# Patient Record
Sex: Female | Born: 1960 | Race: White | Hispanic: No | Marital: Married | Smoking: Never smoker
Health system: Western US, Academic
[De-identification: ages and names within clinical notes are randomized; demographics above are authoritative.]

## PROBLEM LIST (undated history)

## (undated) DIAGNOSIS — D649 Anemia, unspecified: Secondary | ICD-10-CM

## (undated) DIAGNOSIS — M5137 Other intervertebral disc degeneration, lumbosacral region: Secondary | ICD-10-CM

## (undated) DIAGNOSIS — K219 Gastro-esophageal reflux disease without esophagitis: Secondary | ICD-10-CM

## (undated) DIAGNOSIS — M1990 Unspecified osteoarthritis, unspecified site: Secondary | ICD-10-CM

## (undated) DIAGNOSIS — J45909 Unspecified asthma, uncomplicated: Secondary | ICD-10-CM

## (undated) DIAGNOSIS — Z9889 Other specified postprocedural states: Secondary | ICD-10-CM

## (undated) DIAGNOSIS — E282 Polycystic ovarian syndrome: Secondary | ICD-10-CM

## (undated) DIAGNOSIS — M51379 Other intervertebral disc degeneration, lumbosacral region without mention of lumbar back pain or lower extremity pain: Secondary | ICD-10-CM

## (undated) DIAGNOSIS — T85192S Other mechanical complication of implanted electronic neurostimulator (electrode) of spinal cord, sequela: Secondary | ICD-10-CM

## (undated) DIAGNOSIS — J189 Pneumonia, unspecified organism: Secondary | ICD-10-CM

## (undated) DIAGNOSIS — N200 Calculus of kidney: Secondary | ICD-10-CM

## (undated) DIAGNOSIS — Z87442 Personal history of urinary calculi: Secondary | ICD-10-CM

## (undated) DIAGNOSIS — N289 Disorder of kidney and ureter, unspecified: Secondary | ICD-10-CM

## (undated) DIAGNOSIS — I10 Essential (primary) hypertension: Secondary | ICD-10-CM

## (undated) DIAGNOSIS — H35712 Central serous chorioretinopathy, left eye: Secondary | ICD-10-CM

## (undated) DIAGNOSIS — R011 Cardiac murmur, unspecified: Secondary | ICD-10-CM

## (undated) DIAGNOSIS — M1712 Unilateral primary osteoarthritis, left knee: Secondary | ICD-10-CM

## (undated) DIAGNOSIS — G500 Trigeminal neuralgia: Secondary | ICD-10-CM

## (undated) DIAGNOSIS — Z8489 Family history of other specified conditions: Secondary | ICD-10-CM

## (undated) DIAGNOSIS — D509 Iron deficiency anemia, unspecified: Secondary | ICD-10-CM

## (undated) DIAGNOSIS — R112 Nausea with vomiting, unspecified: Secondary | ICD-10-CM

## (undated) DIAGNOSIS — E119 Type 2 diabetes mellitus without complications: Secondary | ICD-10-CM

## (undated) DIAGNOSIS — R7303 Prediabetes: Secondary | ICD-10-CM

## (undated) DIAGNOSIS — M47816 Spondylosis without myelopathy or radiculopathy, lumbar region: Secondary | ICD-10-CM

## (undated) DIAGNOSIS — E079 Disorder of thyroid, unspecified: Secondary | ICD-10-CM

## (undated) DIAGNOSIS — Q615 Medullary cystic kidney: Secondary | ICD-10-CM

## (undated) DIAGNOSIS — E039 Hypothyroidism, unspecified: Secondary | ICD-10-CM

## (undated) DIAGNOSIS — Z961 Presence of intraocular lens: Secondary | ICD-10-CM

## (undated) DIAGNOSIS — M48061 Spinal stenosis, lumbar region without neurogenic claudication: Secondary | ICD-10-CM

## (undated) DIAGNOSIS — R0600 Dyspnea, unspecified: Secondary | ICD-10-CM

## (undated) DIAGNOSIS — J309 Allergic rhinitis, unspecified: Secondary | ICD-10-CM

## (undated) DIAGNOSIS — N390 Urinary tract infection, site not specified: Secondary | ICD-10-CM

## (undated) DIAGNOSIS — I82409 Acute embolism and thrombosis of unspecified deep veins of unspecified lower extremity: Secondary | ICD-10-CM

## (undated) HISTORY — DX: Acute embolism and thrombosis of unspecified deep veins of unspecified lower extremity (CMS-HCC): I82.409

## (undated) HISTORY — PX: OSTEOTOMY: SHX137

## (undated) HISTORY — DX: Urinary tract infection, site not specified: N39.0

## (undated) HISTORY — DX: Allergic rhinitis, unspecified: J30.9

## (undated) HISTORY — PX: ANTERIOR CRUCIATE LIGAMENT REPAIR: SHX115

## (undated) HISTORY — DX: Essential (primary) hypertension: I10

## (undated) HISTORY — PX: OTHER PROCEDURE: U1053

## (undated) HISTORY — PX: HYSTERECTOMY: SHX81

## (undated) HISTORY — DX: Calculus of kidney: N20.0

## (undated) HISTORY — DX: Medullary cystic kidney: Q61.5

## (undated) HISTORY — DX: Disorder of thyroid, unspecified: E07.9

## (undated) HISTORY — DX: Unspecified asthma, uncomplicated: J45.909

## (undated) HISTORY — DX: Pneumonia, unspecified organism: J18.9

## (undated) HISTORY — PX: KNEE SURGERY: SHX244

## (undated) HISTORY — DX: Trigeminal neuralgia: G50.0

## (undated) HISTORY — PX: SHOULDER SURGERY: SHX246

## (undated) HISTORY — PX: KNEE ARTHROSCOPY W/ ACL RECONSTRUCTION: SHX1858

## (undated) HISTORY — PX: FEMORAL OSTEOTOMY W/ APPLICATION EXTERNAL FIXATOR: SHX1587

## (undated) HISTORY — PX: APPENDECTOMY: SHX54

## (undated) HISTORY — PX: ABDOMINAL HYSTERECTOMY: SHX81

## (undated) HISTORY — PX: LITHOTRIPSY: SUR834

## (undated) HISTORY — PX: CATARACT EXTRACTION: SUR2

## (undated) HISTORY — PX: ESOPHAGOGASTRODUODENOSCOPY: SHX1529

## (undated) HISTORY — PX: ROTATOR CUFF REPAIR: SHX139

## (undated) HISTORY — PX: OTHER SURGICAL HISTORY: SHX169

## (undated) MED ORDER — ONDANSETRON HCL 4 MG/2ML IV SOLN
4.00 mg | Freq: Four times a day (QID) | INTRAMUSCULAR | Status: AC | PRN
Start: 2016-03-30 — End: ?

## (undated) MED ORDER — LACTATED RINGERS IV SOLN
INTRAVENOUS | Status: AC
Start: 2016-03-25 — End: ?

## (undated) MED ORDER — CARBAMAZEPINE 200 MG OR TABS
200.00 mg | ORAL_TABLET | Freq: Two times a day (BID) | ORAL | 0 refills | Status: AC
Start: 2016-11-23 — End: ?

## (undated) MED ORDER — LEVOTHYROXINE SODIUM 50 MCG OR TABS
ORAL_TABLET | ORAL | 0 refills | Status: AC
Start: 2018-02-01 — End: ?

## (undated) MED ORDER — LIDOCAINE HCL (PF) 1 % IJ SOLN
0.10 mL | INTRAMUSCULAR | Status: AC | PRN
Start: 2016-03-25 — End: ?

## (undated) MED ORDER — LEVOTHYROXINE SODIUM 50 MCG OR TABS
50.00 ug | ORAL_TABLET | Freq: Every day | ORAL | 0 refills | Status: AC
Start: 2016-11-23 — End: ?

## (undated) MED ORDER — CARBAMAZEPINE 200 MG OR TABS
ORAL_TABLET | ORAL | 1 refills | Status: AC
Start: 2019-11-16 — End: ?

## (undated) MED ORDER — CARBAMAZEPINE 200 MG OR TABS
ORAL_TABLET | ORAL | 0 refills | Status: AC
Start: 2018-03-28 — End: ?

## (undated) MED ORDER — LOSARTAN POTASSIUM-HCTZ 100-25 MG OR TABS
ORAL_TABLET | ORAL | 0 refills | Status: AC
Start: 2017-01-19 — End: ?

## (undated) MED ORDER — METFORMIN HCL 500 MG OR TB24
1000.00 mg | ORAL_TABLET | Freq: Every day | ORAL | 0 refills | Status: AC
Start: 2018-02-18 — End: ?

## (undated) MED ORDER — METFORMIN HCL 500 MG OR TB24
ORAL_TABLET | ORAL | Status: AC
Start: 2019-11-13 — End: ?

## (undated) MED ORDER — HYDROCHLOROTHIAZIDE 25 MG OR TABS
ORAL_TABLET | ORAL | 0 refills | Status: AC
Start: 2016-11-16 — End: ?

## (undated) MED ORDER — HYDROCHLOROTHIAZIDE 25 MG OR TABS
ORAL_TABLET | ORAL | 2 refills | Status: AC
Start: 2018-09-19 — End: ?

## (undated) MED ORDER — LEVOTHYROXINE SODIUM 50 MCG OR TABS
ORAL_TABLET | ORAL | 0 refills | Status: AC
Start: 2016-11-16 — End: ?

## (undated) MED ORDER — ESTRADIOL 0.05 MG/24HR TD PTTW
1.00 | MEDICATED_PATCH | TRANSDERMAL | 0 refills | Status: AC
Start: 2017-08-30 — End: ?

## (undated) MED ORDER — METFORMIN HCL 500 MG OR TB24
1000.00 mg | ORAL_TABLET | Freq: Every day | ORAL | 0 refills | Status: AC
Start: 2016-11-23 — End: ?

## (undated) MED ORDER — VIVELLE-DOT 0.05 MG/24HR TD PTTW
MEDICATED_PATCH | TRANSDERMAL | 0 refills | Status: AC
Start: 2018-06-14 — End: ?

## (undated) MED ORDER — AMLODIPINE 10 MG OR TABS
ORAL_TABLET | ORAL | 0 refills | Status: AC
Start: 2017-12-13 — End: ?

## (undated) MED ORDER — METFORMIN HCL 500 MG OR TB24
ORAL_TABLET | ORAL | 0 refills | Status: AC
Start: 2018-02-01 — End: ?

## (undated) MED ORDER — METFORMIN HCL 500 MG OR TB24
ORAL_TABLET | ORAL | 0 refills | Status: AC
Start: 2016-11-16 — End: ?

---
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
## 1999-10-21 ENCOUNTER — Ambulatory Visit: Payer: Self-pay | Admitting: Gastroenterology

## 2006-04-20 DIAGNOSIS — I82409 Acute embolism and thrombosis of unspecified deep veins of unspecified lower extremity: Secondary | ICD-10-CM

## 2006-04-20 HISTORY — DX: Acute embolism and thrombosis of unspecified deep veins of unspecified lower extremity: I82.409

## 2012-03-28 DIAGNOSIS — G500 Trigeminal neuralgia: Secondary | ICD-10-CM | POA: Insufficient documentation

## 2012-03-28 DIAGNOSIS — H5710 Ocular pain, unspecified eye: Secondary | ICD-10-CM | POA: Insufficient documentation

## 2012-03-28 DIAGNOSIS — H04123 Dry eye syndrome of bilateral lacrimal glands: Secondary | ICD-10-CM | POA: Insufficient documentation

## 2015-04-21 HISTORY — PX: OSTEOTOMY: SHX137

## 2015-11-04 DIAGNOSIS — M179 Osteoarthritis of knee, unspecified: Secondary | ICD-10-CM | POA: Insufficient documentation

## 2015-11-06 DIAGNOSIS — M1711 Unilateral primary osteoarthritis, right knee: Secondary | ICD-10-CM | POA: Insufficient documentation

## 2016-01-30 ENCOUNTER — Other Ambulatory Visit: Payer: Self-pay

## 2016-02-19 ENCOUNTER — Encounter (HOSPITAL_BASED_OUTPATIENT_CLINIC_OR_DEPARTMENT_OTHER): Payer: Self-pay | Admitting: Urology

## 2016-02-19 ENCOUNTER — Ambulatory Visit: Payer: BC Managed Care – PPO | Attending: Urology | Admitting: Urology

## 2016-02-19 DIAGNOSIS — N200 Calculus of kidney: Principal | ICD-10-CM | POA: Insufficient documentation

## 2016-02-19 LAB — UA, CHEM ONLY POCT
Bilirubin: NEGATIVE
Blood: NEGATIVE
Glucose: NEGATIVE
Ketones: NEGATIVE
Leuk Esterase: NEGATIVE
Nitrite: NEGATIVE
Protein: NEGATIVE
Specific Gravity: 1.02 (ref 1.002–1.030)
Urobilinogen: 0.2 (ref 0.2–1)
pH: 6 (ref 5.0–8.0)

## 2016-02-19 MED ORDER — LEVOTHYROXINE SODIUM PO
50.00 ug | Freq: Every day | ORAL | Status: DC
Start: ? — End: 2016-08-17

## 2016-02-19 MED ORDER — METFORMIN HCL PO
200.00 mg | Freq: Every day | ORAL | Status: DC
Start: ? — End: 2016-11-20

## 2016-02-19 MED ORDER — AMLODIPINE 5 MG OR TABS
5.00 mg | ORAL_TABLET | Freq: Every day | ORAL | Status: DC
Start: ? — End: 2017-11-15

## 2016-02-19 NOTE — Progress Notes (Signed)
UROLOGY NEW PATIENT STONE NOTE       Assessment/Plan:  Hx of medullary sponge kidney  Left flank pain    I have personally reviewed the images, radiology report, labs, and old records. I reviewed her outside CT but could not see all the images. Upload outside CT to [facility]. My cursory review does not suggest that PCNL is a good option due to tiny multiple stone burden. I will discuss w Dr. Shawn Stall [phone] and call pt back. Will also have her see my Dietician to review her diet and repeat Litholink (24 hour urine test). Fu w me 3 months    Plan for obtaining previous imaging, 24 hour urine collection and other.    Chief Complaint  - Nephrolithiasis     History of Present Illness  Melanie Walsh is a 55 year old female who is referred to me by Ellwood Dense here for evaluation and possible treatment of a kidney stone, which has been ongoing since 12/20/2015. She is currently symptomatic.    Pt has had long hx of stones and is being referred to me by Dr. Shawn Stall from [facility] for possible PCNL. Pt had multiple URS by him last Nov but stones recurred. He is concerned that ureters are becoming scarred.    She had Litholink (24 hour urine test) in past and was told that she has hyperoxaluria w dietary recs.  She admits to Vit D due to inability to tolerate dairy prodcuts (GI upset) but she can eat Yogurt.  Also takes [place] 1000mg  daily for health maintenance reasons.    Recently passed some stones from left side 2 weekends ago but cont to have pain. Denies F/C/N/V/Gross hematuria.    The stone was diagnosed upon work up for left flank pain. The patient has had CT abdomen/pelvis as part of her work up. not applicable - no medications were given was prescribed for medical expulsive therapy. The start date was NA.     Stone Passage/Treatment  - Outcome: reports that the stone passed.  - Days until outcome: N/A - not applicable.    - Treatment for this episode: No surgical intervention for this stone episode was  performed.    Stone History   - Prior Stone: stones since age 87-30  - Number of Stone Episodes: >6   - Laterality: experienced stones on both sides  - Prior Surgery: right ureteral stent placement, right ureteroscopy, right SWL, left ureteral stent placement, left ureteroscopy and left SWL.    Risk Factors  - Bariatric Surgery: no   - Urinary Reconstructive Surgery: no previous history of urinary reconstruction  - Anatomical Anomalies: no urinary tract anomaly.   - Family History: Yes     No flowsheet data found.    Allergies  Allergies   Allergen Reactions   . Latex Rash   . Morphine Shortness of Breath and Swelling   . Sulfa Drugs Rash     Medications    Current Outpatient Prescriptions:   .  amLODIPINE (NORVASC) 5 MG tablet, Take 5 mg by mouth daily., Disp: , Rfl:   .  LEVOTHYROXINE SODIUM PO, , Disp: , Rfl:   .  METFORMIN HCL PO, Take 200 mg by mouth daily., Disp: , Rfl:   Past Medical History  History reviewed. No pertinent past medical history.  Past Surgical History  No past surgical history on file.  Family History  Family History   Problem Relation Age of Onset   . Cancer Father    .  Heart Disease Father    . Hypertension Father    . Cancer Maternal Grandfather    . Cancer Paternal Grandmother    . Stroke Paternal Grandmother    . Cancer Paternal Grandfather    . Diabetes Paternal Grandfather      Social History  Social History     Social History   . Marital status: Single     Spouse name: N/A   . Number of children: N/A   . Years of education: N/A     Social History Main Topics   . Smoking status: Never Smoker   . Smokeless tobacco: None   . Alcohol use None   . Drug use: None   . Sexual activity: Not Asked     Social Activities of Daily Living Present   . None     Social History Narrative   . None       ROS: See pertinent positives and negatives in HPI. All other systems reviewed and found negative.    Physical Exam  Vitals:    02/19/16 0911   BP Patient Position: Sitting     GENERAL: The patient is an  alert, cooperative female in no acute distress.   HEENT: normalocephalic/atraumatic  NECK: midline trachea. No jugular venous distention  PULM: no evidence of labored breathing  GI: soft, NT, ND, no masses.  No hepatosplenomegaly  BACK:mild Left CVAT  EXTREMITIES: Joints are normal without redness or swelling.   SKIN: There is no edema or cyanosis.   NEURO: no motor or sensory deficits.  Alert and Oriented x 3      Sherrian Divers, MD

## 2016-02-21 ENCOUNTER — Telehealth (HOSPITAL_BASED_OUTPATIENT_CLINIC_OR_DEPARTMENT_OTHER): Payer: Self-pay | Admitting: Urology

## 2016-02-21 NOTE — Telephone Encounter (Signed)
I spoke w Dr. Shawn Stall yesterday that I would review images once they are uploaded to [facility].

## 2016-02-21 NOTE — Telephone Encounter (Signed)
I reviewed uploaded CT and she has 4 small right renal stones, 2 small left renal stones and a small Left UVJ stone.    Need to discuss w Dr Shawn Stall and then pt.

## 2016-02-21 NOTE — Telephone Encounter (Signed)
-----   Message from [facility] sent at 02/20/2016  2:25 PM PDT -----  Message left for Dr. Shawn Stall :)    Thanks,  Amado Nash   ----- Message -----     From: Sherrian Divers, MD     Sent: 02/19/2016  10:36 AM       To: Latricia Heft    Please see my note and give primary Urologist from [place] my cell.  I need to discuss case w him

## 2016-02-24 ENCOUNTER — Telehealth (HOSPITAL_BASED_OUTPATIENT_CLINIC_OR_DEPARTMENT_OTHER): Payer: Self-pay | Admitting: Urology

## 2016-02-24 NOTE — Telephone Encounter (Signed)
Routing to MD to advise.

## 2016-02-24 NOTE — Telephone Encounter (Signed)
Patient is calling stating that Dr. Reather Converse talked with her other doctor, Dr. Maureen Chatters, and they decided Dr. Reather Converse would be the one to preform the surgery on patient. Patient would like to remind Dr. Reather Converse that her goal is to do the surgery before the first of the year because she has already met her deductible for the year. Patient advised that Dr. Reather Converse has not placed the surgery order in her chart, but that once he did that, our surgery coordinator will be giving her a call to schedule. Please contact patient to discuss/update: [phone].

## 2016-02-25 ENCOUNTER — Telehealth (HOSPITAL_BASED_OUTPATIENT_CLINIC_OR_DEPARTMENT_OTHER): Payer: Self-pay | Admitting: Urology

## 2016-02-25 NOTE — Telephone Encounter (Signed)
Mailed out CT disc

## 2016-03-02 ENCOUNTER — Telehealth (HOSPITAL_BASED_OUTPATIENT_CLINIC_OR_DEPARTMENT_OTHER): Payer: Self-pay | Admitting: Urology

## 2016-03-02 NOTE — Telephone Encounter (Signed)
Patient calling regarding previous message of 02/24/16. Pt is requesting a return call to discuss surgery date. Please advise pt [phone]. Please contact pt after 2pm

## 2016-03-04 ENCOUNTER — Ambulatory Visit (HOSPITAL_BASED_OUTPATIENT_CLINIC_OR_DEPARTMENT_OTHER): Payer: BC Managed Care – PPO | Admitting: Registered"

## 2016-03-20 ENCOUNTER — Encounter (INDEPENDENT_AMBULATORY_CARE_PROVIDER_SITE_OTHER): Payer: Self-pay | Admitting: Urology

## 2016-03-20 ENCOUNTER — Other Ambulatory Visit: Payer: BC Managed Care – PPO | Attending: Urology | Admitting: Urology

## 2016-03-20 VITALS — BP 130/79 | HR 96 | Temp 99.0°F | Resp 16 | Ht 64.0 in | Wt 165.0 lb

## 2016-03-20 DIAGNOSIS — N200 Calculus of kidney: Principal | ICD-10-CM | POA: Insufficient documentation

## 2016-03-20 MED ORDER — VITAMIN D PO: ORAL | Status: AC

## 2016-03-20 MED ORDER — LEVALBUTEROL TARTRATE 45 MCG/ACT IN AERO
2.00 | INHALATION_SPRAY | Freq: Four times a day (QID) | RESPIRATORY_TRACT | Status: DC | PRN
Start: ? — End: 2016-10-26

## 2016-03-20 MED ORDER — HYDROCHLOROTHIAZIDE 50 MG OR TABS
50.00 mg | ORAL_TABLET | Freq: Every day | ORAL | Status: DC
Start: ? — End: 2016-08-17

## 2016-03-20 MED ORDER — ONE DAILY CALCIUM/IRON PO
ORAL | Status: DC
Start: ? — End: 2016-11-20

## 2016-03-20 NOTE — Progress Notes (Signed)
Urology Follow-up Stone Note    Assessment/Plan:  Hx of medullary sponge kidney  Left distal ureteral calculus, passed  Right flank pain    I have personally reviewed the images, radiology report, labs, and old records. Plan for Bilateral URS/litho.    Will use analog scope to potentially avoid stents.    Pt understands that she likely require perc nephrostomy tubes should she have post op renal colic (if no stent placed). She is fine w that.    Will need metabolic eval eventually.    Start Flomax 7days prior to surgery and send Ucx.  Explained the risks, benefits and alternatives (SWL/PNL). The risks include but are not limited to infection, urosepsis, bleeding, transfusion, ureteral perforation with need for nephrostomy tube, open conversion, need for repeat or ancillary procedure, ureteral stricture, cardiac events/MI, DVT/PE, CVA/stroke, pneumonia, and death.    surgery.     Chief Complaint  - Nephrolithiasis     HPI  Melanie Walsh is a 55 year old female with a history of urolithiasis who presents for routine follow-up. She passed her left stone in the ED but now has right flank pain.    Denies F/C/N/V/Gross hematuria    I spoke w Dr. Shawn Stall who is ok w my performing URS.      She reports one symptomatic stone event since her last visit. These were associated with right flank pain. Since her last visit, Jeneane underwent no interval procedures for stones. At their last visit, the plan was surgery.     [place] was started on no new medications at her  last visit. The patient reports n/a - no new medications were prescribed prescribed. Dietary changes recommended at the patient's last visit were No diet change. Dietary change adherence: She n/a - no dietary changes were recommended.    No flowsheet data found.     Allergies   Allergen Reactions   . Adhesive Tape Rash   . Latex Rash   . Morphine Shortness of Breath and Swelling   . Sulfa Drugs Rash     Current Outpatient Prescriptions   Medication Sig Dispense Refill   .  amLODIPINE (NORVASC) 5 MG tablet Take 5 mg by mouth daily.     . Cholecalciferol (VITAMIN D PO)      . hydrochlorothiazide (HYDRODIURIL) 50 MG tablet Take 50 mg by mouth daily.     Marland Kitchen levalbuterol (XOPENEX) 45 MCG/ACT inhaler Inhale 2 puffs by mouth every 6 hours as needed for Wheezing.     Marland Kitchen LEVOTHYROXINE SODIUM PO      . METFORMIN HCL PO Take 200 mg by mouth daily.     . Multiple Vitamins-Minerals (ONE DAILY CALCIUM/IRON PO)        No current facility-administered medications for this visit.      Past Medical History  History reviewed. No pertinent past medical history.  Past Surgical History  No past surgical history on file.  Family History  Family History   Problem Relation Age of Onset   . Cancer Father    . Heart Disease Father    . Hypertension Father    . Cancer Maternal Grandfather    . Cancer Paternal Grandmother    . Stroke Paternal Grandmother    . Cancer Paternal Grandfather    . Diabetes Paternal Grandfather      Social History  Social History     Social History   . Marital status: Single     Spouse name: N/A   .  Number of children: N/A   . Years of education: N/A     Social History Main Topics   . Smoking status: Never Smoker   . Smokeless tobacco: Never Used   . Alcohol use None   . Drug use: None   . Sexual activity: Not Asked     Social Activities of Daily Living Present   . None     Social History Narrative       There is no problem list on file for this patient.      ROS See pertinent positives and negatives in HPI. All other systems reviewed and found negative.     Objective  Physical Exam  Vitals:    03/20/16 1420   BP: 130/79   BP Location: Left arm   BP Patient Position: Sitting   BP cuff size: Regular   Pulse: 96   Resp: 16   Temp: 99 F (37.2 C)   TempSrc: Oral   Weight: 74.8 kg (165 lb)   Height: 5\' 4"  (1.626 m)     GENERAL: The patient is an alert, cooperative female in no acute distress.   HEENT: normalocephalic/atraumatic  NECK: midline trachea. No jugular venous distention  PULM: no  evidence of labored breathing  BACK: Right CVAT  EXTREMITIES: Joints are normal without redness or swelling.   SKIN: There is no edema or cyanosis.   NEURO: no motor or sensory deficits.  Alert and Oriented x 3    Sherrian Divers, MD

## 2016-03-20 NOTE — Telephone Encounter (Signed)
..    Surgery name: L URS    Patient has been contacted at Ph. #            . Spoke to patient and confirmed all surgery dates below. Surgical letter, bowel prep, and ASA forms have been emailed to ___ / mailed to address listed below per patients request.    Surgery Date:12/07   Check in time: 7:00am    [facility]: 12/06 phone     Is Medical Clearance needed?NO    Surgical Consent : has been signed and scanned into media    Will send message to MD and RN to review orders in EPIC to make sure appropriate orders are entered prior to pt's surgery. RN to also confirm with pt that they are aware of request.    Auth:  Will request as time gets closer per auth teams request.     I have informed the patient to please discontinue taking any aspirin/blood thinning medication and/or anti-inflammatory drugs at lease 7 days before their scheduled procedure.     Is the patient taking any aspirin/blood thinning medication that is prescribed under a physician's instructions? Yes/No       ASPIRIN AND IBUPROFEN CAUTION SHEET  FOR SURGERY PATIENTS    For 7 days prior to surgery, do not take any medication containing aspirin or ibuprofen.  Please refer to the list below for some of the medications that contain aspirin and/or ibuprofen.  If it is necessary for you to take any medication, please use Tylenol or acetaminophen substitutes.    Advil    Gingkoba  Alka-Seltzer   Liquiprin  Alleve    Measurin  Anacin     Midol  APC     Motrin  ASA compound    Norgesic  Ascriptin    Novahistine with APC  Aspergum    Nuprin  Bufferin    PAC  CAMA    Percodan  Capron capsules   Phenaphen  Contact    Phensol  Cope     Plavix  Coricidin   Robaxisal  Counterpain    Sal-Fayne  Daprisal    Stanback  Darvon compound   Super Anahist  Dolene compound   Synalogos  Dristan     Talwin compound  Ecotrin     Trigesic  Edrisal     Triphen  Equagesic    Trilisate  Excedrin    Triaminic  Femcaps    Vanquish  Fiorinal    Vitamin E (or any other multi-vitamin)  Gingkoba      Zactirin    This list does not include every medication that contains aspirin or ibuprofen.  Before taking any medication prior to or after surgery, please read the label carefully for the active ingredients aspirin, salicylates, and/or ibuprofen.  If the medication contains these ingredients do not use.  Please inform your physician of all medications you are taking, including non-prescription medications.    PRE-OPERATIVE NPO BOWEL PREPARATION    After 12:00 pm (noon) on the day before surgery, please drink only CLEAR LIQUIDS (NO dairy products) such as broth, plain jello, cranberry juice, apple juice, tea or water.    As soon as you wake up on the [place], please have a bowel movement if possible before you leave for the hospital.     Take your regular medications on the [place] (except Aspirin, Coumadin or Plavix).  If you regularly take any medication in the morning, especially insulin or other  oral medication for diabetes you must discuss this with your doctor.  Please make sure that your doctor approves all the medications that you are taking.    DO NOT eat or drink anything after midnight on the night before your surgery.    REMEMBER: You cannot take aspirin or other similar medications (Motrin, Ibuprofen, Naprosen) or blood thinners (coumadin, plavix) for 1 week prior to this procedure.      If you have any questions about this Bowel Preparation, please call  The   [facility] Nurse Case Manager at # [phone]      PRE-OPERATIVE INSTRUCTIONS     If there is any change in your surgery time, you will be contacted by the operating room scheduling team after 5:00 p.m. the day before your surgery   1. For 7 days prior to surgery, please do not take any medications that contain aspirin, ibuprofen, or any non-steroidal anti-inflammatory medications.  If you must take pain medication, please take Tylenol or an acetaminophen substitute.  2. If you take prescription medications, check  with the Anesthesiologist or the nurse in the Evaluation Center about whether you should take your medications on the day of surgery, especially blood pressure and heart medications.    3. If you smoke, do not smoke for at least 24 hours prior to surgery.  Please be aware that [facility] is a non-smoking facility.  4. Wear comfortable, loose clothing to the hospital.  5. Leave all valuables at home.  This includes jewelry, credit cards, money (except for copayment for discharge medications).  6. ALL jewelry must be removed, including rings, earrings, necklaces, navel rings, etc.  7. If you wear contact lenses or glasses, bring a case for them.  Contact lenses must be removed prior to surgery.  8. Bring your insurance carrier cards and picture I.D. with you.  If your insurance plan is accepted at our [place] and you would like to have your discharge prescriptions filled there, you will need to bring money for your co-payment.  9. Please arrange for transportation home after your surgery and hospital stay. A taxi or shuttle bus is not acceptable! You will need to be accompanied home by a responsible adult.  You may want to have a pillow and/or blanket in the car for the ride home.  10. If you will be staying in the hospital, bring a robe, slippers and any items for grooming that you may need during your stay.   11. Please call your doctor if you have any questions regarding your surgery or if you develop any signs or symptoms of illness (fever, runny nose, cough, sore throat).    NOTE:   Your surgery may have to be cancelled if you:    1) You do not follow the fasting/bowel prep guidelines    2) You arrive late for check in at the hospital  3) You have not arranged for a responsible adult to accompany you home

## 2016-03-22 LAB — URINE CULTURE: Urine Culture Result: NO GROWTH

## 2016-03-25 ENCOUNTER — Ambulatory Visit (HOSPITAL_BASED_OUTPATIENT_CLINIC_OR_DEPARTMENT_OTHER): Payer: BC Managed Care – PPO | Admitting: Family

## 2016-03-25 ENCOUNTER — Encounter (HOSPITAL_BASED_OUTPATIENT_CLINIC_OR_DEPARTMENT_OTHER): Payer: Self-pay | Admitting: Family

## 2016-03-25 ENCOUNTER — Ambulatory Visit (HOSPITAL_BASED_OUTPATIENT_CLINIC_OR_DEPARTMENT_OTHER): Payer: BC Managed Care – PPO | Admitting: Registered"

## 2016-03-25 DIAGNOSIS — Z01818 Encounter for other preprocedural examination: Principal | ICD-10-CM

## 2016-03-25 MED ORDER — FLUTICASONE-SALMETEROL 100-50 MCG/DOSE IN MISC
1.00 | INHALATION_SPRAY | Freq: Two times a day (BID) | RESPIRATORY_TRACT | Status: DC
Start: ? — End: 2017-04-21

## 2016-03-25 NOTE — Anesthesia Preprocedure Evaluation (Addendum)
ANESTHESIA PRE-OPERATIVE EVALUATION    Patient Information    Name: Melanie Walsh    MRN: [id number]    DOB: 05/08/1960    Age: 55 year old    Sex: female  Procedure(s):  LT URETEROSCOPY STONE EXTRACTION       There were no vitals taken for this visit.        Primary language spoken:  English    ROS/Medical History:    Patient summary reviewed    General Review & History of Anesthetic Complications:    - negative ROS    Pt has had GA before; no problems      TELEPHONE INTERVIEW-NEEDS PHYSICAL Cardiovascular:    Exercise tolerance: >4 METS (Treadmill 3 x wk 1 hr,     Denies CP SOB with physical exertion  )  (+)    hypertension           Pulmonary:     (+) asthma ( stable on inhalers, no admission or intubations), , , , ,    Hematology/Oncology:       ROS Comments  Hx of DVT 2007 -resolved     Neuro/Psych:        (+) neuromuscular disease (Tigeminal neuralgia,  left side no numbenss ),      Infectious Disease:   Negative ROS         Endo/Other:      (+) hypothyroidism ( replacement. last dose change 2010),     ROS Comments: Pre DM- on Metformin A1c ordered dos   GI/Hepatic:     (+) GERD ( PRN OTC) well controlled,    Renal:           ROS comments Kidney stone- procedure pending    Hx Medullary Kidney disease       Pregnancy History:             Pre Anesthesia Testing (PCC/CPC) notes/comments:    [facility] Test & records reviewed by [facility] provider    :                    Physical Exam    Airway:  Inter-inciser distance > 4 cm  Prognanth Able    Mallampati: II  Neck ROM: full  TM distance: 5-6 cm  Short thick neck: No        Cardiovascular:  - cardiovascular exam normal         Pulmonary:  - pulmonary exam normal           Neuro/Neck/Skeletal/Skin:  - [place] ANE PHYS EXAM NEGATIVE ROS SKIN SKELETAL NEURO NECK          Dental:      Abdominal:   - normal exam         Additional Clinical Notes:               Last  OSA (STOP BANG) Score:  No Data Recorded    Last OSA  (STOP) Score for   No Data Recorded                 No past medical  history on file.  No past surgical history on file.  Social History   Substance Use Topics   . Smoking status: Never Smoker   . Smokeless tobacco: Never Used   . Alcohol use Not on file       Current Outpatient Prescriptions   Medication Sig Dispense Refill   . amLODIPINE (NORVASC) 5 MG tablet Take  5 mg by mouth daily.     . Cholecalciferol (VITAMIN D PO)      . hydrochlorothiazide (HYDRODIURIL) 50 MG tablet Take 50 mg by mouth daily.     Marland Kitchen levalbuterol (XOPENEX) 45 MCG/ACT inhaler Inhale 2 puffs by mouth every 6 hours as needed for Wheezing.     Marland Kitchen LEVOTHYROXINE SODIUM PO      . METFORMIN HCL PO Take 200 mg by mouth daily.     . Multiple Vitamins-Minerals (ONE DAILY CALCIUM/IRON PO)        No current facility-administered medications for this visit.      Allergies   Allergen Reactions   . Adhesive Tape Rash   . Latex Rash   . Morphine Shortness of Breath and Swelling   . Sulfa Drugs Rash       Labs and Other Data  No results found for: NA, K, CL, BICARB, BUN, CREAT, GLU, [place]  No results found for: AST, ALT, GGT, LDH, ALK, TP, ALB, TBILI, DBILI  No results found for: WBC, RBC, HGB, HCT, MCV, MCHC, RDW, PLT, MPV, SEG, LYMPHS, MONOS, EOS, BASOS  No results found for: INR, PTT  No results found for: ARTPH, ARTPO2, ARTPCO2    Anesthesia Plan:  Risks and Benefits of Anesthesia  I personally examined the patient immediately prior to the anesthetic and reviewed the pertinent medical history, drug and allergy history, laboratory and imaging studies and consultations. I have determined that the patient has had adequate assessment and testing.    Anesthetic techniques, invasive monitors, anesthetic drugs for induction, maintenance and post-operative analgesia, risks and alternatives have been explained to the patient and/or patient's representatives.    I have prescribed the anesthetic plan:         Planned anesthesia method: General         ASA 2 (Mild systemic disease)     Potential anesthesia problems identified and risks  including but not limited to the following were discussed with patient and/or patient's representative: Adverse or allergic drug reaction, Dental injury or sore throat, Nerve injury and Recall    Planned monitoring method: Routine monitoring    Informed Consent:  Anesthetic plan and risks discussed with Patient.    Plan discussed with CRNA, Surgeon and Attending.

## 2016-03-25 NOTE — Patient Instructions (Addendum)
PREOPERATIVE SURGICAL INFORMATION TELEPHONE INTERVIEW    Your surgery is currently scheduled at [facility] hospital/facility/department on 03/26/16  With a planned report time of 10:50 AM      [facility], [place], [place], [place], [place] [address]   Please check in at [employer] [facility] [employer], [place], 1st floor.      QUESTIONS  If you have any questions between now and the day of surgery, please do not hesitate to call:     [facility] Preoperative [place]: [place] ARRIVAL TIME:  On the day of your Surgery/Procedure, please arrive at the time and location provided by your Surgeon/PreOp Team.  If you have any questions regarding your arrival time, please call:     Preoperative Surgical Admissions at [facility]: [phone]        MEDICATION INSTRUCTIONS:     MEDICATIONS TO STOP 7 DAYS BEFORE SURGERY/PROCEDURE:   PLEASE HOLD ASPIRIN AND ALL NSAIDS (non-steroidal anti-inflammatory drugs) or similar drugs SUCH AS advil, aleve, motrin, ibuprofen, relafen, lodine, feldene, Diclofenac, voltaren, indomethacin, naproxen, celebrex, Mobic.    Tylenol (Acetaminophen) products are safe if needed   PLEASE HOLD: vitamins, supplements, herbs & fish oil.      REGULAR PRESCRIPTION MEDICATIONS:   Regular prescription medications SHOULD be taken on the day of surgery with sips of water EXCEPT METFORMIN   AFTER YOUR VISIT WITH [place], IF YOU START TAKING A NEW MEDICATION BEFORE SURGERY, PLEASE CALL us TO MAKE SURE IT IS SAFE TO TAKE & WON'T EFFECT YOUR SURGERY.      TO DO LIST:     SLEEP APNEA: DO NOT use sedatives or drink alcohol while on pain medications & IF you use a CPAP machine bring your mask & Tubing with you the day of surgery.     EATING/DRINKING   PLEASE DO NOT EAT OR DRINK ANYTHING AFTER MIDNIGHT THE NIGHT BEFORE SURGERY.    Preparing for your Surgery:   Please wear clean loose-fitting clothes.   Bring a picture ID and your  insurance card, and be prepared to pay your deductible or co-insurance by cash, check, or credit card when you arrive.   If you are going home after surgery, please make sure to arrange for an ADULT to drive you home. If you do not do so, your surgery may be cancelled.    On The Day of Your Surgery:    Check in at the location mentioned above.   You will meet your Anesthesia and surgery teams before surgery   Once surgery is over, you will wake up in the recovery room, where you will be able to see your friends/family.   Once your time in the recovery room is complete, you will either go home or be admitted as planned.   If you go home, someone will need to stay with you for the first 24 hours after surgery.     Additional information about what to expect before & after surgery is available online at:  [email or web address]    Or by searching "You-tube" for [place] before surgery and [place] after surgery    You medical records are available to you at [email or web address]  Select create account.

## 2016-03-26 ENCOUNTER — Ambulatory Visit (HOSPITAL_COMMUNITY): Payer: BC Managed Care – PPO | Admitting: Family

## 2016-03-26 ENCOUNTER — Encounter (HOSPITAL_COMMUNITY): Admission: RE | Disposition: A | Discharge status: Home Health | Payer: Self-pay | Attending: Urology

## 2016-03-26 ENCOUNTER — Observation Stay
Admission: RE | Admit: 2016-03-26 | Discharge: 2016-03-27 | Disposition: A | Discharge status: Home / Self Care | Payer: BC Managed Care – PPO | Attending: Urology | Admitting: Urology

## 2016-03-26 ENCOUNTER — Ambulatory Visit (HOSPITAL_BASED_OUTPATIENT_CLINIC_OR_DEPARTMENT_OTHER): Payer: BC Managed Care – PPO | Admitting: Certified Registered"

## 2016-03-26 ENCOUNTER — Ambulatory Visit (HOSPITAL_BASED_OUTPATIENT_CLINIC_OR_DEPARTMENT_OTHER): Payer: BC Managed Care – PPO

## 2016-03-26 DIAGNOSIS — N200 Calculus of kidney: Secondary | ICD-10-CM | POA: Insufficient documentation

## 2016-03-26 DIAGNOSIS — J45909 Unspecified asthma, uncomplicated: Secondary | ICD-10-CM | POA: Insufficient documentation

## 2016-03-26 DIAGNOSIS — Z87442 Personal history of urinary calculi: Secondary | ICD-10-CM | POA: Insufficient documentation

## 2016-03-26 DIAGNOSIS — E039 Hypothyroidism, unspecified: Secondary | ICD-10-CM | POA: Insufficient documentation

## 2016-03-26 DIAGNOSIS — Q615 Medullary cystic kidney: Secondary | ICD-10-CM | POA: Insufficient documentation

## 2016-03-26 DIAGNOSIS — I10 Essential (primary) hypertension: Secondary | ICD-10-CM | POA: Insufficient documentation

## 2016-03-26 DIAGNOSIS — Z86718 Personal history of other venous thrombosis and embolism: Secondary | ICD-10-CM | POA: Insufficient documentation

## 2016-03-26 DIAGNOSIS — Z833 Family history of diabetes mellitus: Secondary | ICD-10-CM | POA: Insufficient documentation

## 2016-03-26 LAB — GLUCOSE (POCT): Glucose (POCT): 111 mg/dL — ABNORMAL HIGH (ref 70–99)

## 2016-03-26 SURGERY — REMOVAL, CALCULUS, URETER, URETEROSCOPIC
Site: Ureter | Laterality: Left | Wound class: Class II (Clean Contaminated)

## 2016-03-26 MED ORDER — IOHEXOL 240 MG/ML IJ SOLN
INTRAMUSCULAR | Status: DC | PRN
Start: 2016-03-26 — End: 2016-03-26
  Administered 2016-03-26: 16 mL via INTRAMUSCULAR

## 2016-03-26 MED ORDER — DIPHENHYDRAMINE HCL 50 MG/ML IJ SOLN
12.5000 mg | Freq: Once | INTRAMUSCULAR | Status: DC | PRN
Start: 2016-03-26 — End: 2016-03-26

## 2016-03-26 MED ORDER — SODIUM CHLORIDE 0.9 % IV SOLN
100.0000 mg | INTRAVENOUS | Status: DC | PRN
Start: 2016-03-26 — End: 2016-03-26
  Administered 2016-03-26: 250 mg via INTRAVENOUS

## 2016-03-26 MED ORDER — NALOXONE HCL 0.4 MG/ML IJ SOLN
0.1000 mg | INTRAMUSCULAR | Status: DC | PRN
Start: 2016-03-26 — End: 2016-03-27

## 2016-03-26 MED ORDER — HYDROCHLOROTHIAZIDE 25 MG OR TABS
50.0000 mg | ORAL_TABLET | Freq: Every day | ORAL | Status: DC
Start: 2016-03-27 — End: 2016-03-27

## 2016-03-26 MED ORDER — DIPHENHYDRAMINE HCL 50 MG/ML IJ SOLN
INTRAMUSCULAR | Status: DC | PRN
Start: 2016-03-26 — End: 2016-03-26
  Administered 2016-03-26: 25 mg via INTRAVENOUS

## 2016-03-26 MED ORDER — HYDROMORPHONE HCL 1 MG/ML IJ SOLN
0.5000 mg | INTRAMUSCULAR | Status: DC | PRN
Start: 2016-03-26 — End: 2016-03-26

## 2016-03-26 MED ORDER — FAMOTIDINE 20 MG/2ML IV SOLN
INTRAVENOUS | Status: DC | PRN
Start: 2016-03-26 — End: 2016-03-26
  Administered 2016-03-26: 20 mg via INTRAVENOUS

## 2016-03-26 MED ORDER — ACETAMINOPHEN 10 MG/ML IV SOLN
INTRAVENOUS | Status: DC | PRN
Start: 2016-03-26 — End: 2016-03-26
  Administered 2016-03-26: 1000 mg via INTRAVENOUS

## 2016-03-26 MED ORDER — FENTANYL CITRATE (PF) 100 MCG/2ML IJ SOLN
50.0000 ug | INTRAMUSCULAR | Status: DC | PRN
Start: 2016-03-26 — End: 2016-03-26
  Administered 2016-03-26 (×2): 50 ug via INTRAVENOUS

## 2016-03-26 MED ORDER — ONDANSETRON HCL 4 MG/2ML IV SOLN
4.0000 mg | Freq: Four times a day (QID) | INTRAMUSCULAR | Status: DC | PRN
Start: 2016-03-26 — End: 2016-03-27
  Administered 2016-03-27 (×2): 4 mg via INTRAVENOUS
  Filled 2016-03-26 (×2): qty 2

## 2016-03-26 MED ORDER — TRAMADOL HCL 50 MG OR TABS
50.0000 mg | ORAL_TABLET | Freq: Four times a day (QID) | ORAL | 0 refills | Status: DC | PRN
Start: 2016-03-26 — End: 2016-10-26

## 2016-03-26 MED ORDER — KETOROLAC TROMETHAMINE 15 MG/ML IJ SOLN
15.0000 mg | Freq: Once | INTRAMUSCULAR | Status: DC
Start: 2016-03-26 — End: 2016-03-26

## 2016-03-26 MED ORDER — ALBUTEROL SULFATE 108 (90 BASE) MCG/ACT IN AERS
2.0000 | INHALATION_SPRAY | Freq: Four times a day (QID) | RESPIRATORY_TRACT | Status: DC | PRN
Start: 2016-03-26 — End: 2016-03-27

## 2016-03-26 MED ORDER — OXYCODONE HCL 5 MG OR TABS
5.0000 mg | ORAL_TABLET | ORAL | Status: DC | PRN
Start: 2016-03-26 — End: 2016-03-27

## 2016-03-26 MED ORDER — PROPOFOL IV BOLUS 10 MG/ML
INTRAVENOUS | Status: DC | PRN
Start: 2016-03-26 — End: 2016-03-26
  Administered 2016-03-26: 130 mg via INTRAVENOUS

## 2016-03-26 MED ORDER — KETOROLAC TROMETHAMINE 15 MG/ML IJ SOLN
15.00 mg | Freq: Once | INTRAMUSCULAR | Status: AC
Start: 2016-03-26 — End: 2016-03-26
  Administered 2016-03-26: 15 mg via INTRAVENOUS
  Filled 2016-03-26: qty 1

## 2016-03-26 MED ORDER — AMLODIPINE 5 MG OR TABS
5.0000 mg | ORAL_TABLET | Freq: Every day | ORAL | Status: DC
Start: 2016-03-27 — End: 2016-03-27
  Filled 2016-03-26: qty 1

## 2016-03-26 MED ORDER — KETOROLAC TROMETHAMINE 30 MG/ML IJ SOLN
30.0000 mg | Freq: Four times a day (QID) | INTRAMUSCULAR | Status: DC
Start: 2016-03-27 — End: 2016-03-27
  Administered 2016-03-26 – 2016-03-27 (×2): 30 mg via INTRAVENOUS
  Filled 2016-03-26 (×2): qty 1

## 2016-03-26 MED ORDER — ONDANSETRON HCL 4 MG/2ML IV SOLN
INTRAMUSCULAR | Status: DC | PRN
Start: 2016-03-26 — End: 2016-03-26
  Administered 2016-03-26: 8 mg via INTRAVENOUS

## 2016-03-26 MED ORDER — FENTANYL CITRATE (PF) 100 MCG/2ML IJ SOLN
25.0000 ug | INTRAMUSCULAR | Status: DC | PRN
Start: 2016-03-26 — End: 2016-03-26

## 2016-03-26 MED ORDER — MOMETASONE FURO-FORMOTEROL FUM 100-5 MCG/ACT IN AERO
1.0000 | INHALATION_SPRAY | Freq: Two times a day (BID) | RESPIRATORY_TRACT | Status: DC
Start: 2016-03-26 — End: 2016-03-27
  Filled 2016-03-26: qty 8.8

## 2016-03-26 MED ORDER — SODIUM CHLORIDE 0.9 % IV SOLN
12.5000 mg | Freq: Once | INTRAVENOUS | Status: DC | PRN
Start: 2016-03-26 — End: 2016-03-26

## 2016-03-26 MED ORDER — FENTANYL CITRATE (PF) 100 MCG/2ML IJ SOLN
25.00 ug | INTRAMUSCULAR | Status: AC | PRN
Start: 2016-03-26 — End: 2016-03-26

## 2016-03-26 MED ORDER — DOCUSATE SODIUM 250 MG OR CAPS
250.0000 mg | ORAL_CAPSULE | Freq: Two times a day (BID) | ORAL | 0 refills | Status: DC | PRN
Start: 2016-03-26 — End: 2016-07-17

## 2016-03-26 MED ORDER — LACTATED RINGERS IV SOLN
INTRAVENOUS | Status: DC
Start: 2016-03-26 — End: 2016-03-26

## 2016-03-26 MED ORDER — OXYCODONE HCL 10 MG OR TABS
10.0000 mg | ORAL_TABLET | ORAL | Status: DC | PRN
Start: 2016-03-26 — End: 2016-03-27
  Administered 2016-03-26 – 2016-03-27 (×2): 10 mg via ORAL
  Filled 2016-03-26 (×2): qty 1

## 2016-03-26 MED ORDER — ONDANSETRON HCL 4 MG/2ML IV SOLN
4.0000 mg | Freq: Once | INTRAMUSCULAR | Status: DC | PRN
Start: 2016-03-26 — End: 2016-03-26

## 2016-03-26 MED ORDER — ACETAMINOPHEN 10 MG/ML IV SOLN
1000.0000 mg | Freq: Four times a day (QID) | INTRAVENOUS | Status: DC
Start: 2016-03-26 — End: 2016-03-27
  Administered 2016-03-26 – 2016-03-27 (×2): 1000 mg via INTRAVENOUS
  Filled 2016-03-26 (×4): qty 100

## 2016-03-26 MED ORDER — FENTANYL CITRATE (PF) 250 MCG/5ML IJ SOLN
INTRAMUSCULAR | Status: DC | PRN
Start: 2016-03-26 — End: 2016-03-26
  Administered 2016-03-26 (×3): 25 ug via INTRAVENOUS
  Administered 2016-03-26: 50 ug via INTRAVENOUS
  Administered 2016-03-26: 25 ug via INTRAVENOUS
  Administered 2016-03-26: 50 ug via INTRAVENOUS

## 2016-03-26 MED ORDER — LACTATED RINGERS IV SOLN
INTRAVENOUS | Status: DC | PRN
Start: 2016-03-26 — End: 2016-03-26
  Administered 2016-03-26: 12:00:00 via INTRAVENOUS

## 2016-03-26 MED ORDER — TAMSULOSIN HCL 0.4 MG PO CAPS
0.4000 mg | ORAL_CAPSULE | Freq: Every day | ORAL | Status: DC
Start: 2016-03-27 — End: 2016-03-27
  Administered 2016-03-27: 0.4 mg via ORAL
  Filled 2016-03-26: qty 1

## 2016-03-26 MED ORDER — NALOXONE HCL 0.4 MG/ML IJ SOLN
0.1000 mg | INTRAMUSCULAR | Status: DC | PRN
Start: 2016-03-26 — End: 2016-03-26

## 2016-03-26 MED ORDER — AMPICILLIN SODIUM 1 GM IV SOLR
INTRAVENOUS | Status: DC | PRN
Start: 2016-03-26 — End: 2016-03-26
  Administered 2016-03-26: 1000 mg via INTRAMUSCULAR

## 2016-03-26 MED ORDER — SODIUM CHLORIDE 0.9 % IV SOLN
INTRAVENOUS | Status: DC
Start: 2016-03-26 — End: 2016-03-27
  Administered 2016-03-26 – 2016-03-27 (×2): via INTRAVENOUS

## 2016-03-26 MED ORDER — HYDROMORPHONE HCL 1 MG/ML IJ SOLN
1.0000 mg | INTRAMUSCULAR | Status: DC | PRN
Start: 2016-03-26 — End: 2016-03-27
  Administered 2016-03-27 (×2): 1 mg via INTRAVENOUS
  Filled 2016-03-26 (×2): qty 1

## 2016-03-26 MED ORDER — DOCUSATE SODIUM 250 MG OR CAPS
250.0000 mg | ORAL_CAPSULE | Freq: Every day | ORAL | Status: DC
Start: 2016-03-27 — End: 2016-03-27
  Administered 2016-03-27: 250 mg via ORAL
  Filled 2016-03-26: qty 1

## 2016-03-26 MED ORDER — OXYCODONE-ACETAMINOPHEN 5-325 MG OR TABS
1.00 | ORAL_TABLET | Freq: Once | ORAL | Status: AC
Start: 2016-03-26 — End: 2016-03-26
  Administered 2016-03-26: 1 via ORAL
  Filled 2016-03-26: qty 1

## 2016-03-26 MED ORDER — FENTANYL CITRATE (PF) 100 MCG/2ML IJ SOLN
50.00 ug | INTRAMUSCULAR | Status: AC | PRN
Start: 2016-03-26 — End: 2016-03-26
  Administered 2016-03-26 (×9): 50 ug via INTRAVENOUS
  Filled 2016-03-26 (×8): qty 2

## 2016-03-26 MED ORDER — MIDAZOLAM HCL 2 MG/2ML IJ SOLN
INTRAMUSCULAR | Status: DC | PRN
Start: 2016-03-26 — End: 2016-03-26
  Administered 2016-03-26: 2 mg via INTRAVENOUS

## 2016-03-26 MED ORDER — DEXAMETHASONE SODIUM PHOSPHATE 4 MG/ML IJ SOLN (CUSTOM)
INTRAMUSCULAR | Status: DC | PRN
Start: 2016-03-26 — End: 2016-03-26
  Administered 2016-03-26: 6 mg via INTRAVENOUS

## 2016-03-26 MED ORDER — MEPERIDINE HCL 25 MG/ML IJ SOLN
12.5000 mg | INTRAMUSCULAR | Status: DC | PRN
Start: 2016-03-26 — End: 2016-03-26

## 2016-03-26 MED ORDER — PHENAZOPYRIDINE HCL 100 MG OR TABS
200.0000 mg | ORAL_TABLET | Freq: Three times a day (TID) | ORAL | Status: DC | PRN
Start: 2016-03-26 — End: 2016-03-27
  Administered 2016-03-26 – 2016-03-27 (×2): 200 mg via ORAL
  Filled 2016-03-26 (×2): qty 2

## 2016-03-26 MED ORDER — LIDOCAINE HCL (CARDIAC) 20 MG/ML IV SOLN
INTRAVENOUS | Status: DC | PRN
Start: 2016-03-26 — End: 2016-03-26
  Administered 2016-03-26: 80 mg via INTRAVENOUS

## 2016-03-26 MED FILL — TRAMADOL HCL TAB 50 MG: MG | 1 days supply | Qty: 15 | Fill #0

## 2016-03-26 SURGICAL SUPPLY — 27 items
ADAPTOR UROLOCK (Misc Medical Supply) IMPLANT
BASKET ZERO TIP NITINOL 1.9FR Ø12MM X 120CM (Procedural wires/sheaths/catheters/balloons/dilators) IMPLANT
CATHETER RETRIEVAL DAKOTA 1.9FR X 8 X 120CM (Procedural wires/sheaths/catheters/balloons/dilators) ×2
CATHETER URETERAL OPEN END 5FR X 70CM (Drains/Catheter/Tubes/Reservoir) ×2
CONNECTOR AMSURE 5-IN-1 (Misc Medical Supply) ×2
CONTAINER PRECISION SPECIMEN, 4OZ- STERILE (Misc Medical Supply) IMPLANT
DILATOR URETERAL POLYETHYLENE 12.0F 60CM (Procedural wires/sheaths/catheters/balloons/dilators) ×2 IMPLANT
DRAPE URO CATCHER BAG-STERILE (Drapes/towels) ×2 IMPLANT
EXTRACTOR STONE NCOMPASS 1.7 FR 115CM 4-WIRE BASKET (Procedural wires/sheaths/catheters/balloons/dilators)
FIBER LASER 200 MICRON (Kits/Sets/Trays) IMPLANT
GLOVE BIOGEL PI ULTRATOUCH SIZE 6.5 (Gloves/gowns) ×2 IMPLANT
GLOVE BIOGEL PI ULTRATOUCH SIZE 7.5 (Gloves/gowns) ×10 IMPLANT
GOWN SURGICAL ULTRA LG BLUE, AAMI LVL 3 (Gloves/gowns) ×2
GUIDEWIRE SENSOR DUAL FLEX STRAIGHT TIP .035 X 150CM NITINOL (Procedural wires/sheaths/catheters/balloons/dilators) ×4 IMPLANT
HOLDER FOLEY CATH SECUREMENT DEVICE STATLOCK (Misc Medical Supply)
HOMIUM LASER HIGH WATTAGE (Services) ×2
PROTECTOR ULNAR NERVE PAD, YELLOW (Misc Medical Supply) ×4
SHEATH URETERAL NAVAGATOR HD 12/14 FR X 36CM (Procedural wires/sheaths/catheters/balloons/dilators)
SHEATH URETERAL NAVIGATOR HD 12/14FR X 46CM (Procedural wires/sheaths/catheters/balloons/dilators)
SLEEVE SCD KNEE MEDIUM (Misc Medical Supply) ×2
SOLUTION IRR BAG .9% N/S 3000ML (Non-Pharmacy Meds/Solutions) ×4
SOLUTION IRR POUR BTL 0.9% NS 1000ML (Non-Pharmacy Meds/Solutions)
SOLUTION IRR POUR BTL H20 1000ML (Non-Pharmacy Meds/Solutions)
SURGICAL PACK CYSTO - SAME DAY (Procedure Packs/kits) ×2
SYRINGE CATH TIP 60ML (Misc Surgical Supply) ×2 IMPLANT
TOWELS OR BLUE 4-PACK STERILE, DISPOSABLE (Drapes/towels) ×2
VALVE ENDOSCOPIC ADJUSTABLE UROSEAL (Misc Surgical Supply) ×2

## 2016-03-26 NOTE — Plan of Care (Signed)
Problem: Impaired gas exchange related to anesthetic/sedative agents, ineffective breathing pattern, secretions, surgical procedure, medications, or preoperative condition  Goal: Patient will exhibit effective ventilatory effort, be able to cough and deep breathe, maintain patent airway, and maintain SPO2 reading 92% or greater  Outcome: Progressing toward goal, anticipate improvement over: next 12-24 hours  Will titrate patient off oxygen as tolerated prior to discharge.       Problem: Alteration in comfort related to pain  Goal: Patient will verbalize or exhibit feelings of comfort and/or decrease in pain, vital signs will remain within parameters ordered by physician/ provider  Outcome: Progressing toward goal, anticipate improvement over: next 12-24 hours  Will administer pain medications as needed per MD order for patient comfort goal and minimal pain.

## 2016-03-26 NOTE — Interdisciplinary (Signed)
03/26/16 1752   Vital Signs   Observations paged MD Jacqlyn Larsen again about pain control issue     Paged MD with the following: [name], Melanie Walsh still in intense pain and received ketorolac dose, still no improvement.

## 2016-03-26 NOTE — H&P (Signed)
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  She has been intolerant of stents in the past and wishes to forgo stent placement at conclusion of today's procedure with understanding that she could potentially require PCN placement post-op.       ROS:  Per HPI    Past Medical and Surgical History:  Past Medical History:   Diagnosis Date   . Asthma    . HTN (hypertension)    . Kidney disease, medullary sponge    . Trigeminal neuralgia      Past Surgical History:   Procedure Laterality Date   . ANTERIOR CRUCIATE LIGAMENT REPAIR     . appendix     . HYSTERECTOMY     . OSTEOTOMY     . SHOULDER SURGERY         Allergies:  Allergies   Allergen Reactions   . Adhesive Tape Rash   . Hydrocodone-Acetaminophen Rash     Vomiting, nightmares   . Latex Rash   . Morphine Shortness of Breath and Swelling   . Nickel Rash     Swelling and rash   . Sulfa Drugs Rash   . Erythromycin Nausea Only       Medications:  No current facility-administered medications on file prior to encounter.      Current Outpatient Prescriptions on File Prior to Encounter   Medication Sig Dispense Refill   . amLODIPINE (NORVASC) 5 MG tablet Take 5 mg by mouth daily.     Marland Kitchen LEVOTHYROXINE SODIUM PO      . METFORMIN HCL PO Take 200 mg by mouth daily.         Social History:  Social History     Social History   . Marital status: Single     Spouse name: N/A   . Number of children: N/A   . Years of education: N/A     Social History Main Topics   . Smoking status: Never Smoker   . Smokeless tobacco: Never Used   . Alcohol use Yes      Comment: rare   . Drug use: No      Comment: denies   . Sexual activity: Not on file     Social Activities of Daily Living Present   . Not on file     Social History Narrative       Family History:  Family History   Problem Relation Age of Onset   . Cancer Father    . Heart  Disease Father    . Hypertension Father    . Cancer Maternal Grandfather    . Cancer Paternal Grandmother    . Stroke Paternal Grandmother    . Cancer Paternal Grandfather    . Diabetes Paternal Grandfather        ----------------------------------------------------------------------------------------------    Physical Exam:  BP  Min: 152/94  Max: 152/94  Temp  Min: 98.6 F (37 C)  Max: 98.6 F (37 C)  Pulse  Min: 84  Max: 84  Resp  Min: 20  Max: 20  SpO2  Min: 98 %  Max: 98 %  Height  Min: 5\' 4"  (162.6 cm)  Max: 5\' 4"  (162.6 cm)  Weight  Min: 74.8 kg (165 lb)  Max: 74.8 kg (165 lb)  GENERAL: Pleasant, cooperative and in no acute distress.   NEURO: Alert and Oriented x 3  HEENT: normalcephalic/atraumatic  NECK: midline trachea  PULM: non-labored breathing  GI: soft, NT, ND  BACK: No CVAT  SKIN: no obvious rashes or lesions  EXT: warm and well perfused.   PSYCH: mood appropriate         Labs:   No results found for: WBC, RBC, HGB, HCT, MCV, MCHC, RDW, PLT, MPV    No results found for: NA, K, CL, BICARB, BUN, CREAT, GLU, [place]    Lab Results   Component Value Date    COLORUA Yellow 02/19/2016    APPEARUA Clear 02/19/2016    GLUCOSEUA Negative 02/19/2016    BILIUA Negative 02/19/2016    KETONEUA Negative 02/19/2016    SGUA 1.020 02/19/2016    BLOODUA Negative 02/19/2016    PHUA 6.0 02/19/2016    PROTEINUA Negative 02/19/2016    UROBILUA 0.2 02/19/2016    NITRITEUA Negative 02/19/2016    LEUKESTUA Negative 02/19/2016       No results found for: INR, PTT       Microbiology:  UCx  - neg    Imaging:  OSH CT - bilateral renal stones    ______________________________________________________________________  Assessment and Plan:  Melanie Walsh is a 55 year old female with hx of medullary sponge kidney and recurrent nephrolithiasis who presents for bilateral URS and HLL today.     - to OR for URS and HLL, no stents    Staffed w/ attending, Dr. Reather Converse  --Margaretha Sheffield, MD  Urology PGY-4  [phone]

## 2016-03-26 NOTE — Brief Op Note (Signed)
ReSKU Brief Operative Note      Surgeon(s) and Role:     * Sur, Layne Benton, MD - Primary     * Jone Panebianco, Jessie Foot, MD - Resident - Assisting    Date of Operation: 2016/04/18    * No Diagnosis Codes entered *     * No Diagnosis Codes entered *    Procedure(s) and Anesthesia Type:     * BILATERAL URETEROSCOPY WITH HOLMIUM LASER LITHOTRIPSY    Implants: * No implants in log *    Specimens:  * No specimens in log *      ReSKU Brief Op Note:   Wound Class: clean/contaminated (GI, biliary, resp, GU tracts entered without spillage)  Closure Type: no incision was made  Surgery performed on a transplant kidney: No   Note Type:  URS    PreOp Dx:  Right kidney stone and left kidney stone    PostOp Dx:  Right kidney stone and left kidney stone    Procedure(s):  Right retrograde pyelogram with fluoroscopic interpretation, left retrograde pyelogram with fluoroscopic interpretation, right ureteroscopy with lithotripsy and left ureteroscopy with lithotripsy    2nd Stage/Look Surgery:  No    Anesthesia Type:  General    PreOp Urine Culture:  Negative    PreOp Antibiotic Treatment:  N/a - no preop antibiotic course taken     Ampicillin and gentamicin     IV Fluids:  500 ml    Estimated Blood Loss:  <73mL ml    Drainage Tube(s):  No existing ureteral stent or nephrostomy tube    Stone Burden:  <1cm    Surgical Position:  Dorsolithotomy    Retrogram Pyelogram performed?  Yes    Externalized Ureteral Stent:  No    Type of ureteroscope used:  Flexible& semi-rigid    Safety wire used:  Yes    Ureteral orifice dilation?  Yes    Access sheath used?:  No    Type of lithotripsy performed:  Laser lithotripsy    Laser size:  200 micron ball-tip    Laser Lithotripsy technique:  Fragmenting    Stone occlusion device used?  No    Stone basket used?Yes    Stone relocation performed? No    Stone Clearance:  Visual clearance of all stone    Stone Concordance:  Concordance    Disposition and Plan:  D/c home without ureteral stents (per patient preference)

## 2016-03-26 NOTE — Interdisciplinary (Signed)
03/26/16 1651   Vital Signs   Observations paged dr Jacqlyn Larsen about possible need to admit patient for pain control     Paged MD the following: Melanie Walsh- Dr Valentino Nose said to page urology to have patient possibly admitted for further pain control and possible nephrostomy tube placement by IR. Pt given fentanyl, toradol, Percocet and still has persistent left back and lower abdominal pain. Please advise. Thanks!

## 2016-03-26 NOTE — Interdisciplinary (Signed)
03/26/16 1600   Vital Signs   Observations Dr Valentino Nose in hallway talked to patient about pain and potentinal to stay overnight, pt up to restroom     Patient up and to restroom, Dr Valentino Nose walking along side discussing with patient her intensity of pain. Dr Valentino Nose said she would call Dr. Reather Converse and let him know. Patient in persistent pain in left back and lower abdomen. Patient states she would be OK being admitted overnight to help with pain. She tells RN how the pain persists despite pain medications. Patient give dose of Percocet to help with long term pain but still requires fentanyl. Dr. Valentino Nose said she would brief the team on the patient's situation and prepare them for her possible admission to the hospital. Will reassess after pain medication set in, but will call MD consult pager if patient still persistently in pain.

## 2016-03-26 NOTE — Op Note (Signed)
PREOPERATIVE DIAGNOSIS: Bilateral renal calculi.    POSTOPERATIVE DIAGNOSIS: Bilateral renal calculi.    PROCEDURE PERFORMED  1. Bilateral ureteroscopy with laser lithotripsy  2. Cystoscopy with Bilateral retrograde pyelogram.  3. Supervision and interpretation of retrograde pyelogram images.    SURGEON/STAFF: Nell Range, M.D.    ASSISTANT: Margaretha Sheffield, MD    ANESTHESIA: General.    INDICATIONS: Patient is a 55 year old female with several bilateral  renal calculi on cross-sectional imaging. After various treatment  options were reviewed, she consented for the above procedures. She  understood the risks, benefits and alternatives.    FINDINGS: Multiple Right renal calculi were removed with a basket  At request of patient no indwelling stents were left in place but the procedure was relatively atraumatic.    PROCEDURE: After informed consent was obtained, the patient was  taken to operating room and general anesthetic was  induced. Patient was then placed in a dorsal lithotomy position,  prepped and draped in a sterile fashion. A 21-French rigid  cystoscope was utilized to place 2 safety wires into the Right  collecting system. Over the safety wire, a 7.4Fr Elba Barman Flex x2 analog scope was then passed into the ureter. Flexible ureteroscope was passed into renal pelvis to identify multiple  stones which were all basketed with the [place] style basket. One stone had to be fragmented when it reached distal ureter. Semirigid scope w 240micron Ho:YAG laser used. Stones removed.    Reinspection of the collecting system showed that all stones had  been removed. A gentle retrograde pyelogram was performed by  injecting contrast through the scope to opacify the system which  showed normal anatomy and all the calyces had been inspected. The  scope was then passed in an antegrade fashion down the ureter  with the access sheath to ensure that no other stones were seen  in the ureter. The ureter appeared pristine after the  procedure.    Similar technique was used for the left side. However, a 12Fr tapered dilator was used for left distal ureter due to tightness. One larger stone in UP was fragmented and removed. A smaller LP stone was removed w basket. The bladder was drained w rigid scope at end.    ESTIMATED BLOOD LOSS: 1cc    TOTAL FLUIDS GIVEN: A liter.    SPECIMENS: Renal calculi.    DISPOSITION: Extubated and transferred to recovery.

## 2016-03-27 DIAGNOSIS — N200 Calculus of kidney: Principal | ICD-10-CM

## 2016-03-27 LAB — BASIC METABOLIC PANEL, BLOOD
Anion Gap: 11 mmol/L (ref 7–15)
BUN: 23 mg/dL — ABNORMAL HIGH (ref 6–20)
Bicarbonate: 26 mmol/L (ref 22–29)
Calcium: 9 mg/dL (ref 8.5–10.6)
Chloride: 104 mmol/L (ref 98–107)
Creatinine: 0.9 mg/dL (ref 0.51–0.95)
GFR: >60 mL/min
Glucose: 102 mg/dL — ABNORMAL HIGH (ref 70–99)
Potassium: 4.5 mmol/L (ref 3.5–5.1)
Sodium: 141 mmol/L (ref 136–145)

## 2016-03-27 LAB — CBC WITH DIFF, BLOOD
ANC-Automated: 5 10*3/uL (ref 1.6–7.0)
Abs Eosinophils: 0.1 10*3/uL (ref 0.1–0.5)
Abs Lymphs: 2.1 10*3/uL (ref 0.8–3.1)
Abs Monos: 0.9 10*3/uL — ABNORMAL HIGH (ref 0.2–0.8)
Basophils: 1 %
Eosinophils: 1 %
Hct: 40.8 % (ref 34.0–45.0)
Hgb: 13 gm/dL (ref 11.2–15.7)
Lymphocytes: 26 %
MCH: 32.1 pg — ABNORMAL HIGH (ref 26.0–32.0)
MCHC: 31.9 g/dL — ABNORMAL LOW (ref 32.0–36.0)
MCV: 100.7 um3 — ABNORMAL HIGH (ref 79.0–95.0)
MPV: 10.5 fL (ref 9.4–12.4)
Monocytes: 11 %
Plt Count: 219 10*3/uL (ref 140–370)
RBC: 4.05 10*6/uL (ref 3.90–5.20)
RDW: 13.7 % (ref 12.0–14.0)
Segs: 61 %
WBC: 8.1 10*3/uL (ref 4.0–10.0)

## 2016-03-27 MED ORDER — ALBUTEROL SULFATE (5 MG/ML) 0.5% IN NEBU
2.5000 mg | INHALATION_SOLUTION | Freq: Four times a day (QID) | RESPIRATORY_TRACT | Status: DC | PRN
Start: 2016-03-26 — End: 2016-03-27

## 2016-03-27 MED ORDER — OXYCODONE HCL 5 MG OR TABS
5.0000 mg | ORAL_TABLET | ORAL | 0 refills | Status: DC | PRN
Start: 2016-03-27 — End: 2016-07-17

## 2016-03-27 MED ORDER — PHENAZOPYRIDINE HCL 100 MG OR TABS
200.0000 mg | ORAL_TABLET | Freq: Three times a day (TID) | ORAL | 0 refills | Status: DC | PRN
Start: 2016-03-27 — End: 2016-07-19

## 2016-03-27 MED ORDER — INFLUENZA VAC SPLIT QUAD 0.5 ML IM SUSY
.50 mL | PREFILLED_SYRINGE | INTRAMUSCULAR | Status: AC
Start: 2016-03-27 — End: 2016-03-27
  Administered 2016-03-27: 0.5 mL via INTRAMUSCULAR

## 2016-03-27 MED ORDER — DOCUSATE SODIUM 250 MG OR CAPS
250.0000 mg | ORAL_CAPSULE | Freq: Two times a day (BID) | ORAL | 0 refills | Status: DC | PRN
Start: 2016-03-27 — End: 2016-07-17

## 2016-03-27 MED ORDER — TAMSULOSIN HCL 0.4 MG PO CAPS
0.4000 mg | ORAL_CAPSULE | Freq: Every day | ORAL | 0 refills | Status: DC
Start: 2016-03-27 — End: 2016-10-26

## 2016-03-27 MED FILL — OXYCODONE HCL TAB 5 MG: MG | 2 days supply | Qty: 15 | Fill #0

## 2016-03-27 MED FILL — TAMSULOSIN HCL CAP 0.4 MG: MG | 30 days supply | Qty: 30 | Fill #0

## 2016-03-27 MED FILL — DOCUSATE SODIUM CAP 250 MG (SOFTGEL): MG | 15 days supply | Qty: 30 | Fill #0

## 2016-03-27 MED FILL — PHENAZOPYRIDINE HCL TAB 100 MG: MG | 4 days supply | Qty: 21 | Fill #0

## 2016-03-27 NOTE — Anesthesia Postprocedure Evaluation (Signed)
Anesthesia Transfer of Care Note    Patient: Melanie Walsh    Procedures performed: Procedure(s):  BILATERAL URETEROSCOPY WITH HOLMIUM LASER LITHOTRIPSY    Vital signs: stable           Anesthesia Post Note    Patient: Melanie Walsh    Procedure(s) Performed: Procedure(s):  BILATERAL URETEROSCOPY WITH HOLMIUM LASER LITHOTRIPSY      Final anesthesia type: General    Patient location: PACU    Post anesthesia pain: adequate analgesia    Mental status: awake, alert  and oriented    Airway Patent: Yes    Last Vitals:   Vitals:    03/27/16 0825   BP: 122/63   Pulse:    Resp:    Temp:    SpO2:        Post vital signs: stable    Hydration: adequate    N/V:no    Anesthetic complications: no    Were two or more anti-emetics used?: No, did not administer two or more anti-emetics    Disposal of controlled substances: All controlled substances during the case accounted for and disposed of per hospital policy    Plan of care per primary team.

## 2016-03-27 NOTE — Interdisciplinary (Signed)
Text paged [phone]: " Pt is c/o pain unrelieved with PO oxycodone. She is concerned that she will not have adequate pain control upon discharge. Pt is asking to receive a telephone call on her cell phone from Dr Reather Converse himself, she is requesting not to speak with a resident."    Awaiting return call.

## 2016-03-27 NOTE — Plan of Care (Signed)
Problem: Pain - Acute  Goal: Control of acute pain  Outcome: Unable to meet goal at this time.  Patient is s/p bilateral ureteroscopy with homium laser lithotripsy.  Patient uses numeric pain scale to verbally report pain, see flow sheet for assessment.  Patient reports that pain is not controlled following procedure with current pain regimen, see eMAR for administration of pain meds.  Paged MD to speak with patient regarding pain control.  Goal ongoing.     Problem: Discharge Planning  Goal: Participation in care planning  Outcome: Progressing toward goal, anticipate improvement over: next 12-24 hours  Patient with discharge orders, patient is hesitant to discharge due to pain post procedure.  Patient requested to speak with the Attending Dr. Reather Converse regarding current pain and discharge readiness.  Resource RN, Water quality scientist,  spoke with clinic and Dr. Reather Converse will call patient this afternoon while inpatient.

## 2016-03-27 NOTE — Interdisciplinary (Signed)
03/27/16 0917   Assessment   Assessment Type Initial   Attended Daily Huddle? Yes   Patient Information   Where was the patient admitted from? From home.    Why is Patient in the Hospital? 55 yo F,  with hx of medullary sponge kidney and recurrent nephrolithiasis , S/P BILATERAL URETEROSCOPY WITH HOLMIUM LASER LITHOTRIPSY 12/07.   Prior to Level of Function Ambulatory/Independent with ADL's   Assistive Device Not applicable   Primary Caretaker(s) Spouse;Family   Primary Contact Name Spouse Linna Hoff  [phone]   Primary Contact Relationship Spouse   Primary Contact Number Pt's cellphone [phone]   Permission to Contact Yes   Status/Conservatorship Voluntary   Income Information   Income Source Self-employed  ([employer] )   Referral To   Financial Resources Other (Comment)  ([employer] / [place] PPO)   Community Resources Other (Comment)  (PCP - DR. PAMELA W. SIMMONS, MD.[facility] in [place]. Uses [facility] in [place], [place]. )   Discharge Planning   Living Arrangements Spouse / significant other;Family members  (Lives in a multilevel home @ [phone] [place], [place] [address], with spouse and 90yo daughter.)   Support Systems Spouse / significant other;Children   Type of Residence Private residence   [place] No   Has discharge transport been arranged? ([place] at bedside, will provide transportation to home. )   Designer, industrial/product Engaged in Discharge Planning Yes   Patient Has Decision Making Capacity Yes   Patient Has Been Given Options And Choice In The Selection of [place] Providers No  (No discharge needs. )   Patient/Family Are In Agreement With Discharge Plan Discharged to home wiht family. Patient alert, ox4. No discharge needs from CM standpoint.    Public Health Clearance Needed No

## 2016-03-27 NOTE — Plan of Care (Signed)
Provided AVS and discharge instructions to patient, patient verbalized understanding of follow up appointments.  Patient spoke with Dr. Reather Converse over the phone who explained that pain was to be expected considering the extent of surgery and to report to the hospital if pain does not subside in 48 hours.  Patient in NAD with husband at bedside, discharge pharmacy just delivered meds to bed.  Will arrange wheel chair transport for patient.

## 2016-03-27 NOTE — Discharge Summary (Signed)
[facility]   Discharge Summary      Date Today:  03/27/16     Patient Name:  Melanie Walsh  MRN:    [id number]  PCP:   Marian Sorrow.  Attending:   Sur  Service:   Urology    Principal Diagnosis (required):  [facility] Problem List (required):  Active Hospital Problems    Diagnosis   . Nephrolithiasis [N20.0]      Resolved Hospital Problems    Diagnosis   No resolved problems to display.     Past Medical History:   Diagnosis Date   . Asthma    . HTN (hypertension)    . Kidney disease, medullary sponge    . Trigeminal neuralgia      Past Surgical History:   Procedure Laterality Date   . ANTERIOR CRUCIATE LIGAMENT REPAIR     . appendix     . HYSTERECTOMY     . OSTEOTOMY     . SHOULDER SURGERY         Additional Hospital Diagnoses ("rule out" or "suspected" diagnoses, etc.):  None    Principal Procedure During This Hospitalization (required):  Procedure(s):  BILATERAL URETEROSCOPY WITH HOLMIUM LASER LITHOTRIPSY - Wound Class: Class II (Clean Contaminated) - Incision Closure: N/A on 03/26/2016    Other Procedures Performed During This Hospitalization (required):  X-ray Fluoro >1 Hr Md Time Asst Surgeon    Result Date: 03/26/2016  Narrative: This exam was to provide Fluoroscopic Guidance.  No Radiologist interpretation will be given.     Consultations Obtained During This Hospitalization:  None    Key consultant recommendations:  n/a    Reason for Admission to the Hospital / History of Present Illness:  Melanie Walsh is a 55 year old female who has a past medical history of Asthma; HTN (hypertension); Kidney disease, medullary sponge; and Trigeminal neuralgia. She was admitted with Nephrolithiasis [N20.0]. She underwent BILATERAL URETEROSCOPY WITH HOLMIUM LASER LITHOTRIPSY for Kidney Stone by Dr. Reather Converse, Layne Benton, MD.     Post-operatively, patient admitted for pain control.    Hospital Course by Problem (required):  Melanie Walsh underwent BILATERAL URETEROSCOPY WITH HOLMIUM LASER LITHOTRIPSY for Kidney  Stone by Dr. Reather Converse, Layne Benton, MD. The patient tolerated the procedure well. Post-operatively, patient was admitted to the hospital for pain control. The patient had an uneventful post-operative course.    She was started on IV apap, and IV toradol, as well as sliding scale oxycodone, which significantly improved her pain.    On day of discharge, the patient's pain was well controlled on oral pain medications, voided without assistance, ambulated without difficulty, and tolerated a Diet Regular diet. She was discharged on 1 Day Post-Op to home. Post discharge plan includes follow up with Dr. Reather Converse. Pt expressed understanding of follow up plans.    Tests Outstanding at Discharge Requiring Follow Up:  n/a    Discharge Condition (required):  Stable.  Key Physical Exam Findings at Discharge:  Physical examination is significant for:   .  General: alert and oriented x3, no apparent distress  Respiratory: nonlabored respirations  Cardiovascular: regular rate and rhythm, well-perfused  Abdomen: soft, nontender, nondistended  Genitourinary: no CVA tenderness    Discharge Diet:    Diet Regular    Discharge Medications:     What To Do With Your Medications      START taking these medications       Add'l Info    * docusate  sodium 250 MG capsule   Commonly known as:  COLACE   Take 1 capsule (250 mg) by mouth 2 times daily as needed for Constipation.    Quantity:  40 capsule   Refills:  0       * docusate sodium 250 MG capsule   Commonly known as:  COLACE   Take 1 capsule (250 mg) by mouth 2 times daily as needed for Constipation.    Quantity:  30 capsule   Refills:  0       oxyCODONE 5 MG immediate release tablet   Commonly known as:  ROXICODONE   Take 1 tablet (5 mg) by mouth every 4 hours as needed for Moderate Pain (Pain Score 4-6).    Quantity:  15 tablet   Refills:  0       phenazopyridine 100 MG tablet   Commonly known as:  PYRIDIUM   Take 2 tablets (200 mg) by mouth 3 times daily as needed (Bladder pain or dysuria).     Quantity:  21 tablet   Refills:  0       tamsulosin 0.4 MG capsule   Commonly known as:  FLOMAX   Take 1 capsule (0.4 mg) by mouth daily (with food).    Quantity:  30 capsule   Refills:  0       traMADol 50 MG tablet   Commonly known as:  ULTRAM   Take 1 tablet (50 mg) by mouth every 6 hours as needed for Moderate Pain (Pain Score 4-6).    Quantity:  15 tablet   Refills:  0       * Notice:  This list has 2 medication(s) that are the same as other medications prescribed for you. Read the directions carefully, and ask your doctor or other care provider to review them with you.      CONTINUE taking these medications       Add'l Info    amLODIPINE 5 MG tablet   Commonly known as:  NORVASC   Take 5 mg by mouth daily.    Refills:  0       fluticasone-salmeterol 100-50 MCG/DOSE inhaler   Commonly known as:  ADVAIR   Inhale 1 puff by mouth every 12 hours.    Refills:  0       hydrochlorothiazide 50 MG tablet   Commonly known as:  HYDRODIURIL   Take 50 mg by mouth daily.    Refills:  0       levalbuterol 45 MCG/ACT inhaler   Commonly known as:  XOPENEX   Inhale 2 puffs by mouth every 6 hours as needed for Wheezing.    Refills:  0       LEVOTHYROXINE SODIUM PO    Refills:  0       METFORMIN HCL PO   Take 200 mg by mouth daily.    Refills:  0       ONE DAILY CALCIUM/IRON PO    Refills:  0       VITAMIN D PO    Refills:  0            Where to Get Your Medications      These medications were sent to [facility]  [address], [place] [address]    Hours:  Mon-Fri 8:30am-7:00pm, Sat-Sun 9am-5:00pm Phone:  [phone]    . docusate sodium 250 MG capsule   . phenazopyridine 100  MG tablet   . tamsulosin 0.4 MG capsule         These medications were sent to [facility], [place] - [address] AT [address]  [place], [place] [address]     Phone:  [phone]    . docusate sodium 250 MG capsule           Please check with staff for printed prescription or if prescription was faxed to your pharmacy.     Bring a paper prescription for each of these medications    . oxyCODONE 5 MG immediate release tablet   . traMADol 50 MG tablet             Allergies:  Allergies   Allergen Reactions   . Adhesive Tape Rash   . Hydrocodone-Acetaminophen Rash     Vomiting, nightmares   . Latex Rash   . Morphine Shortness of Breath and Swelling   . Nickel Rash     Swelling and rash   . Sulfa Drugs Rash     Related topical skin cream   . Erythromycin Nausea Only       Discharge Disposition:  Home.    Discharge Code Status:   Code Status      Full Code - Call Code  This code status is not changed from the time of admission.    Follow Up Appointments:  Scheduled appointments:  No future appointments.    For appointments requested for after discharge that have not yet been scheduled, refer to the Post Discharge Referrals section of the After Visit Summary.

## 2016-03-27 NOTE — Discharge Instructions (Signed)
UROLOGY DISCHARGE INSTRUCTIONS    Diagnosis and Reason for Admission    You were admitted to the hospital for the following reason(s):  Bilateral kidney stones    What Happened During [facility] Stay    The main tests and treatments done for you during this hospitalization were:    Bilateral ureteroscopy with removal of bilateral kidney stones  __________________________________________________________________      1. Follow-up:  You will follow-up with Dr. Reather Converse in several weeks. Someone from our clinic will call you to schedule this.   4 weeks after surgery please complete a 24 hour urine test (our clinic will explain how to do this)  6 weeks after surgery please have CT scan performed. You may now contact the [facility] line at [phone] to schedule your study.        2. Diet:   Fluids are encouraged. Progress to your normal diet as tolerated.     3. Activity:  You can resume your normal activity. Walking is encouraged!    4. Bathing:  You may shower, soak and swim.     5. Medications:  You have been prescribed Tramadol for pain. This medication can cause constipation so take stool softener (Colace) as needed.     Call your doctor or come to the Emergency Department at [facility] if you have:   Severe chills or fever   Excessive blood in the urine with clots or inability to urinate   Uncontrollable pain, nausea, or vomiting   Fainting, trouble breathing, or persistent dizzines    For any concerns, call the [facility] Page Operator at [phone] and ask for the Doctor On-Call for Urology      MODERATE SEDATION [place] have been treated with a sedative, pain medication or anesthetic to provide comfort during a procedure. You must have a responsible adult drive you home. Even though most of the effects will be gone by the time you leave for home, some effects may linger for up to 24 hours.     These include:     1. Sleepiness    2. Confused thinking   3. Dizziness, loss of coordination   4. Nausea   5. Difficulty walking    It is very important that you be watched closely for the next eight hours. In addition, the following activities should be limited or supervised for at least the next 24 hours.     1. Do not drive a car or operate machinery   2. Be very careful going up and down stairs   3. Avoid making important decisions; do not sign any legal documents   4. Do not perform skills requiring coordination    If you are very sleepy, your caregiver should check to be sure that your breathing is normal and that they can awaken you easily. It is best to sleep off the sedation before drinking or eating. When fully alert, you may sip water or other clear fluids. If this goes well, proceed to light, easily swallowed foods such as bananas, applesauce or jello. If vomiting occurs, wait 1-2 hours before trying to drink again.    The following are signs that should be reported to the doctor right away, if you cannot reach your doctor, call the [facility] at [phone] ask to speak to anesthesia on call or  go to the nearest emergency room:     1. Vomiting persisting more than  2 hours   2. Trouble breathing, shortness of breath or chest pain call 911.

## 2016-03-28 ENCOUNTER — Emergency Department (HOSPITAL_BASED_OUTPATIENT_CLINIC_OR_DEPARTMENT_OTHER): Payer: BC Managed Care – PPO

## 2016-03-28 ENCOUNTER — Observation Stay
Admission: EM | Admit: 2016-03-28 | Discharge: 2016-03-30 | Disposition: A | Discharge status: Home / Self Care | Payer: BC Managed Care – PPO | Attending: Urology | Admitting: Urology

## 2016-03-28 ENCOUNTER — Telehealth (HOSPITAL_BASED_OUTPATIENT_CLINIC_OR_DEPARTMENT_OTHER): Payer: Self-pay | Admitting: Urology

## 2016-03-28 DIAGNOSIS — Q615 Medullary cystic kidney: Secondary | ICD-10-CM | POA: Insufficient documentation

## 2016-03-28 DIAGNOSIS — N200 Calculus of kidney: Secondary | ICD-10-CM

## 2016-03-28 DIAGNOSIS — J45909 Unspecified asthma, uncomplicated: Secondary | ICD-10-CM | POA: Insufficient documentation

## 2016-03-28 DIAGNOSIS — E039 Hypothyroidism, unspecified: Secondary | ICD-10-CM | POA: Insufficient documentation

## 2016-03-28 DIAGNOSIS — I10 Essential (primary) hypertension: Secondary | ICD-10-CM | POA: Insufficient documentation

## 2016-03-28 DIAGNOSIS — K219 Gastro-esophageal reflux disease without esophagitis: Secondary | ICD-10-CM | POA: Insufficient documentation

## 2016-03-28 DIAGNOSIS — N2889 Other specified disorders of kidney and ureter: Principal | ICD-10-CM | POA: Insufficient documentation

## 2016-03-28 DIAGNOSIS — Z87442 Personal history of urinary calculi: Secondary | ICD-10-CM | POA: Insufficient documentation

## 2016-03-28 DIAGNOSIS — N132 Hydronephrosis with renal and ureteral calculous obstruction: Secondary | ICD-10-CM

## 2016-03-28 DIAGNOSIS — Z86718 Personal history of other venous thrombosis and embolism: Secondary | ICD-10-CM | POA: Insufficient documentation

## 2016-03-28 DIAGNOSIS — Z833 Family history of diabetes mellitus: Secondary | ICD-10-CM | POA: Insufficient documentation

## 2016-03-28 LAB — URINALYSIS WITH CULTURE REFLEX, WHEN INDICATED
Bilirubin: NEGATIVE
Glucose: NEGATIVE
Ketones: NEGATIVE
Leuk Esterase: NEGATIVE
Nitrite: POSITIVE — AB
RBC: >50 — AB (ref 0–?)
Specific Gravity: 1.01 (ref 1.002–1.030)
pH: 7 (ref 5.0–8.0)

## 2016-03-28 LAB — COMPREHENSIVE METABOLIC PANEL, BLOOD
ALT (SGPT): 14 U/L (ref 0–33)
AST (SGOT): 15 U/L (ref 0–32)
Albumin: 4.2 g/dL (ref 3.5–5.2)
Alkaline Phos: 71 U/L (ref 35–140)
Anion Gap: 11 mmol/L (ref 7–15)
BUN: 19 mg/dL (ref 6–20)
Bicarbonate: 26 mmol/L (ref 22–29)
Bilirubin, Tot: 0.25 mg/dL (ref <1.2)
Calcium: 9.6 mg/dL (ref 8.5–10.6)
Chloride: 106 mmol/L (ref 98–107)
Creatinine: 0.91 mg/dL (ref 0.51–0.95)
GFR: >60 mL/min
Glucose: 117 mg/dL — ABNORMAL HIGH (ref 70–99)
Potassium: 4.8 mmol/L (ref 3.5–5.1)
Sodium: 143 mmol/L (ref 136–145)
Total Protein: 6.9 g/dL (ref 6.0–8.0)

## 2016-03-28 LAB — CBC WITH DIFF, BLOOD
ANC-Automated: 5.7 10*3/uL (ref 1.6–7.0)
Abs Eosinophils: 0.1 10*3/uL (ref 0.1–0.5)
Abs Lymphs: 1.3 10*3/uL (ref 0.8–3.1)
Abs Monos: 0.8 10*3/uL (ref 0.2–0.8)
Basophils: 1 %
Eosinophils: 2 %
Hct: 41.3 % (ref 34.0–45.0)
Hgb: 13.5 gm/dL (ref 11.2–15.7)
Lymphocytes: 16 %
MCH: 33.1 pg — ABNORMAL HIGH (ref 26.0–32.0)
MCHC: 32.7 g/dL (ref 32.0–36.0)
MCV: 101.2 um3 — ABNORMAL HIGH (ref 79.0–95.0)
MPV: 10.8 fL (ref 9.4–12.4)
Monocytes: 10 %
Plt Count: 223 10*3/uL (ref 140–370)
RBC: 4.08 10*6/uL (ref 3.90–5.20)
RDW: 13.9 % (ref 12.0–14.0)
Segs: 72 %
WBC: 7.9 10*3/uL (ref 4.0–10.0)

## 2016-03-28 LAB — GLUCOSE (POCT): Glucose (POCT): 101 mg/dL — ABNORMAL HIGH (ref 70–99)

## 2016-03-28 MED ORDER — NALOXONE HCL 0.4 MG/ML IJ SOLN
0.1000 mg | INTRAMUSCULAR | Status: DC | PRN
Start: 2016-03-28 — End: 2016-03-30

## 2016-03-28 MED ORDER — SODIUM CHLORIDE 0.9 % IV SOLN
INTRAVENOUS | Status: DC
Start: 2016-03-28 — End: 2016-03-29
  Administered 2016-03-28 – 2016-03-29 (×2): via INTRAVENOUS

## 2016-03-28 MED ORDER — GLUCOSE 4 GM PO CHEW (CUSTOM)
4.0000 | CHEWABLE_TABLET | ORAL | Status: DC | PRN
Start: 2016-03-28 — End: 2016-03-29

## 2016-03-28 MED ORDER — STERILE WATER FOR INJECTION IJ SOLN
1000.0000 mg | INTRAMUSCULAR | Status: DC
Start: 2016-03-28 — End: 2016-03-30
  Administered 2016-03-28 – 2016-03-29 (×2): 1000 mg via INTRAVENOUS
  Administered 2016-03-30: 1 g via INTRAVENOUS
  Filled 2016-03-28 (×2): qty 1000

## 2016-03-28 MED ORDER — DEXTROSE (DIABETIC USE) 40 % OR GEL
1.0000 | ORAL | Status: DC | PRN
Start: 2016-03-28 — End: 2016-03-29

## 2016-03-28 MED ORDER — HYDROMORPHONE HCL 1 MG/ML IJ SOLN
1.0000 mg | INTRAMUSCULAR | Status: DC | PRN
Start: 2016-03-28 — End: 2016-03-28

## 2016-03-28 MED ORDER — HYDROMORPHONE HCL 1 MG/ML IJ SOLN
.50 mg | Freq: Once | INTRAMUSCULAR | Status: AC
Start: 2016-03-28 — End: 2016-03-28
  Administered 2016-03-28: 0.5 mg via INTRAVENOUS
  Filled 2016-03-28: qty 0.5

## 2016-03-28 MED ORDER — TAMSULOSIN HCL 0.4 MG PO CAPS
0.4000 mg | ORAL_CAPSULE | Freq: Every day | ORAL | Status: DC
Start: 2016-03-28 — End: 2016-03-30
  Administered 2016-03-28 – 2016-03-30 (×3): 0.4 mg via ORAL
  Filled 2016-03-28 (×3): qty 1

## 2016-03-28 MED ORDER — LEVOTHYROXINE SODIUM 50 MCG OR TABS
100.0000 ug | ORAL_TABLET | Freq: Every day | ORAL | Status: DC
Start: 2016-03-29 — End: 2016-03-30
  Administered 2016-03-29 – 2016-03-30 (×2): 100 ug via ORAL
  Filled 2016-03-28 (×2): qty 2

## 2016-03-28 MED ORDER — OXYCODONE HCL 10 MG OR TABS
10.0000 mg | ORAL_TABLET | ORAL | Status: DC | PRN
Start: 2016-03-28 — End: 2016-03-30
  Administered 2016-03-28 – 2016-03-30 (×8): 10 mg via ORAL
  Filled 2016-03-28 (×8): qty 1

## 2016-03-28 MED ORDER — ONDANSETRON HCL 4 MG/2ML IV SOLN
4.0000 mg | Freq: Four times a day (QID) | INTRAMUSCULAR | Status: DC | PRN
Start: 2016-03-28 — End: 2016-03-28

## 2016-03-28 MED ORDER — HYDROMORPHONE HCL 1 MG/ML IJ SOLN
0.5000 mg | INTRAMUSCULAR | Status: DC | PRN
Start: 2016-03-28 — End: 2016-03-30
  Administered 2016-03-29: 0.5 mg via INTRAVENOUS
  Filled 2016-03-28: qty 1

## 2016-03-28 MED ORDER — ACETAMINOPHEN 325 MG PO TABS
650.0000 mg | ORAL_TABLET | Freq: Four times a day (QID) | ORAL | Status: DC
Start: 2016-03-28 — End: 2016-03-30
  Administered 2016-03-28 – 2016-03-30 (×8): 650 mg via ORAL
  Filled 2016-03-28 (×8): qty 2

## 2016-03-28 MED ORDER — ONDANSETRON HCL 4 MG/2ML IV SOLN
INTRAMUSCULAR | Status: DC
Start: 2016-03-28 — End: 2016-03-28
  Filled 2016-03-28: qty 2

## 2016-03-28 MED ORDER — SODIUM CHLORIDE 0.9 % IJ SOLN (CUSTOM)
3.0000 mL | INTRAMUSCULAR | Status: DC | PRN
Start: 2016-03-28 — End: 2016-03-30

## 2016-03-28 MED ORDER — AMLODIPINE 5 MG OR TABS
5.0000 mg | ORAL_TABLET | Freq: Every day | ORAL | Status: DC
Start: 2016-03-28 — End: 2016-03-30
  Administered 2016-03-28 – 2016-03-30 (×3): 5 mg via ORAL
  Filled 2016-03-28 (×3): qty 1

## 2016-03-28 MED ORDER — GLUCAGON HCL (RDNA) 1 MG IJ SOLR
1.0000 mg | Freq: Once | INTRAMUSCULAR | Status: DC | PRN
Start: 2016-03-28 — End: 2016-03-29

## 2016-03-28 MED ORDER — SODIUM CHLORIDE 0.9 % IV SOLN
INTRAVENOUS | Status: DC
Start: 2016-03-28 — End: 2016-03-28
  Administered 2016-03-28: 15:00:00 via INTRAVENOUS

## 2016-03-28 MED ORDER — ONDANSETRON HCL 4 MG/2ML IV SOLN
4.0000 mg | Freq: Four times a day (QID) | INTRAMUSCULAR | Status: DC | PRN
Start: 2016-03-28 — End: 2016-03-30
  Administered 2016-03-29: 4 mg via INTRAVENOUS
  Filled 2016-03-28: qty 2

## 2016-03-28 MED ORDER — ALBUTEROL SULFATE 108 (90 BASE) MCG/ACT IN AERS
2.0000 | INHALATION_SPRAY | RESPIRATORY_TRACT | Status: DC | PRN
Start: 2016-03-28 — End: 2016-03-30

## 2016-03-28 MED ORDER — INSULIN LISPRO (HUMAN) 100 UNIT/ML SC SOLN (CUSTOM)
1.0000 [IU] | Freq: Four times a day (QID) | INTRAMUSCULAR | Status: DC
Start: 2016-03-28 — End: 2016-03-29

## 2016-03-28 MED ORDER — KETOROLAC TROMETHAMINE 30 MG/ML IJ SOLN
30.0000 mg | Freq: Four times a day (QID) | INTRAMUSCULAR | Status: DC
Start: 2016-03-28 — End: 2016-03-28

## 2016-03-28 MED ORDER — KETOROLAC TROMETHAMINE 30 MG/ML IJ SOLN
30.00 mg | Freq: Once | INTRAMUSCULAR | Status: AC
Start: 2016-03-28 — End: 2016-03-28
  Administered 2016-03-28: 30 mg via INTRAVENOUS
  Filled 2016-03-28: qty 1

## 2016-03-28 MED ORDER — DEXTROSE 50 % IV SOLN
12.5000 g | INTRAVENOUS | Status: DC | PRN
Start: 2016-03-28 — End: 2016-03-29

## 2016-03-28 MED ORDER — ONDANSETRON HCL 4 MG/2ML IV SOLN
4.00 mg | Freq: Once | INTRAMUSCULAR | Status: AC
Start: 2016-03-28 — End: 2016-03-28
  Administered 2016-03-28: 4 mg via INTRAVENOUS
  Filled 2016-03-28: qty 2

## 2016-03-28 MED ORDER — HYDROCHLOROTHIAZIDE 25 MG OR TABS
50.0000 mg | ORAL_TABLET | Freq: Every day | ORAL | Status: DC
Start: 2016-03-28 — End: 2016-03-30
  Administered 2016-03-28 – 2016-03-30 (×2): 50 mg via ORAL
  Filled 2016-03-28 (×4): qty 2

## 2016-03-28 MED ORDER — SODIUM CHLORIDE 0.9 % IJ SOLN (CUSTOM)
3.0000 mL | Freq: Three times a day (TID) | INTRAMUSCULAR | Status: DC
Start: 2016-03-28 — End: 2016-03-30
  Administered 2016-03-29 – 2016-03-30 (×4): 3 mL via INTRAVENOUS

## 2016-03-28 MED ORDER — MORPHINE SULFATE 2 MG/ML IJ SOLN
2.0000 mg | INTRAMUSCULAR | Status: DC | PRN
Start: 2016-03-28 — End: 2016-03-28

## 2016-03-28 MED ORDER — SODIUM CHLORIDE 0.9% TKO INFUSION
INTRAVENOUS | Status: DC | PRN
Start: 2016-03-28 — End: 2016-03-30

## 2016-03-28 MED ORDER — MOMETASONE FURO-FORMOTEROL FUM 100-5 MCG/ACT IN AERO
1.0000 | INHALATION_SPRAY | Freq: Two times a day (BID) | RESPIRATORY_TRACT | Status: DC
Start: 2016-03-28 — End: 2016-03-29
  Filled 2016-03-28: qty 8.8

## 2016-03-28 MED ORDER — KETOROLAC TROMETHAMINE 15 MG/ML IJ SOLN
15.0000 mg | Freq: Four times a day (QID) | INTRAMUSCULAR | Status: DC
Start: 2016-03-28 — End: 2016-03-29
  Administered 2016-03-28 – 2016-03-29 (×2): 15 mg via INTRAVENOUS
  Filled 2016-03-28 (×2): qty 1

## 2016-03-28 MED ORDER — HYDROMORPHONE HCL 1 MG/ML IJ SOLN
0.50 mg | Freq: Once | INTRAMUSCULAR | Status: AC
Start: 2016-03-28 — End: 2016-03-28
  Administered 2016-03-28: 0.5 mg via INTRAVENOUS
  Filled 2016-03-28: qty 0.5

## 2016-03-28 MED ORDER — DOCUSATE SODIUM 250 MG OR CAPS
250.0000 mg | ORAL_CAPSULE | Freq: Two times a day (BID) | ORAL | Status: DC | PRN
Start: 2016-03-28 — End: 2016-03-30

## 2016-03-28 MED ORDER — OXYCODONE HCL 5 MG OR TABS
5.0000 mg | ORAL_TABLET | ORAL | Status: DC | PRN
Start: 2016-03-28 — End: 2016-03-30

## 2016-03-28 MED ORDER — TRAMADOL HCL 50 MG OR TABS
50.0000 mg | ORAL_TABLET | Freq: Four times a day (QID) | ORAL | Status: DC | PRN
Start: 2016-03-28 — End: 2016-03-28

## 2016-03-28 NOTE — ED Notes (Addendum)
Pt to ed for c/o bilateral lithotripsy to remove kidney stones. Had ongoing left flank pain. Then dc`d home with oxycodone and tramadol which was effective until yesterday. sts increasing pain, n/v now. Md at bedside. Hypertensive and hematuria. No bm in in 3 days. Passing flatus

## 2016-03-28 NOTE — ED Provider Notes (Signed)
Emergency Department Note    CC :   Chief Complaint   Patient presents with   . Flank Pain     Pt presnets to ED today per family, states just had lithotripsy, bilat uretheroscopy on Thursday, awoke this am with worsening left sided flank pain, pt called GU and told to come to ED, has had hematuria since, + hx of stones, denies f/c, pt alert, ox3   . Post-op Problem       HPI :   Melanie Walsh is a 55 year old  Female PMhx medullary sponge kidney disease, HTN, s/p lithotripsy 2 days ago, d/c'd from hospital yesterday, now presenting with worsening L flank pain since last night. Pain not controlled with oral pain meds, tramadol and oxycodone. Also reports nausea and vomiting and headache since last night. No difficulty urinating, although endorses decreasing pain with urination that started after surgery. Unsure if she has had hematuria, as she is taking pyridium. No BMs since surgery but has passed flatus. Denies fevers, +chills. Patient paged urology on call this morning and was told to go to the ER for evaluation.     Past Medical History :   Past Medical History:   Diagnosis Date   . Asthma    . HTN (hypertension)    . Kidney disease, medullary sponge    . Trigeminal neuralgia      Reviewed    Past Surgical History :   Past Surgical History:   Procedure Laterality Date   . ANTERIOR CRUCIATE LIGAMENT REPAIR     . appendix     . HYSTERECTOMY     . OSTEOTOMY     . SHOULDER SURGERY       Reviewed       What To Do With Your Medications      CONTINUE taking these medications       Add'l Info    amLODIPINE 5 MG tablet   Commonly known as:  NORVASC   Take 5 mg by mouth daily.    Refills:  0       * docusate sodium 250 MG capsule   Commonly known as:  COLACE   Take 1 capsule (250 mg) by mouth 2 times daily as needed for Constipation.    Quantity:  40 capsule   Refills:  0       * docusate sodium 250 MG capsule   Commonly known as:  COLACE   Take 1 capsule (250 mg) by mouth 2 times daily as needed for Constipation.    Quantity:  30 capsule   Refills:  0       fluticasone-salmeterol 100-50 MCG/DOSE inhaler   Commonly known as:  ADVAIR   Inhale 1 puff by mouth every 12 hours.    Refills:  0       hydrochlorothiazide 50 MG tablet   Commonly known as:  HYDRODIURIL   Take 50 mg by mouth daily.    Refills:  0       levalbuterol 45 MCG/ACT inhaler   Commonly known as:  XOPENEX   Inhale 2 puffs by mouth every 6 hours as needed for Wheezing.    Refills:  0       LEVOTHYROXINE SODIUM PO    Refills:  0       METFORMIN HCL PO   Take 200 mg by mouth daily.    Refills:  0       ONE DAILY CALCIUM/IRON PO    Refills:  0       oxyCODONE 5 MG immediate release tablet   Commonly known as:  ROXICODONE   Take 1 tablet (5 mg) by mouth every 4 hours as needed for Moderate Pain (Pain Score 4-6).    Quantity:  15 tablet   Refills:  0       phenazopyridine 100 MG tablet   Commonly known as:  PYRIDIUM   Take 2 tablets (200 mg) by mouth 3 times daily as needed (Bladder pain or dysuria).    Quantity:  21 tablet   Refills:  0       tamsulosin 0.4 MG capsule   Commonly known as:  FLOMAX   Take 1 capsule (0.4 mg) by mouth daily (with food).    Quantity:  30 capsule   Refills:  0       traMADol 50 MG tablet   Commonly known as:  ULTRAM   Take 1 tablet (50 mg) by mouth every 6 hours as needed for Moderate Pain (Pain Score 4-6).    Quantity:  15 tablet   Refills:  0       VITAMIN D PO    Refills:  0       * Notice:  This list has 2 medication(s) that are the same as other medications prescribed for you. Read the directions carefully, and ask your doctor or other care provider to review them with you.          Allergies :  Adhesive tape; Hydrocodone-acetaminophen; Latex; Morphine; Nickel; Sulfa drugs; and Erythromycin    Social History :   Social History   Substance Use Topics   . Smoking status: Never Smoker   . Smokeless tobacco: Never Used   . Alcohol use Yes      Comment: rare        Family History :  family history includes Cancer in her father, maternal  grandfather, paternal grandfather, and paternal grandmother; Diabetes in her paternal grandfather; Heart Disease in her father; Hypertension in her father; Stroke in her paternal grandmother.      Review of Systems : +flank pain, +N/V, +weakness, +headache, +chills, +constipation, +dysuria, ?hematuria   ROS:  Fever (-), Vision changes (-), Sore throat (-), Chest pain (-), Cough (-), SOB (-), DOE (-), Diarrhea (-), Dark/Bloody stool (-), LE edema (-), Rash (-)     Physical Exam :    03/28/16  1124 03/28/16  1451 03/28/16  1522   BP: (!) 211/108 139/79 144/79   Pulse: 88 85 85   Resp: 16 16 16    Temp: 97.5 F (36.4 C)  98.2 F (36.8 C)   SpO2: 97% 98% 98%     Vitals reviewed; extremely hypertensive on arrival, likely 2/2 pain.     Gen: Patient is tearful, in pain, with emesis basin before her. Alert and responding appropriately, behaving appropriately, non-toxic appearing.  HEENT: PERRL, EOMI, no scleral injection. Moist mucous membranes.   Neck: Supple, no LAD  Lungs: Equal breath sounds bilaterally. No wheeze/stridor/rales/rhonchi. No retractions or increased work of breathing. Decreased effort 2/2 pain  CV: Regular rate. Normal S1 and S2. No murmurs, rubs or gallops appreciated.   Abdomen: +left flank and LLQ TTP. Normal bowel sounds. Soft abdomen. No palpable masses. No guarding or rebound.  Back: + L-sided CVA tenderness.   Extremities: Normal ROM. Cap refill < 2 sec. No LE edema.   Neurologic: Patient mentating appropriately, speech is clear. Cranial nerves 2-12 grossly  intact. No visual  field deficits or diplopia. No facial asymmetry. Sensation is described normal over all extremities.   Skin: No rashes, erythema, lesions.          Labs :  Results for orders placed or performed during the hospital encounter of 03/28/16   Comprehensive Metabolic Panel - See Instructions   Result Value Ref Range    Glucose 117 (H) 70 - 99 mg/dL    BUN 19 6 - 20 mg/dL    Creatinine 0.91 0.51 - 0.95 mg/dL    GFR >60 mL/min     Sodium 143 136 - 145 mmol/L    Potassium 4.8 3.5 - 5.1 mmol/L    Chloride 106 98 - 107 mmol/L    Bicarbonate 26 22 - 29 mmol/L    Anion Gap 11 7 - 15 mmol/L    Calcium 9.6 8.5 - 10.6 mg/dL    Total Protein 6.9 6.0 - 8.0 g/dL    Albumin 4.2 3.5 - 5.2 g/dL    Bilirubin, Tot 0.25 <1.2 mg/dL    AST (SGOT) 15 0 - 32 U/L    ALT (SGPT) 14 0 - 33 U/L    Alkaline Phos 71 35 - 140 U/L   CBC w/Auto Diff Lavender   Result Value Ref Range    WBC 7.9 4.0 - 10.0 1000/mm3    RBC 4.08 3.90 - 5.20 mill/mm3    Hgb 13.5 11.2 - 15.7 gm/dL    Hct 41.3 34.0 - 45.0 %    MCV 101.2 (H) 79.0 - 95.0 um3    MCH 33.1 (H) 26.0 - 32.0 pgm    MCHC 32.7 32.0 - 36.0 g/dL    RDW 13.9 12.0 - 14.0 %    MPV 10.8 9.4 - 12.4 fL    Plt Count 223 140 - 370 1000/mm3    Segs 72 %    Lymphocytes 16 %    Monocytes 10 %    Eosinophils 2 %    Basophils 1 %    ANC-Automated 5.7 1.6 - 7.0 1000/mm3    Abs Lymphs 1.3 0.8 - 3.1 1000/mm3    Abs Monos 0.8 0.2 - 0.8 1000/mm3    Abs Eosinophils 0.1 <0.1 - 0.5 1000/mm3    Diff Type Automated    Urinalysis with Culture Reflex, when indicated   Result Value Ref Range    Type Not Specified     Color Amber Yellow    Appearance Cloudy Clear    Specific Gravity 1.010 1.002 - 1.030    pH 7.0 5.0 - 8.0    Protein 2+ (A) Negative    Glucose Negative Negative    Ketones Negative Negative    Bilirubin Negative Negative    Blood 3+ (A) Negative    Urobilinogen 2+ (A) Negative    Nitrite Positive (A) Negative    Leuk Esterase Negative Negative    WBC 3-5 (A) 0-2/HPF    RBC >50 (A) 0-2/HPF    Bacteria Moderate (A) None-Rare/HPF    Squam. Epithelial Cell 0-5(RARE) <6-10(FEW)    Mucus Rare None-Rare/HPF    Amorphous Crystals Rare None/HPF       Diagnostic Studies :      US Kidney Complete    Result Date: 03/28/2016  Narrative: EXAM DESCRIPTION: US KIDNEY COMPLETE CLINICAL HISTORY: Left flank pain, status post lithotripsy on 12/07. Evaluate for hydronephrosis. TECHNIQUE: Per Protocol. COMPARISON: CT abdomen and pelvis from 01/30/2016  CONCURRENT SUPERVISION: I have reviewed the images and agree with the resident's interpretation.  Impression: FINDINGS: The right kidney measures 10.5 cm and the left kidney measures 10.2 cm in long axis. Both are within normal limits. Mild bilateral hydronephrosis. Echogenic calculus at the left ureterovesical junction with twinkle artifact and posterior acoustic shadowing measuring 0.3 cm. Bilateral ureteral jets are visualized. The bladder is unremarkable. Flow is present in the main renal artery and vein bilaterally. IMPRESSION: Partially obstructing calculus in the left ureterovesicular junction measuring 0.3 cm. Preserved left ureteral jet visualized in the bladder. Mild bilateral hydronephrosis.         Assessment/Plan :  Melanie Walsh is a 55 year old female with history of medullary sponge kidney disease who presented 2 days s/p lithotripsy with worsening L flank/ LLQ pain since last night, not controlled on oral pain medications. Patient also presenting with nausea, vomiting, and possible hematuria. BP elevated upon arrival, consistent with severe pain.     Renal U/S suggestive of UVJ calculus. Case discussed with urology, who will admit patient for observation and further pain management. Accepting attending, Dr. Jenene Slicker.     The following medications were administered during patient's ED stay:  Medications   HYDROmorphone (DILAUDID) injection 1 mg (not administered)   ondansetron (ZOFRAN) injection 4 mg (not administered)   sodium chloride 0.9% infusion ( IntraVENOUS New Bag 03/28/16 1445)   ondansetron (ZOFRAN) injection 4 mg (4 mg IntraVENOUS Given 03/28/16 1157)   HYDROmorphone (DILAUDID) injection 0.5 mg (0.5 mg IntraVENOUS Given 03/28/16 1157)   HYDROmorphone (DILAUDID) injection 0.5 mg (0.5 mg IntraVENOUS Given 03/28/16 1313)   ketorolac (TORADOL) injection 30 mg (30 mg IntraVENOUS Given 03/28/16 1412)           Case discussed with attending physician, Sigmund Hazel, Rene Paci,  MD  PGY-1       Dorothea Glassman, MD  Resident  03/28/16 [place], Clint Lipps, MD  03/28/16 1900

## 2016-03-28 NOTE — Interdisciplinary (Signed)
[id number] is sending a message to: [place] / [phone] [place],K--pt has refused HS bld sugar check. She is also requesting if we could discontinue the BS checks as she said she is not diabetic. Thank you

## 2016-03-28 NOTE — ED Floor Report (Signed)
ED to IP Handoff    Report created by Sheliah Hatch, RN at 2:16 PM 03/28/2016.     HANDOFF REPORT UPDATE/CHANGES (changes in patient status/care/events prior to transfer)  By who:  Time:   Additional information:                                                                                                                                                     Melanie Walsh is a 55 year old female.    Brief Summary of ED Visit (to include focused assessment and neuro status):  Pt to ed for c/o left flank, lower back and llq pain. sts she bilateral kidney stones and lithotripsy to break up stones 4 days ago. Sent home on pain management but sts pain increased yesterday. +hematuria. Pt sent to [place] while in ed. Found to have another kidney stone in left kidney and some bilateral hydronephrosis, worse on the left. Pt is ambulatory. Pain managed with small doses of dilaudid. zofran given for nausea. toradol 30mg  also given. Aox4. Speech clear. Pt sts she has medullary kidney disease and that`s why she continues to get kidney stones    RN shift assessment exceptions to WDL: hydronephrosis. pain    Any significant events and interventions with responses:  Pain management    Radiologic studies not completed: US done  (None unless otherwise noted)    Chief Complaint   Patient presents with   . Flank Pain     Pt presnets to ED today per family, states just had lithotripsy, bilat uretheroscopy on Thursday, awoke this am with worsening left sided flank pain, pt called GU and told to come to ED, has had hematuria since, + hx of stones, denies f/c, pt alert, ox3   . Post-op Problem       Admitted for: nephrolithiasis    Code Status:  Please refer to In-pt admitting doctors orders     Level of Care: ms/tele     Is patient septic? no If yes, complete below:    BC x 2 drawn? no  If No explain:  na    Repeat lactate needed? no  If Yes, when is it due?  na    All initial antibiotics given?  no  If No, explain:  na    Amount of IV fluids  received 0 ml    Is patient on Heparin? no If yes, complete below:     Time Heparin bolus was given: na    Additional drips patient is on: na    Cardiac rhythm: nsr    Oxygen Delivery: None    Past Medical History:   Diagnosis Date   . Asthma    . HTN (hypertension)    . Kidney disease, medullary sponge    . Trigeminal neuralgia  Past Surgical History:   Procedure Laterality Date   . ANTERIOR CRUCIATE LIGAMENT REPAIR     . appendix     . HYSTERECTOMY     . OSTEOTOMY     . SHOULDER SURGERY         Allergies: Adhesive tape; Hydrocodone-acetaminophen; Latex; Morphine; Nickel; Sulfa drugs; and Erythromycin    ED Fall Risk: No    Skin issues:  no    >> If yes, note areas of skin breakdown. See appropriate photos.      Ambulatory:  yes    Sitter needed: no    Suicide Risk:  no    Isolation Required: no     >> If yes , what type of isolation: na    Is patient in custody?  no    Is patient in restraints? no    Vitals:    03/28/16 1124   BP: (!) 211/108   Pulse: 88   Resp: 16   Temp: 97.5 F (36.4 C)   SpO2: 97%   Weight: 80.9 kg (178 lb 5.6 oz)   Height: 5\' 4"  (1.626 m)       Tubes Collected: Blue, Yellow SST, Green PST, Lavender (03/28/16 1209 : Sheliah Hatch, RN)    Lab Results   Component Value Date    WBC 7.9 03/28/2016    RBC 4.08 03/28/2016    HGB 13.5 03/28/2016    HCT 41.3 03/28/2016    MCV 101.2 (H) 03/28/2016    MCHC 32.7 03/28/2016    RDW 13.9 03/28/2016    PLT 223 03/28/2016    MPV 10.8 03/28/2016       Lab Results   Component Value Date    NA 143 03/28/2016    K 4.8 03/28/2016    CL 106 03/28/2016    BICARB 26 03/28/2016    BUN 19 03/28/2016    CREAT 0.91 03/28/2016    GLU 117 (H) 03/28/2016    [place] 9.6 03/28/2016       No results found for: BNP, PHOS, MG, LACTATE, AMMONIA, IONCA, ARTIONCA    No results found for: CPK, CKMBH, TROPONIN    No results found for: PH, PCO2, O2CONTENT, IVHC3, IVBE, O2SAT, UNPH, UNPCO2, ARTPH, ARTPCO2, ARTO2CNT, IAHC3, IABE, ARTO2SAT, UNAPH, UNAPCO2    No results found for this  visit on 03/28/16.      Patient Lines/Drains/Airways Status    Active PICC Line / CVC Line / PIV Line / Drain / Airway / Intraosseous Line / Epidural Line / ART Line / Line Type / Wound     Name: Placement date: Placement time: Site: Days:    Peripheral IV - 20 G Right Antecubital 03/28/16   1139   Antecubital   less than 1                    ED Handoff Report is ready for review.  Admitting RN may reach Emergency Department RN, Sheliah Hatch, RN, at [phone] with any questions.

## 2016-03-28 NOTE — Interdisciplinary (Signed)
Patient requesting to advance diet. Patient would like to eat dinner. 1st call MD paged. Awaiting response.

## 2016-03-28 NOTE — ED Notes (Signed)
Pt. To US via gurney

## 2016-03-28 NOTE — Telephone Encounter (Signed)
Pt called, having left sided flank pain with nausea and unable to control with PO pain meds    Denies f/c/    Had URS and asked Dr. Reather Converse not to place stent    Dicussed she may have hydro or poor pain control    Instructed patient to come to the ED

## 2016-03-28 NOTE — Interdisciplinary (Signed)
03/28/16 1449   Assessment   Assessment Type Initial   Patient Information   Where was the patient admitted from? Home   Why is Patient in the Hospital? Pt s/p lithotripsy 12/7. In ED with left flank, lower back and llq pain. Pt found to have bilateral kidney stones with hydronephrosis.      Saw pt at bedside. No recent changes from previous CM assessment. Pt seen by Inpt CM yesterday. (see CM notes 03/27/16). No CM needs identified at the moment. Inpt CM to follow for safe DCP. Marland KitchenHorald Pollen, RN

## 2016-03-28 NOTE — Consults (Signed)
[facility]  Urology  ER Consult Note    Patient Name:  Melanie Walsh MRN:    [id number] Room#:   18/18 Service:   Emergency Medicine    Requesting physician: Dr. Sigmund Hazel, Clint Lipps, *    Reason for Consult: left flank pain      History of Present Illness: Melanie Walsh is a 55 year old female with hx of nephrolithiasis, who underwent a b/l URS 03/26/16, stent-less, who presented to the ED with left flank pain, nausea and emesis    Pain at home was too much and uncontrolled with PO medications. In addition, she was unable to keep anything down.    She denies f/c/cp/sob    In terms of management while in the ED, the  patient has received the following tests/procedures/treatments while in the ED:  Renal US  Orders Placed This Encounter   Procedures   . US Kidney Complete   . Comprehensive Metabolic Panel - See Instructions   . CBC w/Auto Diff Lavender   . Urinalysis with Culture Reflex, when indicated       Past Medical History:   Diagnosis Date   . Asthma    . HTN (hypertension)    . Kidney disease, medullary sponge    . Trigeminal neuralgia        Past Surgical History:   Procedure Laterality Date   . ANTERIOR CRUCIATE LIGAMENT REPAIR     . appendix     . HYSTERECTOMY     . OSTEOTOMY     . SHOULDER SURGERY         No current facility-administered medications on file prior to encounter.      Current Outpatient Prescriptions on File Prior to Encounter   Medication Sig Dispense Refill   . amLODIPINE (NORVASC) 5 MG tablet Take 5 mg by mouth daily.     . Cholecalciferol (VITAMIN D PO)      . docusate sodium (COLACE) 250 MG capsule Take 1 capsule (250 mg) by mouth 2 times daily as needed for Constipation. 30 capsule 0   . docusate sodium (COLACE) 250 MG capsule Take 1 capsule (250 mg) by mouth 2 times daily as needed for Constipation. 40 capsule 0   . fluticasone-salmeterol (ADVAIR) 100-50 MCG/DOSE inhaler Inhale 1 puff by mouth every 12 hours.     . hydrochlorothiazide (HYDRODIURIL) 50 MG tablet Take 50 mg by  mouth daily.     Marland Kitchen levalbuterol (XOPENEX) 45 MCG/ACT inhaler Inhale 2 puffs by mouth every 6 hours as needed for Wheezing.     Marland Kitchen LEVOTHYROXINE SODIUM PO      . METFORMIN HCL PO Take 200 mg by mouth daily.     . Multiple Vitamins-Minerals (ONE DAILY CALCIUM/IRON PO)      . oxyCODONE (ROXICODONE) 5 MG immediate release tablet Take 1 tablet (5 mg) by mouth every 4 hours as needed for Moderate Pain (Pain Score 4-6). 15 tablet 0   . phenazopyridine (PYRIDIUM) 100 MG tablet Take 2 tablets (200 mg) by mouth 3 times daily as needed (Bladder pain or dysuria). 21 tablet 0   . tamsulosin (FLOMAX) 0.4 MG capsule Take 1 capsule (0.4 mg) by mouth daily (with food). 30 capsule 0   . traMADol (ULTRAM) 50 MG tablet Take 1 tablet (50 mg) by mouth every 6 hours as needed for Moderate Pain (Pain Score 4-6). 15 tablet 0       Allergies   Allergen Reactions   . Adhesive Tape Rash   .  Hydrocodone-Acetaminophen Rash     Vomiting, nightmares   . Latex Rash   . Morphine Shortness of Breath and Swelling   . Nickel Rash     Swelling and rash   . Sulfa Drugs Rash     Related topical skin cream   . Erythromycin Nausea Only        Family History   Problem Relation Age of Onset   . Cancer Father    . Heart Disease Father    . Hypertension Father    . Cancer Maternal Grandfather    . Cancer Paternal Grandmother    . Stroke Paternal Grandmother    . Cancer Paternal Grandfather    . Diabetes Paternal Grandfather         Social History:  Tobacco: The patient reports that she has never smoked. She has never used smokeless tobacco.  Alcohol: The patient reports that she drinks alcohol.  Drugs: The patient reports that she does not use illicit drugs.    Review of Systems:   as per HPI.    Physical Exam:  BP (!) 211/108  Pulse 88  Temp 97.5 F (36.4 C)  Resp 16  Ht _0  (1.626 m)  Wt 80.9 kg (178 lb 5.6 oz)  SpO2 97%  BMI 30.61 kg/m2   GENERAL: Pleasant, cooperative and in no mild distress.   NEURO: Alert and Oriented x 3  HEENT:  normalcephalic/atraumatic  NECK: midline trachea  PULM: non-labored breathing  GI: soft, NT, ND  BACK: Left  CVAT  GU: deferred  EXT: warm and well perfused.     Laboratory :  CBC  Recent Labs      03/27/16   0823  03/28/16   1151   WBC  8.1  7.9   HGB  13.0  13.5   HCT  40.8  41.3   PLT  219  223   SEG  61  72   LYMPHS  26  16   MONOS  11  10      Chemistry  Recent Labs      03/27/16   0822  03/28/16   1151   NA  141  143   K  4.5  4.8   CL  104  106   BICARB  26  26   BUN  23*  19   CREAT  0.90  0.91   GLU  102*  117*   [place]  9.0  9.6     Recent Labs      03/28/16   1151   ALK  71   AST  15   ALT  14   TBILI  0.25   ALB  4.2        Coags  No results for input(s): PT, PTT, INR in the last 72 hours.  UA  Lab Results   Component Value Date    COLORUA Amber 03/28/2016    APPEARUA Cloudy 03/28/2016    GLUCOSEUA Negative 03/28/2016    BILIUA Negative 03/28/2016    KETONEUA Negative 03/28/2016    SGUA 1.010 03/28/2016    BLOODUA 3+ (A) 03/28/2016    PHUA 7.0 03/28/2016    PROTEINUA 2+ (A) 03/28/2016    UROBILUA 2+ (A) 03/28/2016    NITRITEUA Positive (A) 03/28/2016    LEUKESTUA Negative 03/28/2016    WBCUA 3-5 (A) 03/28/2016    RBCUA >50 (A) 03/28/2016       Microbiology:     Radiology:  [place]  Fluoro >1 Hr Md Time Asst Surgeon    Result Date: 03/26/2016  Narrative: This exam was to provide Fluoroscopic Guidance.  No Radiologist interpretation will be given.       RENAL US: Mild to moderate left hydro, 36m left UVJ stone    Assessment:  This is a [age]year old female with hx of nephrolithiasis who presents for ED for left flank pain. Urology consulted for left UVJ stone.    Patient has mild pain and moderate hydro. She is afebrile and is otherwise non toxic. At this time will give trial of passage. If she fails will need URS or cysto stent    Recommendations:  -  Admit to urology  -toradol  -flomax  -IV CTX  -AM labs  -NPO at midnight      Thank you for involving [place] the care of this patient. We will be available if you have  further questions or concerns.    Discussed with Urology Attending on call Dr. DJenene Slickerand SReather Converse

## 2016-03-28 NOTE — H&P (Signed)
[facility]  Urology  H&P      History of Present Illness: Melanie Walsh is a 55 year old female with hx of nephrolithiasis, who underwent a b/l URS 03/26/16, stent-less, who presented to the ED with left flank pain, nausea and emesis    Pain at home was too much and uncontrolled with PO medications. In addition, she was unable to keep anything down.    She denies f/c/cp/sob    In terms of management while in the ED, the  patient has received the following tests/procedures/treatments while in the ED:  Renal US  Orders Placed This Encounter   Procedures   . US Kidney Complete   . Comprehensive Metabolic Panel - See Instructions   . CBC w/Auto Diff Lavender   . Urinalysis with Culture Reflex, when indicated       Past Medical History:   Diagnosis Date   . Asthma    . HTN (hypertension)    . Kidney disease, medullary sponge    . Trigeminal neuralgia        Past Surgical History:   Procedure Laterality Date   . ANTERIOR CRUCIATE LIGAMENT REPAIR     . appendix     . HYSTERECTOMY     . OSTEOTOMY     . SHOULDER SURGERY         No current facility-administered medications on file prior to encounter.      Current Outpatient Prescriptions on File Prior to Encounter   Medication Sig Dispense Refill   . amLODIPINE (NORVASC) 5 MG tablet Take 5 mg by mouth daily.     . Cholecalciferol (VITAMIN D PO)      . docusate sodium (COLACE) 250 MG capsule Take 1 capsule (250 mg) by mouth 2 times daily as needed for Constipation. 30 capsule 0   . docusate sodium (COLACE) 250 MG capsule Take 1 capsule (250 mg) by mouth 2 times daily as needed for Constipation. 40 capsule 0   . fluticasone-salmeterol (ADVAIR) 100-50 MCG/DOSE inhaler Inhale 1 puff by mouth every 12 hours.     . hydrochlorothiazide (HYDRODIURIL) 50 MG tablet Take 50 mg by mouth daily.     Marland Kitchen levalbuterol (XOPENEX) 45 MCG/ACT inhaler Inhale 2 puffs by mouth every 6 hours as needed for Wheezing.     Marland Kitchen LEVOTHYROXINE SODIUM PO      . METFORMIN HCL PO Take 200 mg by mouth  daily.     . Multiple Vitamins-Minerals (ONE DAILY CALCIUM/IRON PO)      . oxyCODONE (ROXICODONE) 5 MG immediate release tablet Take 1 tablet (5 mg) by mouth every 4 hours as needed for Moderate Pain (Pain Score 4-6). 15 tablet 0   . phenazopyridine (PYRIDIUM) 100 MG tablet Take 2 tablets (200 mg) by mouth 3 times daily as needed (Bladder pain or dysuria). 21 tablet 0   . tamsulosin (FLOMAX) 0.4 MG capsule Take 1 capsule (0.4 mg) by mouth daily (with food). 30 capsule 0   . traMADol (ULTRAM) 50 MG tablet Take 1 tablet (50 mg) by mouth every 6 hours as needed for Moderate Pain (Pain Score 4-6). 15 tablet 0       Allergies   Allergen Reactions   . Adhesive Tape Rash   . Hydrocodone-Acetaminophen Rash     Vomiting, nightmares   . Latex Rash   . Morphine Shortness of Breath and Swelling   . Nickel Rash     Swelling and rash   . Sulfa Drugs Rash  Related topical skin cream   . Erythromycin Nausea Only        Family History   Problem Relation Age of Onset   . Cancer Father    . Heart Disease Father    . Hypertension Father    . Cancer Maternal Grandfather    . Cancer Paternal Grandmother    . Stroke Paternal Grandmother    . Cancer Paternal Grandfather    . Diabetes Paternal Grandfather         Social History:  Tobacco: The patient reports that she has never smoked. She has never used smokeless tobacco.  Alcohol: The patient reports that she drinks alcohol.  Drugs: The patient reports that she does not use illicit drugs.    Review of Systems:   as per HPI.    Physical Exam:  BP (!) 211/108  Pulse 88  Temp 97.5 F (36.4 C)  Resp 16  Ht '5\' 4"'  (1.626 m)  Wt 80.9 kg (178 lb 5.6 oz)  SpO2 97%  BMI 30.61 kg/m2   GENERAL: Pleasant, cooperative and in no mild distress.   NEURO: Alert and Oriented x 3  HEENT: normalcephalic/atraumatic  NECK: midline trachea  PULM: non-labored breathing  GI: soft, NT, ND  BACK: Left  CVAT  GU: deferred  EXT: warm and well perfused.     Laboratory :  CBC  Recent Labs      03/27/16   0823   03/28/16   1151   WBC  8.1  7.9   HGB  13.0  13.5   HCT  40.8  41.3   PLT  219  223   SEG  61  72   LYMPHS  26  16   MONOS  11  10      Chemistry  Recent Labs      03/27/16   0822  03/28/16   1151   NA  141  143   K  4.5  4.8   CL  104  106   BICARB  26  26   BUN  23*  19   CREAT  0.90  0.91   GLU  102*  117*     9.0  9.6     Recent Labs      03/28/16   1151   ALK  71   AST  15   ALT  14   TBILI  0.25   ALB  4.2        Coags  No results for input(s): PT, PTT, INR in the last 72 hours.  UA  Lab Results   Component Value Date    COLORUA Amber 03/28/2016    APPEARUA Cloudy 03/28/2016    GLUCOSEUA Negative 03/28/2016    BILIUA Negative 03/28/2016    KETONEUA Negative 03/28/2016    SGUA 1.010 03/28/2016    BLOODUA 3+ (A) 03/28/2016    PHUA 7.0 03/28/2016    PROTEINUA 2+ (A) 03/28/2016    UROBILUA 2+ (A) 03/28/2016    NITRITEUA Positive (A) 03/28/2016    LEUKESTUA Negative 03/28/2016    WBCUA 3-5 (A) 03/28/2016    RBCUA >50 (A) 03/28/2016       Microbiology:     Radiology:  [place] >1 Hr Md Time Asst Surgeon    Result Date: 03/26/2016  Narrative: This exam was to provide Fluoroscopic Guidance.  No Radiologist interpretation will be given.       RENAL US: Mild to moderate left hydro, 40m left  UVJ stone    Assessment:  This is a 55 year old female with hx of nephrolithiasis who presents for ED for left flank pain. Urology consulted for left UVJ stone.    Patient has mild pain and moderate hydro. She is afebrile and is otherwise non toxic. At this time will give trial of passage. If she fails will need URS or cysto stent    Recommendations:  -  Admit to urology  -toradol  -flomax  -IV CTX  -AM labs  -NPO at midnight      Thank you for involving [place] in the care of this patient. We will be available if you have further questions or concerns.    Discussed with Urology Attending on call Dr. Jenene Slicker and Reather Converse

## 2016-03-29 LAB — BASIC METABOLIC PANEL, BLOOD
Anion Gap: 10 mmol/L (ref 7–15)
BUN: 16 mg/dL (ref 6–20)
Bicarbonate: 27 mmol/L (ref 22–29)
Calcium: 8.8 mg/dL (ref 8.5–10.6)
Chloride: 105 mmol/L (ref 98–107)
Creatinine: 0.61 mg/dL (ref 0.51–0.95)
GFR: >60 mL/min
Glucose: 106 mg/dL — ABNORMAL HIGH (ref 70–99)
Potassium: 4.1 mmol/L (ref 3.5–5.1)
Sodium: 142 mmol/L (ref 136–145)

## 2016-03-29 MED ORDER — KETOROLAC TROMETHAMINE 15 MG/ML IJ SOLN
15.0000 mg | Freq: Four times a day (QID) | INTRAMUSCULAR | Status: DC | PRN
Start: 2016-03-29 — End: 2016-03-29

## 2016-03-29 MED ORDER — KETOROLAC TROMETHAMINE 15 MG/ML IJ SOLN
15.0000 mg | Freq: Four times a day (QID) | INTRAMUSCULAR | Status: DC
Start: 2016-03-30 — End: 2016-03-30
  Administered 2016-03-29 – 2016-03-30 (×3): 15 mg via INTRAVENOUS
  Filled 2016-03-29 (×3): qty 1

## 2016-03-29 MED ORDER — DEXTROSE-NACL 5-0.45 % IV SOLN (CUSTOM)
INTRAVENOUS | Status: DC
Start: 2016-03-30 — End: 2016-03-30
  Administered 2016-03-29 – 2016-03-30 (×2): via INTRAVENOUS

## 2016-03-29 MED ORDER — IBUPROFEN 600 MG OR TABS
600.00 mg | ORAL_TABLET | Freq: Four times a day (QID) | ORAL | Status: AC
Start: 2016-03-29 — End: 2016-03-29
  Administered 2016-03-29: 600 mg via ORAL
  Filled 2016-03-29: qty 1

## 2016-03-29 MED ORDER — IBUPROFEN 600 MG OR TABS
600.0000 mg | ORAL_TABLET | Freq: Four times a day (QID) | ORAL | Status: DC
Start: 2016-03-29 — End: 2016-03-29
  Administered 2016-03-29 (×2): 600 mg via ORAL
  Filled 2016-03-29 (×2): qty 1

## 2016-03-29 NOTE — Plan of Care (Signed)
Handoff report given to Vilma RN.

## 2016-03-29 NOTE — Progress Notes (Signed)
Will add on pt for left URS HLL stone extraction given concerns of failure to pass    The risks, benefits and alternatives of the planned procedure have been discussed with the patient and/or her legal representative, all questions have been answered and they agree to proceed. The risks discussed include, but are not limited to the following:   -- The risks of infection requiring antibiotics, a prolonged hospital course, and possible reoperation have been reviewed.   -- Risks of wound complications, prolonged hospital stay  -- The risk of damage to adjacent organs (vagina, bowel, bladder, ureters, nerves, and vessels) requiring further surgical intervention has been reviewed.   -- The risk of pain requiring IV analgesics has been reviewed.   -- The risk of medical complications including, but not limited to DVT, MI, CVA, and death has been reviewed.   -- The risk of failed procedure or need for repeat procedure or stent reviewed    Consent signed    Sunil H. [name] Pronto, M.D.  [facility]  PGY3  [phone]

## 2016-03-29 NOTE — Plan of Care (Signed)
Problem: Pain - Acute  Goal: Communication of presence of pain  Outcome: Goal Met  Pt able to communicate for presence of pain. Last time she had PRN Oxycodone was at 1646. Pt on Toradol 15 mg IVP q6H

## 2016-03-29 NOTE — Progress Notes (Signed)
Urology Progress Note    Patient Name: Melanie Walsh: [id number] Room#: [facility] Day:   0 days - Admitted on: 03/28/2016    Admitted: 03/28/2016 11:26 AM    Identification: Melanie Walsh is a 55 year old female s/p b/l URS with left UVJ stone    Subjective/Events:   NAEO  Pain well controlled  Has been out of bed  Tolerating current diet  is passing flatus  Denies f/c/n/v/cp/sob        Scheduled Meds  . acetaminophen  650 mg Q6H   . amLODIPINE  5 mg Daily   . cefTRIAXone (ROCEPHIN) IV  1,000 mg Q24H NR   . hydrochlorothiazide  50 mg Daily   . insulin lispro  1-10 Units 4x Daily WC   . ketorolac  15 mg Q6H   . levothyroxine  100 mcg QAM AC   . mometasone-formoterol  1 puff Q12H   . sodium chloride (PF)  3 mL Q8H   . tamsulosin  0.4 mg Daily with food     PRN Meds  . albuterol  2 puff Q4H PRN   . dextrose  12.5 g PRN   . docusate sodium  250 mg BID PRN   . glucagon  1 mg Once PRN   . glucose  4 tablet PRN   . glucose  1 Tube PRN   . HYDROmorphone  0.5 mg Q4H PRN   . nalOXone  0.1 mg Q2 Min PRN   . ondansetron  4 mg Q6H PRN   . oxyCODONE  10 mg Q4H PRN   . oxyCODONE  5 mg Q4H PRN   . sodium chloride (PF)  3 mL PRN   . sodium chloride   Continuous PRN     IV Meds  . sodium chloride     . sodium chloride 75 mL/hr at 03/29/16 0209         Objective:  Vital Signs:   Latest Entry  Range (last 24 hours)    Temperature: 97.7 F (36.5 C)  Temp  Avg: 97.9 F (36.6 C)  Min: 97.5 F (36.4 C)  Max: 98.2 F (36.8 C)    Blood pressure (BP): 129/78  BP  Min: 110/63  Max: 211/108    Heart Rate: 85  Pulse  Avg: 86  Min: 78  Max: 92    Respirations: 18  Resp  Avg: 17.5  Min: 16  Max: 20    SpO2: 97 %  SpO2  Avg: 97 %  Min: 96 %  Max: 98 %       No Data Recorded       Intake/Output (Last day):  12/09 0600 - 12/10 0559  In: 210 [P.O.:200; I.V.:10]  Out: 700 [Urine:700]      Physical Exam:  General: WDWN. NAD. A&0x4  Pulm:  Non-labored on room air  Abdomen: Soft. NTND. No rebound, guarding.   Mild CVAT  Extremities: No  c/c/e. SCDs bilaterally.    Laboratory data:   Recent Labs      03/27/16   0823  03/28/16   1151   WBC  8.1  7.9   RBC  4.05  4.08   HGB  13.0  13.5   HCT  40.8  41.3   MCV  100.7*  101.2*   MCHC  31.9*  32.7   RDW  13.7  13.9   PLT  219  223   MPV  10.5  10.8   SEG  61  72   LYMPHS  26  16   MONOS  11  10   EOS  1  2   BASOS  1  1     Recent Labs      03/27/16   0822  03/28/16   1151   NA  141  143   K  4.5  4.8   CL  104  106   BICARB  26  26   BUN  23*  19   CREAT  0.90  0.91   GLU  102*  117*   [place]  9.0  9.6   ALB   --   4.2     Recent Labs      03/28/16   1151   TBILI  0.25   TP  6.9   AST  15   ALK  71   ALT  14     No results for input(s): PT, PTT, INR in the last 72 hours.    Lab Results   Component Value Date    COLORUA Amber 03/28/2016    APPEARUA Cloudy 03/28/2016    GLUCOSEUA Negative 03/28/2016    BILIUA Negative 03/28/2016    KETONEUA Negative 03/28/2016    SGUA 1.010 03/28/2016    BLOODUA 3+ (A) 03/28/2016    PHUA 7.0 03/28/2016    PROTEINUA 2+ (A) 03/28/2016    UROBILUA 2+ (A) 03/28/2016    NITRITEUA Positive (A) 03/28/2016    LEUKESTUA Negative 03/28/2016    WBCUA 3-5 (A) 03/28/2016    RBCUA >50 (A) 03/28/2016       Microbiology:  UCx pending    Imaging:  66m left UVJ stone    Assessment and Plan:  KMerced Broughamis a 55year old female s/p b/l URS with left UVJ stone    Patient has improved pain, but still has pain with urination in the flank and in pelvis. Still no evidence of passing the stone    Will trial PO pain meds and continue ME therapy  If unable to be tolerated may need stent        Will discuss with Dr. SDennard SchaumannH. PPosey Pronto M.D.  [facility]  PGY3  [phone]

## 2016-03-29 NOTE — Interdisciplinary (Signed)
Pt is refusing to take [facility], saying that she doesn't need or want it and that she only takes her maintenance inhaler, Advair, at home when the weather is really bad. I explained how Advair is a maintenance medication and it should be taken daily. She pleasantly told me that she simply doesn't want to take [facility] during her hospital stay. The first call was paged to please D/C order, so we do not continue to bug pt Q12 about Dulera.

## 2016-03-29 NOTE — Plan of Care (Incomplete)
Problem: Pain - Acute  Goal: Communication of presence of pain  Outcome: Progressing toward goal, anticipate improvement over: >48 hours  Medicated pt for pain prn. Pt verbalized pain level went down to 3/10 an hour after med was given. Pt is currently resting in bed at this time watching tv.

## 2016-03-29 NOTE — Plan of Care (Signed)
Problem: Pain - Acute  Goal: Communication of presence of pain  Outcome: Goal Met  Pt has been c/o L flank pain today.  Medicated pt w/ scheduled Tyleonl, Ibuprofen and PRN Oxycodone. Re-assessed pt an hour after med was given. Pt verbalized pain level went down to 3/10 w/ her current meds. Pt aware to call when  pain med is needed. Goal ongoing.   Goal: Control of acute pain  Outcome: Goal Met

## 2016-03-30 ENCOUNTER — Observation Stay (HOSPITAL_COMMUNITY): Payer: BC Managed Care – PPO | Admitting: Certified Registered Nurse Anesthetist

## 2016-03-30 ENCOUNTER — Encounter (HOSPITAL_COMMUNITY): Admission: EM | Disposition: A | Discharge status: Home Health | Payer: Self-pay | Attending: Emergency Medicine

## 2016-03-30 ENCOUNTER — Observation Stay (HOSPITAL_BASED_OUTPATIENT_CLINIC_OR_DEPARTMENT_OTHER): Payer: BC Managed Care – PPO

## 2016-03-30 DIAGNOSIS — Z9889 Other specified postprocedural states: Secondary | ICD-10-CM

## 2016-03-30 DIAGNOSIS — N1330 Unspecified hydronephrosis: Secondary | ICD-10-CM

## 2016-03-30 DIAGNOSIS — Z87442 Personal history of urinary calculi: Secondary | ICD-10-CM

## 2016-03-30 DIAGNOSIS — Z3202 Encounter for pregnancy test, result negative: Secondary | ICD-10-CM

## 2016-03-30 DIAGNOSIS — N2889 Other specified disorders of kidney and ureter: Secondary | ICD-10-CM

## 2016-03-30 LAB — GLYCOSYLATED HGB(A1C), BLOOD: Glyco Hgb (A1C): 5.3 % (ref 4.8–5.8)

## 2016-03-30 LAB — URINE HCG: HCG, Urine: NEGATIVE

## 2016-03-30 LAB — URINE CULTURE: Urine Culture Result: NO GROWTH

## 2016-03-30 SURGERY — URETEROSCOPY, NON-STONE
Anesthesia: General | Site: Ureter | Laterality: Left | Wound class: Class II (Clean Contaminated)

## 2016-03-30 MED ORDER — LACTATED RINGERS IV SOLN
INTRAVENOUS | Status: DC
Start: 2016-03-30 — End: 2016-03-30

## 2016-03-30 MED ORDER — FENTANYL CITRATE (PF) 100 MCG/2ML IJ SOLN
50.0000 ug | INTRAMUSCULAR | Status: DC | PRN
Start: 2016-03-30 — End: 2016-03-30
  Administered 2016-03-30: 50 ug via INTRAVENOUS
  Filled 2016-03-30: qty 2

## 2016-03-30 MED ORDER — DEXAMETHASONE SODIUM PHOSPHATE 4 MG/ML IJ SOLN (CUSTOM)
INTRAMUSCULAR | Status: DC | PRN
Start: 2016-03-30 — End: 2016-03-30
  Administered 2016-03-30: 6 mg via INTRAVENOUS

## 2016-03-30 MED ORDER — LIDOCAINE HCL (PF) 1 % IJ SOLN
0.1000 mL | Freq: Once | INTRAMUSCULAR | Status: DC | PRN
Start: 2016-03-30 — End: 2016-03-30

## 2016-03-30 MED ORDER — PROPOFOL 200 MG/20ML IV EMUL
INTRAVENOUS | Status: DC | PRN
Start: 2016-03-30 — End: 2016-03-30
  Administered 2016-03-30: 200 mg via INTRAVENOUS

## 2016-03-30 MED ORDER — FENTANYL CITRATE (PF) 100 MCG/2ML IJ SOLN
25.0000 ug | INTRAMUSCULAR | Status: DC | PRN
Start: 2016-03-30 — End: 2016-03-30

## 2016-03-30 MED ORDER — SUCCINYLCHOLINE CHLORIDE 20 MG/ML IJ SOLN
INTRAMUSCULAR | Status: DC | PRN
Start: 2016-03-30 — End: 2016-03-30
  Administered 2016-03-30: 80 mg via INTRAVENOUS

## 2016-03-30 MED ORDER — ROCURONIUM BROMIDE 100 MG/10ML IV SOLN
INTRAVENOUS | Status: DC | PRN
Start: 2016-03-30 — End: 2016-03-30
  Administered 2016-03-30: 10 mg via INTRAVENOUS

## 2016-03-30 MED ORDER — LIDOCAINE HCL (CARDIAC) 20 MG/ML IV SOLN
INTRAVENOUS | Status: DC | PRN
Start: 2016-03-30 — End: 2016-03-30
  Administered 2016-03-30: 100 mg via INTRAVENOUS

## 2016-03-30 MED ORDER — MEPERIDINE HCL 25 MG/ML IJ SOLN
12.5000 mg | INTRAMUSCULAR | Status: DC | PRN
Start: 2016-03-30 — End: 2016-03-30

## 2016-03-30 MED ORDER — KETOROLAC TROMETHAMINE 30 MG/ML IJ SOLN
INTRAMUSCULAR | Status: DC | PRN
Start: 2016-03-30 — End: 2016-03-30
  Administered 2016-03-30: 30 mg via INTRAVENOUS

## 2016-03-30 MED ORDER — LACTATED RINGERS IV SOLN
INTRAVENOUS | Status: DC | PRN
Start: 2016-03-30 — End: 2016-03-30
  Administered 2016-03-30: 17:00:00 via INTRAVENOUS

## 2016-03-30 MED ORDER — DIPHENHYDRAMINE HCL 50 MG/ML IJ SOLN
12.5000 mg | Freq: Once | INTRAMUSCULAR | Status: DC | PRN
Start: 2016-03-30 — End: 2016-03-30

## 2016-03-30 MED ORDER — NALOXONE HCL 0.4 MG/ML IJ SOLN
0.1000 mg | INTRAMUSCULAR | Status: DC | PRN
Start: 2016-03-30 — End: 2016-03-30

## 2016-03-30 MED ORDER — KETOROLAC TROMETHAMINE 15 MG/ML IJ SOLN
15.0000 mg | Freq: Four times a day (QID) | INTRAMUSCULAR | Status: DC
Start: 2016-03-31 — End: 2016-03-30

## 2016-03-30 MED ORDER — FENTANYL CITRATE (PF) 250 MCG/5ML IJ SOLN
INTRAMUSCULAR | Status: DC | PRN
Start: 2016-03-30 — End: 2016-03-30
  Administered 2016-03-30: 50 ug via INTRAVENOUS
  Administered 2016-03-30: 150 ug via INTRAVENOUS
  Administered 2016-03-30: 50 ug via INTRAVENOUS

## 2016-03-30 MED ORDER — IOHEXOL 240 MG/ML IJ SOLN
INTRAMUSCULAR | Status: DC | PRN
Start: 2016-03-30 — End: 2016-03-30
  Administered 2016-03-30: 5 mL via INTRAVENOUS

## 2016-03-30 MED ORDER — SODIUM CHLORIDE 0.9 % IV SOLN
12.5000 mg | Freq: Once | INTRAVENOUS | Status: DC | PRN
Start: 2016-03-30 — End: 2016-03-30

## 2016-03-30 MED ORDER — LABETALOL HCL 5 MG/ML IV SOLN
5.0000 mg | INTRAVENOUS | Status: DC | PRN
Start: 2016-03-30 — End: 2016-03-30
  Administered 2016-03-30: 5 mg via INTRAVENOUS
  Filled 2016-03-30: qty 4

## 2016-03-30 MED ORDER — LIDOCAINE HCL (PF) 1 % IJ SOLN
0.1000 mL | INTRAMUSCULAR | Status: DC | PRN
Start: 2016-03-30 — End: 2016-03-30

## 2016-03-30 MED ORDER — MIDAZOLAM HCL 2 MG/2ML IJ SOLN
INTRAMUSCULAR | Status: DC | PRN
Start: 2016-03-30 — End: 2016-03-30
  Administered 2016-03-30: 2 mg via INTRAVENOUS

## 2016-03-30 MED ORDER — ONDANSETRON HCL 4 MG/2ML IV SOLN
4.0000 mg | Freq: Once | INTRAMUSCULAR | Status: DC | PRN
Start: 2016-03-30 — End: 2016-03-30

## 2016-03-30 MED ORDER — HYDROCORTISONE 2.5 % EX CREA
TOPICAL_CREAM | Freq: Two times a day (BID) | CUTANEOUS | Status: DC | PRN
Start: 2016-03-30 — End: 2016-03-30
  Administered 2016-03-30: 12:00:00 1 via TOPICAL
  Filled 2016-03-30: qty 30

## 2016-03-30 MED ORDER — HYDROMORPHONE HCL 1 MG/ML IJ SOLN
0.5000 mg | INTRAMUSCULAR | Status: DC | PRN
Start: 2016-03-30 — End: 2016-03-30

## 2016-03-30 SURGICAL SUPPLY — 31 items
ADAPTOR UROLOCK (Misc Medical Supply) IMPLANT
BASKET STONE CONE RTRVL 7MM (Misc Medical Supply)
BASKET ZERO TIP NITINOL 1.9FR Ø12MM X 120CM (Procedural wires/sheaths/catheters/balloons/dilators) ×2
CATHETER URETERAL OPEN END 5FR X 70CM (Drains/Catheter/Tubes/Reservoir)
CATHETER URETHRAL DUAL LUMEN 10 FR X 54 CM (Drains/Catheter/Tubes/Reservoir) IMPLANT
CONTAINER PRECISION SPECIMEN, 4OZ- STERILE (Misc Medical Supply) IMPLANT
DRAPE X-RAY C ARM 41" X 74" (Drapes/towels) IMPLANT
EXTRACTOR STONE NCOMPASS 1.7 FR 115CM 4-WIRE BASKET (Procedural wires/sheaths/catheters/balloons/dilators) IMPLANT
EXTRACTOR STONE NITINOL NGAGE 1.7FR Ø11MM X 115CM (Procedural wires/sheaths/catheters/balloons/dilators) IMPLANT
GLOVE BIOGEL INDICATOR UNDERGLOVE SIZE 7.5 (Gloves/gowns) IMPLANT
GLOVE BIOGEL PI ULTRATOUCH SIZE 7.5 (Gloves/gowns) ×6 IMPLANT
GLOVE BIOGEL PI ULTRATOUCH SIZE 8 (Gloves/gowns) ×4
GLOVE SURGEON BIOGEL SIZE 7.5 (Gloves/gowns) IMPLANT
GOWN SIRUS XLG BLUE, AAMI LVL 4 (Gloves/gowns) IMPLANT
GOWN SURGICAL XXLG BLUE (Gloves/gowns) ×2
GUIDEWIRE SENSOR DUAL FLEX STRAIGHT TIP .035 X 150CM NITINOL (Procedural wires/sheaths/catheters/balloons/dilators) ×2 IMPLANT
HOLDER FOLEY CATH SECUREMENT DEVICE STATLOCK (Misc Medical Supply) IMPLANT
PROTECTOR ULNAR NERVE PAD, YELLOW (Misc Medical Supply) ×2
SHEATH URETERAL NAVAGATOR HD 12/14 FR X 36CM (Procedural wires/sheaths/catheters/balloons/dilators) IMPLANT
SHEATH URETERAL NAVAGATOR HD 13/15 FR X 36CM (Procedural wires/sheaths/catheters/balloons/dilators) IMPLANT
SHEATH URETERAL NAVAGATOR HD 13/15 FR X 46CM (Procedural wires/sheaths/catheters/balloons/dilators)
SHEATH URETERAL NAVIGATOR HD 12/14FR X 46CM (Procedural wires/sheaths/catheters/balloons/dilators)
SLEEVE SCD KNEE MEDIUM (Misc Medical Supply) ×2 IMPLANT
SOLUTION IRR BAG .9% N/S 3000ML (Non-Pharmacy Meds/Solutions) ×4
SOLUTION IRR POUR BTL 0.9% NS 1000ML (Non-Pharmacy Meds/Solutions) IMPLANT
SOLUTION IRR POUR BTL H20 1000ML (Non-Pharmacy Meds/Solutions) ×2
SURGICAL PACK CYSTO - SAME DAY (Procedure Packs/kits) ×6
SYRINGE CATH TIP 60ML (Misc Surgical Supply) IMPLANT
TOWELS OR BLUE 4-PACK STERILE, DISPOSABLE (Drapes/towels) IMPLANT
TRAY FOLEY SURESTEP LUBRI-SIL I.C.16FR URIMETER, LF (Lines/Drains) IMPLANT
TUBING SUCTION MEDI-VAC 9/32" X 20' (Tubing/suction) ×2 IMPLANT

## 2016-03-30 NOTE — Plan of Care (Signed)
Problem: Pain - Acute  Goal: Communication of presence of pain  Outcome: Goal Met  Patient continues to experience pain so I advised her to notify me when she needed pain medication and she verbalized/demonstrated understanding. Also educated pt regarding type of pain medication and possible side effects. Will continue to assess/reassess pt's level of pain.

## 2016-03-30 NOTE — Anesthesia Preprocedure Evaluation (Addendum)
ANESTHESIA PRE-OPERATIVE EVALUATION    Patient Information    Name: Melanie Walsh    MRN: [id number]    DOB: 1960/09/02    Age: 55 year old    Sex: female  Procedure(s):  LT URETEROSCOPY STONE EXTRACTION       BP 147/87 (BP Location: Left arm, BP Patient Position: Semi-Fowlers)  Pulse 70  Temp 36.6 C  Resp 20  Ht _0  (1.626 m)  Wt 80.9 kg (178 lb 5.6 oz)  LMP 03/31/2007 (Approximate)  SpO2 96%  BMI 30.61 kg/m2   BMI kg/m2: 30.61 kg/m2    Primary language spoken:  English    ROS/Medical History:    Patient summary reviewed    General Review & History of Anesthetic Complications:    - negative ROS    Pt has had GA before; no problems      TELEPHONE INTERVIEW-NEEDS PHYSICAL Cardiovascular:    Exercise tolerance: >4 METS (Treadmill 3 x wk 1 hr,     Denies CP SOB with physical exertion  )  (+)    hypertension           Pulmonary:     (+) asthma ( stable on inhalers, no admission or intubations), , , , ,    Hematology/Oncology:       ROS Comments  Hx of DVT 2007 -resolved     Neuro/Psych:        (+) neuromuscular disease (Tigeminal neuralgia,  left side no numbenss ),      Infectious Disease:   Negative ROS         Endo/Other:      (+) hypothyroidism ( replacement. last dose change 2010),     ROS Comments: Pre DM- on Metformin A1c ordered dos   GI/Hepatic:     (+) GERD ( PRN OTC) well controlled,    Renal:           ROS comments Kidney stone- procedure pending    Hx Medullary Kidney disease       Pregnancy History:             Pre Anesthesia Testing (PCC/CPC) notes/comments:    [facility] Test & records reviewed by [facility] provider    :                    Physical Exam    Airway:  Inter-inciser distance > 4 cm  Prognanth Able    Mallampati: II  Neck ROM: full  TM distance: 5-6 cm  Short thick neck: No        Cardiovascular:  - cardiovascular exam normal         Pulmonary:  - pulmonary exam normal           Neuro/Neck/Skeletal/Skin:  - [facility] ANE PHYS EXAM NEGATIVE ROS SKIN SKELETAL NEURO NECK          Dental:      Abdominal:   -  normal exam         Additional Clinical Notes:               Last  OSA (STOP BANG) Score:  No Data Recorded    Last OSA  (STOP) Score for   No Data Recorded      Has a physician diagnosed you with sleep apnea?: No  Do you use a CPAP at home?: No  OSA total score (A score of 2 or more is high risk. Offer patient sleep study.): 1  Past Medical History:   Diagnosis Date   . Asthma    . HTN (hypertension)    . Kidney disease, medullary sponge    . Trigeminal neuralgia      Past Surgical History:   Procedure Laterality Date   . ANTERIOR CRUCIATE LIGAMENT REPAIR     . appendix     . HYSTERECTOMY     . OSTEOTOMY     . SHOULDER SURGERY       Social History   Substance Use Topics   . Smoking status: Never Smoker   . Smokeless tobacco: Never Used   . Alcohol use Yes      Comment: rare       Current Facility-Administered Medications   Medication Dose Route Frequency Provider Last Rate Last Dose   . [MAR Hold] acetaminophen (TYLENOL) tablet 650 mg  650 mg Oral Q6H Vilinda Blanks, MD   650 mg at 03/30/16 1146   . [MAR Hold] albuterol 108 (90 BASE) MCG/ACT inhaler 2 puff  2 puff Inhalation Q4H PRN Vilinda Blanks, MD       . Doug Sou Hold] amLODIPINE (NORVASC) tablet 5 mg  5 mg Oral Daily Vilinda Blanks, MD   5 mg at 03/30/16 0810   . [MAR Hold] cefTRIAXone (ROCEPHIN) 1,000 mg in sterile water (PF) 10 mL IV  1,000 mg IntraVENOUS Q24H NR Vilinda Blanks, MD   1,000 mg at 03/29/16 1731   . dextrose-sodium chloride 5%-0.45% infusion   IntraVENOUS Continuous Vilinda Blanks, MD   Stopped at 03/30/16 1610   . [MAR Hold] docusate sodium (COLACE) capsule 250 mg  250 mg Oral BID PRN Vilinda Blanks, MD       . Doug Sou Hold] hydrochlorothiazide (HYDRODIURIL) tablet 50 mg  50 mg Oral Daily Vilinda Blanks, MD   50 mg at 03/30/16 1221   . [MAR Hold] hydrocortisone 2.5 % cream   Topical BID PRN Janace Hoard, MD   1 Application at 37/62/83 1146   . [MAR Hold] HYDROmorphone (DILAUDID) injection 0.5 mg  0.5 mg  IntraVENOUS Q4H PRN Vilinda Blanks, MD   0.5 mg at 03/29/16 2110   . [MAR Hold] ketorolac (TORADOL) injection 15 mg  15 mg IntraVENOUS Q6H Vilinda Blanks, MD   15 mg at 03/30/16 1146   . lactated ringers infusion   IntraVENOUS Continuous Eugenia Pancoast, MD       . lactated ringers infusion   IntraVENOUS Continuous Vivien Rota, NP       . Doug Sou Hold] levothyroxine (SYNTHROID) tablet 100 mcg  100 mcg Oral QAM AC Vilinda Blanks, MD   100 mcg at 03/30/16 0501   . lidocaine, PF 1% injection 0.1 mL  0.1 mL IntraDERMAL Once PRN Eugenia Pancoast, MD       . lidocaine, PF 1% injection 0.1 mL  0.1 mL IntraDERMAL Q5 Min PRN Locke, Delila Pereyra, NP       . Doug Sou Hold] nalOXone [facility]) injection 0.1 mg  0.1 mg IntraVENOUS Q2 Min PRN Vilinda Blanks, MD       . Doug Sou Hold] ondansetron [facility]) injection 4 mg  4 mg IntraVENOUS Q6H PRN Vilinda Blanks, MD   4 mg at 03/29/16 2048   . [MAR Hold] oxyCODONE (ROXICODONE) tablet 10 mg  10 mg Oral Q4H PRN Vilinda Blanks, MD   10 mg at 03/30/16 1549   . [MAR Hold] oxyCODONE (ROXICODONE) tablet 5 mg  5  mg Oral Q4H PRN Vilinda Blanks, MD       . Doug Sou Hold] sodium chloride (PF) 0.9 % flush 3 mL  3 mL IntraVENOUS Q8H Vilinda Blanks, MD   3 mL at 03/30/16 1319   . [MAR Hold] sodium chloride (PF) 0.9 % flush 3 mL  3 mL IntraVENOUS PRN Vilinda Blanks, MD       . Doug Sou Hold] sodium chloride 0.9 % TKO infusion   IntraVENOUS Continuous PRN Vilinda Blanks, MD       . Doug Sou Hold] tamsulosin [facility]) capsule 0.4 mg  0.4 mg Oral Daily with food Vilinda Blanks, MD   0.4 mg at 03/30/16 0810     Allergies   Allergen Reactions   . Adhesive Tape Rash   . Hydrocodone-Acetaminophen Rash     Vomiting, nightmares   . Latex Rash   . Morphine Shortness of Breath and Swelling   . Nickel Rash     Swelling and rash   . Sulfa Drugs Rash     Related topical skin cream   . Erythromycin Nausea Only       Labs and Other Data  Lab Results   Component Value Date       NA 142 03/29/2016    K 4.1 03/29/2016    CL 105 03/29/2016    BICARB 27 03/29/2016    BUN 16 03/29/2016    CREAT 0.61 03/29/2016    GLU 106 03/29/2016    [place] 8.8 03/29/2016     Lab Results   Component Value Date    AST 15 03/28/2016    ALT 14 03/28/2016    ALK 71 03/28/2016    TP 6.9 03/28/2016    ALB 4.2 03/28/2016    TBILI 0.25 03/28/2016     Lab Results   Component Value Date    WBC 7.9 03/28/2016    RBC 4.08 03/28/2016    HGB 13.5 03/28/2016    HCT 41.3 03/28/2016    MCV 101.2 03/28/2016    MCHC 32.7 03/28/2016    RDW 13.9 03/28/2016    PLT 223 03/28/2016    MPV 10.8 03/28/2016    SEG 72 03/28/2016    LYMPHS 16 03/28/2016    MONOS 10 03/28/2016    EOS 2 03/28/2016    BASOS 1 03/28/2016     No results found for: INR, PTT  No results found for: ARTPH, ARTPO2, ARTPCO2    Anesthesia Plan:  Risks and Benefits of Anesthesia  I personally examined the patient immediately prior to the anesthetic and reviewed the pertinent medical history, drug and allergy history, laboratory and imaging studies and consultations. I have determined that the patient has had adequate assessment and testing.    Anesthetic techniques, invasive monitors, anesthetic drugs for induction, maintenance and post-operative analgesia, risks and alternatives have been explained to the patient and/or patient's representatives.    I have prescribed the anesthetic plan:         Planned anesthesia method: General         ASA 2 (Mild systemic disease)     Potential anesthesia problems identified and risks including but not limited to the following were discussed with patient and/or patient's representative: Adverse or allergic drug reaction, Dental injury or sore throat, Nerve injury and Recall    Planned monitoring method: Routine monitoring  Comments: (H&P reviewed.  Plan GA.)    Informed Consent:  Anesthetic plan and risks discussed with Patient.  Plan discussed with CRNA, Surgeon and Attending.

## 2016-03-30 NOTE — Interdisciplinary (Signed)
O[place]e pt has a raised, reddened area on the inner part of her upper left arm.  She stated that she noticed it when she was discharged after surgery on Thursday and it only started getting worse and itching yesterday.  She's not sure if maybe something with latex was used on her arm in surgery because it's similar to her usual allergic reaction.

## 2016-03-30 NOTE — Discharge Instructions (Signed)
Discharge instructions  You had a procedure performed on your left ureter. This may cause an increase in the number of times you urinate, blood in urine, and burning on urination. Take pain medication or Tylenol for your discomfort.     You are leaving despite the plan for observation after your surgery - you may experience pain, fevers, chills, nausea, vomiting and should return to the hospital if you experience any of these symptoms.     Medications   OK to restart medications as indicated on your discharge list. You have previously been provided narcotics pain medication for pain. If tylenol is enough, take that. Pain medication can cause constipation. Take stool softener (Docusate) while taking pain medication. Do not operate heavy machinery or drive while taking pain medication.    Please seek immediate medical attention or come to the emergency department if you experience fevers >101.4, pain not controlled by pain meds and intractable nausea and vomiting, chest pain or shortness of breath, inability to urinate.    Expect to see blood tinged urine after this procedure. As long as you are able to urinate, this should resolve on its own.    You will follow up with [place] in clinic. Someone will call you to schedule this appointment.     [facility]   If you have any questions about your urology care, medications, or if you have new or concerning symptoms soon after going home and you need to contact your hospital physician, You can do so in the following manner:      Business Hours (Monday-Friday; 08:00AM - 4:30PM; Non-Urgent):  [facility]  Phone: [phone]  Fax: [phone]    Clinic scheduler  Veda Canning  Tel: [phone]    If you need to be scheduled for clinic, call the number above to schedule your follow-up appointment if this has not been scheduled 2 business days after you leave the hospital    After-Hours, Weekends/Holidays, and Urgent:  FOR URGENT ISSUES ONLY, call the [facility]  operator at [phone] and have them page the "On-Call Urologist"

## 2016-03-30 NOTE — Addendum Note (Signed)
Addendum  created 03/30/16 1806 by Jeremy Johann, CRNA    Anesthesia Intra Meds edited

## 2016-03-30 NOTE — Progress Notes (Signed)
UROLOGY PROGRESS NOTE    Patient Name: Melanie Walsh MRN: [id number]    Hospital Day:   0 days - Admitted on: 03/28/2016    ID:  51 F s/p B URS/HLL 03/26/16 p/w pain related to 3 mm L UVJ stone on RUS.    Events/Subjective:  NAEON. Still c/o left flank pain requiring narcotic. Does not believe stone has passed. Tolerating diet. Denies f/c/dysuria.    Scheduled Meds  . acetaminophen  650 mg Q6H   . amLODIPINE  5 mg Daily   . cefTRIAXone (ROCEPHIN) IV  1,000 mg Q24H NR   . hydrochlorothiazide  50 mg Daily   . ketorolac  15 mg Q6H   . levothyroxine  100 mcg QAM AC   . sodium chloride (PF)  3 mL Q8H   . tamsulosin  0.4 mg Daily with food     PRN Meds  . albuterol  2 puff Q4H PRN   . docusate sodium  250 mg BID PRN   . hydrocortisone   BID PRN   . HYDROmorphone  0.5 mg Q4H PRN   . nalOXone  0.1 mg Q2 Min PRN   . ondansetron  4 mg Q6H PRN   . oxyCODONE  10 mg Q4H PRN   . oxyCODONE  5 mg Q4H PRN   . sodium chloride (PF)  3 mL PRN   . sodium chloride   Continuous PRN     IV Meds  . dextrose-sodium chloride 5%-0.45% 75 mL/hr at 03/29/16 2332   . sodium chloride         Vitals:   Latest Entry  Range (last 24 hours)    Temperature: 98 F (36.7 C)  Temp  Avg: 98.2 F (36.8 C)  Min: 97.8 F (36.6 C)  Max: 98.5 F (36.9 C)    Blood pressure (BP): 158/86  BP  Min: 129/70  Max: 158/86    Heart Rate: 72  Pulse  Avg: 77.9  Min: 72  Max: 86    Respirations: 18  Resp  Avg: 18  Min: 16  Max: 20    SpO2: 98 %  SpO2  Avg: 97.3 %  Min: 97 %  Max: 98 %       No Data Recorded     Intake/Output:  Intake/Output       03/29/16 0600 - 03/30/16 0559 03/30/16 0600 - 03/31/16 0559      9811-9147 1800-0559 Total 0600-1759 8295-6213 Total       Intake    P.O.  800  -- 800  --  -- --    I.V.  _0 --  -- --    Total Intake 813 6 819 -- -- --       Output    Urine  1050  400 1450  --  -- --    Stool  1  -- 1  --  -- --    Total Output 1051 400 1451 -- -- --       Net I/O     -238 -394 -632 -- -- --          Physical Exam:  GEN: NAD  NEURO:  AOx3  HEENT: normocephalic, atraumatic  PULM: normal effort of breathing.   CV: Warm well perfused.  ABD: soft, NT/ND  GU: +L CVAT    Labs:  Recent Labs      03/27/16   0823  03/28/16   1151   WBC  8.1  7.9   RBC  4.05  4.08   HGB  13.0  13.5   HCT  40.8  41.3   MCV  100.7*  101.2*   MCHC  31.9*  32.7   RDW  13.7  13.9   PLT  219  223   MPV  10.5  10.8   SEG  61  72   LYMPHS  26  16   MONOS  11  10   EOS  1  2   BASOS  1  1     Recent Labs      03/28/16   1151  03/29/16   0516   NA  143  142   K  4.8  4.1   CL  106  105   BICARB  26  27   BUN  19  16   CREAT  0.91  0.61   GLU  117*  106*   [place]  9.6  8.8   ALB  4.2   --      Recent Labs      03/28/16   1151   TBILI  0.25   TP  6.9   AST  15   ALK  71   ALT  14     No results for input(s): PT, PTT, INR in the last 72 hours.    Lab Results   Component Value Date    COLORUA Amber 03/28/2016    APPEARUA Cloudy 03/28/2016    GLUCOSEUA Negative 03/28/2016    BILIUA Negative 03/28/2016    KETONEUA Negative 03/28/2016    SGUA 1.010 03/28/2016    BLOODUA 3+ (A) 03/28/2016    PHUA 7.0 03/28/2016    PROTEINUA 2+ (A) 03/28/2016    UROBILUA 2+ (A) 03/28/2016    NITRITEUA Positive (A) 03/28/2016    LEUKESTUA Negative 03/28/2016    WBCUA 3-5 (A) 03/28/2016    RBCUA >50 (A) 03/28/2016       Radiology:   RUS reviewed.    Microbiology:  03/28/16 UCx NGTD    ASSESSMENT / PLAN:  68 F s/p B URS/HLL returns with symptomatic 3 mm L UVJ stone. Added on for URS/HLL today. Consent in chart.     - pain control  - maintain NPO for procedure  - mIVF 75 cc/h  - no abx for UCx NGTD  - hydrocortisone for arm rash    The patient was examined and discussed with Dr. Barnet Pall, Chief Resident. Attending of record is Dr. Mickel Fuchs.    ---  Abelina Bachelor. Jacqlyn Larsen, [facility], PGY2  Pager [phone]

## 2016-03-30 NOTE — Op Note (Signed)
PREOPERATIVE DIAGNOSIS: Possible left distal ureteral calculus    POSTOPERATIVE DIAGNOSIS: Distal left ureteral edema with no evidence of stones    PROCEDURE PERFORMED  1. Left ureteroscopy  2. Cystoscopy with left retrograde pyelogram.  3. Supervision and interpretation of retrograde pyelogram images.    SURGEON/STAFF: Cornell Bourbon MD    ASSISTANTMaryelizabeth Kaufmann MD    ANESTHESIA: General.    INDICATIONS: Patient is a 55 year old female with a past medical history of bilateral nephrolithiasis. She underwent bilateral ureteroscopy with holmium laser lithotripsy one week ago. Per patient wishes, she did not want stents placed after the procedure as she suffered from severe intolerance previously. She understood there was risk of ureteral obstruction either from edema or obstructing calculi and she accepted this risk. On POD #2, she returned to the ER with severe left flank pain. An [place] in the ER confirmed left hydronephrosis with absent ureteral jet and a possible distal left ureteral calculus measuring 3 mm. She was observed on IV pain medications and medical expulsive therapy. Her pain persisted for several days and patient did no feel that she passed a stone. As such, we opted to proceed with left ureteroscopy to confirm patency and absence of stones. She understood the risks of the procedure and again did not consent for a left ureteral stent. She realized that this may mean she would have persistent left-sided pain while edema resolves and may require a secondary procedure, namely left PCN placement. She agreed to this.    FINDINGS: No evidence of a distal ureteral calculus. Significant edema and mucosal irritation of distal left ureter. No evidence of stones in the kidney.    PROCEDURE: After informed consent was obtained, the patient was  taken to operating room and general anesthetic was  induced. They was then placed in a dorsal lithotomy position,  prepped and draped in a sterile fashion. A  21-French rigid  cystoscope was utilized to place a safety wire into the left  collecting system. Along-side the safety wire, a semi-rigid ureteroscope was introduced and the ureteral orifice was cannulated. It did appear irritated but patent. The distal left ureter was narrowed by edema. There was a small amount of mucosal slough but no stones were identified.After the first 3-4 cm of distal ureter were bypassed, the remainder of the ureter was normal in caliber and appearance. We passed our semi-rigid scope to the level of the UPJ without issue then called for a flexible digital ureteroscope. This was introduced over our safety wire. A retrograde pyelogram was performed through our scope. There was minimal calyceal blunting and no overt hydronephrosis. This was used for a road-map and we proceeded to inspect each calyx. No stones were identified. The scope was removed under direct visualization, the bladder was emptied, and the patient was awakened having tolerated the procedure well.     ESTIMATED BLOOD LOSS: None.    TOTAL FLUIDS GIVEN: 1L    SPECIMENS: Renal calculi.    DISPOSITION: Extubated and transferred to recovery. The patient will be monitored overnight with IV toradol and flomax. If she has persistent pain related to ureteral edema she will likely need a PCN tube placed. She is aware of this possibility

## 2016-03-30 NOTE — Discharge Summary (Signed)
Patient Name:  Melanie Walsh    Principal Diagnosis (required):  Nephrolithiasis      Hospital Problem List (required):  Active Hospital Problems    Diagnosis   . *Nephrolithiasis [N20.0]      Resolved Hospital Problems    Diagnosis   No resolved problems to display.       Additional Hospital Diagnoses ("rule out" or "suspected" diagnoses, etc.):     none    Principal Procedure During This Hospitalization (required):  Endotracheal intubation  Left negative diagnostic ureteroscopy    Other Procedures Performed During This Hospitalization (required):  None    Procedure results are available in Chart Review in Epic.  For those providers external to [facility], the key procedure results are listed below:  BRIEF OPERATIVE NOTE    DATE: 03/30/2016  TIME: 6:01 PM    PREOPERATIVE DIAGNOSIS: Possible left ureteral stone    POSTOPERATIVE DIAGNOSIS: left ureteral edema, no ureteral calculi    PROCEDURE: Left retrograde pyelogram, left semi-rigid ureteroscopy, left flexible ureterorenoscopy    ATTENDING SURGEON: Papagiannopoulos  ASSISTANTS(s): Ballon-Landa    ANESTHESIA: General    FINDINGS: Distal left ureteral edema with some mucosal slough. Mid and proximal ureter appeared normal. Left flexible renoscopy revealed no evidence of stones. Per patient wishes, no stent placed    WOUND CLASSIFICATION:  Class II (clean contaminated)    WOUND CLOSURE STATUS:  Procedure(s):  LEFT URETEROSCOPY  - Wound Class: Class II (Clean Contaminated) - Incision Closure: No Layers    SPECIMENS: None    Fluids/Blood Products:      IV Fluids: 500    Blood Products: None    EBL: 0 ml    Urine Output: NR    COMPLICATIONS: None    DISPOSITION: Floor for observation. Per patient wishes, no stent placed. If she has persistent left flank pain related to mucosal edema and ureteral spasm, she may require a percutaneous nephrostomy tube. She and her husband are in agreement with the plan      Consultations Obtained During This  Hospitalization:  None    Key consultant recommendations:  n/a    Reason for Admission to the Hospital / History of Present Illness:  From admission H&P:  "History of Present Illness: Melanie Walsh is a 55 year old female with hx of nephrolithiasis, who underwent a b/l URS 03/26/16, stent-less, who presented to the ED with left flank pain, nausea and emesis    Pain at home was too much and uncontrolled with PO medications. In addition, she was unable to keep anything down.    She denies f/c/cp/sob    In terms of management while in the ED, the  patient has received the following tests/procedures/treatments while in the ED:  Renal US"    Hospital Course by Problem (required):  #nephrolithiasis  Pt was admitted with hydro s/p b/l URS and refractory flank pain with renal ultrasound demonstrating concern for L hydro and 54mm stone at UVJ thought to be a residual fragment. She was admitted for pain control and due to concerns for infection (+nitrite on UA) she underwent treatment with diagnostic ureteroscopy after abx treatment with CTX. She did have significant edema and debris in the distal ureter on ureteroscopy however no stone was visible or extracted.     She was planned to be kept for observation overnight given her prior stone episodes and refractory pain however she felt clinically improved and decided that she would recover at home. She was aware of the risks and the  reasons for observation and was clearly instructed regarding the reasons for return to the ED and the risks that she might do so. She accepted these risks and preferred to be at home.     Tests Outstanding at Discharge Requiring Follow Up:  None    Discharge Condition (required):  Improved.    Key Physical Exam Findings at Discharge:  No significant physical examination findings at the time of discharge.    Discharge Diet:  Regular.    Discharge Medications:     What To Do With Your Medications      CONTINUE taking these medications       Add'l  Info    amLODIPINE 5 MG tablet   Commonly known as:  NORVASC   Take 5 mg by mouth daily.    Refills:  0       * docusate sodium 250 MG capsule   Commonly known as:  COLACE   Take 1 capsule (250 mg) by mouth 2 times daily as needed for Constipation.    Quantity:  40 capsule   Refills:  0       * docusate sodium 250 MG capsule   Commonly known as:  COLACE   Take 1 capsule (250 mg) by mouth 2 times daily as needed for Constipation.    Quantity:  30 capsule   Refills:  0       fluticasone-salmeterol 100-50 MCG/DOSE inhaler   Commonly known as:  ADVAIR   Inhale 1 puff by mouth every 12 hours.    Refills:  0       hydrochlorothiazide 50 MG tablet   Commonly known as:  HYDRODIURIL   Take 50 mg by mouth daily.    Refills:  0       levalbuterol 45 MCG/ACT inhaler   Commonly known as:  XOPENEX   Inhale 2 puffs by mouth every 6 hours as needed for Wheezing.    Refills:  0       LEVOTHYROXINE SODIUM PO    Refills:  0       METFORMIN HCL PO   Take 200 mg by mouth daily.    Refills:  0       ONE DAILY CALCIUM/IRON PO    Refills:  0       oxyCODONE 5 MG immediate release tablet   Commonly known as:  ROXICODONE   Take 1 tablet (5 mg) by mouth every 4 hours as needed for Moderate Pain (Pain Score 4-6).    Quantity:  15 tablet   Refills:  0       phenazopyridine 100 MG tablet   Commonly known as:  PYRIDIUM   Take 2 tablets (200 mg) by mouth 3 times daily as needed (Bladder pain or dysuria).    Quantity:  21 tablet   Refills:  0       tamsulosin 0.4 MG capsule   Commonly known as:  FLOMAX   Take 1 capsule (0.4 mg) by mouth daily (with food).    Quantity:  30 capsule   Refills:  0       traMADol 50 MG tablet   Commonly known as:  ULTRAM   Take 1 tablet (50 mg) by mouth every 6 hours as needed for Moderate Pain (Pain Score 4-6).    Quantity:  15 tablet   Refills:  0       VITAMIN D PO    Refills:  0       *  Notice:  This list has 2 medication(s) that are the same as other medications prescribed for you. Read the directions carefully,  and ask your doctor or other care provider to review them with you.          Allergies:  Allergies   Allergen Reactions   . Adhesive Tape Rash   . Hydrocodone-Acetaminophen Rash     Vomiting, nightmares   . Latex Rash   . Morphine Shortness of Breath and Swelling   . Nickel Rash     Swelling and rash   . Sulfa Drugs Rash     Related topical skin cream   . Erythromycin Nausea Only       Discharge Disposition:  Home.    Discharge Code Status:  Full code / full care  This code status is not changed from the time of admission.    Follow Up Appointments:    Scheduled appointments:  No future appointments.    For appointments requested for after discharge that have not yet been scheduled, refer to the Post Discharge Referrals section of the After Visit Summary.    Discharging 45 Contact Information:  [facility] operator at [phone].    Darcey Nora, MD, MPH  [facility], PGY-3  Pager: [phone]

## 2016-03-30 NOTE — Brief Op Note (Signed)
BRIEF OPERATIVE NOTE    DATE: 03/30/2016  TIME: 6:01 PM    PREOPERATIVE DIAGNOSIS: Possible left ureteral stone    POSTOPERATIVE DIAGNOSIS: left ureteral edema, no ureteral calculi    PROCEDURE: Left retrograde pyelogram, left semi-rigid ureteroscopy, left flexible ureterorenoscopy    ATTENDING SURGEON: Santa Abdelrahman  ASSISTANTS(s): Ballon-Landa    ANESTHESIA: General    FINDINGS: Distal left ureteral edema with some mucosal slough. Mid and proximal ureter appeared normal. Left flexible renoscopy revealed no evidence of stones. Per patient wishes, no stent placed    WOUND CLASSIFICATION:  Class II (clean contaminated)    WOUND CLOSURE STATUS:  Procedure(s):  LEFT URETEROSCOPY  - Wound Class: Class II (Clean Contaminated) - Incision Closure: No Layers    SPECIMENS: None    Fluids/Blood Products:      IV Fluids: 500    Blood Products: None    EBL: 0 ml    Urine Output: NR    COMPLICATIONS: None    DISPOSITION: Floor for observation. Per patient wishes, no stent placed. If she has persistent left flank pain related to mucosal edema and ureteral spasm, she may require a percutaneous nephrostomy tube. She and her husband are in agreement with the plan

## 2016-03-30 NOTE — Interdisciplinary (Signed)
Discussed with pt that the doctor does not recommend for her to go home and would prefer to keep her overnight for observation.  She is aware of this and that they would like to monitor her for infection.  She is aware of what to watch for and states that she will come back to the ED should she develop fever, chills, pain not relieved with pain meds, intractable N/V, CP, SOB, etc.  She has follow up appointments scheduled with urology already and is aware that the clinic may call her for additional follow-up, per the DC instructions.  DC instructions reviewed with patient.  Pt has all belongings.  IV removed.  All questions addressed.  Pt taken downstairs via wheelchair to private vehicle driven by husband.  DC home with husband.  No prescriptions given.  Pt has all previous meds and also has remaining pain medications from her procedure last week.  Will take PRN per instructions.

## 2016-03-30 NOTE — Plan of Care (Signed)
Problem: Pain - Acute  Goal: Communication of presence of pain  Outcome: Goal Met  Pt able to effectively communicate pain to primary RN with appropriate use of call light.  Continue to assess for pain with each vital sign/PRN and administer medications for pain relief as indicated.  Reassess for response to pain medications and document.  Pt on Scheduled tylenol/ketorolac and PRN oxy with rescue IV dilaudid.

## 2016-03-30 NOTE — Anesthesia Postprocedure Evaluation (Signed)
Anesthesia Transfer of Care Note    Patient: Malisia Cowley    Procedures performed: Procedure(s):  LEFT URETEROSCOPY     Vital signs: stable           Anesthesia Post Note    Patient: Delmer Islam    Procedure(s) Performed: Procedure(s):  LEFT URETEROSCOPY       Final anesthesia type: General    Patient location: PACU    Post anesthesia pain: adequate analgesia    Mental status: awake, alert  and oriented    Airway Patent: Yes    Last Vitals:   Vitals:    03/30/16 1542   BP: 147/87   Pulse: 70   Resp: 20   Temp: 36.6 C   SpO2: 96%       Post vital signs: stable    Hydration: adequate    N/V:no    Anesthetic complications: no    Were two or more anti-emetics used?: No, did not administer two or more anti-emetics    Disposal of controlled substances: All controlled substances during the case accounted for and disposed of per hospital policy    Plan of care per primary team.

## 2016-03-31 ENCOUNTER — Telehealth (HOSPITAL_BASED_OUTPATIENT_CLINIC_OR_DEPARTMENT_OTHER): Payer: Self-pay

## 2016-03-31 LAB — MRSA SURVEILLANCE CULTURE

## 2016-03-31 NOTE — Telephone Encounter (Signed)
Transitional Telephonic Nurse  (TTN) Post-Discharge Follow-Up Phone Call   Problems Identified:  Discuss with nurse.    Patient is s/p left ureteroscopy secondary to nephrolithiasis.   Spoke with patient who reports that she was pain free when she left the hospital but now has flank pain again. Rates pain as 4-5/10 on the numeric pain scale. Stated that she just took Oxycodone. Patient has some billing questions. She presented to the ED on 12/7 and discharged 12/8 and status was Observation. She came back 03/28/16 and discharged 03/30/16 and her understanding is that it was an outpatient procedure. Anyway, concerned about billing because of high out-of-pocket share of cost. TTN instructed patient to call Urology clinic to clarify, contact number provider [phone]. Reviewed After Visit Summary with patient including medications, diet, activity, when to call MD/clinic, and when to go to the ED. Instructed to call TTN if needing further assistance.    Rodena Medin Latonya Knight, RN, MSN, CNS, CPAN  Transitional Telephonic Nurse Consultant  [phone]  [email or web address]

## 2016-05-22 ENCOUNTER — Encounter (INDEPENDENT_AMBULATORY_CARE_PROVIDER_SITE_OTHER): Payer: BC Managed Care – PPO | Admitting: Urology

## 2016-05-22 ENCOUNTER — Encounter (INDEPENDENT_AMBULATORY_CARE_PROVIDER_SITE_OTHER): Payer: Self-pay

## 2016-06-11 ENCOUNTER — Telehealth (HOSPITAL_BASED_OUTPATIENT_CLINIC_OR_DEPARTMENT_OTHER): Payer: Self-pay | Admitting: Urology

## 2016-06-11 NOTE — Telephone Encounter (Signed)
Called pt at [phone] to advise of need to reschedule 3/9 appt with Dr. Reather Converse. Offered pt [facility] clinic on 3/14 but pt cannot rescheduled to 3/23 @ 445p and given new clinic location.     No further action required. Closing encounter.

## 2016-06-26 ENCOUNTER — Encounter (HOSPITAL_BASED_OUTPATIENT_CLINIC_OR_DEPARTMENT_OTHER): Payer: BC Managed Care – PPO | Admitting: Urology

## 2016-07-08 ENCOUNTER — Other Ambulatory Visit: Payer: BC Managed Care – PPO | Attending: Surgical

## 2016-07-08 ENCOUNTER — Encounter (HOSPITAL_BASED_OUTPATIENT_CLINIC_OR_DEPARTMENT_OTHER): Payer: Self-pay | Admitting: Surgical

## 2016-07-08 ENCOUNTER — Ambulatory Visit
Admit: 2016-07-08 | Discharge: 2016-07-08 | Disposition: A | Discharge status: Home / Self Care | Payer: BC Managed Care – PPO | Attending: Surgical | Admitting: Surgical

## 2016-07-08 ENCOUNTER — Telehealth (HOSPITAL_BASED_OUTPATIENT_CLINIC_OR_DEPARTMENT_OTHER): Payer: Self-pay | Admitting: Urology

## 2016-07-08 DIAGNOSIS — R109 Unspecified abdominal pain: Principal | ICD-10-CM | POA: Insufficient documentation

## 2016-07-08 DIAGNOSIS — R300 Dysuria: Secondary | ICD-10-CM

## 2016-07-08 DIAGNOSIS — N200 Calculus of kidney: Secondary | ICD-10-CM

## 2016-07-08 DIAGNOSIS — N10 Acute tubulo-interstitial nephritis: Principal | ICD-10-CM

## 2016-07-08 LAB — URINALYSIS
Bilirubin: NEGATIVE
Glucose: NEGATIVE
Ketones: NEGATIVE
Nitrite: POSITIVE — AB
RBC: >50 — AB (ref 0–?)
Specific Gravity: 1.026 (ref 1.002–1.030)
Urobilinogen: NEGATIVE
WBC: >50 — AB (ref 0–?)
pH: 6 (ref 5.0–8.0)

## 2016-07-08 MED ORDER — PHENAZOPYRIDINE HCL 200 MG OR TABS
200.0000 mg | ORAL_TABLET | Freq: Three times a day (TID) | ORAL | 1 refills | Status: DC | PRN
Start: 2016-07-08 — End: 2016-07-19

## 2016-07-08 NOTE — Telephone Encounter (Signed)
Pt is calling to inform provider they're experiencing symptoms    Last Office Visit: 03/30/16    Next Office Visit: 3/23    What are the symptoms: back pain x 1 week, bright red blood with cloths started 3/20, abdominal cramping    Pain level 0-10: about a 7     When did the symptoms start: last week    Where is the pain located:  Abdominal area    Is there any bleeding: Yes    Best Call Back #  [phone]    Best Call back time:  any    Is it OK to leave a voicemail: ok    Was pt informed of turnaround time: yes

## 2016-07-08 NOTE — Telephone Encounter (Signed)
Pt calling to inform the tests that were ordered today were completed.     Please review results in epic and confirm with pt for any follow up.     Pt may be reached at [phone], thank you.

## 2016-07-08 NOTE — Telephone Encounter (Signed)
Called pt. 2 id verified.  Pt stated that 2 weeks ago she started getting back pain as if she was getting a kidney stone. The pain started off in her back and then went around to her abdomen. Pt stated that on 07/07/16 she started peeing bright red blood and clots. Pt has a low grade fever.     Spoke to [place] PA about pt symptoms. She ordered an [place] and UA with culture. Informed pt of the orders and asked if she didn't mind coming down to [facility] to do her imaging and labs so that we could give her ABX sooner if needed. Pt stated she would come down here even though she wasn't feeling well. Gave pt radiology number so she could get seen and get a time frame. Pt to call back if she has any issues scheduling and will follow up Friday with Dr. Reather Converse

## 2016-07-09 LAB — 24 HOUR URINE LITHOLINK TEST
CA 24/KG, 24 Hour Urine - External: 2.3 mg/kg/day (ref 0–4.00)
CA 24/KG, 24 Hour Urine - External: 3.4 mg/kg/day (ref 0–4.00)
CR 24/kg, 24 Hour Urine - External: 14.2 mg/kg/day — AB (ref 15–20)
CR 24/kg, 24 Hour Urine - External: 18.3 mg/kg/day (ref 15–20)
Ca 24/Cr 24, 24 Hour Urine - External: 160 mg/g — AB (ref 0–140)
Ca 24/Cr 24, 24 Hour Urine - External: 183 mg/g — AB (ref 0–140)
Calcium, 24 Hour Urine - External: 175 mg/d (ref 0–200)
Calcium, 24 Hour Urine - External: 236 mg/d — AB (ref 0–200)
Chloride, 24 Hour Urine - External: 126 mmol/day (ref 70–250)
Chloride, 24 Hour Urine - External: 205 mmol/day (ref 70–250)
Citrate, 24 Hour Urine - External: 456 mg/d — AB (ref 550–?)
Citrate, 24 Hour Urine - External: 753 mg/d (ref 550–?)
Creatinine, 24 Hour Urine - External: 1093 mg/d
Creatinine, 24 Hour Urine - External: 1289 mg/d
Magnesium, 24 Hour Urine - External: 110 mg/d (ref 30–120)
Magnesium, 24 Hour Urine - External: 85 mg/d (ref 30–120)
NH4, 24 Hour Urine - External: 29 mmol/day (ref 15–60)
NH4, 24 Hour Urine - External: 30 mmol/day (ref 15–60)
Oxalate, 24 Hour Urine - External: 32 mg/d (ref 20–40)
Oxalate, 24 Hour Urine - External: 61 mg/d — AB (ref 20–40)
PCR, 24 Hour Urine- External: 0.7 g/kg/day — AB (ref 0.8–1.4)
PCR, 24 Hour Urine- External: 1.1 g/kg/day (ref 0.8–1.4)
Phosphorus, 24 Hour Urine - External: 0.7 g/day (ref 0.6–1.2)
Phosphorus, 24 Hour Urine - External: 0.8 g/day (ref 0.6–1.2)
Potassium, 24 Hur Urine - External: 46 mmol/day (ref 20–100)
Potassium, 24 Hur Urine - External: 74 mmol/day (ref 20–100)
SS Ca Ox, 24 Hour Urine - External: 12.5 — AB (ref 6–10)
SS Ca Ox, 24 Hour Urine - External: 14.7 — AB (ref 6–10)
SS CaP, 24 Hour Urine - External: 0.6 (ref 0.5–2)
SS CaP, 24 Hour Urine - External: 1.2 (ref 0.5–2)
SS UA, 24 Hour Urine - External: 1 (ref 0–1)
SS UA, 24 Hour Urine - External: 2 — AB (ref 0–1)
Sodium, 24 Hour Urine - External: 126 mmol/day (ref 50–150)
Sodium, 24 Hour Urine - External: 168 mmol/day — AB (ref 50–150)
Sulfate, 24 Hour Urine - External: 23 mEq/day (ref 20–80)
Sulfate, 24 Hour Urine - External: 38 mEq/day (ref 20–80)
Urea Nitrogen, 24 Hour Urine - External: 10.3 g/day (ref 6–14)
Urea Nitrogen, 24 Hour Urine - External: 6.7 g/day (ref 6–14)
Uric Acid, 24 Hour Urine - External: 0.473 g/day (ref 0–0.750)
Uric Acid, 24 Hour Urine - External: 0.551 g/day (ref 0–0.750)
Volume, 24 Hour Urine - External: 0.9 Liters/Day (ref 0.5–5)
Volume, 24 Hour Urine - External: 1.5 Liters/Day (ref 0.5–5)
pH, 24 Hour Urine - External: 5.6 — AB (ref 5.8–6.2)
pH, 24 Hour Urine - External: 5.7 — AB (ref 5.8–6.2)

## 2016-07-09 MED ORDER — CIPROFLOXACIN HCL 500 MG OR TABS
500.0000 mg | ORAL_TABLET | Freq: Two times a day (BID) | ORAL | 0 refills | Status: DC
Start: 2016-07-09 — End: 2016-07-10

## 2016-07-09 NOTE — Telephone Encounter (Signed)
Routing to Organ to review UA and kidney US

## 2016-07-09 NOTE — Telephone Encounter (Signed)
Called pt. 2 id verified.  Pt stated she still isn't feeling that great. Informed pt that her UA came back positive for nitrated and pyuria and an ABX was sent into her pharmacy. Pt is aware she is to start taking it today and if she is not feeling better by 24 hours of taking the ABX and still having a fever to go to the ER. Pt stated understanding and has an appointment with Dr. Reather Converse tomorrow as well.  Pt will call back if any other issues

## 2016-07-09 NOTE — Telephone Encounter (Signed)
Patient with UA positive for nitrites and pyuria, U/S negative for hydronephrosis.  Started patient on cipro for presumed pyelo, will tailor culture results.  Routed to nursing staff to call patient.  If fevers/chills do not abate within 24 hours of antibiotics or if she is feeling worsening pain or symptoms, advised patient to present to ED.

## 2016-07-10 ENCOUNTER — Encounter (HOSPITAL_BASED_OUTPATIENT_CLINIC_OR_DEPARTMENT_OTHER): Payer: Self-pay | Admitting: Urology

## 2016-07-10 ENCOUNTER — Ambulatory Visit: Payer: BC Managed Care – PPO | Attending: Urology | Admitting: Urology

## 2016-07-10 VITALS — BP 168/96 | HR 83 | Temp 97.8°F | Resp 18 | Ht 64.0 in | Wt 175.4 lb

## 2016-07-10 DIAGNOSIS — N10 Acute tubulo-interstitial nephritis: Principal | ICD-10-CM | POA: Insufficient documentation

## 2016-07-10 LAB — URINE CULTURE

## 2016-07-10 MED ORDER — CIPROFLOXACIN HCL 500 MG OR TABS
500.0000 mg | ORAL_TABLET | Freq: Two times a day (BID) | ORAL | 0 refills | Status: DC
Start: 2016-07-10 — End: 2016-07-19

## 2016-07-10 MED ORDER — OXYBUTYNIN CHLORIDE 5 MG OR TABS
5.0000 mg | ORAL_TABLET | Freq: Three times a day (TID) | ORAL | 1 refills | Status: DC
Start: 2016-07-10 — End: 2016-08-14

## 2016-07-10 MED ORDER — HYDROCODONE-ACETAMINOPHEN 5-325 MG OR TABS
1.0000 | ORAL_TABLET | Freq: Four times a day (QID) | ORAL | 0 refills | Status: DC | PRN
Start: 2016-07-10 — End: 2016-11-20

## 2016-07-10 MED ORDER — PHENAZOPYRIDINE HCL 200 MG OR TABS
200.0000 mg | ORAL_TABLET | Freq: Three times a day (TID) | ORAL | 0 refills | Status: DC
Start: 2016-07-10 — End: 2016-07-19

## 2016-07-10 NOTE — Progress Notes (Signed)
UROLOGY POST OP STONE NOTE     Assessment/Plan:   Right pyelonephritis  Nephrolithiasis due to Low Volume urine,  Hyperoxaluria    I addressed urolithiasis with the patient today.  I have personally reviewed the images, radiology report, labs, and old records. I do not see any hydronephrosis but do see very small bilateral stones that are likely submucosal stones since I cleared her out during last URS in December.      Add probiotic for recalcitrant Hyperoxaluria. Fu w dietician for hyperoxaluria and also fu w me next week for her ESBL e coli acute pyelonephritis.  Extend her cipro 14 days and add ditropan and pyridium. Plan for 24 hour urine collection, dietary changes and therapeutic antibiotics.    HPI  Melanie Walsh is a 56 year old female who underwent cystourethroscopy with right ureteral stent placement, right ureteroscopy with laser lithotripsy, cystourethroscopy with left ureteral stent placement and left ureteroscopy with laser lithotripsy on 03/30/2016.     Over this past week she has been having severe right flank pain, gross hematuria, dysuria, fevers T=101, nausea.    S/p Bilateral URS/litho 03/2016    During the immediate post-op period (within 3 days after surgery), the patient had renal US performed. Residual stone burden was up to 98mm of residual stone.  A second stage left ureteroscopy was performed performed. Postoperatively, the patient had had no drainage tubes.    Melanie Walsh experienced grade 3B (requiring intervention under GA).  Her postoperative recovery included readmission for repeat URS to r/o left ureteral fragment.  She was discharged on same day as surgery    Since the surgery, Melanie Walsh has had pyelonephritis and has undergone no procedures for stones.    Allergies  Allergies   Allergen Reactions    Adhesive Tape Rash    Hydrocodone-Acetaminophen Rash     Vomiting, nightmares    Latex Rash    Morphine Shortness of Breath and Swelling    Nickel Rash     Swelling and rash    Sulfa Drugs Rash      Related topical skin cream    Erythromycin Nausea Only       Medications    Current Outpatient Prescriptions:     amLODIPINE (NORVASC) 5 MG tablet, Take 5 mg by mouth daily., Disp: , Rfl:     Cholecalciferol (VITAMIN D PO), , Disp: , Rfl:     ciprofloxacin (CIPRO) 500 MG tablet, Take 1 tablet (500 mg) by mouth 2 times daily for 7 days., Disp: 14 tablet, Rfl: 0    docusate sodium (COLACE) 250 MG capsule, Take 1 capsule (250 mg) by mouth 2 times daily as needed for Constipation., Disp: 30 capsule, Rfl: 0    docusate sodium (COLACE) 250 MG capsule, Take 1 capsule (250 mg) by mouth 2 times daily as needed for Constipation., Disp: 40 capsule, Rfl: 0    fluticasone-salmeterol (ADVAIR) 100-50 MCG/DOSE inhaler, Inhale 1 puff by mouth every 12 hours., Disp: , Rfl:     hydrochlorothiazide (HYDRODIURIL) 50 MG tablet, Take 50 mg by mouth daily., Disp: , Rfl:     levalbuterol (XOPENEX) 45 MCG/ACT inhaler, Inhale 2 puffs by mouth every 6 hours as needed for Wheezing., Disp: , Rfl:     LEVOTHYROXINE SODIUM PO, , Disp: , Rfl:     METFORMIN HCL PO, Take 200 mg by mouth daily., Disp: , Rfl:     Multiple Vitamins-Minerals (ONE DAILY CALCIUM/IRON PO), , Disp: , Rfl:     oxyCODONE (ROXICODONE) 5  MG immediate release tablet, Take 1 tablet (5 mg) by mouth every 4 hours as needed for Moderate Pain (Pain Score 4-6)., Disp: 15 tablet, Rfl: 0    phenazopyridine (PYRIDIUM) 100 MG tablet, Take 2 tablets (200 mg) by mouth 3 times daily as needed (Bladder pain or dysuria)., Disp: 21 tablet, Rfl: 0    phenazopyridine (PYRIDIUM) 200 MG tablet, Take 1 tablet (200 mg) by mouth 3 times daily as needed for Mild Pain (Pain Score 1-3) (dysuria)., Disp: 21 tablet, Rfl: 1    tamsulosin (FLOMAX) 0.4 MG capsule, Take 1 capsule (0.4 mg) by mouth daily (with food)., Disp: 30 capsule, Rfl: 0    traMADol (ULTRAM) 50 MG tablet, Take 1 tablet (50 mg) by mouth every 6 hours as needed for Moderate Pain (Pain Score 4-6)., Disp: 15 tablet, Rfl:  0    Past Medical History  Past Medical History:   Diagnosis Date    Asthma     HTN (hypertension)     Kidney disease, medullary sponge     Trigeminal neuralgia      Patient Active Problem List   Diagnosis    Nephrolithiasis     Past Surgical History  Past Surgical History:   Procedure Laterality Date    ANTERIOR CRUCIATE LIGAMENT REPAIR      appendix      HYSTERECTOMY      OSTEOTOMY      SHOULDER SURGERY       Family History  Family History   Problem Relation Age of Onset    Cancer Father     Heart Disease Father     Hypertension Father     Cancer Maternal Grandfather     Cancer Paternal Grandmother     Stroke Paternal Grandmother     Cancer Paternal Grandfather     Diabetes Paternal Grandfather      Social History  Social History     Social History    Marital status: Married     Spouse name: N/A    Number of children: N/A    Years of education: N/A     Social History Main Topics    Smoking status: Never Smoker    Smokeless tobacco: Never Used    Alcohol use Yes      Comment: rare    Drug use: No      Comment: denies    Sexual activity: Not Asked     Social Activities of Daily Living Present    None     Social History Narrative       ROS See pertinent positives and negatives in HPI. All other systems reviewed and found negative.     Objective  Physical Exam  Vitals:    07/10/16 1601   BP: (!) 168/96   BP Location: Left arm   BP Patient Position: Sitting   BP cuff size: Large   Pulse: 83   Resp: 18   Temp: 97.8 F (36.6 C)   TempSrc: Oral   SpO2: 100%   Weight: 79.6 kg (175 lb 6.4 oz)   Height: 5\' 4"  (1.626 m)     GENERAL: The patient is an alert, cooperative female in moderate discomfort  HEENT: normalocephalic/atraumatic  NECK: midline trachea. No jugular venous distention  PULM: no evidence of labored breathing  GI: soft, TTP R>L  BACK: Right CVAT  EXTREMITIES: Joints are normal without redness or swelling.   SKIN: There is no edema or cyanosis.   NEURO: no motor or sensory  deficits.   Alert and Oriented x 3    Lab Review: The following lab results were personally reviewed by me:  BMP Results Latest Ref Rng & Units 03/29/2016 03/28/2016 03/27/2016   GLU 70 - 99 mg/dL 106(H) 117(H) 102(H)   BUN 6 - 20 mg/dL 16 19 23(H)   CREAT 0.51 - 0.95 mg/dL 0.61 0.91 0.90   GFRNON mL/min >60 >60 >60   NA 136 - 145 mmol/L 142 143 141   K 3.5 - 5.1 mmol/L 4.1 4.8 4.5   CL 98 - 107 mmol/L 105 106 104   BICARB 22 - 29 mmol/L 27 26 26    ANION 7 - 15 mmol/L 10 11 11    [place] 8.5 - 10.6 mg/dL 8.8 9.6 9.0     CBC Results Latest Ref Rng & Units 03/28/2016 03/27/2016   WBC 4.0 - 10.0 1000/mm3 7.9 8.1   RBC 3.90 - 5.20 mill/mm3 4.08 4.05   HGB 11.2 - 15.7 gm/dL 13.5 13.0   HCT 34.0 - 45.0 % 41.3 40.8   PLT 140 - 370 1000/mm3 223 219   No flowsheet data found.    24-hour urine collection - this was personally reviewed and results were discussed with the patient. It demonstrates the following:    [facility] Results Latest Ref Rng & Units 05/12/2016 11/20/2014   VOL 0.5 - 5 Liters/Day 1.5 0.9   SS CAOX 6 - 10 12.5(A) 14.7(A)   [place] 0 - 200 mg/day 175 236(A)   OXALATE 20 - 40 mg/day 61.0(A) 32.0   CITRATE 550 mg/day 753 456(A)   SS CaP 0.5 - 2 0.6 1.2   pH 5.8 - 6.2 5.7(A) 5.6(A)   SS URIC 0 - 1 1 2(A)   URIC ACID CONC 0 - 0.750 g/day 0.473 0.551   NA 50 - 150 mmol/day 168(A) 126   K 20 - 100 mmol/day 74 46   MG 30 - 120 mg/day 110 85   PHOS 0.6 - 1.2 g/day 0.7 0.8   NH4 15 - 60 mmol/day 29 30   CHOL 70 - 250 mmol/day 205 126   SULF 20 - 80 mEq/day 23 38   UREA 6 - 14 g/day 6.7 10.3   PCR 0.8 - 1.4 g/kg/day 0.7(A) 1.1   CREAT mg/day 1093.0 1289.0   CR/KG 15 - 20 mg/kg/day 14.2(A) 18.3   [place]/KG 0 - 4.00 mg/kg/day 2.30 3.40   /CR 0 - 140 mg/g 160(A) 183(A)       Imaging  The following imaging was personally reviewed and the findings were discussed with the patient.  No flowsheet data found.    Sherrian Divers, MD

## 2016-07-13 ENCOUNTER — Telehealth (HOSPITAL_BASED_OUTPATIENT_CLINIC_OR_DEPARTMENT_OTHER): Payer: Self-pay | Admitting: Urology

## 2016-07-13 NOTE — Telephone Encounter (Signed)
LVM with new appt date and time. Please confirm with pt. Closing encounter.

## 2016-07-13 NOTE — Telephone Encounter (Signed)
Patient seen Dr. Reather Converse 07/13/16 and was advised to schedule a 1 week follow up.  Pt has appt 07/17/16 @ 4:00PM.  Pt lives in [place] and is asking if she can be squeezed in from 11-1, in order for her to avoid hours of traffic.  If so, patient can be reached at [phone].

## 2016-07-15 LAB — 24 HOUR URINE LITHOLINK TEST
CA 24/KG, 24 Hour Urine - External: 2.3 mg/kg/day (ref 0–4.00)
CA 24/KG, 24 Hour Urine - External: 3.4 mg/kg/day (ref 0–4.00)
CR 24/kg, 24 Hour Urine - External: 14.2 mg/kg/day — AB (ref 15–20)
CR 24/kg, 24 Hour Urine - External: 18.3 mg/kg/day (ref 15–20)
Ca 24/Cr 24, 24 Hour Urine - External: 160 mg/g — AB (ref 0–140)
Ca 24/Cr 24, 24 Hour Urine - External: 183 mg/g — AB (ref 0–140)
Calcium, 24 Hour Urine - External: 175 mg/d (ref 0–200)
Calcium, 24 Hour Urine - External: 236 mg/d — AB (ref 0–200)
Chloride, 24 Hour Urine - External: 126 mmol/day (ref 70–250)
Chloride, 24 Hour Urine - External: 205 mmol/day (ref 70–250)
Citrate, 24 Hour Urine - External: 456 mg/d — AB (ref 550–?)
Citrate, 24 Hour Urine - External: 753 mg/d (ref 550–?)
Creatinine, 24 Hour Urine - External: 1093 mg/d
Creatinine, 24 Hour Urine - External: 1289 mg/d
Magnesium, 24 Hour Urine - External: 110 mg/d (ref 30–120)
Magnesium, 24 Hour Urine - External: 85 mg/d (ref 30–120)
NH4, 24 Hour Urine - External: 29 mmol/day (ref 15–60)
NH4, 24 Hour Urine - External: 30 mmol/day (ref 15–60)
Oxalate, 24 Hour Urine - External: 32 mg/d (ref 20–40)
Oxalate, 24 Hour Urine - External: 61 mg/d — AB (ref 20–40)
PCR, 24 Hour Urine- External: 0.7 g/kg/day — AB (ref 0.8–1.4)
PCR, 24 Hour Urine- External: 1.1 g/kg/day (ref 0.8–1.4)
Phosphorus, 24 Hour Urine - External: 0.7 g/day (ref 0.6–1.2)
Phosphorus, 24 Hour Urine - External: 0.8 g/day (ref 0.6–1.2)
Potassium, 24 Hur Urine - External: 46 mmol/day (ref 20–100)
Potassium, 24 Hur Urine - External: 74 mmol/day (ref 20–100)
SS Ca Ox, 24 Hour Urine - External: 12.5 — AB (ref 6–10)
SS Ca Ox, 24 Hour Urine - External: 14.7 — AB (ref 6–10)
SS CaP, 24 Hour Urine - External: 0.6 (ref 0.5–2)
SS CaP, 24 Hour Urine - External: 1.2 (ref 0.5–2)
SS UA, 24 Hour Urine - External: 1 (ref 0–1)
SS UA, 24 Hour Urine - External: 2 — AB (ref 0–1)
Sodium, 24 Hour Urine - External: 126 mmol/day (ref 50–150)
Sodium, 24 Hour Urine - External: 168 mmol/day — AB (ref 50–150)
Sulfate, 24 Hour Urine - External: 23 mEq/day (ref 20–80)
Sulfate, 24 Hour Urine - External: 38 mEq/day (ref 20–80)
Urea Nitrogen, 24 Hour Urine - External: 10.3 g/day (ref 6–14)
Urea Nitrogen, 24 Hour Urine - External: 6.7 g/day (ref 6–14)
Uric Acid, 24 Hour Urine - External: 0.473 g/day (ref 0–0.750)
Uric Acid, 24 Hour Urine - External: 0.551 g/day (ref 0–0.750)
Volume, 24 Hour Urine - External: 0.9 Liters/Day (ref 0.5–5)
Volume, 24 Hour Urine - External: 1.5 Liters/Day (ref 0.5–5)
pH, 24 Hour Urine - External: 5.6 — AB (ref 5.8–6.2)
pH, 24 Hour Urine - External: 5.7 — AB (ref 5.8–6.2)

## 2016-07-17 ENCOUNTER — Ambulatory Visit: Payer: BC Managed Care – PPO | Attending: Urology | Admitting: Urology

## 2016-07-17 ENCOUNTER — Encounter (HOSPITAL_BASED_OUTPATIENT_CLINIC_OR_DEPARTMENT_OTHER): Payer: Self-pay | Admitting: Physician Assistant

## 2016-07-17 ENCOUNTER — Emergency Department (HOSPITAL_BASED_OUTPATIENT_CLINIC_OR_DEPARTMENT_OTHER): Payer: BC Managed Care – PPO

## 2016-07-17 ENCOUNTER — Encounter (HOSPITAL_BASED_OUTPATIENT_CLINIC_OR_DEPARTMENT_OTHER): Payer: Self-pay | Admitting: Urology

## 2016-07-17 ENCOUNTER — Inpatient Hospital Stay
Admission: EM | Admit: 2016-07-17 | Discharge: 2016-07-19 | DRG: 690 | Disposition: A | Discharge status: Home / Self Care | Payer: BC Managed Care – PPO | Attending: Hospitalist | Admitting: Hospitalist

## 2016-07-17 ENCOUNTER — Ambulatory Visit (HOSPITAL_BASED_OUTPATIENT_CLINIC_OR_DEPARTMENT_OTHER): Payer: BC Managed Care – PPO | Admitting: Urology

## 2016-07-17 VITALS — BP 185/95 | HR 92

## 2016-07-17 DIAGNOSIS — E039 Hypothyroidism, unspecified: Secondary | ICD-10-CM | POA: Diagnosis present

## 2016-07-17 DIAGNOSIS — Z86718 Personal history of other venous thrombosis and embolism: Secondary | ICD-10-CM

## 2016-07-17 DIAGNOSIS — Z881 Allergy status to other antibiotic agents status: Secondary | ICD-10-CM

## 2016-07-17 DIAGNOSIS — Z87442 Personal history of urinary calculi: Secondary | ICD-10-CM

## 2016-07-17 DIAGNOSIS — G4700 Insomnia, unspecified: Secondary | ICD-10-CM | POA: Diagnosis present

## 2016-07-17 DIAGNOSIS — Z809 Family history of malignant neoplasm, unspecified: Secondary | ICD-10-CM

## 2016-07-17 DIAGNOSIS — B9629 Other Escherichia coli [E. coli] as the cause of diseases classified elsewhere: Secondary | ICD-10-CM

## 2016-07-17 DIAGNOSIS — N200 Calculus of kidney: Secondary | ICD-10-CM

## 2016-07-17 DIAGNOSIS — Z90710 Acquired absence of both cervix and uterus: Secondary | ICD-10-CM

## 2016-07-17 DIAGNOSIS — J45909 Unspecified asthma, uncomplicated: Secondary | ICD-10-CM | POA: Diagnosis present

## 2016-07-17 DIAGNOSIS — Z91040 Latex allergy status: Secondary | ICD-10-CM

## 2016-07-17 DIAGNOSIS — Z7984 Long term (current) use of oral hypoglycemic drugs: Secondary | ICD-10-CM

## 2016-07-17 DIAGNOSIS — Z882 Allergy status to sulfonamides status: Secondary | ICD-10-CM

## 2016-07-17 DIAGNOSIS — Z8249 Family history of ischemic heart disease and other diseases of the circulatory system: Secondary | ICD-10-CM

## 2016-07-17 DIAGNOSIS — R197 Diarrhea, unspecified: Secondary | ICD-10-CM | POA: Diagnosis present

## 2016-07-17 DIAGNOSIS — I10 Essential (primary) hypertension: Secondary | ICD-10-CM | POA: Diagnosis present

## 2016-07-17 DIAGNOSIS — N210 Calculus in bladder: Secondary | ICD-10-CM | POA: Diagnosis present

## 2016-07-17 DIAGNOSIS — G500 Trigeminal neuralgia: Secondary | ICD-10-CM | POA: Diagnosis present

## 2016-07-17 DIAGNOSIS — Z833 Family history of diabetes mellitus: Secondary | ICD-10-CM

## 2016-07-17 DIAGNOSIS — Z91048 Other nonmedicinal substance allergy status: Secondary | ICD-10-CM

## 2016-07-17 DIAGNOSIS — A498 Other bacterial infections of unspecified site: Secondary | ICD-10-CM

## 2016-07-17 DIAGNOSIS — Z1612 Extended spectrum beta lactamase (ESBL) resistance: Secondary | ICD-10-CM | POA: Diagnosis present

## 2016-07-17 DIAGNOSIS — N202 Calculus of kidney with calculus of ureter: Secondary | ICD-10-CM

## 2016-07-17 DIAGNOSIS — Z885 Allergy status to narcotic agent status: Secondary | ICD-10-CM

## 2016-07-17 DIAGNOSIS — A499 Bacterial infection, unspecified: Secondary | ICD-10-CM

## 2016-07-17 DIAGNOSIS — Z823 Family history of stroke: Secondary | ICD-10-CM

## 2016-07-17 DIAGNOSIS — Z79899 Other long term (current) drug therapy: Secondary | ICD-10-CM

## 2016-07-17 DIAGNOSIS — Q615 Medullary cystic kidney: Secondary | ICD-10-CM

## 2016-07-17 DIAGNOSIS — N10 Acute tubulo-interstitial nephritis: Principal | ICD-10-CM | POA: Insufficient documentation

## 2016-07-17 DIAGNOSIS — R109 Unspecified abdominal pain: Secondary | ICD-10-CM

## 2016-07-17 DIAGNOSIS — N390 Urinary tract infection, site not specified: Secondary | ICD-10-CM

## 2016-07-17 DIAGNOSIS — N12 Tubulo-interstitial nephritis, not specified as acute or chronic: Principal | ICD-10-CM | POA: Diagnosis present

## 2016-07-17 DIAGNOSIS — B9620 Unspecified Escherichia coli [E. coli] as the cause of diseases classified elsewhere: Secondary | ICD-10-CM | POA: Diagnosis present

## 2016-07-17 LAB — CBC WITH DIFF, BLOOD
ANC-Automated: 3.5 10*3/uL (ref 1.6–7.0)
Abs Basophils: 0.1 10*3/uL (ref <0.1)
Abs Eosinophils: 0.2 10*3/uL (ref 0.1–0.5)
Abs Lymphs: 2.1 10*3/uL (ref 0.8–3.1)
Abs Monos: 0.7 10*3/uL (ref 0.2–0.8)
Basophils: 1 %
Eosinophils: 3 %
Hct: 38.7 % (ref 34.0–45.0)
Hgb: 13.1 gm/dL (ref 11.2–15.7)
Lymphocytes: 32 %
MCH: 33.2 pg — ABNORMAL HIGH (ref 26.0–32.0)
MCHC: 33.9 g/dL (ref 32.0–36.0)
MCV: 98 um3 — ABNORMAL HIGH (ref 79.0–95.0)
MPV: 10.3 fL (ref 9.4–12.4)
Monocytes: 10 %
Plt Count: 237 10*3/uL (ref 140–370)
RBC: 3.95 10*6/uL (ref 3.90–5.20)
RDW: 12.7 % (ref 12.0–14.0)
Segs: 54 %
WBC: 6.5 10*3/uL (ref 4.0–10.0)

## 2016-07-17 LAB — COMPREHENSIVE METABOLIC PANEL, BLOOD
ALT (SGPT): 14 U/L (ref 0–33)
AST (SGOT): 17 U/L (ref 0–32)
Albumin: 4.2 g/dL (ref 3.5–5.2)
Alkaline Phos: 62 U/L (ref 35–140)
Anion Gap: 15 mmol/L (ref 7–15)
BUN: 19 mg/dL (ref 6–20)
Bicarbonate: 25 mmol/L (ref 22–29)
Bilirubin, Tot: 0.27 mg/dL (ref <1.2)
Calcium: 9.1 mg/dL (ref 8.5–10.6)
Chloride: 104 mmol/L (ref 98–107)
Creatinine: 0.69 mg/dL (ref 0.51–0.95)
GFR: >60 mL/min
Glucose: 100 mg/dL — ABNORMAL HIGH (ref 70–99)
Potassium: 4.1 mmol/L (ref 3.5–5.1)
Sodium: 144 mmol/L (ref 136–145)
Total Protein: 6.6 g/dL (ref 6.0–8.0)

## 2016-07-17 LAB — URINALYSIS WITH CULTURE REFLEX, WHEN INDICATED
Bilirubin: NEGATIVE
Blood: NEGATIVE
Glucose: NEGATIVE
Ketones: NEGATIVE
Leuk Esterase: NEGATIVE
Nitrite: POSITIVE — AB
Protein: NEGATIVE
Specific Gravity: 1.023 (ref 1.002–1.030)
pH: 6 (ref 5.0–8.0)

## 2016-07-17 LAB — LACTATE, BLOOD: Lactate: 1.3 mmol/L (ref 0.5–2.0)

## 2016-07-17 LAB — TSH, BLOOD: TSH: 1.28 u[IU]/mL (ref 0.27–4.20)

## 2016-07-17 MED ORDER — IOHEXOL 350 MG/ML IV SOLN
100.0000 mL | Freq: Once | INTRAVENOUS | Status: AC
Start: 2016-07-17 — End: 2016-07-17
  Administered 2016-07-17: 100 mL via INTRAVENOUS
  Filled 2016-07-17: qty 100

## 2016-07-17 MED ORDER — HYDROCODONE-ACETAMINOPHEN 5-325 MG OR TABS
1.0000 | ORAL_TABLET | Freq: Four times a day (QID) | ORAL | Status: DC | PRN
Start: 2016-07-17 — End: 2016-07-17
  Administered 2016-07-17: 1 via ORAL
  Filled 2016-07-17: qty 1

## 2016-07-17 MED ORDER — SODIUM CHLORIDE 0.9 % IJ SOLN (CUSTOM)
3.0000 mL | Freq: Three times a day (TID) | INTRAMUSCULAR | Status: DC
Start: 2016-07-17 — End: 2016-07-19
  Administered 2016-07-17 – 2016-07-19 (×3): 3 mL via INTRAVENOUS

## 2016-07-17 MED ORDER — ALUM & MAG HYDROXIDE-SIMETH 200-200-20 MG/5ML OR SUSP
30.0000 mL | Freq: Four times a day (QID) | ORAL | Status: DC | PRN
Start: 2016-07-17 — End: 2016-07-19

## 2016-07-17 MED ORDER — OXYCODONE HCL 5 MG OR TABS
5.0000 mg | ORAL_TABLET | ORAL | Status: DC | PRN
Start: 2016-07-17 — End: 2016-07-19
  Filled 2016-07-17: qty 1

## 2016-07-17 MED ORDER — SODIUM CHLORIDE 0.9% TKO INFUSION
INTRAVENOUS | Status: DC | PRN
Start: 2016-07-17 — End: 2016-07-19

## 2016-07-17 MED ORDER — ONDANSETRON HCL 4 MG/2ML IV SOLN
4.0000 mg | Freq: Four times a day (QID) | INTRAMUSCULAR | Status: DC | PRN
Start: 2016-07-17 — End: 2016-07-19
  Administered 2016-07-19: 4 mg via INTRAVENOUS
  Filled 2016-07-17: qty 2

## 2016-07-17 MED ORDER — SODIUM CHLORIDE 0.9 % IV SOLN
1000.0000 mg | Freq: Once | INTRAVENOUS | Status: AC
Start: 2016-07-17 — End: 2016-07-17
  Administered 2016-07-17: 1000 mg via INTRAVENOUS
  Filled 2016-07-17: qty 1000

## 2016-07-17 MED ORDER — ACETAMINOPHEN 160 MG/5ML OR SOLN
650.0000 mg | ORAL | Status: DC | PRN
Start: 2016-07-17 — End: 2016-07-19

## 2016-07-17 MED ORDER — CARBAMAZEPINE 200 MG OR TABS
100.00 mg | ORAL_TABLET | Freq: Three times a day (TID) | ORAL | Status: DC
Start: ? — End: 2016-07-19

## 2016-07-17 MED ORDER — ALBUTEROL SULFATE 108 (90 BASE) MCG/ACT IN AERS
2.0000 | INHALATION_SPRAY | Freq: Four times a day (QID) | RESPIRATORY_TRACT | Status: DC | PRN
Start: 2016-07-17 — End: 2016-07-19

## 2016-07-17 MED ORDER — ONDANSETRON HCL 4 MG/2ML IV SOLN
4.0000 mg | Freq: Four times a day (QID) | INTRAMUSCULAR | Status: DC | PRN
Start: 2016-07-17 — End: 2016-07-17

## 2016-07-17 MED ORDER — OXYCODONE-ACETAMINOPHEN 5-325 MG OR TABS
1.0000 | ORAL_TABLET | Freq: Once | ORAL | Status: AC
Start: 2016-07-17 — End: 2016-07-17
  Administered 2016-07-17: 1 via ORAL
  Filled 2016-07-17: qty 1

## 2016-07-17 MED ORDER — SENNA 8.6 MG OR TABS
2.0000 | ORAL_TABLET | Freq: Every morning | ORAL | Status: DC
Start: 2016-07-18 — End: 2016-07-19
  Administered 2016-07-19: 17.2 mg via ORAL
  Filled 2016-07-17 (×2): qty 2

## 2016-07-17 MED ORDER — SODIUM CHLORIDE 0.9 % IV SOLN
Freq: Once | INTRAVENOUS | Status: AC
Start: 2016-07-17 — End: 2016-07-17
  Administered 2016-07-17: 13:00:00 via INTRAVENOUS

## 2016-07-17 MED ORDER — SODIUM CHLORIDE 0.9 % IV SOLN
1000.0000 mg | INTRAVENOUS | Status: DC
Start: 2016-07-18 — End: 2016-07-19
  Administered 2016-07-18 – 2016-07-19 (×2): 1000 mg via INTRAVENOUS
  Filled 2016-07-17 (×2): qty 1000

## 2016-07-17 MED ORDER — NALOXONE HCL 0.4 MG/ML IJ SOLN
0.1000 mg | INTRAMUSCULAR | Status: DC | PRN
Start: 2016-07-17 — End: 2016-07-19

## 2016-07-17 MED ORDER — AMLODIPINE 5 MG OR TABS
5.0000 mg | ORAL_TABLET | Freq: Every day | ORAL | Status: DC
Start: 2016-07-18 — End: 2016-07-18
  Filled 2016-07-17: qty 1

## 2016-07-17 MED ORDER — CARBAMAZEPINE 200 MG OR TABS
100.0000 mg | ORAL_TABLET | Freq: Three times a day (TID) | ORAL | Status: DC
Start: 2016-07-17 — End: 2016-07-18
  Administered 2016-07-17: 100 mg via ORAL
  Filled 2016-07-17 (×2): qty 1

## 2016-07-17 MED ORDER — HYDROCHLOROTHIAZIDE 25 MG OR TABS
50.0000 mg | ORAL_TABLET | Freq: Every day | ORAL | Status: DC
Start: 2016-07-18 — End: 2016-07-19
  Administered 2016-07-18 – 2016-07-19 (×2): 50 mg via ORAL
  Filled 2016-07-17 (×2): qty 2

## 2016-07-17 MED ORDER — ACETAMINOPHEN 325 MG PO TABS
650.0000 mg | ORAL_TABLET | ORAL | Status: DC | PRN
Start: 2016-07-17 — End: 2016-07-19
  Filled 2016-07-17: qty 2

## 2016-07-17 MED ORDER — ACETAMINOPHEN 325 MG PO TABS
650.0000 mg | ORAL_TABLET | ORAL | Status: DC | PRN
Start: 2016-07-17 — End: 2016-07-19
  Administered 2016-07-19: 650 mg via ORAL

## 2016-07-17 MED ORDER — DOCUSATE SODIUM 250 MG OR CAPS
250.0000 mg | ORAL_CAPSULE | Freq: Every evening | ORAL | Status: DC
Start: 2016-07-17 — End: 2016-07-19
  Administered 2016-07-18: 250 mg via ORAL
  Filled 2016-07-17 (×2): qty 1

## 2016-07-17 MED ORDER — ACETAMINOPHEN 650 MG RE SUPP
650.0000 mg | RECTAL | Status: DC | PRN
Start: 2016-07-17 — End: 2016-07-19

## 2016-07-17 MED ORDER — ZOLPIDEM TARTRATE 5 MG OR TABS
5.0000 mg | ORAL_TABLET | Freq: Every evening | ORAL | Status: DC | PRN
Start: 2016-07-17 — End: 2016-07-19
  Administered 2016-07-17: 5 mg via ORAL
  Filled 2016-07-17 (×2): qty 1

## 2016-07-17 MED ORDER — SODIUM CHLORIDE 0.9 % IJ SOLN (CUSTOM)
3.0000 mL | INTRAMUSCULAR | Status: DC | PRN
Start: 2016-07-17 — End: 2016-07-19

## 2016-07-17 MED ORDER — ONDANSETRON HCL 4 MG/2ML IV SOLN
4.0000 mg | Freq: Once | INTRAMUSCULAR | Status: AC
Start: 2016-07-17 — End: 2016-07-17
  Administered 2016-07-17: 4 mg via INTRAVENOUS
  Filled 2016-07-17: qty 2

## 2016-07-17 MED ORDER — OXYCODONE HCL 10 MG OR TABS
10.0000 mg | ORAL_TABLET | ORAL | Status: DC | PRN
Start: 2016-07-17 — End: 2016-07-19
  Administered 2016-07-17: 10 mg via ORAL
  Filled 2016-07-17 (×2): qty 1

## 2016-07-17 MED ORDER — LEVOTHYROXINE SODIUM 50 MCG OR TABS
50.0000 ug | ORAL_TABLET | Freq: Every day | ORAL | Status: DC
Start: 2016-07-18 — End: 2016-07-19
  Administered 2016-07-18 – 2016-07-19 (×2): 50 ug via ORAL
  Filled 2016-07-17 (×2): qty 1

## 2016-07-17 MED ORDER — TRAMADOL HCL 50 MG OR TABS
50.0000 mg | ORAL_TABLET | Freq: Four times a day (QID) | ORAL | Status: DC | PRN
Start: 2016-07-17 — End: 2016-07-17

## 2016-07-17 MED ORDER — OXYCODONE HCL 5 MG OR TABS
5.0000 mg | ORAL_TABLET | ORAL | Status: DC | PRN
Start: 2016-07-17 — End: 2016-07-19
  Administered 2016-07-18 – 2016-07-19 (×3): 5 mg via ORAL
  Filled 2016-07-17 (×3): qty 1

## 2016-07-17 NOTE — ED Notes (Addendum)
20g IV started in R AC, 1st attempt and without any difficulty. Blood and blood culture#1 obtained from site, labeled, and sent to lab. IV saline locked after flushed with NS 10ml.

## 2016-07-17 NOTE — H&P (Signed)
Hospital Medicine Admission    Attending MD:   Frederich Chick, *    Chief Complaint:  Dysuria, bilateral flank pain, and fever    History of Present Illness:     Melanie Walsh is a 56 year old female with a history of medullary sponge kidney disease (dx 1996),  nephrolothiasis (s/p right and left cystourethroscopy and laser lithotripsy on 03/30/2016), hypothyroidism, HTN, and distant hx of DVT (11 years ago) who was referred to ED by urologist after clinic visit for dysuria, flank pain, and  fevers. Pt had a positive urine culture from 3/21 with ESBL Ecoli, started on Cipro 10 days ago, but continues to have burning with urination with r/l sided flank back pain. Pt states that flank pain has subsided since being admitted into the ED, now endorses RUQ and bladder pain.  Pt states that she is not diabetic, only takes metformin to lose weight.  Pt denies chills, night sweats, lightheadedness, dizziness, sore throat, cough, chest pain, heart palpitations, SOB, difficulty breathing, weakness, and fatigue.     ED Course:   Vitals: Afebrile, VSS  Labs: lactate wnl = 1.3, WBC wnl = 6.5, UA with Pos nitrite, 3-5 WBC, rare bacteria  Meds: Zofran 15mm Ertapenem 1,000 mg, Percocet, NS 1L bolus    Past Medical History:  Past Medical History:   Diagnosis Date    Asthma     HTN (hypertension)     Kidney disease, medullary sponge     Trigeminal neuralgia        Past Surgical History:  Past Surgical History:   Procedure Laterality Date    ANTERIOR CRUCIATE LIGAMENT REPAIR      appendix      HYSTERECTOMY      OSTEOTOMY      SHOULDER SURGERY         Allergies:  Allergies   Allergen Reactions    Adhesive Tape Rash    Hydrocodone-Acetaminophen Rash     Vomiting, nightmares    Latex Rash    Morphine Shortness of Breath and Swelling    Nickel Rash     Swelling and rash    Sulfa Drugs Rash     Related topical skin cream    Erythromycin Nausea Only       Medications:  No current facility-administered medications on  file prior to encounter.      Current Outpatient Prescriptions on File Prior to Encounter   Medication Sig    amLODIPINE (NORVASC) 5 MG tablet Take 5 mg by mouth daily.    Cholecalciferol (VITAMIN D PO)     ciprofloxacin (CIPRO) 500 MG tablet Take 1 tablet (500 mg) by mouth 2 times daily for 14 days.    [DISCONTINUED] docusate sodium (COLACE) 250 MG capsule Take 1 capsule (250 mg) by mouth 2 times daily as needed for Constipation.    [DISCONTINUED] docusate sodium (COLACE) 250 MG capsule Take 1 capsule (250 mg) by mouth 2 times daily as needed for Constipation.    fluticasone-salmeterol (ADVAIR) 100-50 MCG/DOSE inhaler Inhale 1 puff by mouth every 12 hours.    hydrochlorothiazide (HYDRODIURIL) 50 MG tablet Take 50 mg by mouth daily.    HYDROcodone-acetaminophen (NORCO) 5-325 MG tablet Take 1 tablet by mouth every 6 hours as needed for Moderate Pain (Pain Score 4-6).    levalbuterol (XOPENEX) 45 MCG/ACT inhaler Inhale 2 puffs by mouth every 6 hours as needed for Wheezing.    LEVOTHYROXINE SODIUM PO     METFORMIN HCL PO Take 200  mg by mouth daily.    Multiple Vitamins-Minerals (ONE DAILY CALCIUM/IRON PO)     oxybutynin (DITROPAN) 5 MG tablet Take 1 tablet (5 mg) by mouth 3 times daily. Start taking after surgery for urinary symptoms    [DISCONTINUED] oxyCODONE (ROXICODONE) 5 MG immediate release tablet Take 1 tablet (5 mg) by mouth every 4 hours as needed for Moderate Pain (Pain Score 4-6).    phenazopyridine (PYRIDIUM) 100 MG tablet Take 2 tablets (200 mg) by mouth 3 times daily as needed (Bladder pain or dysuria).    phenazopyridine (PYRIDIUM) 200 MG tablet Take 1 tablet (200 mg) by mouth 3 times daily for 7 days. Start taking after surgery as needed for bladder/urinary pain    phenazopyridine (PYRIDIUM) 200 MG tablet Take 1 tablet (200 mg) by mouth 3 times daily as needed for Mild Pain (Pain Score 1-3) (dysuria).    tamsulosin (FLOMAX) 0.4 MG capsule Take 1 capsule (0.4 mg) by mouth daily (with  food).    traMADol (ULTRAM) 50 MG tablet Take 1 tablet (50 mg) by mouth every 6 hours as needed for Moderate Pain (Pain Score 4-6).         Social History:  Social History     Social History    Marital status: Married     Spouse name: N/A    Number of children: N/A    Years of education: N/A     Social History Main Topics    Smoking status: Never Smoker    Smokeless tobacco: Never Used    Alcohol use Yes      Comment: rare    Drug use: No      Comment: denies    Sexual activity: Not on file     Social Activities of Daily Living Present    Not on file     Social History Narrative       Family History:  Family History   Problem Relation Age of Onset    Cancer Father     Heart Disease Father     Hypertension Father     Cancer Maternal Grandfather     Cancer Paternal Grandmother     Stroke Paternal Grandmother     Cancer Paternal Grandfather     Diabetes Paternal Grandfather      Review of Systems   Constitutional: Positive for fever. Negative for chills and fatigue.   HENT: Negative for sore throat and trouble swallowing.    Respiratory: Negative for cough and wheezing.    Cardiovascular: Negative for chest pain and palpitations.   Gastrointestinal: Positive for abdominal pain and diarrhea. Negative for vomiting.        RUQ abdominal pain   Genitourinary: Positive for dysuria, flank pain and hematuria.   Musculoskeletal: Negative for arthralgias and back pain.   Skin: Negative for rash and wound.   Neurological: Negative for dizziness, weakness and light-headedness.   Psychiatric/Behavioral: Negative for agitation and confusion.     Physical Exam:  BP 158/81 (BP Location: Left arm, BP Patient Position: Sitting)   Pulse 76   Temp 98.6 F (37 C)   Resp 18   Ht '5\' 4"'  (1.626 m)   Wt 81.3 kg (179 lb 3.2 oz)   LMP 03/31/2007 (Approximate)   SpO2 98%   BMI 30.76 kg/m2     Physical Exam   Constitutional: She is oriented to person, place, and time. She appears well-developed and well-nourished. No distress.      HENT:   Head: Normocephalic  and atraumatic.   Eyes: EOM are normal. Pupils are equal, round, and reactive to light.   Neck: Normal range of motion. Neck supple.   Cardiovascular: Normal rate, regular rhythm and normal heart sounds.    Pulmonary/Chest: Effort normal and breath sounds normal.   Abdominal: Soft. Bowel sounds are normal. There is tenderness. There is CVA tenderness.   Mild TTP on RUQ   Right sided CVA tenderness   Neurological: She is alert and oriented to person, place, and time.   Skin: Skin is warm and dry.   Psychiatric: She has a normal mood and affect. Her behavior is normal.       Labs and Other Data:  Lab Results   Component Value Date    K 4.1 07/17/2016    CL 104 07/17/2016    BICARB 25 07/17/2016    BUN 19 07/17/2016    CREAT 0.69 07/17/2016    GLU 100 (H) 07/17/2016    [place] 9.1 07/17/2016     Lab Results   Component Value Date    WBC 6.5 07/17/2016    HGB 13.1 07/17/2016    HCT 38.7 07/17/2016    PLT 237 07/17/2016     Lab Results   Component Value Date    WBC 6.5 07/17/2016    HGB 13.1 07/17/2016    HCT 38.7 07/17/2016    PLT 237 07/17/2016    SEG 54 07/17/2016    LYMPHS 32 07/17/2016    MONOS 10 07/17/2016    EOS 3 07/17/2016     Lab Results   Component Value Date    AST 17 07/17/2016    ALT 14 07/17/2016    ALK 62 07/17/2016    TBILI 0.27 07/17/2016    TP 6.6 07/17/2016    ALB 4.2 07/17/2016     No results found for: INR, PTT  No results found for: ARTPH, ARTPO2, ARTPCO2  No results found for: PH, PO2, PCO2  Lab Results   Component Value Date    TSH 1.28 07/17/2016     No results found for: CPK, CKMBH, TROPONIN  No results found for: CPK, CKMBH, TROPONIN  Lab Results   Component Value Date    PHUA 6.0 07/17/2016    SGUA 1.023 07/17/2016    GLUCOSEUA Negative 07/17/2016    KETONEUA Negative 07/17/2016    BLOODUA Negative 07/17/2016    PROTEINUA Negative 07/17/2016    LEUKESTUA Negative 07/17/2016    NITRITEUA Positive (A) 07/17/2016    WBCUA 3-5 (A) 07/17/2016    RBCUA 0-2 07/17/2016        Micro:   3/30 Blood culture (x2) in progress  3/30 Urine culture    3/21 Urine Culture: ESBL ECOLI  ESBL Escherichia coli         Not Specified      Amikacin <=8 mcg/mL Susceptible 1      Ampicillin >16 mcg/mL Resistant 2      Ampicillin/Sulbactam >16/8 mcg/mL Resistant 2      Cefazolin >16 mcg/mL Resistant 3      Cefepime >16 mcg/mL Resistant 3      Cefoxitin <=4 mcg/mL Resistant 3      Ceftazidime >16 mcg/mL Resistant 3      Ceftriaxone >32 mcg/mL Resistant 3      Ciprofloxacin <=0.5 mcg/mL Susceptible 3      Ertapenem <=0.25 mcg/mL Susceptible 3      Gentamicin <=2 mcg/mL Susceptible 1      Meropenem <=0.5 mcg/mL Susceptible  3      Nitrofurantoin <=16 mcg/mL Susceptible 4      Piperacillin/Tazobactam 4/4 mcg/mL Resistant 2      Tobramycin <=2 mcg/mL Susceptible 1      Trimethoprim/Sulfamethoxazole >2/38 mcg/mL Resistant 2          Imaging:   Ct Urography  Result Date: 07/17/2016  IMPRESSION: CT Urogram Overall, the calculi burden has decreased when compared to the CT on 01/30/2016, with only punctate calculi seen in the right kidney. No hydronephrosis. A punctate 3 mm calculus is seen adjacent to the left ureterovesical junction, which on delayed views is felt to be in the bladder. Symmetric nephrograms with no perinephric stranding or renal abscess.     US Kidney Complete  Result Date: 07/08/2016  IMPRESSION: Multiple tiny nonobstructive stones measuring up to 5 mm within the both kidneys. No hydronephrosis bilaterally. Bilateral ureteral jets noted.       Assessment and Care Plan:  Melanie Walsh is a 56 year old female with a history of medullary sponge kidney disease (dx 1996),  nephrolithiasis (s/p right and left cystourethroscopy and laser lithotripsy on 03/30/2016), hypothyroidism, HTN, and distant hx of DVT (11 years ago) who was referred to ED by urologist after clinic visit for dysuria, flank pain, and  fevers.     # UTI c/f pyelonephritis with right sided CVA tenderness. UA= pos  nitrite, 3-5 WBC, and rare bacteria. Pt has been on Cipro for 10 days as an outpatient and was started on Ertapenem in ED. Previous culture on 3/21 grew ESBL Ecoli. Pt is afebrile, WBC wnl.  - continue antibiotics: ertapenem ( 3/30 - )  - f/u urine culture    # Nephrolithiasis with US kidney showing tiny nonobstructive stones in both kidneys with no hydronephrosis and  CT showing decreased calculi burden compared to CT on 01/30/2016.  Pt being followed by Dr. Reather Converse, Francee Piccolo (urologist).   - notify Dr. Reather Converse of admission  - f/u with urology consult    # Diarrhea. Pt states she's been having watery diarrhea multiple times a day.  - f/u with Cdiff tox    # Medullary Sponge kidney disease, no acute symptoms    # Trigeminal neuralgia. No acute symptoms  - continue carbamazepine, 100 mg, TID PRN    # HTN  - continue amlodipine 28m  - continue hydrochlorothiazide 564m   # Hypothyroidism  - continue levothyroxine 50 mcg    #  Seasonal Asthma. Pt using levalbuterol daily.  - continue levalbuterol PRN for wheezing    # Disposition  - Med/Surg    # FEN  - regular diet    # PPX  - pt is ambulatory    # Code  - full code    EnLarey DaysPA    07/17/16, 4:51 PM      .

## 2016-07-17 NOTE — ED Floor Report (Addendum)
ED to IP Handoff    Report created by Deatra Canter, RN at 2:30 PM 07/17/2016.     HANDOFF REPORT UPDATE/CHANGES (changes in patient status/care/events prior to transfer)  By who:  Time:   Additional information:                                                                                                                                                     Melanie Walsh is a 56 year old female.    Brief Summary of ED Visit (to include focused assessment and neuro status):  56 year old female with sent in by her urologist for ESBL UTI not doing well on Cipro.  She reports persistent nausea and also low-grade fevers.  She felt that she was fevers rather than actually took her temperature.  She has been on Cipro for 10 days but continues to have burning with urination as well as right flank and back pain. She has a history of medullary sponge kidney with multiple kidney stones.  The pain has gotten worse recently however.  She does not have any vomiting but she has had some nausea.      RN shift assessment exceptions to WDL: A&OX3, ambulatory    Any significant events and interventions with responses:  none    Radiologic studies not completed: none  (None unless otherwise noted)    Chief Complaint   Patient presents with    Dysuria     sent here by urologist, burning w urination, right flank/back pain, been on cipro x 10 days. hx medullary sponge kidney disease, mult kidney stones. + low grade fevers, headaches       Admitted for: UTI    Code Status:  Please refer to In-pt admitting doctors orders     Level of Care: Med Surg     Is patient septic? no If yes, complete below:    BC x 2 drawn? yes  If No explain:  na    Repeat lactate needed? yes  If Yes, when is it due?  na    All initial antibiotics given?  yes  If No, explain:  na    Amount of IV fluids received 1050 ml    Is patient on Heparin? no If yes, complete below:     Time Heparin bolus was given: 0    Additional drips patient is on: none    Cardiac  rhythm: na    Oxygen Delivery: None    Past Medical History:   Diagnosis Date    Asthma     HTN (hypertension)     Kidney disease, medullary sponge     Trigeminal neuralgia        Past Surgical History:   Procedure Laterality Date    ANTERIOR CRUCIATE LIGAMENT REPAIR      appendix  HYSTERECTOMY      OSTEOTOMY      SHOULDER SURGERY         Allergies: Adhesive tape; Hydrocodone-acetaminophen; Latex; Morphine; Nickel; Sulfa drugs; and Erythromycin    ED Fall Risk: No    Skin issues:  no    >> If yes, note areas of skin breakdown. See appropriate photos.      Ambulatory:  yes    Sitter needed: no    Suicide Risk:  no    Isolation Required: yes     >> If yes , what type of isolation: ESBL    Is patient in custody?  no    Is patient in restraints? no    Vitals:    07/17/16 1206 07/17/16 1351 07/17/16 1556 07/17/16 1722   BP:  158/94 158/81 158/99   BP Location:   Left arm Left arm   BP Patient Position:   Sitting Sitting   Pulse:  73 76 78   Resp:  16 18 18    Temp:  98.6 F (37 C)  98.4 F (36.9 C)   SpO2: 100% 100% 98% 99%   Weight:       Height:           Blood Cx Set #: 2 (07/17/16 1259 : Deatra Canter, RN)    Lab Results   Component Value Date    WBC 6.5 07/17/2016    RBC 3.95 07/17/2016    HGB 13.1 07/17/2016    HCT 38.7 07/17/2016    MCV 98.0 (H) 07/17/2016    MCHC 33.9 07/17/2016    RDW 12.7 07/17/2016    PLT 237 07/17/2016    MPV 10.3 07/17/2016       Lab Results   Component Value Date    NA 144 07/17/2016    K 4.1 07/17/2016    CL 104 07/17/2016    BICARB 25 07/17/2016    BUN 19 07/17/2016    CREAT 0.69 07/17/2016    GLU 100 (H) 07/17/2016    [place] 9.1 07/17/2016       Lab Results   Component Value Date    LACT[place] 1.3 07/17/2016       No results found for: CPK, CKMBH, TROPONIN    No results found for: PH, PCO2, O2CONTENT, IVHC3, IVBE, O2SAT, UNPH, UNPCO2, ARTPH, ARTPCO2, ARTO2CNT, IAHC3, IABE, ARTO2SAT, UNAPH, UNAPCO2    No results found for this visit on 07/17/16.      Patient Lines/Drains/Airways  Status    Active PICC Line / CVC Line / PIV Line / Drain / Airway / Intraosseous Line / Epidural Line / ART Line / Line Type / Wound     Name: Placement date: Placement time: Site: Days:    Peripheral IV - 20 G Right Antecubital 07/17/16   1216   Antecubital   less than 1                    ED Handoff Report is ready for review.  Admitting RN may reach Emergency Department RN, Deatra Canter, RN, at [phone] with any questions.

## 2016-07-17 NOTE — ED Notes (Signed)
23 g butterfly needle inserted into R hand, 1st attempt and without any difficulty. Blood culture#2 obtained from site, labeled, and sent to lab. Needle withdrawn. Gauze and direct pressure applied to site achieving hemostasis.

## 2016-07-17 NOTE — ED Provider Notes (Signed)
Melanie Walsh  09/01/1960  [id number]  [facility]    Chief Complaint   Patient presents with    Dysuria     sent here by urologist, burning w urination, right flank/back pain, been on cipro x 10 days. hx medullary sponge kidney disease, mult kidney stones. + low grade fevers, headaches        HPI:  56 year old female with sent in by her urologist for ESBL UTI not doing well on Cipro.  She reports persistent nausea and also low-grade fevers.  She felt that she was fevers rather than actually took her temperature.  She has been on Cipro for 10 days but continues to have burning with urination as well as right flank and back pain.  She has a history of medullary sponge kidney with multiple kidney stones.  The pain has gotten worse recently however.  She does not have any vomiting but she has had some nausea.     Past Medical History:   Diagnosis Date    Asthma     HTN (hypertension)     Kidney disease, medullary sponge     Trigeminal neuralgia      Past Surgical History:   Procedure Laterality Date    ANTERIOR CRUCIATE LIGAMENT REPAIR      appendix      HYSTERECTOMY      OSTEOTOMY      SHOULDER SURGERY       Social History   Substance Use Topics    Smoking status: Never Smoker    Smokeless tobacco: Never Used    Alcohol use Yes      Comment: rare     Family History   Problem Relation Age of Onset    Cancer Father     Heart Disease Father     Hypertension Father     Cancer Maternal Grandfather     Cancer Paternal Grandmother     Stroke Paternal Grandmother     Cancer Paternal Grandfather     Diabetes Paternal Grandfather           What To Do With Your Medications      CONTINUE taking these medications       Add'l Info    amLODIPINE 5 MG tablet   Commonly known as:  NORVASC   Take 5 mg by mouth daily.    Refills:  0       ciprofloxacin 500 MG tablet   Commonly known as:  CIPRO   Take 1 tablet (500 mg) by mouth 2 times daily for 14 days.    Quantity:  28 tablet   Refills:  0       fluticasone-salmeterol 100-50 MCG/DOSE inhaler   Commonly known as:  ADVAIR   Inhale 1 puff by mouth every 12 hours.    Refills:  0       hydrochlorothiazide 50 MG tablet   Commonly known as:  HYDRODIURIL   Take 50 mg by mouth daily.    Refills:  0       HYDROcodone-acetaminophen 5-325 MG tablet   Commonly known as:  NORCO   Take 1 tablet by mouth every 6 hours as needed for Moderate Pain (Pain Score 4-6).    Quantity:  40 tablet   Refills:  0       levalbuterol 45 MCG/ACT inhaler   Commonly known as:  XOPENEX   Inhale 2 puffs by mouth every 6 hours as needed for Wheezing.    Refills:  0       LEVOTHYROXINE SODIUM PO    Refills:  0       METFORMIN HCL PO   Take 200 mg by mouth daily.    Refills:  0       ONE DAILY CALCIUM/IRON PO    Refills:  0       oxybutynin 5 MG tablet   Commonly known as:  DITROPAN   Take 1 tablet (5 mg) by mouth 3 times daily. Start taking after surgery for urinary symptoms    Quantity:  21 tablet   Refills:  1       * phenazopyridine 100 MG tablet   Commonly known as:  PYRIDIUM   Take 2 tablets (200 mg) by mouth 3 times daily as needed (Bladder pain or dysuria).    Quantity:  21 tablet   Refills:  0       * phenazopyridine 200 MG tablet   Commonly known as:  PYRIDIUM   Take 1 tablet (200 mg) by mouth 3 times daily as needed for Mild Pain (Pain Score 1-3) (dysuria).    Quantity:  21 tablet   Refills:  1       * phenazopyridine 200 MG tablet   Commonly known as:  PYRIDIUM   Take 1 tablet (200 mg) by mouth 3 times daily for 7 days. Start taking after surgery as needed for bladder/urinary pain    Quantity:  21 tablet   Refills:  0       tamsulosin 0.4 MG capsule   Commonly known as:  FLOMAX   Take 1 capsule (0.4 mg) by mouth daily (with food).    Quantity:  30 capsule   Refills:  0       traMADol 50 MG tablet   Commonly known as:  ULTRAM   Take 1 tablet (50 mg) by mouth every 6 hours as needed for Moderate Pain (Pain Score 4-6).    Quantity:  15 tablet   Refills:  0       VITAMIN D PO    Refills:   0       * Notice:  This list has 3 medication(s) that are the same as other medications prescribed for you. Read the directions carefully, and ask your doctor or other care provider to review them with you.        Allergies   Allergen Reactions    Adhesive Tape Rash    Hydrocodone-Acetaminophen Rash     Vomiting, nightmares    Latex Rash    Morphine Shortness of Breath and Swelling    Nickel Rash     Swelling and rash    Sulfa Drugs Rash     Related topical skin cream    Erythromycin Nausea Only       ROS:    Review of symptoms unless otherwise noted in HPI:  CONST: no fever or chills  HEENT: no sore throat, URI sx  COR: no cp or palpitations  PULM: no sob or cough  GI: no n/v/d  GU: hpi  HEME:   MSK: no joint or bone pain  NEUR: no weakness or tingling  PSYCH: no acute issues     PE:  WNWD female NAD, comfortable-appearing  BP (!) 185/111   Pulse 81   Temp 98.6 F (37 C)   Resp 16   Ht 5\' 4"  (1.626 m)   Wt 81.3 kg (179 lb 3.2 oz)   LMP 03/31/2007 (Approximate)  SpO2 100%   BMI 30.76 kg/m2  HEENT: ncat, perrla, eomi, sclera anicteric,  moist mucus membranes  NECK: supple, no lymphadenopathy or JVD  COR: rrr, normal S1/S2,no rubs/murmurs/gallops  CHEST: clear breath sounds bilaterally, no wheezes, rales or rhonchi  ABDOMEN: soft, nondistended, nontender, mild suprapubic tenderness to palpation  BACK: no MLT or  R CVAT  EXT: no edema, calves SNT  SKIN: warm, dry, no rashes  NEURO: alert, ox3, cn II-XII intact, motor 5/5 all extremities, sensation intact and symmetric.      MDM, ASSESSMENT AND PLAN:   56 year old female with medullary sponge kidney and frequent kidney stones presenting with persistent suprapubic pain burning with urination and right CVA tenderness despite 10 days of Cipro and ESBL E coli on most recent urine culture.  Will admit patient for ESBL and start antibiotics in ED, specifically ertapenem.  Patient aware plan.  Will also obtain CT to evaluate stone burden.    Orders Placed This  Encounter   Procedures    CT Urography    Urinalysis with Culture Reflex, when indicated    Comprehensive Metabolic Panel - See Instructions    CBC w/ Diff Lavender    TSH, Blood - See Instructions    Urinalysis    Lactate, Blood - See Instructions       DIAGNOSTIC STUDIES  O2 sat interpretation: normal on RA  Rhythm interpretation:  Labs:  Results for orders placed or performed during the hospital encounter of 07/17/16   Urinalysis with Culture Reflex, when indicated   Result Value Ref Range    Type Not Specified     Color Amber Yellow    Appearance Clear Clear    Specific Gravity 1.023 1.002 - 1.030    pH 6.0 5.0 - 8.0    Protein Negative Negative    Glucose Negative Negative    Ketones Negative Negative    Bilirubin Negative Negative    Blood Negative Negative    Urobilinogen 2+ (A) Negative    Nitrite Positive (A) Negative    Leuk Esterase Negative Negative    WBC 3-5 (A) 0-2/HPF    RBC 0-2 0-2/HPF    Bacteria Rare None-Rare/HPF    Squam. Epithelial Cell 0-5(RARE) <6-10(FEW)    Mucus Rare None-Rare/HPF   Comprehensive Metabolic Panel - See Instructions   Result Value Ref Range    Glucose 100 (H) 70 - 99 mg/dL    BUN 19 6 - 20 mg/dL    Creatinine 0.69 0.51 - 0.95 mg/dL    GFR >60 mL/min    Sodium 144 136 - 145 mmol/L    Potassium 4.1 3.5 - 5.1 mmol/L    Chloride 104 98 - 107 mmol/L    Bicarbonate 25 22 - 29 mmol/L    Anion Gap 15 7 - 15 mmol/L    Calcium 9.1 8.5 - 10.6 mg/dL    Total Protein 6.6 6.0 - 8.0 g/dL    Albumin 4.2 3.5 - 5.2 g/dL    Bilirubin, Tot 0.27 <1.2 mg/dL    AST (SGOT) 17 0 - 32 U/L    ALT (SGPT) 14 0 - 33 U/L    Alkaline Phos 62 35 - 140 U/L   CBC w/ Diff Lavender   Result Value Ref Range    WBC 6.5 4.0 - 10.0 1000/mm3    RBC 3.95 3.90 - 5.20 mill/mm3    Hgb 13.1 11.2 - 15.7 gm/dL    Hct 38.7 34.0 - 45.0 %    MCV  98.0 (H) 79.0 - 95.0 um3    MCH 33.2 (H) 26.0 - 32.0 pgm    MCHC 33.9 32.0 - 36.0 g/dL    RDW 12.7 12.0 - 14.0 %    MPV 10.3 9.4 - 12.4 fL    Plt Count 237 140 - 370 1000/mm3     Segs 54 %    Lymphocytes 32 %    Monocytes 10 %    Eosinophils 3 %    Basophils 1 %    ANC-Automated 3.5 1.6 - 7.0 1000/mm3    Abs Lymphs 2.1 0.8 - 3.1 1000/mm3    Abs Monos 0.7 0.2 - 0.8 1000/mm3    Abs Eosinophils 0.2 <0.1 - 0.5 1000/mm3    Abs Basophils 0.1 <0.1 1000/mm3    Diff Type Automated    TSH, Blood - See Instructions   Result Value Ref Range    TSH 1.28 0.27 - 4.20 uIU/mL   Lactate, Blood - See Instructions   Result Value Ref Range    Lactate 1.3 0.5 - 2.0 mmol/L   Blood Culture Routine Blood Culture Set   Result Value Ref Range    Blood Culture Result Culture in progress.    Blood Culture Routine Blood Culture Set   Result Value Ref Range    Blood Culture Result Culture in progress.      Labs overall unremarkable.    X-rays:  Ct Urography    Result Date: 07/17/2016  Narrative: EXAM DESCRIPTION: CT UROGRAPHY CLINICAL HISTORY: Stones, pyelo. As per discussion with the clinician the patient reports a history of medullary sponge kidney. TECHNIQUE: COVERAGE: Abdomen and Pelvis IV CONTRAST: 100 ml Omnipaque 350 PHASES ACQUIRED: Noncontrast, nephrographic, delayed ORAL CONTRAST GIVEN: No ADVERSE EVENTS: None RECONSTRUCTIONS: Axial 2.44mm and sagittal/coronal 8mm  Up-to-date CT equipment and radiation dose reduction techniques were employed. CTDIvol: 10.8-14.95 mGy. DLP: 6269.48 mGy-Cm. COMPARISON: CT abdomen/pelvis 01/30/2016 [facility] FINDINGS: LUNG BASES: Unremarkable LIVER/BILIARY:A low-density lesion is present in segment 6 of the liver, which becomes Iso attenuating to the liver on the delayed phase. In conjunction with the prior imaging, this is consistent with a hemangioma. PANCREAS: Unremarkable SPLEEN:Unremarkable. ADRENAL GLANDS: Unremarkable KIDNEYS: A few punctate calculi are present within the right kidney. The number and size of calculi in both kidneys have decreased since the prior CT on 01/30/2016. A few low-density lesions are seen in both kidneys, not well seen on the noncontrast  study, the largest is in the inferior pole of the left kidney measuring 1.2 cm, these are too small to characterize and likely benign cysts. Otherwise, symmetric nephrograms with no evidence of a renal abscess. There is no hydronephrosis or perinephric stranding. The proximal and mid 2/3 of the right ureter are not opacified. The distal 1/3 of the right ureter is opacified extending into the ureterovesical junction. The entire left ureter is opacified. There are no filling defects or soft tissue masses in the renal collecting systems or ureters. STOMACH/DUODENUM: Unremarkable VASCULATURE: Unremarkable LYMPHATIC: Unremarkable SMALL & LARGE BOWEL: Unremarkable BLADDER/PELVIC ORGANS: There is a punctate 3 mm focus along the most posterior aspect of the bladder adjacent to the ureterovesical junction on the left side. On the delayed images, this is not well seen suggesting that it is in the bladder and has moved with the prone views. BONES/SOFT TISSUES: Calcified injection granuloma in the posterior left soft tissues OTHER: None CONCURRENT SUPERVISION: I have reviewed the images and agree with the resident interpretation. DOSE STATEMENT: [place]  CT scanners employ modern techniques for CT dose reduction, including protocol review, automatic exposure control, and iterative reconstruction techniques. These features assure that radiation dose levels in CT are optimized and are consistent with state-of-the-art, low dose CT practice.   Impression: IMPRESSION: CT Urogram Overall, the calculi burden has decreased when compared to the CT on 01/30/2016, with only punctate calculi seen in the right kidney. No hydronephrosis. A punctate 3 mm calculus is seen adjacent to the left ureterovesical junction, which on delayed views is felt to be in the bladder. Symmetric nephrograms with no perinephric stranding or renal abscess.       ED COURSE:  Per above.     07/17/16  1351 07/17/16  1556 07/17/16  1722  07/17/16  1804   BP: 158/94 158/81 158/99 145/84   Pulse: 73 76 78 77   Resp: 16 18 18 18    Temp: 98.6 F (37 C)  98.4 F (36.9 C) 97.9 F (36.6 C)   SpO2: 100% 98% 99% 99%         IMPRESSION:  ESBL UTI.    DISPOSITION:   Admit to Medicine, med surge level of care.     Rema Fendt, MD  07/17/16 [phone]

## 2016-07-17 NOTE — ED Notes (Signed)
Urine obtained from pt in triage, labeled, and sent to lab.

## 2016-07-17 NOTE — ED Notes (Signed)
Pt in stable condition. VSS. Resp even and unlabored. Pt c/o r flank, 4/10. No other changes noted.

## 2016-07-17 NOTE — ED Notes (Signed)
Alyssa Rn advised floor report in Epic, who will assume care of pt on 3 E.

## 2016-07-17 NOTE — ED Notes (Signed)
Pt in stable condition. VSS. Resp even and unlabored.Pt c/o r flank pain, 4-5/10. No other changes noted.

## 2016-07-17 NOTE — ED Notes (Signed)
Admitting md at bedside evaluating pt.

## 2016-07-17 NOTE — ED Notes (Signed)
Pt in stable condition. VSS. Resp even and unlabored. No other changes noted. Pt transferred to [facility] via gurney. Pt admitted to [facility].

## 2016-07-17 NOTE — Progress Notes (Signed)
Urology Follow-up Stone Note    Assessment/Plan:  Right pyelonephritis, refractory ESBL  Nephrolithiasis due to Low Volume urine,  Hyperoxaluria    I addressed urolithiasis with the patient today. I have personally reviewed the images, radiology report, labs, and old records. Plan for ED eval for possible inpt admission for Pyelonephritis IV tx.  Need to rule out C difficile in light of her new onset diarrhea with antibiotics as well.  I will recommend that the internal medicine service service evaluate her for possible admission reviewing her sensitivity profile either a troponin for meropenem might be could parental antibiotic choices.     Chief Complaint  - Nephrolithiasis     HPI  Melanie Walsh is a 56 year old female with a history of urolithiasis who presents for routine follow-up.  She continues to feel very fatigued, spiking fevers, with severe right flank and abdominal pain and now has diarrhea since starting the new ciprofloxacin antibiotic.    She reports no symptomatic stone events since her last visit. These were associated with right flank pain. Since her last visit, Melanie Walsh underwent no interval procedures for stones. At their last visit, the plan was therapeutic antibiotics.     Melanie Walsh was started on other at her  last visit. The patient reports n/a - no new medications were prescribed prescribed. Dietary changes recommended at the patient's last visit were Increasing fluid intake and Reducing oxalates in their diet. Dietary change adherence: She has been adhering to recommendations.     Allergies   Allergen Reactions    Adhesive Tape Rash    Hydrocodone-Acetaminophen Rash     Vomiting, nightmares    Latex Rash    Morphine Shortness of Breath and Swelling    Nickel Rash     Swelling and rash    Sulfa Drugs Rash     Related topical skin cream    Erythromycin Nausea Only     Current Outpatient Prescriptions   Medication Sig Dispense Refill    amLODIPINE (NORVASC) 5 MG tablet Take 5 mg by mouth daily.       Cholecalciferol (VITAMIN D PO)       ciprofloxacin (CIPRO) 500 MG tablet Take 1 tablet (500 mg) by mouth 2 times daily for 14 days. 28 tablet 0    fluticasone-salmeterol (ADVAIR) 100-50 MCG/DOSE inhaler Inhale 1 puff by mouth every 12 hours.      hydrochlorothiazide (HYDRODIURIL) 50 MG tablet Take 50 mg by mouth daily.      HYDROcodone-acetaminophen (NORCO) 5-325 MG tablet Take 1 tablet by mouth every 6 hours as needed for Moderate Pain (Pain Score 4-6). 40 tablet 0    levalbuterol (XOPENEX) 45 MCG/ACT inhaler Inhale 2 puffs by mouth every 6 hours as needed for Wheezing.      LEVOTHYROXINE SODIUM PO       METFORMIN HCL PO Take 200 mg by mouth daily.      Multiple Vitamins-Minerals (ONE DAILY CALCIUM/IRON PO)       oxybutynin (DITROPAN) 5 MG tablet Take 1 tablet (5 mg) by mouth 3 times daily. Start taking after surgery for urinary symptoms 21 tablet 1    phenazopyridine (PYRIDIUM) 100 MG tablet Take 2 tablets (200 mg) by mouth 3 times daily as needed (Bladder pain or dysuria). 21 tablet 0    phenazopyridine (PYRIDIUM) 200 MG tablet Take 1 tablet (200 mg) by mouth 3 times daily for 7 days. Start taking after surgery as needed for bladder/urinary pain 21 tablet 0  phenazopyridine (PYRIDIUM) 200 MG tablet Take 1 tablet (200 mg) by mouth 3 times daily as needed for Mild Pain (Pain Score 1-3) (dysuria). 21 tablet 1    tamsulosin (FLOMAX) 0.4 MG capsule Take 1 capsule (0.4 mg) by mouth daily (with food). 30 capsule 0    traMADol (ULTRAM) 50 MG tablet Take 1 tablet (50 mg) by mouth every 6 hours as needed for Moderate Pain (Pain Score 4-6). 15 tablet 0     No current facility-administered medications for this visit.      Past Medical History  Past Medical History:   Diagnosis Date    Asthma     HTN (hypertension)     Kidney disease, medullary sponge     Trigeminal neuralgia      Patient Active Problem List   Diagnosis    Nephrolithiasis       Past Surgical History  Past Surgical History:   Procedure  Laterality Date    ANTERIOR CRUCIATE LIGAMENT REPAIR      appendix      HYSTERECTOMY      OSTEOTOMY      SHOULDER SURGERY       Family History  Family History   Problem Relation Age of Onset    Cancer Father     Heart Disease Father     Hypertension Father     Cancer Maternal Grandfather     Cancer Paternal Grandmother     Stroke Paternal Grandmother     Cancer Paternal Grandfather     Diabetes Paternal Grandfather      Social History  Social History     Social History    Marital status: Married     Spouse name: N/A    Number of children: N/A    Years of education: N/A     Social History Main Topics    Smoking status: Never Smoker    Smokeless tobacco: Never Used    Alcohol use Yes      Comment: rare    Drug use: No      Comment: denies    Sexual activity: Not Asked     Social Activities of Daily Living Present    None     Social History Narrative       ROS See pertinent positives and negatives in HPI. All other systems reviewed and found negative.     Objective  Physical Exam  Vitals:    07/17/16 0958   BP: (!) 185/95   BP Location: Right arm   BP Patient Position: Sitting   BP cuff size: Regular   Pulse: 92     GENERAL: The patient is a teary eyed fatigued appearging but alert, cooperative female  HEENT: normalocephalic/atraumatic  NECK: midline trachea. No jugular venous distention  BACK: severe Right  CVAT  EXTREMITIES: Joints are normal without redness or swelling.   SKIN: There is no edema or cyanosis.   NEURO: no motor or sensory deficits.  Alert and Oriented x 3    Lab Review: The following lab results were personally reviewed by me:  BMP Results Latest Ref Rng & Units 03/29/2016 03/28/2016 03/27/2016   GLU 70 - 99 mg/dL 106(H) 117(H) 102(H)   BUN 6 - 20 mg/dL 16 19 23(H)   CREAT 0.51 - 0.95 mg/dL 0.61 0.91 0.90   GFRNON mL/min >60 >60 >60   NA 136 - 145 mmol/L 142 143 141   K 3.5 - 5.1 mmol/L 4.1 4.8 4.5   CL 98 -  107 mmol/L 105 106 104   BICARB 22 - 29 mmol/L 27 26 26    ANION 7 - 15  mmol/L 10 11 11    [place] 8.5 - 10.6 mg/dL 8.8 9.6 9.0     CBC Results Latest Ref Rng & Units 03/28/2016 03/27/2016   WBC 4.0 - 10.0 1000/mm3 7.9 8.1   RBC 3.90 - 5.20 mill/mm3 4.08 4.05   HGB 11.2 - 15.7 gm/dL 13.5 13.0   HCT 34.0 - 45.0 % 41.3 40.8   PLT 140 - 370 1000/mm3 223 219   No flowsheet data found.    24-hour urine collection - this was personally reviewed and results were discussed with the patient. It demonstrates the following:    [facility] Results Latest Ref Rng & Units 05/12/2016 05/12/2016 11/20/2014   VOL 0.5 - 5 Liters/Day 1.5 1.5 0.9   SS CAOX 6 - 10 12.5(A) 12.5(A) 14.7(A)   [place] 0 - 200 mg/day 175 175 236(A)   OXALATE 20 - 40 mg/day 61.0(A) 61.0(A) 32.0   CITRATE 550 mg/day 753 753 456(A)   SS CaP 0.5 - 2 0.6 0.6 1.2   pH 5.8 - 6.2 5.7(A) 5.7(A) 5.6(A)   SS URIC 0 - 1 1 1  2(A)   URIC ACID CONC 0 - 0.750 g/day 0.473 0.473 0.551   NA 50 - 150 mmol/day 168(A) 168(A) 126   K 20 - 100 mmol/day 74 74 46   MG 30 - 120 mg/day 110 110 85   PHOS 0.6 - 1.2 g/day 0.7 0.7 0.8   NH4 15 - 60 mmol/day 29 29 30    CHOL 70 - 250 mmol/day 205 205 126   SULF 20 - 80 mEq/day 23 23 38   UREA 6 - 14 g/day 6.7 6.7 10.3   PCR 0.8 - 1.4 g/kg/day 0.7(A) 0.7(A) 1.1   CREAT mg/day 1093.0 1093.0 1289.0   CR/KG 15 - 20 mg/kg/day 14.2(A) 14.2(A) 18.3   [place]/KG 0 - 4.00 mg/kg/day 2.30 2.30 3.40   [place]/CR 0 - 140 mg/g 160(A) 160(A) 183(A)       Imaging  The following imaging was personally reviewed and the findings were discussed with the patient.    Stone and Imaging 07/10/2016   [facility]   Imaging Date 05/13/2016   Image Type CT abdomen/pelvis   Was there an increase in stone burden compared to previous imaging? No   Collecting System Right   Number of stones on imaging 3   Size of stone burden on imaging (mm) 6   Stone location on imaging; choose all that apply mid kidney   Severity of hydronephrosis no hydronephrosis   Choose the most appropriate description Multiple stones with simple anatomy   Size of stone burden on imaging  (mm) 2   Number of stones on imaging 1   Stone location on imaging; choose all that apply mid kidney   Severity of hydronephrosis no hydronephrosis   Choose the most appropriate description A solitary stone in the mid/lower pole with simple anatomy       Sherrian Divers, MD

## 2016-07-18 LAB — CBC WITH DIFF, BLOOD
ANC-Automated: 3.4 10*3/uL (ref 1.6–7.0)
Abs Basophils: 0.1 10*3/uL (ref <0.1)
Abs Eosinophils: 0.2 10*3/uL (ref 0.1–0.5)
Abs Lymphs: 1.9 10*3/uL (ref 0.8–3.1)
Abs Monos: 0.7 10*3/uL (ref 0.2–0.8)
Basophils: 1 %
Eosinophils: 3 %
Hct: 37 % (ref 34.0–45.0)
Hgb: 12.4 gm/dL (ref 11.2–15.7)
Lymphocytes: 30 %
MCH: 33.2 pg — ABNORMAL HIGH (ref 26.0–32.0)
MCHC: 33.5 g/dL (ref 32.0–36.0)
MCV: 99.2 um3 — ABNORMAL HIGH (ref 79.0–95.0)
MPV: 10.3 fL (ref 9.4–12.4)
Monocytes: 11 %
Plt Count: 216 10*3/uL (ref 140–370)
RBC: 3.73 10*6/uL — ABNORMAL LOW (ref 3.90–5.20)
RDW: 12.8 % (ref 12.0–14.0)
Segs: 55 %
WBC: 6.2 10*3/uL (ref 4.0–10.0)

## 2016-07-18 LAB — BASIC METABOLIC PANEL, BLOOD
Anion Gap: 10 mmol/L (ref 7–15)
BUN: 16 mg/dL (ref 6–20)
Bicarbonate: 27 mmol/L (ref 22–29)
Calcium: 8.5 mg/dL (ref 8.5–10.6)
Chloride: 106 mmol/L (ref 98–107)
Creatinine: 0.81 mg/dL (ref 0.51–0.95)
GFR: >60 mL/min
Glucose: 100 mg/dL — ABNORMAL HIGH (ref 70–99)
Potassium: 4.1 mmol/L (ref 3.5–5.1)
Sodium: 143 mmol/L (ref 136–145)

## 2016-07-18 LAB — MRSA SURVEILLANCE CULTURE

## 2016-07-18 MED ORDER — OXYBUTYNIN CHLORIDE 10 MG OR TB24
10.0000 mg | ORAL_TABLET | Freq: Every day | ORAL | Status: DC
Start: 2016-07-19 — End: 2016-07-19
  Administered 2016-07-19: 10 mg via ORAL
  Filled 2016-07-18: qty 1

## 2016-07-18 MED ORDER — PHENAZOPYRIDINE HCL 100 MG OR TABS
200.0000 mg | ORAL_TABLET | Freq: Three times a day (TID) | ORAL | Status: DC
Start: 2016-07-18 — End: 2016-07-19
  Administered 2016-07-18 – 2016-07-19 (×3): 200 mg via ORAL
  Filled 2016-07-18 (×3): qty 2

## 2016-07-18 MED ORDER — CARBAMAZEPINE 200 MG OR TABS
200.0000 mg | ORAL_TABLET | Freq: Every evening | ORAL | Status: DC
Start: 2016-07-18 — End: 2016-07-19
  Administered 2016-07-18: 200 mg via ORAL
  Filled 2016-07-18: qty 1

## 2016-07-18 MED ORDER — SACCHAROMYCES BOULARDII 250 MG OR CAPS
250.0000 mg | ORAL_CAPSULE | Freq: Two times a day (BID) | ORAL | Status: DC
Start: 2016-07-19 — End: 2016-07-19
  Administered 2016-07-19: 250 mg via ORAL
  Filled 2016-07-18: qty 1

## 2016-07-18 NOTE — Interdisciplinary (Signed)
07/18/16 1542   Patient Information   Where was the patient admitted from? home   Why is Patient in the Hospital? ESBL urine   Prior to Level of Function Ambulatory/Independent with ADL's   Primary Caretaker(s) Spouse   Permission to [place] Affiliation No   Discharge Planning   Living Arrangements Spouse / significant other   Type of Residence Private residence   Has discharge transport been arranged? Yes   Equities trader in Discharge Planning Yes   Patient Has Decision Making Capacity Yes   Patient/Family/Legal/Surrogate Decision Maker Has Been Given Options And Choice In The Selection of Post-Acute Care Providers Yes   Public Health Clearance Needed Not Applicable   Readmission Risk Assessment   Readmission Within 30 Days of Discharge No   Admission Was Unplanned   Recent Hospitalizations (Within Last 6 Months) No   High Risk For Readmission No   Initial Assessment Completed   CM Initial Assessment Completed   If Medicare or Medicare Managed Care: Important Message from Medicare Given   If Medicare or Medicare Managed Care: Important Message from [facility] Given Not Applicable     Pt is alert and oriented x4.  She lives with her spouse.  Pt is a retired Marine scientist.  No DME in the home.  Cultures are pending.  Pt may be a home I.v. Unsure at this time.    CM to follow up.  Cultures pending, but sometimes ESBL will be a home I.v.  Pt doesn't have a preference on company.

## 2016-07-18 NOTE — Interdisciplinary (Signed)
Dr. Ardelia Mems in the unit aware pt takes amlodipine as rescue dose if hydrochlorothiazide doesn't lower her BP. Per MD will d/c it and reorder when pt needs it and will modify Capamazepine to 200mg  at night

## 2016-07-18 NOTE — Progress Notes (Signed)
Admitted on:07/17/2016                                                                                  [facility] Summary   Melanie Walsh is a 56 year old female with nephrolithiasis s/p cystourethroscopy and lithotripsy 03/2016 who was sent to the ED from Urology clinic for further work-up of dysuria, flank pain, fevers.       24 Hour Course/Overnight Events   Afebrile    CTAP negative for any large stone; also negative for perinephric fat stranding   Subjective/Review of Systems   Reports R flank pain, bladder pain    Denies nausea, abdominal pain   Physical Exam   BP  Min: 144/79  Max: 158/99  Temp  Min: 97.7 F (36.5 C)  Max: 98.4 F (36.9 C)  Pulse  Min: 76  Max: 81  Resp  Min: 18  Max: 20  SpO2  Min: 96 %  Max: 99 %    BP (!) 151/96 (BP Location: Right arm, BP Patient Position: Sitting)   Pulse 81   Temp 97.7 F (36.5 C)   Resp 18   Ht 5' 4" (1.626 m)   Wt 81.3 kg (179 lb 3.2 oz)   LMP 03/31/2007 (Approximate)   SpO2 98%   BMI 30.76 kg/m2    03/30 0600 - 03/31 0559  In: 2992 [P.O.:240; I.V.:1050]  Out: 200 [Urine:200]  No results for input(s): GLUCPOCT in the last 72 hours.    Gen: awake, lying in bed in NAD  HEENT: MMM  CV: RRR, no M/R/G  Pulm: CTAB, no W/R/R  GI: obese, soft, NT, ND, NABS  GU: +R CVA tenderness, +suprapubic tenderness  Extr: No edema, normal muscle bulk  Skin: Warm, Dry, No Rashes  Neuro: A&Ox3    Data     Na 143 (03/31) CL 106 (03/31) BUN 16 (03/31) GLU   100* (03/31)   K 4.1 (03/31) CO2 27 (03/31) Cr 0.81 (03/31)      WBC 6.2 (03/31) HGB 12.4 (03/31) PLT 216 (03/31)    HCT 37.0 (03/31)      PT   PTT     INR        TP 6.6 (03/30) ALT 14 (03/30) TBILI 0.27 (03/30) ALK PHOS  62 (03/30)   ALB 4.2 (03/30) AST 17 (03/30) DBILI        Lab Results   Component Value Date    COLORUA Amber 07/17/2016    APPEARUA Clear 07/17/2016    GLUCOSEUA Negative 07/17/2016    BILIUA Negative 07/17/2016    KETONEUA Negative 07/17/2016    SGUA 1.023 07/17/2016    BLOODUA Negative 07/17/2016    PHUA 6.0  07/17/2016    PROTEINUA Negative 07/17/2016    UROBILUA 2+ (A) 07/17/2016    NITRITEUA Positive (A) 07/17/2016    LEUKESTUA Negative 07/17/2016    WBCUA 3-5 (A) 07/17/2016    RBCUA 0-2 07/17/2016       03/30 UCx - NGTD  03/30 BCx - NGTD  Inpatient Medications   Scheduled Medications   [START ON 07/19/2016] carBAMazepine  200 mg HS    docusate sodium  250 mg  HS    ertapenem (INVANZ) IV  1,000 mg Q24H NR    hydrochlorothiazide  50 mg Daily    levothyroxine  50 mcg Daily    senna  2 tablet QAM    sodium chloride (PF)  3 mL Q8H      Continuous Medications   sodium chloride        PRN Medications   acetaminophen  650 mg Q4H PRN    Or    acetaminophen  650 mg Q4H PRN    Or    acetaminophen  650 mg Q4H PRN    acetaminophen  650 mg Q4H PRN    albuterol  2 puff Q6H PRN    aluminum-magnesium-simethicone  30 mL Q6H PRN    nalOXone  0.1 mg Q2 Min PRN    ondansetron  4 mg Q6H PRN    oxyCODONE  10 mg Q4H PRN    oxyCODONE  5 mg Q4H PRN    oxyCODONE  5 mg Q4H PRN    sodium chloride (PF)  3 mL PRN    sodium chloride   Continuous PRN    zolpidem  5 mg Nightly PRN             Assessment & Plan     #R Flank Pain/Bladder Pain/ESBL UTI: Began having R flank pain, Started Ciprofloxacin on 07/09/15.  Still has dysuria and flank pain. No WBC, fever, perinephric stranding on CT.   --continue Ertapenem empirically  --f/u urine cultures  --pyridium (as per Dr. Nuala Alpha prior note)  --oxybutynin (as per Dr. Nuala Alpha prior note)    #Hx of Nephrolithiasis: underwent cystourethroscopy with right ureteral stent placement, right ureteroscopy with laser lithotripsy, cystourethroscopy with left ureteral stent placement and left ureteroscopy with laser lithotripsy on 03/30/2016 with Dr. Reather Converse. CT Urogram: "calculi burden has decreased when compared to the CT on 01/30/2016, with only punctate calculi seen in the right kidney. No hydronephrosis.  A punctate 3 mm calculus is seen adjacent to the left ureterovesical junction, which on delayed  views is felt to be in the bladder."  --per Urology, Dr. Reather Converse aware of patient's admission, no intervention planned  --given patient has stents and now an ESBL UTI, risk of stent colonization and recurrent infection; will discuss stent removal with urology    #Diarrhea:  --send C. Diff    #Pain:  --Oxycodone PRN       Hospital Problem List   Active Hospital Problems    Diagnosis POA    ESBL (extended spectrum beta-lactamase) producing bacteria infection [A49.9, Z16.12] Yes    UTI (urinary tract infection) [N39.0] Yes      Resolved Hospital Problems    Diagnosis POA   No resolved problems to display.        Inpatient Checklist   Discharge planning: clinical improvement and consultant input  Foley: No foley catheter  VTE prophylaxis: ambulating  Diet: Diet Regular  IV fluids: No IV fluids  Access: PIVs  Code status: Full Code              Precious Gilding, MD  Division of [facility] Medicine  Pager [phone]

## 2016-07-18 NOTE — Plan of Care (Signed)
Problem: Pain - Acute  Goal: Communication of presence of pain  Outcome: Progressing toward goal, anticipate improvement over: next 12-24 hours  Patient communicates pain effectively on 10 point scale. Patient v/u and agreeable to current pain management plan. Non-pharma interventions encouraged. Patient appears to find relief with oxycodone as ordered. Will continue to monitor.   Goal: Control of acute pain  Outcome: Progressing toward goal, anticipate improvement over: next 12-24 hours

## 2016-07-18 NOTE — Plan of Care (Signed)
Problem: Pain - Acute  Goal: Communication of presence of pain  Outcome: Goal Met    Goal: Control of acute pain  Outcome: Goal Met  Pain under controlled on current pain regimen

## 2016-07-19 LAB — COMPREHENSIVE METABOLIC PANEL, BLOOD
ALT (SGPT): 13 U/L (ref 0–33)
AST (SGOT): 13 U/L (ref 0–32)
Albumin: 3.7 g/dL (ref 3.5–5.2)
Alkaline Phos: 55 U/L (ref 35–140)
Anion Gap: 12 mmol/L (ref 7–15)
BUN: 21 mg/dL — ABNORMAL HIGH (ref 6–20)
Bicarbonate: 29 mmol/L (ref 22–29)
Bilirubin, Tot: 0.22 mg/dL (ref <1.2)
Calcium: 9 mg/dL (ref 8.5–10.6)
Chloride: 102 mmol/L (ref 98–107)
Creatinine: 0.85 mg/dL (ref 0.51–0.95)
GFR: >60 mL/min
Glucose: 103 mg/dL — ABNORMAL HIGH (ref 70–99)
Potassium: 3.7 mmol/L (ref 3.5–5.1)
Sodium: 143 mmol/L (ref 136–145)
Total Protein: 6.1 g/dL (ref 6.0–8.0)

## 2016-07-19 LAB — CBC WITH DIFF, BLOOD
ANC-Automated: 3 10*3/uL (ref 1.6–7.0)
Abs Basophils: 0.1 10*3/uL (ref <0.1)
Abs Eosinophils: 0.2 10*3/uL (ref 0.1–0.5)
Abs Lymphs: 1.8 10*3/uL (ref 0.8–3.1)
Abs Monos: 0.6 10*3/uL (ref 0.2–0.8)
Basophils: 1 %
Eosinophils: 4 %
Hct: 40.1 % (ref 34.0–45.0)
Hgb: 13.6 gm/dL (ref 11.2–15.7)
Lymphocytes: 32 %
MCH: 33.2 pg — ABNORMAL HIGH (ref 26.0–32.0)
MCHC: 33.9 g/dL (ref 32.0–36.0)
MCV: 97.8 um3 — ABNORMAL HIGH (ref 79.0–95.0)
MPV: 10.5 fL (ref 9.4–12.4)
Monocytes: 11 %
Plt Count: 224 10*3/uL (ref 140–370)
RBC: 4.1 10*6/uL (ref 3.90–5.20)
RDW: 12.6 % (ref 12.0–14.0)
Segs: 52 %
WBC: 5.8 10*3/uL (ref 4.0–10.0)

## 2016-07-19 LAB — MAGNESIUM, BLOOD: Magnesium: 2 mg/dL (ref 1.6–2.6)

## 2016-07-19 LAB — URINE CULTURE: Urine Culture Result: NO GROWTH

## 2016-07-19 LAB — PHOSPHORUS, BLOOD: Phosphorous: 3.7 mg/dL (ref 2.7–4.5)

## 2016-07-19 MED ORDER — PHENAZOPYRIDINE HCL 100 MG OR TABS
200.0000 mg | ORAL_TABLET | Freq: Three times a day (TID) | ORAL | 0 refills | Status: AC | PRN
Start: 2016-07-19 — End: 2016-08-02

## 2016-07-19 MED ORDER — CARBAMAZEPINE 200 MG OR TABS
200.0000 mg | ORAL_TABLET | Freq: Every evening | ORAL | 0 refills | Status: DC
Start: 2016-07-19 — End: 2016-09-07

## 2016-07-19 NOTE — Interdisciplinary (Signed)
Patient discharged to home, discharge instructions regarding medication, follow up appointment, diet, activity, notifying Dr. Gearldine Bienenstock.Verbal understanding to education provided. IV d/c. Family ambulated off unit with her husband.

## 2016-07-19 NOTE — Discharge Instructions (Signed)
Diagnosis and Reason for Admission    You were admitted to the hospital for the following reason(s):  Flank pain, Bladder pain    Your full diagnosis list is located on this After Visit Summary in the Hospital Problems section.    What [facility] Stay    The main tests and treatments done for you during this hospitalization were:      CT scan of the abdomen and pelvis - we did not find any evidence of an obstructing stone or inflammation around the kidney    You were placed on IV antibiotics initially, but your urine culture did not grow any new or resistant bacteria, therefore IV antibiotics were discontinued.  You have completed ~10 days of Ciprofloxacin for your UTI so you may stop this medication.      Pyridium was prescribed.    The following items are important for after you are discharged from the hospital:     Follow-up with Dr. Reather Converse in Urology clinic to discuss further medical therapies to prevent stone formation.    Instructions for After Discharge    Your diet at home should be a low-salt diet.    Sufficient fluid intake distributed throughout the day to produce at least 2 liters of urine per day. This will increase the urine flow rate and lower the urine solute concentration, both of which may protect against stone formation.    ?Avoiding excessive animal protein in the diet. Although it has not been proven that a low-protein diet will reduce the incidence of stone formation  ?Limiting dietary sodium to 100 meq/day. A low-sodium diet can enhance proximal sodium and calcium reabsorption, leading to a reduction in calcium excretion.  ?Increasing dietary potassium intake, as this is associated with substantially reduced risk in men and older women.  ?Limiting dietary sucrose and fructose.  ?Limiting intake of high oxalate foods and supplemental vitamin C in patients with calcium oxalate stones.     Your activity level at home should be:  regular activity.    Specific activity restrictions:     None    Wound or tube care instructions:  None    Your medication list is located on this After Visit Summary in the Current Discharge Medication List section.  Your nurse will review this information with you before you leave the hospital.    It is very important for you to keep a current medication list with you in order to assist your doctors with your medical care.  Bring this After Visit Summary with you to your follow up appointments.         Reasons to Contact a Doctor Urgently    Call 911 or return to the hospital immediately if you experience fever    Your hospital physician at the time of hospital discharge is Precious Gilding.    Your physicians strive to explain things in a way you can understand.  If you have any questions or concerns about your hospital care or your medications, your hospital physician can be contacted in the following manner:  Pager number [phone] for Dr. Precious Gilding ([place]).    New problems and symptoms unrelated to your hospitalization are best addressed by your primary outpatient physician (PCP), as are requests for medication refills or appointment referrals after hospital discharge.  However, if new issues arise before you can see or contact your PCP, your hospital physician may be able to assist with these issues during this time.  What Needs to Happen Next After Discharge -- Appointments and Follow Up    Any appointments already scheduled at [facility] clinics will be listed in the Future Appointments section at the top of this After Visit Summary.      Sometimes tests performed in the hospital do not yet have results by the time a patient goes home.  The following key tests will need to be followed up at your next appointment: None    Medical Home Information    Your primary care provider or clinic currently on file at [facility] is: [facility]    Handouts Given to You (if applicable)

## 2016-07-19 NOTE — Plan of Care (Signed)
Problem: Pain - Acute  Goal: Communication of presence of pain  Outcome: Progressing toward goal, anticipate improvement over: next 12-24 hours  Patient able to communicate pain using pain scale. PRN oxy given x1 with effectiveness.

## 2016-07-20 ENCOUNTER — Telehealth (HOSPITAL_BASED_OUTPATIENT_CLINIC_OR_DEPARTMENT_OTHER): Payer: Self-pay

## 2016-07-20 ENCOUNTER — Telehealth (HOSPITAL_BASED_OUTPATIENT_CLINIC_OR_DEPARTMENT_OTHER): Payer: Self-pay | Admitting: Urology

## 2016-07-20 NOTE — Telephone Encounter (Signed)
Pt requesting to be seen by Dr. Reather Converse on 4/13 or sooner as she was told to follow up on 4/6 pt was made aware Dr Reather Converse is not in the office and pt requested to speak with clinic manager. I am unable to scheduled appt until 4/20 and pt did not want to schedule appt. Pt was offered to follow up with different MD as Dr Reather Converse is [place] and also did not want to. Please assist by calling pt at  [phone] pt requesting call from office manager.

## 2016-07-20 NOTE — Telephone Encounter (Signed)
Returned pt's phone call.    Assisted with scheduling appt with Dr Reather Converse 4/13.  Pt elected 2pm at [facility].    Pt aware MD is not in the office this week and we are unable to schedule appt this week.  I asked pt if she would need to speak to nurse, but she declined.

## 2016-07-20 NOTE — Telephone Encounter (Signed)
Transitional Telephonic Nurse (TTN ) Post-Discharge Follow-Up Phone Call   Triggered Alert:  Follow-up.     Patient hospitalized for complaints of flank pain and fever. Spoke with patient who reports that she continues to have flank pain. Discusssed pain management with patient and to continue taking Norco and pyridium as prescribed. Rated current pain as 3/10 on the numeric pain scale. Reports that she will call the clinic of Dr. Reather Converse today to schedule follow-up appointment. No issue with transportation. Reviewed After Visit Summary with patient including medications, dieet (low-salt, avoid animal products, increase fluid intake)), activity, and when to seek medical care. Instructed to call clinic with questions/issues or to call TTN if needing assistance.    Rodena Medin Temesha Queener, RN, MSN, CNS, CPAN  Transitional Telephonic Nurse Consultant  [phone]  [email or web address]

## 2016-07-21 NOTE — Discharge Summary (Signed)
Date of Admission:  07/17/2016  Date of Discharge:  07/19/2016    Patient Name:  Melanie Walsh    Principal Diagnosis (required):  Flank Pain, Suprapubic pain    Hospital Problem List (required):  Active Hospital Problems    Diagnosis    ESBL (extended spectrum beta-lactamase) producing bacteria infection [A49.9, Z16.12]    UTI (urinary tract infection) [N39.0]      Resolved Hospital Problems    Diagnosis   No resolved problems to display.       Additional Hospital Diagnoses ("rule out" or "suspected" diagnoses, etc.):  Rule out obstructing nephrolithiasis  Rule out Pyelonephritis    Principal Procedure During This Hospitalization (required):  CT imaging of Abdomen/Pelvis    Other Procedures Performed During This Hospitalization (required):  None    Procedure results are available in Chart Review in Epic.  For those providers external to [facility], the key procedure results are listed below:  Overall, the calculi burden has decreased when compared to the CT on 01/30/2016, with only punctate calculi seen in the right kidney. No hydronephrosis.    A punctate 3 mm calculus is seen adjacent to the left ureterovesical junction, which on delayed views is felt to be in the bladder.    Symmetric nephrograms with no perinephric stranding or renal abscess      Consultations Obtained During This Hospitalization:  None    Key consultant recommendations:    CT scan discussed with Urology - no stone that is large enough for intervention    Reason for Admission to the Hospital / History of Present Illness:  56yo F with h/o nephrolithiasis s/p cystourethroscopy and lithotripsy 03/2016 sent from urology clinic with c/f pyelonephritis. Patient is s/p course of cipro for esbl e coli UTI diagnosed 3/21, reports subjective fevers, nausea, persistent pain despite this. Pain localized to R flank and to left side of bladder. States pain feels like when she has had stones in the past. Does not have dysuria per se but rather the pain comes after  urination and feels c/w stones. Patient also notes watery diarrhea multiple times/day (2-3 BMs after each meal), recently started on probiotic but has not noted improvement.    Hospital Course by Problem (required):  R Flank Pain/Bladder Pain/ESBL UTI: Began having R flank pain, Started Ciprofloxacin on 07/09/15.  Still has dysuria and flank pain. No WBC, fever, perinephric stranding on CT. Patient started on Ertapenem initially out of concern for organism resistant to Ciprofloxacin.  UCx showed no growth at 48 hours. BCx remained negative.  Patient given pyridium, oxybutynin, with improvement in symptoms.  No significant nephrolithiasis was seen on CT Urogram (discussed with Urology) that would require intervention. Dr. Reather Converse notified of admission and patient to schedule f/u in clinic with Dr. Reather Converse.       #Hx of Nephrolithiasis: underwent cystourethroscopy with right ureteral stent placement, right ureteroscopy with laser lithotripsy, cystourethroscopy with left ureteral stent placement and left ureteroscopy with laser lithotripsyon 03/30/2016 with Dr. Reather Converse. CT Urogram: "calculi burden has decreased when compared to the CT on 01/30/2016, with only punctate calculi seen in the right kidney. No hydronephrosis. CT urogram this admission showed a punctate 3 mm calculus is seen adjacent to the left ureterovesical junction, which on delayed views is felt to be in the bladder." Per Urology, Dr. Reather Converse aware of patient's admission, no intervention planned. Of note, patient reports that no stents were placed during procedure 03/2016.      Tests Outstanding at Discharge Requiring Follow Up:  None    Discharge Condition (required):  Improved.    Key Physical Exam Findings at Discharge:  No significant physical examination findings at the time of discharge.    Discharge Diet:  Low sodium, Low oxalate .    Discharge Medications:     What To Do With Your Medications      CHANGE how you take these medications       Add'l Info     carBAMazepine 200 MG tablet   Commonly known as:  TEGRETOL   Take 1 tablet (200 mg) by mouth at bedtime.    Quantity:  30 tablet   Refills:  0   What changed:    - how much to take  - when to take this       phenazopyridine 100 MG tablet   Commonly known as:  PYRIDIUM   Take 2 tablets (200 mg) by mouth 3 times daily as needed for Mild Pain (Pain Score 1-3) or Moderate Pain (Pain Score 4-6) (Bladder pain or dysuria) for up to 14 days.    Quantity:  42 tablet   Refills:  0   What changed:    - reasons to take this  - Another medication with the same name was removed. Continue taking this medication, and follow the directions you see here.         CONTINUE taking these medications       Add'l Info    amLODIPINE 5 MG tablet   Commonly known as:  NORVASC   Take 5 mg by mouth daily.    Refills:  0       fluticasone-salmeterol 100-50 MCG/DOSE inhaler   Commonly known as:  ADVAIR   Inhale 1 puff by mouth every 12 hours.    Refills:  0       hydrochlorothiazide 50 MG tablet   Commonly known as:  HYDRODIURIL   Take 50 mg by mouth daily.    Refills:  0       HYDROcodone-acetaminophen 5-325 MG tablet   Commonly known as:  NORCO   Take 1 tablet by mouth every 6 hours as needed for Moderate Pain (Pain Score 4-6).    Quantity:  40 tablet   Refills:  0       levalbuterol 45 MCG/ACT inhaler   Commonly known as:  XOPENEX   Inhale 2 puffs by mouth every 6 hours as needed for Wheezing.    Refills:  0       LEVOTHYROXINE SODIUM PO   Take 50 mcg by mouth daily.    Refills:  0       METFORMIN HCL PO   Take 200 mg by mouth daily.    Refills:  0       ONE DAILY CALCIUM/IRON PO    Refills:  0       oxybutynin 5 MG tablet   Commonly known as:  DITROPAN   Take 1 tablet (5 mg) by mouth 3 times daily. Start taking after surgery for urinary symptoms    Quantity:  21 tablet   Refills:  1       tamsulosin 0.4 MG capsule   Commonly known as:  FLOMAX   Take 1 capsule (0.4 mg) by mouth daily (with food).    Quantity:  30 capsule   Refills:  0        traMADol 50 MG tablet   Commonly known as:  ULTRAM   Take 1 tablet (50 mg) by  mouth every 6 hours as needed for Moderate Pain (Pain Score 4-6).    Quantity:  15 tablet   Refills:  0       VITAMIN D PO    Refills:  0         STOP taking these medications          ciprofloxacin 500 MG tablet   Commonly known as:  CIPRO            Where to Get Your Medications      These medications were sent to [facility], [place] - [address] AT [address]  [place] [address] [place] [address]     Phone:  [phone]     phenazopyridine 100 MG tablet         Information about where to get these medications is not yet available     ! Ask your nurse or doctor about these medications     carBAMazepine 200 MG tablet             Allergies:  Allergies   Allergen Reactions    Adhesive Tape Rash    Latex Rash    Morphine Shortness of Breath and Swelling    Nickel Rash     Swelling and rash    Petroleum Jelly [Petrolatum] Rash    Sulfa Drugs Rash     Related topical skin cream    Erythromycin Nausea Only       Discharge Disposition:  Home.    Discharge Code Status:  Full code / full care  This code status is not changed from the time of admission.    Follow Up Appointments:    Scheduled appointments:  Future Appointments  Date Time Provider [place]   08/07/2016 2:00 PM Sur, Layne Benton, MD [facility]       For appointments requested for after discharge that have not yet been scheduled, refer to the [facility] Discharge Referrals section of the After Visit Summary.    Discharging 83 Contact Information:  [facility] Medicine phone triage at [phone].

## 2016-07-22 LAB — BLOOD CULTURE
Blood Culture Result: NO GROWTH
Blood Culture Result: NO GROWTH

## 2016-07-31 ENCOUNTER — Ambulatory Visit: Payer: BC Managed Care – PPO | Attending: Urology | Admitting: Urology

## 2016-07-31 VITALS — BP 149/87 | HR 89 | Temp 97.7°F | Resp 17 | Ht 64.0 in | Wt 179.0 lb

## 2016-07-31 DIAGNOSIS — Z0000 Encounter for general adult medical examination without abnormal findings: Secondary | ICD-10-CM

## 2016-07-31 DIAGNOSIS — R109 Unspecified abdominal pain: Principal | ICD-10-CM | POA: Insufficient documentation

## 2016-07-31 DIAGNOSIS — N201 Calculus of ureter: Secondary | ICD-10-CM | POA: Insufficient documentation

## 2016-07-31 LAB — URINALYSIS
Bilirubin: NEGATIVE
Blood: NEGATIVE
Glucose: NEGATIVE
Leuk Esterase: NEGATIVE
Nitrite: NEGATIVE
Specific Gravity: 1.033 — ABNORMAL HIGH (ref 1.002–1.030)
Urobilinogen: NEGATIVE
pH: 5 (ref 5.0–8.0)

## 2016-07-31 MED ORDER — FLUCONAZOLE 150 MG OR TABS
150.00 mg | ORAL_TABLET | Freq: Every day | ORAL | 0 refills | Status: AC
Start: 2016-07-31 — End: 2016-08-07

## 2016-07-31 NOTE — Progress Notes (Signed)
Urology Follow-up Stone Note    Assessment/Plan:  R/o Left UVJ stone  Right pyelonephritis, refractory ESBL  Nephrolithiasis due to Low Volume urine, Hyperoxaluria    I addressed urolithiasis with the patient today. I have personally reviewed the images, radiology report, labs, and old records.  Return to clinic after urinalysis, renal bladder ultrasound and transvaginal ultrasound rule out persistent left UVJ stone.  I believe her irritative voiding symptoms are most likely consistent with UVJ stone.  There is a chance it represents residual UTI but she did receive recent hospitalization with IV meropenem which could have treated that.  Patient also needs to follow up with the our dietitian to evaluate the hyperoxaluria which the patient states she has been strictly reducing her diet.  Plan for imaging.     Chief Complaint  - Nephrolithiasis     HPI  Melanie Walsh is a 56 year old female with a history of urolithiasis who presents for routine follow-up.  Patient was recently admitted for ESBL with IV meropenem and now feels markedly better.  She had a CT done during hospitalization which showed a left UVJ stone that may represent a bladder stone.          She reports one symptomatic stone event since her last visit. These were associated with pyelonephritis. Since her last visit, Melanie Walsh underwent no interval procedures for stones. At their last visit, the plan was therapeutic antibiotics.     Melanie Walsh was started on no new medications at her  last visit. The patient reports n/a - no new medications were prescribed prescribed. Dietary changes recommended at the patient's last visit were Increasing fluid intake and Reducing oxalates in their diet. Dietary change adherence: She has been adhering to recommendations.     Allergies   Allergen Reactions    Adhesive Tape Rash    Latex Rash    Morphine Shortness of Breath and Swelling    Nickel Rash     Swelling and rash    Petroleum Jelly [Petrolatum] Rash    Sulfa Drugs  Rash     Related topical skin cream    Erythromycin Nausea Only     Current Outpatient Prescriptions   Medication Sig Dispense Refill    amLODIPINE (NORVASC) 5 MG tablet Take 5 mg by mouth daily.      carBAMazepine (TEGRETOL) 200 MG tablet Take 1 tablet (200 mg) by mouth at bedtime. 30 tablet 0    Cholecalciferol (VITAMIN D PO)       fluCONazole (DIFLUCAN) 150 MG tablet Take 1 tablet (150 mg) by mouth daily for 7 days. 7 tablet 0    fluticasone-salmeterol (ADVAIR) 100-50 MCG/DOSE inhaler Inhale 1 puff by mouth every 12 hours.      hydrochlorothiazide (HYDRODIURIL) 50 MG tablet Take 50 mg by mouth daily.      HYDROcodone-acetaminophen (NORCO) 5-325 MG tablet Take 1 tablet by mouth every 6 hours as needed for Moderate Pain (Pain Score 4-6). 40 tablet 0    levalbuterol (XOPENEX) 45 MCG/ACT inhaler Inhale 2 puffs by mouth every 6 hours as needed for Wheezing.      LEVOTHYROXINE SODIUM PO Take 50 mcg by mouth daily.        METFORMIN HCL PO Take 200 mg by mouth daily.      Multiple Vitamins-Minerals (ONE DAILY CALCIUM/IRON PO)       oxybutynin (DITROPAN) 5 MG tablet Take 1 tablet (5 mg) by mouth 3 times daily. Start taking after surgery for urinary symptoms 21  tablet 1    phenazopyridine (PYRIDIUM) 100 MG tablet Take 2 tablets (200 mg) by mouth 3 times daily as needed for Mild Pain (Pain Score 1-3) or Moderate Pain (Pain Score 4-6) (Bladder pain or dysuria) for up to 14 days. 42 tablet 0    tamsulosin (FLOMAX) 0.4 MG capsule Take 1 capsule (0.4 mg) by mouth daily (with food). 30 capsule 0    traMADol (ULTRAM) 50 MG tablet Take 1 tablet (50 mg) by mouth every 6 hours as needed for Moderate Pain (Pain Score 4-6). 15 tablet 0     No current facility-administered medications for this visit.      Past Medical History  Past Medical History:   Diagnosis Date    Asthma     HTN (hypertension)     Kidney disease, medullary sponge     Trigeminal neuralgia      Patient Active Problem List   Diagnosis     Nephrolithiasis    ESBL (extended spectrum beta-lactamase) producing bacteria infection    UTI (urinary tract infection)       Past Surgical History  Past Surgical History:   Procedure Laterality Date    ANTERIOR CRUCIATE LIGAMENT REPAIR      appendix      HYSTERECTOMY      OSTEOTOMY      SHOULDER SURGERY       Family History  Family History   Problem Relation Age of Onset    Cancer Father     Heart Disease Father     Hypertension Father     Cancer Maternal Grandfather     Cancer Paternal Grandmother     Stroke Paternal Grandmother     Cancer Paternal Grandfather     Diabetes Paternal Grandfather      Social History  Social History     Social History    Marital status: Married     Spouse name: N/A    Number of children: N/A    Years of education: N/A     Social History Main Topics    Smoking status: Never Smoker    Smokeless tobacco: Never Used    Alcohol use No    Drug use: No      Comment: denies    Sexual activity: Not on file     Social Activities of Daily Living Present    Not on file     Social History Narrative       ROS See pertinent positives and negatives in HPI. All other systems reviewed and found negative.     Objective  Physical Exam  Vitals:    07/31/16 1410   BP: 149/87   BP Location: Left arm   BP Patient Position: Sitting   BP cuff size: Regular   Pulse: 89   Resp: 17   Temp: 97.7 F (36.5 C)   TempSrc: Oral   Weight: 81.2 kg (179 lb)   Height: 5\' 4"  (1.626 m)     GENERAL: The patient is an alert, cooperative female in no acute distress.   HEENT: normalocephalic/atraumatic  NECK: midline trachea. No jugular venous distention    EXTREMITIES: Joints are normal without redness or swelling.   SKIN: There is no edema or cyanosis.   NEURO: no motor or sensory deficits.  Alert and Oriented x 3    Lab Review: The following lab results were personally reviewed by me:  BMP Results Latest Ref Rng & Units 07/19/2016 07/18/2016 07/17/2016   GLU 70 -  99 mg/dL 103(H) 100(H) 100(H)   BUN 6 -  20 mg/dL 21(H) 16 19   CREAT 0.51 - 0.95 mg/dL 0.85 0.81 0.69   GFRNON mL/min >60 >60 >60   NA 136 - 145 mmol/L 143 143 144   K 3.5 - 5.1 mmol/L 3.7 4.1 4.1   CL 98 - 107 mmol/L 102 106 104   BICARB 22 - 29 mmol/L 29 27 25    ANION 7 - 15 mmol/L 12 10 15    [place] 8.5 - 10.6 mg/dL 9.0 8.5 9.1     CBC Results Latest Ref Rng & Units 07/19/2016 07/18/2016 07/17/2016   WBC 4.0 - 10.0 1000/mm3 5.8 6.2 6.5   RBC 3.90 - 5.20 mill/mm3 4.10 3.73(L) 3.95   HGB 11.2 - 15.7 gm/dL 13.6 12.4 13.1   HCT 34.0 - 45.0 % 40.1 37.0 38.7   PLT 140 - 370 1000/mm3 224 216 237   No flowsheet data found.    24-hour urine collection - this was personally reviewed and results were discussed with the patient. It demonstrates the following:    Litholink Results Latest Ref Rng & Units 05/12/2016 05/12/2016 11/20/2014   VOL 0.5 - 5 Liters/Day 1.5 1.5 0.9   SS CAOX 6 - 10 12.5(A) 12.5(A) 14.7(A)   [place] 0 - 200 mg/day 175 175 236(A)   OXALATE 20 - 40 mg/day 61.0(A) 61.0(A) 32.0   CITRATE 550 mg/day 753 753 456(A)   SS CaP 0.5 - 2 0.6 0.6 1.2   pH 5.8 - 6.2 5.7(A) 5.7(A) 5.6(A)   SS URIC 0 - 1 1 1  2(A)   URIC ACID CONC 0 - 0.750 g/day 0.473 0.473 0.551   NA 50 - 150 mmol/day 168(A) 168(A) 126   K 20 - 100 mmol/day 74 74 46   MG 30 - 120 mg/day 110 110 85   PHOS 0.6 - 1.2 g/day 0.7 0.7 0.8   NH4 15 - 60 mmol/day 29 29 30    CHOL 70 - 250 mmol/day 205 205 126   SULF 20 - 80 mEq/day 23 23 38   UREA 6 - 14 g/day 6.7 6.7 10.3   PCR 0.8 - 1.4 g/kg/day 0.7(A) 0.7(A) 1.1   CREAT mg/day 1093.0 1093.0 1289.0   CR/KG 15 - 20 mg/kg/day 14.2(A) 14.2(A) 18.3   [place]/KG 0 - 4.00 mg/kg/day 2.30 2.30 3.40   [place]/CR 0 - 140 mg/g 160(A) 160(A) 183(A)       Imaging  The following imaging was personally reviewed and the findings were discussed with the patient.    Stone and Imaging 07/31/2016   [facility] Flow[facility]   Imaging Date 07/17/2016   Image Type CT abdomen/pelvis   Was there an increase in stone burden compared to previous imaging? No   Collecting System Right   Number of stones on  imaging 0   Size of stone burden on imaging (mm) -   Stone location on imaging; choose all that apply -   Severity of hydronephrosis -   Choose the most appropriate description -   Size of stone burden on imaging (mm) 3   Number of stones on imaging 1   Stone location on imaging; choose all that apply distal ureter   Severity of hydronephrosis mild   Choose the most appropriate description Ureteral stone only       Sherrian Divers, MD

## 2016-08-04 ENCOUNTER — Other Ambulatory Visit (INDEPENDENT_AMBULATORY_CARE_PROVIDER_SITE_OTHER): Payer: Self-pay | Admitting: Family Medicine

## 2016-08-04 DIAGNOSIS — N959 Unspecified menopausal and perimenopausal disorder: Principal | ICD-10-CM

## 2016-08-04 MED ORDER — VIVELLE-DOT 0.05 MG/24HR TD PTTW
MEDICATED_PATCH | TRANSDERMAL | 0 refills | Status: DC
Start: 2016-08-04 — End: 2016-12-15

## 2016-08-04 NOTE — Telephone Encounter (Signed)
 Left message

## 2016-08-07 ENCOUNTER — Ambulatory Visit (HOSPITAL_BASED_OUTPATIENT_CLINIC_OR_DEPARTMENT_OTHER): Payer: BC Managed Care – PPO | Admitting: Urology

## 2016-08-11 ENCOUNTER — Telehealth (HOSPITAL_BASED_OUTPATIENT_CLINIC_OR_DEPARTMENT_OTHER): Payer: Self-pay | Admitting: Urology

## 2016-08-11 NOTE — Telephone Encounter (Signed)
Need to cancel 08/21/16 appt with provider Sur.  Appt cancelled for now and rescheduled to 08/28/16 @ 4:15PM  Message left for pt to call back to confirm new appt.  CALL CENTER: Please confirm date and time and document this has been done.

## 2016-08-14 ENCOUNTER — Other Ambulatory Visit (HOSPITAL_BASED_OUTPATIENT_CLINIC_OR_DEPARTMENT_OTHER): Payer: Self-pay | Admitting: Urology

## 2016-08-14 DIAGNOSIS — N10 Acute tubulo-interstitial nephritis: Principal | ICD-10-CM

## 2016-08-14 NOTE — Telephone Encounter (Signed)
Medication requested:   Requested Prescriptions     Pending Prescriptions Disp Refills    oxybutynin (DITROPAN) 5 MG tablet 21 tablet 1     Sig: Take 1 tablet (5 mg) by mouth 3 times daily. Start taking after surgery for urinary symptoms       Last OV (provider):      07/31/2016  Next OV (provider):      08/28/2016  Date of last refill:  07/10/16

## 2016-08-14 NOTE — Telephone Encounter (Signed)
Spoke with pt and advised Melanie Walsh is not going to be in clinic on, 08/28/16. Pt verbalized understanding. Closing encounter.

## 2016-08-14 NOTE — Telephone Encounter (Signed)
Pt calling to know if both appt for 5/4 and 5/11 be scheduled on the same day of not she is able to keep that appt she does not want the appt to go past 5/11.     Pt may be reached at [phone], thank you.

## 2016-08-15 ENCOUNTER — Other Ambulatory Visit (INDEPENDENT_AMBULATORY_CARE_PROVIDER_SITE_OTHER): Payer: Self-pay | Admitting: Family Medicine

## 2016-08-15 DIAGNOSIS — I10 Essential (primary) hypertension: Principal | ICD-10-CM

## 2016-08-15 DIAGNOSIS — E039 Hypothyroidism, unspecified: Secondary | ICD-10-CM

## 2016-08-17 MED ORDER — HYDROCHLOROTHIAZIDE 25 MG OR TABS
ORAL_TABLET | ORAL | 0 refills | Status: DC
Start: 2016-08-17 — End: 2016-10-26

## 2016-08-17 MED ORDER — LEVOTHYROXINE SODIUM 50 MCG OR TABS
ORAL_TABLET | ORAL | 0 refills | Status: DC
Start: 2016-08-17 — End: 2016-11-25

## 2016-08-21 ENCOUNTER — Encounter (HOSPITAL_BASED_OUTPATIENT_CLINIC_OR_DEPARTMENT_OTHER): Payer: BC Managed Care – PPO | Admitting: Urology

## 2016-08-21 ENCOUNTER — Ambulatory Visit: Payer: BC Managed Care – PPO | Attending: Registered" | Admitting: Registered"

## 2016-08-21 DIAGNOSIS — N200 Calculus of kidney: Principal | ICD-10-CM | POA: Insufficient documentation

## 2016-08-21 NOTE — Interdisciplinary (Signed)
Nutritional Assessment

## 2016-08-21 NOTE — Progress Notes (Signed)
S/O: 56 year old female with recurrent nephrolithiasis due to low urine volume and hyperoxaluria, presents today to discuss latest urine results and nutritional modifications for kidney stone prevention.   Pt presents with Rt pyelonephritis, refractory ESBL, recent abx therapy with ongoing diarrhea, started on Probiotics x last month    HT: 162cm, WT: 153cm, BMI: 30.7  Diet: Regular diet with low CHO and protein intake, home cooked meals, eating out <50%  Adequate hydration: water  Decreased Sodium diet: avoids eating out and processed foods consumption  Decreased Oxalate intake: avoids high oxalate foods including spinach, nuts, strawberries, potatoes  Decreased animal protein intake: eating plant based diet     Medications/Supplements: Vit D3 5000 UI day    Labs:   24-hour urine collection   Litholink Results Latest Ref Rng & Units 05/12/2016 05/12/2016 11/20/2014   VOL 0.5 - 5 Liters/Day 1.5 1.5 0.9   SS CAOX 6 - 10 12.5(A) 12.5(A) 14.7(A)   [place] 0 - 200 mg/day 175 175 236(A)   OXALATE 20 - 40 mg/day 61.0(A) 61.0(A) 32.0   CITRATE 550 mg/day 753 753 456(A)   SS CaP 0.5 - 2 0.6 0.6 1.2   pH 5.8 - 6.2 5.7(A) 5.7(A) 5.6(A)   SS URIC 0 - 1 1 1  2(A)   URIC ACID CONC 0 - 0.750 g/day 0.473 0.473 0.551   NA 50 - 150 mmol/day 168(A) 168(A) 126   K 20 - 100 mmol/day 74 74 46   MG 30 - 120 mg/day 110 110 85   PHOS 0.6 - 1.2 g/day 0.7 0.7 0.8   NH4 15 - 60 mmol/day 29 29 30    CHOL 70 - 250 mmol/day 205 205 126   SULF 20 - 80 mEq/day 23 23 38   UREA 6 - 14 g/day 6.7 6.7 10.3   PCR 0.8 - 1.4 g/kg/day 0.7(A) 0.7(A) 1.1   CREAT mg/day 1093.0 1093.0 1289.0   CR/KG 15 - 20 mg/kg/day 14.2(A) 14.2(A) 18.3   [place]/KG 0 - 4.00 mg/kg/day 2.30 2.30 3.40   [place]/CR 0 - 140 mg/g 160(A) 160(A) 183(A)       A/P: Food and Nutritional-related knowledge deficit related to incomplete and inaccurate knowledge about foods, as evidenced by current food choices.   1. Adequate hydration, goal >2.5L/day, Lemonade options discussed  2. Continue Low Sodium  diet, goal 2400mg  /day, guidelines reviewed, detailed handout provided  3. Continue Moderate Oxalate intake, detailed guidelines reviewed, continue probiotics with meals and yogurt daily, avoid Vit C supplementation  4. Continue Vegetarian days during the week  5. Hypovitaminosis Vit D3: continue Vit D 3 supplementation, will recheck 25OH vit D levels

## 2016-08-22 MED ORDER — OXYBUTYNIN CHLORIDE 5 MG OR TABS
5.0000 mg | ORAL_TABLET | Freq: Three times a day (TID) | ORAL | 1 refills | Status: DC
Start: 2016-08-22 — End: 2016-09-08

## 2016-08-28 ENCOUNTER — Ambulatory Visit (HOSPITAL_BASED_OUTPATIENT_CLINIC_OR_DEPARTMENT_OTHER): Payer: BC Managed Care – PPO | Admitting: Urology

## 2016-09-07 ENCOUNTER — Other Ambulatory Visit (INDEPENDENT_AMBULATORY_CARE_PROVIDER_SITE_OTHER): Payer: Self-pay | Admitting: Family Medicine

## 2016-09-07 DIAGNOSIS — G500 Trigeminal neuralgia: Principal | ICD-10-CM

## 2016-09-07 MED ORDER — CARBAMAZEPINE 200 MG OR TABS
ORAL_TABLET | ORAL | 0 refills | Status: DC
Start: 2016-09-07 — End: 2016-10-05

## 2016-09-08 ENCOUNTER — Other Ambulatory Visit (HOSPITAL_BASED_OUTPATIENT_CLINIC_OR_DEPARTMENT_OTHER): Payer: Self-pay | Admitting: Urology

## 2016-09-08 DIAGNOSIS — N10 Acute tubulo-interstitial nephritis: Principal | ICD-10-CM

## 2016-09-08 MED ORDER — OXYBUTYNIN CHLORIDE 5 MG OR TABS
5.0000 mg | ORAL_TABLET | Freq: Three times a day (TID) | ORAL | 1 refills | Status: DC
Start: 2016-09-08 — End: 2016-10-26

## 2016-09-18 ENCOUNTER — Other Ambulatory Visit (INDEPENDENT_AMBULATORY_CARE_PROVIDER_SITE_OTHER): Payer: Self-pay | Admitting: Family Medicine

## 2016-09-18 DIAGNOSIS — Z5181 Encounter for therapeutic drug level monitoring: Principal | ICD-10-CM

## 2016-09-18 MED ORDER — METFORMIN HCL 500 MG OR TB24
ORAL_TABLET | ORAL | 0 refills | Status: DC
Start: 2016-09-18 — End: 2016-10-19

## 2016-09-18 MED ORDER — AMLODIPINE 10 MG OR TABS
ORAL_TABLET | ORAL | 0 refills | Status: DC
Start: 2016-09-18 — End: 2016-10-26

## 2016-09-22 ENCOUNTER — Telehealth (HOSPITAL_BASED_OUTPATIENT_CLINIC_OR_DEPARTMENT_OTHER): Payer: Self-pay | Admitting: Urology

## 2016-09-22 NOTE — Telephone Encounter (Signed)
Called patient, confirmed 2 identifiers. Called to remind patient that prior to her appt this Friday (6/8) 3:45pm, she needs to do transvaginal US and KUB. Pt states that she is not comfortable doing a regular transvaginal US. She is fine with KUB. Pt also states that she would rather do CT scan and if that's possible, prefers CT to be done near her home in [place]. ([facility]).

## 2016-09-24 ENCOUNTER — Telehealth (HOSPITAL_BASED_OUTPATIENT_CLINIC_OR_DEPARTMENT_OTHER): Payer: Self-pay | Admitting: Urology

## 2016-09-24 DIAGNOSIS — N200 Calculus of kidney: Principal | ICD-10-CM

## 2016-09-24 NOTE — Telephone Encounter (Addendum)
Pt is calling to cancel appt for tomorrow 6/8 and to f/u on previous communication w/Dr Reather Converse and staff.    Pt reiterates that she cancelled her appt due to not having transvaginal ultrasound Dr Reather Converse ordered done yet.    Pt states she is not comfortable w/this ultrasound and asks if Dr Reather Converse would consider her having a CT Scan instead?    Please advise  [phone]

## 2016-09-24 NOTE — Telephone Encounter (Signed)
Routed to Dr. Sur for review.

## 2016-09-25 ENCOUNTER — Ambulatory Visit (HOSPITAL_BASED_OUTPATIENT_CLINIC_OR_DEPARTMENT_OTHER): Payer: BC Managed Care – PPO | Admitting: Urology

## 2016-09-25 NOTE — Telephone Encounter (Signed)
CT ordered    pls forward questions re my patient care to the fellow, Dr. Mickel Fuchs, in the future. He will cc me if there are questions.

## 2016-10-05 ENCOUNTER — Other Ambulatory Visit (INDEPENDENT_AMBULATORY_CARE_PROVIDER_SITE_OTHER): Payer: Self-pay | Admitting: Family Medicine

## 2016-10-05 DIAGNOSIS — Z5181 Encounter for therapeutic drug level monitoring: Principal | ICD-10-CM

## 2016-10-05 MED ORDER — CARBAMAZEPINE 200 MG OR TABS
ORAL_TABLET | ORAL | 0 refills | Status: DC
Start: 2016-10-05 — End: 2016-10-26

## 2016-10-06 ENCOUNTER — Encounter (INDEPENDENT_AMBULATORY_CARE_PROVIDER_SITE_OTHER): Payer: Self-pay | Admitting: Family

## 2016-10-06 ENCOUNTER — Ambulatory Visit (INDEPENDENT_AMBULATORY_CARE_PROVIDER_SITE_OTHER): Admitting: Family

## 2016-10-06 VITALS — BP 144/86 | HR 98 | Temp 98.4°F | Resp 16 | Ht 63.0 in | Wt 178.8 lb

## 2016-10-06 DIAGNOSIS — Z0000 Encounter for general adult medical examination without abnormal findings: Principal | ICD-10-CM

## 2016-10-06 DIAGNOSIS — E039 Hypothyroidism, unspecified: Secondary | ICD-10-CM

## 2016-10-06 DIAGNOSIS — G500 Trigeminal neuralgia: Secondary | ICD-10-CM

## 2016-10-06 DIAGNOSIS — I10 Essential (primary) hypertension: Secondary | ICD-10-CM

## 2016-10-07 ENCOUNTER — Telehealth (HOSPITAL_BASED_OUTPATIENT_CLINIC_OR_DEPARTMENT_OTHER): Payer: Self-pay | Admitting: Registered"

## 2016-10-07 DIAGNOSIS — N200 Calculus of kidney: Principal | ICD-10-CM

## 2016-10-07 NOTE — Telephone Encounter (Signed)
Routing to MD to advise, there is nothing noted in the last note about pt needed a litholink.

## 2016-10-07 NOTE — Telephone Encounter (Signed)
Pt has appt 11/20/16 and wants clinic to know she has not received her Litholink via mail yet.  Please follow up and contact patient at [phone].

## 2016-10-08 ENCOUNTER — Telehealth (HOSPITAL_BASED_OUTPATIENT_CLINIC_OR_DEPARTMENT_OTHER): Payer: Self-pay | Admitting: Urology

## 2016-10-08 NOTE — Telephone Encounter (Signed)
Spoke to pt about not needing a litholink, pt stated this did not make any sense due to the low oxalate diet. Per pt she was supposed to do the diet for 3 months and recheck her urine because the diet upsets her stomach. She stated Jiles Garter wanted her to do the diet and recheck urine. Pt aware message is being sent to doctor to advise as there is nothing noted in the last note from [facility]

## 2016-10-08 NOTE — Telephone Encounter (Signed)
Called patient to let her know that request for Litholink was faxed to [facility], that she is going to receive the kit in 2 weeks and to call me when she receive the kit to check on authorization. Dr [name] Pronto sent a msg that he place the order for [place]. Please transfer patient to [facility] when patient calls.

## 2016-10-12 ENCOUNTER — Ambulatory Visit (INDEPENDENT_AMBULATORY_CARE_PROVIDER_SITE_OTHER): Admitting: Family

## 2016-10-18 ENCOUNTER — Other Ambulatory Visit (INDEPENDENT_AMBULATORY_CARE_PROVIDER_SITE_OTHER): Payer: Self-pay | Admitting: Family Medicine

## 2016-10-18 DIAGNOSIS — E1169 Type 2 diabetes mellitus with other specified complication: Principal | ICD-10-CM

## 2016-10-19 MED ORDER — METFORMIN HCL 500 MG OR TB24
ORAL_TABLET | ORAL | 0 refills | Status: DC
Start: 2016-10-19 — End: 2016-11-25

## 2016-10-26 ENCOUNTER — Encounter (INDEPENDENT_AMBULATORY_CARE_PROVIDER_SITE_OTHER): Payer: Self-pay | Admitting: Internal Medicine

## 2016-10-26 ENCOUNTER — Ambulatory Visit (INDEPENDENT_AMBULATORY_CARE_PROVIDER_SITE_OTHER): Payer: BC Managed Care – PPO | Admitting: Internal Medicine

## 2016-10-26 VITALS — BP 137/99 | HR 103 | Temp 98.3°F | Ht 63.0 in | Wt 174.0 lb

## 2016-10-26 DIAGNOSIS — L989 Disorder of the skin and subcutaneous tissue, unspecified: Secondary | ICD-10-CM

## 2016-10-26 DIAGNOSIS — J4520 Mild intermittent asthma, uncomplicated: Secondary | ICD-10-CM

## 2016-10-26 DIAGNOSIS — Q615 Medullary cystic kidney: Secondary | ICD-10-CM

## 2016-10-26 DIAGNOSIS — N390 Urinary tract infection, site not specified: Secondary | ICD-10-CM

## 2016-10-26 DIAGNOSIS — Z780 Asymptomatic menopausal state: Secondary | ICD-10-CM

## 2016-10-26 DIAGNOSIS — Z5181 Encounter for therapeutic drug level monitoring: Secondary | ICD-10-CM

## 2016-10-26 DIAGNOSIS — Z0189 Encounter for other specified special examinations: Secondary | ICD-10-CM

## 2016-10-26 DIAGNOSIS — Z1231 Encounter for screening mammogram for malignant neoplasm of breast: Secondary | ICD-10-CM

## 2016-10-26 DIAGNOSIS — Z1211 Encounter for screening for malignant neoplasm of colon: Secondary | ICD-10-CM

## 2016-10-26 DIAGNOSIS — G500 Trigeminal neuralgia: Secondary | ICD-10-CM

## 2016-10-26 DIAGNOSIS — I10 Essential (primary) hypertension: Principal | ICD-10-CM

## 2016-10-26 DIAGNOSIS — E039 Hypothyroidism, unspecified: Secondary | ICD-10-CM

## 2016-10-26 MED ORDER — ESTRADIOL 0.05 MG/24HR TD PTTW
MEDICATED_PATCH | TRANSDERMAL | Status: DC
Start: ? — End: 2016-11-20

## 2016-10-26 MED ORDER — CARBAMAZEPINE 200 MG OR TABS
200.00 mg | ORAL_TABLET | Freq: Every day | ORAL | Status: DC
Start: 2016-10-26 — End: 2016-11-25

## 2016-10-26 MED ORDER — LEVALBUTEROL TARTRATE 45 MCG/ACT IN AERO
INHALATION_SPRAY | RESPIRATORY_TRACT | Status: DC
Start: ? — End: 2017-04-21

## 2016-10-28 ENCOUNTER — Telehealth (INDEPENDENT_AMBULATORY_CARE_PROVIDER_SITE_OTHER): Payer: Self-pay | Admitting: Internal Medicine
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
## 2016-10-28 NOTE — Telephone Encounter (Signed)
Opened in error

## 2016-10-29 ENCOUNTER — Telehealth (INDEPENDENT_AMBULATORY_CARE_PROVIDER_SITE_OTHER): Payer: Self-pay

## 2016-10-29 NOTE — Telephone Encounter (Signed)
Transfer History   From [facility]: GI NURSE [facility] To [facility]: GI [place]   User: Wende Bushy Date/Time: Tue Oct 27, 2016 2:32 PM   Transfer Comments: any gen gi lj/hc for screening colon with mod sed     1st attempt, patient requesting callback beginning of August to assist with scheduling. Patient advise smaller prep might have to pay out of pocket.

## 2016-11-01 ENCOUNTER — Encounter (INDEPENDENT_AMBULATORY_CARE_PROVIDER_SITE_OTHER): Payer: Self-pay | Admitting: Internal Medicine

## 2016-11-01 DIAGNOSIS — N390 Urinary tract infection, site not specified: Secondary | ICD-10-CM | POA: Insufficient documentation

## 2016-11-01 DIAGNOSIS — I10 Essential (primary) hypertension: Secondary | ICD-10-CM | POA: Insufficient documentation

## 2016-11-01 DIAGNOSIS — Q615 Medullary cystic kidney: Secondary | ICD-10-CM | POA: Insufficient documentation

## 2016-11-01 DIAGNOSIS — E039 Hypothyroidism, unspecified: Secondary | ICD-10-CM | POA: Insufficient documentation

## 2016-11-01 DIAGNOSIS — G500 Trigeminal neuralgia: Secondary | ICD-10-CM

## 2016-11-08 ENCOUNTER — Other Ambulatory Visit (INDEPENDENT_AMBULATORY_CARE_PROVIDER_SITE_OTHER): Payer: Self-pay | Admitting: Family Medicine

## 2016-11-08 DIAGNOSIS — Z5181 Encounter for therapeutic drug level monitoring: Principal | ICD-10-CM

## 2016-11-09 MED ORDER — CARBAMAZEPINE 200 MG OR TABS
ORAL_TABLET | ORAL | 0 refills | Status: DC
Start: 2016-11-09 — End: 2016-11-26

## 2016-11-15 ENCOUNTER — Other Ambulatory Visit (INDEPENDENT_AMBULATORY_CARE_PROVIDER_SITE_OTHER): Payer: Self-pay | Admitting: Family Medicine

## 2016-11-16 ENCOUNTER — Telehealth (INDEPENDENT_AMBULATORY_CARE_PROVIDER_SITE_OTHER): Payer: Self-pay | Admitting: Internal Medicine

## 2016-11-16 NOTE — Telephone Encounter (Signed)
Pt states that she requested lab orders to be sent to a lab in [place] and it has not arrived there yet.   [facility]   [place] fax: [phone]  Pt states that she needs this orders to be sent this afternoon so she can do her fasting labs tomorrow morning, on time before her apt with PCP on Friday.please advise

## 2016-11-16 NOTE — Telephone Encounter (Signed)
Orders faxed to [place] location below.

## 2016-11-16 NOTE — Telephone Encounter (Signed)
Spoke to pt re: sent orders for [facility], pt was appreciative.  Wants MD aware that she was out of town and was unable to complete the mammogram, but will schedule for it soon.

## 2016-11-16 NOTE — Telephone Encounter (Signed)
Called [facility] and confirm orders received.

## 2016-11-16 NOTE — Telephone Encounter (Signed)
Patient returning LVN call back. Per epic no annotation of call out to patient. Per patient it was moments ago and specifically to call back to speak with Dominica.    Spoke with LVN, Lorriane Shire. Transferred call to LVN.

## 2016-11-16 NOTE — Telephone Encounter (Signed)
Attempted to call [facility] to confirm orders received, however line rang and went to busy tone.    Left message for pt to return call.

## 2016-11-19 ENCOUNTER — Encounter (INDEPENDENT_AMBULATORY_CARE_PROVIDER_SITE_OTHER): Payer: Self-pay | Admitting: Internal Medicine

## 2016-11-19 ENCOUNTER — Telehealth (INDEPENDENT_AMBULATORY_CARE_PROVIDER_SITE_OTHER): Payer: Self-pay | Admitting: Internal Medicine

## 2016-11-19 DIAGNOSIS — Z0000 Encounter for general adult medical examination without abnormal findings: Principal | ICD-10-CM

## 2016-11-19 NOTE — Telephone Encounter (Signed)
Received outside results from [facility] via fax.  Placed in MD inbox for review.    Future Appointments  Date Time Provider [place]   11/20/2016 11:30 AM Deconde, Haynes Dage, MD [facility] Int Med [place]   11/20/2016 2:00 PM Delmar Landau, RD [facility]

## 2016-11-20 ENCOUNTER — Telehealth (HOSPITAL_BASED_OUTPATIENT_CLINIC_OR_DEPARTMENT_OTHER): Payer: Self-pay | Admitting: Urology

## 2016-11-20 ENCOUNTER — Ambulatory Visit: Payer: BC Managed Care – PPO | Attending: Urology | Admitting: Registered"

## 2016-11-20 ENCOUNTER — Ambulatory Visit (INDEPENDENT_AMBULATORY_CARE_PROVIDER_SITE_OTHER): Payer: BC Managed Care – PPO | Admitting: Internal Medicine

## 2016-11-20 VITALS — BP 154/65 | Temp 98.0°F | Ht 63.5 in | Wt 174.0 lb

## 2016-11-20 DIAGNOSIS — N200 Calculus of kidney: Principal | ICD-10-CM | POA: Insufficient documentation

## 2016-11-20 DIAGNOSIS — Z0000 Encounter for general adult medical examination without abnormal findings: Principal | ICD-10-CM

## 2016-11-20 DIAGNOSIS — H532 Diplopia: Secondary | ICD-10-CM

## 2016-11-20 DIAGNOSIS — I10 Essential (primary) hypertension: Secondary | ICD-10-CM

## 2016-11-20 DIAGNOSIS — N201 Calculus of ureter: Secondary | ICD-10-CM | POA: Insufficient documentation

## 2016-11-20 DIAGNOSIS — E039 Hypothyroidism, unspecified: Secondary | ICD-10-CM

## 2016-11-20 MED ORDER — HYDROCHLOROTHIAZIDE 25 MG OR TABS
25.00 mg | ORAL_TABLET | Freq: Every day | ORAL | 0 refills | Status: DC
Start: 2016-11-20 — End: 2016-11-20

## 2016-11-20 MED ORDER — LOSARTAN POTASSIUM-HCTZ 100-25 MG OR TABS
1.0000 | ORAL_TABLET | Freq: Every day | ORAL | 1 refills | Status: DC
Start: 2016-11-20 — End: 2016-12-23

## 2016-11-20 MED ORDER — HYDROCODONE-ACETAMINOPHEN 5-325 MG OR TABS
1.0000 | ORAL_TABLET | Freq: Four times a day (QID) | ORAL | 0 refills | Status: DC | PRN
Start: 2016-11-20 — End: 2017-02-10

## 2016-11-20 NOTE — Interdisciplinary (Signed)
Nutritional Follow up
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
## 2016-11-20 NOTE — Patient Instructions (Addendum)
Stop hydrochlorothiazide and begin losartan-hctz. We will recheck your labs in 1 week.

## 2016-11-20 NOTE — Progress Notes (Signed)
S/O: 56 year old female with recurrent nephrolithiasis due to low urine volume and hyperoxaluria, presents today to discuss latest urine results and nutritional modifications for kidney stone prevention.   Pt presents with Rt pyelonephritis, refractory ESBL, recent abx therapy with ongoing diarrhea, started on Probiotics x last month.   Pt complains of lower back pain x last 2 days r/t possible kidney stone (requesting Flomax and Norco, addressed by Dr.Sur)    HT: 162cm, WT: 153cm, BMI: 30.7  Diet: Regular diet with low CHO and protein intake, home cooked meals, eating out <50%  Adequate hydration: water and lemonade  Decreased Sodium diet: avoids eating out and processed foods consumption  Decreased Oxalate intake: avoids high oxalate foods including spinach, nuts, strawberries, potatoes  Decreased animal protein intake: eating plant based diet     Medications/Supplements: Vit D3 5000 UI day    Labs: 11/02/16: UV 17, [place] 210, Oxalate 32 (61), Citrate 939, ph 6.4, UA 750, Na 183    A/P: Food and Nutritional-related knowledge deficit related to incomplete and inaccurate knowledge about foods, as evidenced by current food choices.   1. Adequate hydration, goal >2.5L/day, Lemonade options discussed  2. Continue Low Sodium diet, goal 2400mg  /day, guidelines reviewed, detailed handout provided  3. Continue Moderate Oxalate intake, detailed guidelines reviewed, continue probiotics with meals and yogurt daily, avoid Vit C supplementation  4. Continue Vegetarian days during the week  5. Hypovitaminosis Vit D3: continue Vit D 3 supplementation, will recheck 25OH vit D levels

## 2016-11-20 NOTE — Telephone Encounter (Signed)
Renal colic

## 2016-11-23 ENCOUNTER — Other Ambulatory Visit (INDEPENDENT_AMBULATORY_CARE_PROVIDER_SITE_OTHER): Payer: Self-pay | Admitting: Family Medicine

## 2016-11-23 DIAGNOSIS — Z5181 Encounter for therapeutic drug level monitoring: Secondary | ICD-10-CM

## 2016-11-23 DIAGNOSIS — E039 Hypothyroidism, unspecified: Principal | ICD-10-CM

## 2016-11-23 DIAGNOSIS — E1169 Type 2 diabetes mellitus with other specified complication: Secondary | ICD-10-CM

## 2016-11-25 ENCOUNTER — Other Ambulatory Visit (INDEPENDENT_AMBULATORY_CARE_PROVIDER_SITE_OTHER): Payer: Self-pay | Admitting: Internal Medicine

## 2016-11-25 DIAGNOSIS — Z5181 Encounter for therapeutic drug level monitoring: Principal | ICD-10-CM

## 2016-11-25 DIAGNOSIS — E1169 Type 2 diabetes mellitus with other specified complication: Secondary | ICD-10-CM

## 2016-11-25 DIAGNOSIS — E039 Hypothyroidism, unspecified: Secondary | ICD-10-CM

## 2016-11-25 NOTE — Telephone Encounter (Signed)
Pt will be out of medication by Saturday. Last prescribing doctor is Dr. Rosita Fire who is pt's old PCP.

## 2016-11-25 NOTE — Telephone Encounter (Signed)
Spoke to pt to clarify frequency and dosage for the following medications:  Tegretol 200mg ; Once daily at evening;   levothyroxine 67mcg once daily  metformin 500mg  xr x2 tabs daily (since insurance doesn't cover 1000mg  tabs)    Pt is requesting Dr. Jannifer Franklin to take over since she is new Primary MD.

## 2016-11-25 NOTE — Telephone Encounter (Signed)
Prescription Refill Request     Incoming call from patient requesting refill   Name of PCP Provider: Berta Minor   Last office visit: 11/20/2016  Next office visit: no appt    Allergies   Allergen Reactions    Adhesive Tape Rash    Latex Rash    Morphine Shortness of Breath and Swelling    Nickel Rash     Swelling and rash    Petroleum Jelly [Petrolatum] Rash    Sulfa Drugs Rash     Related topical skin cream    Dilaudid  [Hydromorphone Hcl] Unspecified    Erythromycin Nausea Only    Oxycodone-Aspirin Unspecified       Requested Medication/s:   Current Outpatient Prescriptions   Medication Sig Dispense Refill    carBAMazepine (TEGRETOL) 200 MG tablet Take 1 tablet (200 mg) by mouth daily.      levothyroxine (SYNTHROID) 50 MCG tablet TAKE 1 TABLET BY MOUTH DAILY 90 tablet 0    metFORMIN (GLUCOPHAGE XR) 500 MG XR tablet TAKE 2 TABLETS BY MOUTH EVERY DAY 60 tablet 0     No current facility-administered medications for this visit.          Patient has made an attempt to refill with pharmacy? NO    Send to:     [facility] [place] - [address] AT [address]  [place]  [place] [address]  Phone: [phone] Fax: [phone]      Has been advised this message will be transmitted to office and if any issues can expect a response within the next 24-72 hours, otherwise pharmacy should be contacting patient when refills are ready.    [facility]; routing encounter to LVN

## 2016-11-26 ENCOUNTER — Encounter (INDEPENDENT_AMBULATORY_CARE_PROVIDER_SITE_OTHER): Payer: Self-pay | Admitting: Internal Medicine

## 2016-11-26 DIAGNOSIS — Z13820 Encounter for screening for osteoporosis: Principal | ICD-10-CM

## 2016-11-26 MED ORDER — LEVOTHYROXINE SODIUM 50 MCG OR TABS
50.0000 ug | ORAL_TABLET | Freq: Every day | ORAL | 1 refills | Status: DC
Start: 2016-11-26 — End: 2017-05-25

## 2016-11-26 MED ORDER — CARBAMAZEPINE 200 MG OR TABS
200.0000 mg | ORAL_TABLET | Freq: Every day | ORAL | 1 refills | Status: AC
Start: 2016-11-26 — End: ?

## 2016-11-26 MED ORDER — METFORMIN HCL 500 MG OR TB24
1000.0000 mg | ORAL_TABLET | Freq: Every day | ORAL | 1 refills | Status: DC
Start: 2016-11-26 — End: 2017-05-25

## 2016-11-26 NOTE — Telephone Encounter (Signed)
From: Melanie Walsh  To: Berta Minor, MD  Sent: 11/26/2016 12:02 PM PDT  Subject: 2-Procedural Question    Good afternoon,    I just received the following message through my patient portal. I have the Mammogram scheduled already. I did call radiology to schedule the bone scan density test but they do not have a record of receiving the order so suggested I contact your office. Please advise what is needed by me for scheduling this test.    Thank you    Melanie Walsh ( please see below for copy of message just received)    To:  Melanie Walsh  From:  Doristine Johns, RN  Received:  11/26/2016 10:50 AM PDT  Dear Melanie Walsh,     Our records show that you are overdue for the following: Bone Density Scan (Osteoporosis Screening) and Mammogram (Breast Cancer Screening) for your health monitoring. We have already ordered these tests and/or consult for you. Please proceed to a [facility] laboratory for your lab test(s) or call to set up and appointment for your consult/scan.     If you have any questions, please contact your primary care physician.    Thank you,    From the office of Dr. Jannifer Franklin, [address]  [place]      Mammogram Instructions: Please call [facility] Scheduling at [phone] to schedule your appointment at your earliest convenience. In order to obtain the most accurate insurance approval, we secure authorization after you are registered and scheduled for imaging services. Please have your insurance cards available when scheduling your exam.

## 2016-11-27 ENCOUNTER — Telehealth (INDEPENDENT_AMBULATORY_CARE_PROVIDER_SITE_OTHER): Payer: Self-pay | Admitting: Internal Medicine

## 2016-11-27 ENCOUNTER — Other Ambulatory Visit
Admission: RE | Admit: 2016-11-27 | Discharge: 2016-11-27 | Disposition: A | Discharge status: Home / Self Care | Payer: BC Managed Care – PPO | Attending: Internal Medicine | Admitting: Internal Medicine

## 2016-11-27 DIAGNOSIS — Z0000 Encounter for general adult medical examination without abnormal findings: Principal | ICD-10-CM | POA: Insufficient documentation

## 2016-11-27 DIAGNOSIS — Z0189 Encounter for other specified special examinations: Principal | ICD-10-CM

## 2016-11-27 DIAGNOSIS — I10 Essential (primary) hypertension: Secondary | ICD-10-CM | POA: Insufficient documentation

## 2016-11-27 LAB — BASIC METABOLIC PANEL, BLOOD
Anion Gap: 14 mmol/L (ref 7–15)
BUN: 21 mg/dL — ABNORMAL HIGH (ref 6–20)
Bicarbonate: 29 mmol/L (ref 22–29)
Calcium: 10 mg/dL (ref 8.5–10.6)
Chloride: 97 mmol/L — ABNORMAL LOW (ref 98–107)
Creatinine: 0.77 mg/dL (ref 0.51–0.95)
GFR: >60 mL/min
Glucose: 99 mg/dL (ref 70–99)
Potassium: 4.3 mmol/L (ref 3.5–5.1)
Sodium: 140 mmol/L (ref 136–145)

## 2016-11-27 NOTE — Telephone Encounter (Signed)
Provided scheduling info for dexa via mychart.

## 2016-11-27 NOTE — Telephone Encounter (Signed)
Melanie Walsh calling from [employer] calling to speak with nurse.     Agent tried to contact nurse at time of call with no success.     Melanie Walsh  stating  all labs have to be put back in from 10/26/16 they where accidentally released. A1C, CBC, Chemistry, Lipid THS, Hep. C,  Antibody, Vit D, and carbamazepine.    When Dr. Gorden Harms back in please put expected date.     Please assist.

## 2016-11-27 NOTE — Telephone Encounter (Signed)
Placed call to [facility] and spoke to [place].  Informed her of MD msg below, no need to reorder labs, she understood and was appreciative.

## 2016-11-27 NOTE — Telephone Encounter (Signed)
Please clarify request  Just had the full lab panel done through outside lab    Just needs BMP

## 2016-12-04 ENCOUNTER — Encounter (INDEPENDENT_AMBULATORY_CARE_PROVIDER_SITE_OTHER): Payer: Self-pay | Admitting: Internal Medicine

## 2016-12-04 NOTE — Telephone Encounter (Signed)
From: Delmer Islam  To: Berta Minor, MD  Sent: 12/04/2016 12:01 PM PDT  Subject: 2-Procedural Question    Hi Dr. Jannifer Franklin,    I saw Dr. Denyse Amass for my one year check up on my knee. He wants to take out the hardware as soon as I am available. The plate is a little too big and causing problems with locking and arthritis. I need a medical clearance before he will schedule the surgery. Could you please write me a letter for clearance so I can give it to his surgery scheduler.    Also, My mammogram is scheduled in a week. I did have the blood test to recheck my kidneys since starting the new blood pressure medicine. My blood pressure is much better on this new medicine. Average is 110/82    I am working on the Colonoscopy with my schedule for work and school.    Thank you,    Melanie Walsh

## 2016-12-07 NOTE — Telephone Encounter (Signed)
Mychart sent advising pre-op appt.

## 2016-12-14 ENCOUNTER — Other Ambulatory Visit (INDEPENDENT_AMBULATORY_CARE_PROVIDER_SITE_OTHER): Payer: Self-pay | Admitting: Internal Medicine

## 2016-12-14 ENCOUNTER — Ambulatory Visit (INDEPENDENT_AMBULATORY_CARE_PROVIDER_SITE_OTHER): Payer: BC Managed Care – PPO | Admitting: Ophthalmology

## 2016-12-14 ENCOUNTER — Ambulatory Visit
Admission: RE | Admit: 2016-12-14 | Discharge: 2016-12-14 | Disposition: A | Discharge status: Home / Self Care | Payer: BC Managed Care – PPO | Attending: Diagnostic Radiology | Admitting: Diagnostic Radiology

## 2016-12-14 ENCOUNTER — Ambulatory Visit (HOSPITAL_BASED_OUTPATIENT_CLINIC_OR_DEPARTMENT_OTHER)
Admit: 2016-12-14 | Discharge: 2016-12-14 | Disposition: A | Discharge status: Home Health | Payer: BC Managed Care – PPO | Attending: Internal Medicine | Admitting: Internal Medicine

## 2016-12-14 DIAGNOSIS — H01023 Squamous blepharitis right eye, unspecified eyelid: Secondary | ICD-10-CM

## 2016-12-14 DIAGNOSIS — Z1231 Encounter for screening mammogram for malignant neoplasm of breast: Principal | ICD-10-CM

## 2016-12-14 DIAGNOSIS — H18603 Keratoconus, unspecified, bilateral: Secondary | ICD-10-CM

## 2016-12-14 DIAGNOSIS — N200 Calculus of kidney: Secondary | ICD-10-CM | POA: Insufficient documentation

## 2016-12-15 ENCOUNTER — Other Ambulatory Visit (INDEPENDENT_AMBULATORY_CARE_PROVIDER_SITE_OTHER): Payer: Self-pay | Admitting: Family Medicine

## 2016-12-15 DIAGNOSIS — Z5181 Encounter for therapeutic drug level monitoring: Principal | ICD-10-CM

## 2016-12-15 MED ORDER — VIVELLE-DOT 0.05 MG/24HR TD PTTW
MEDICATED_PATCH | TRANSDERMAL | 0 refills | Status: DC
Start: 2016-12-15 — End: 2017-02-19

## 2016-12-18 ENCOUNTER — Ambulatory Visit: Payer: BC Managed Care – PPO | Attending: Urology | Admitting: Urology

## 2016-12-18 ENCOUNTER — Encounter (HOSPITAL_BASED_OUTPATIENT_CLINIC_OR_DEPARTMENT_OTHER): Payer: Self-pay | Admitting: Urology

## 2016-12-18 VITALS — BP 150/89 | HR 102 | Temp 98.1°F | Resp 17 | Ht 63.0 in | Wt 174.0 lb

## 2016-12-18 DIAGNOSIS — N200 Calculus of kidney: Principal | ICD-10-CM | POA: Insufficient documentation

## 2016-12-18 NOTE — Patient Instructions (Signed)
Conservative Management of Kidney Stones    Here are some general dietary guidelines to prevent kidney stones.     1. INCREASE FLUIDS TO AT LEAST 100 OUNCES (TEN 10-OUNCE GLASSES) EVERY DAY. Any type of beverage will count towards your fluid intake. This includes water, juice, milk, tea and any other beverages. It is best to avoid high sugar, high caloric beverages and to steer toward water and reduced calorie fruit juices. Tomato and vegetable juices should be reduced sodium in order to avoid excessive sodium intake. One example is lemonade, made from four (4) ounces of reconstituted lemon juice in two (2) quarts of water, sweetened to taste with sugar or artificial sweetner. It is important to distribute fluid intake throughout the day, including before bedtime, for maximum stone prevention.    2. Maintain a urine output of at least 2000cc every day (approximately two (2) quarts). If you are taking in enough fluids your urine should look clear, not yellow or cloudy.    3. Your diet should be limited in oxalate, a chemical found in certain food and drinks that does not dissolve well in the urine.  Foods and drinks high in oxalate include brewed tea, chocolate, spinach, Vitamin C and dark beer. A thorough oxalate list can be found on the website of the Oxalosis and Hyperoxaluria Foundation (http://www.ohf.org/diet.html).  To decrease the amount of oxalate your body absorbs, drink a glass of milk or eat a cup of yogurt with each meal. The calcium in the milk or yogurt will bind the oxalate and it will not be absorbed by the body.      4. Limit the amount of sodium to 2500 mg/day or less.  2500 md/day is equivalent to about 1 teaspoon of table salt. Reduce high-sodium foods, such as canned soups and vegetables, deli and luncheon meats, cheeses, restaurant or fast foods, convenience foods, salted snack foods. Eat more fresh foods. Instead of salt or seasoning containing sodium, use fresh lemons, herbs, or spices. Check  labels for ingredients and hidden sodium, such as monosodium glutamate (MSG), sodium bicarbonate (baking powder or baking soda) disodium phosphate, sodium alginate, and sodium nitrate or nitrite. Look for canned, boxed, frozen and prepared foods with less than 300mg of sodium per serving.    5. Avoid heavy intake of red meat protein. Limit daily protein intake to 8-12 ounces per day of beef, poultry, fish and pork. As an estimate, 4 ounces of meat is about the size of a deck of cards. Increase protein intake from other sources, such as beans, grains, and vegetables.    6. Include adequate amounts of calcium in your diet. Have 2-3 serving of dairy a day distributed evenly among meals and snacks.  One serving equals 1 cup of yogurt or milk, 1  ounces of cheese, or  cup of cottage cheese.

## 2016-12-18 NOTE — Progress Notes (Signed)
Urology Follow-up Stone Note    Assessment/Plan:  Nephrolithiasis due to Low Volume urine, Hyperoxaluria    I addressed urolithiasis with the patient today. I have personally reviewed the images, radiology report, labs, and old records. Much improved urinary parameters, though should watch excess animal protein. Consider adding KCitrate if she forms new stones in interim. RTC 3 months. Plan for dietary changes.     Chief Complaint  - Nephrolithiasis     HPI  Melanie Walsh is a 56 year old female with a history of urolithiasis who presents for routine follow-up.    Hx of Right pyelonephritis, refractory ESBL    S/p Left URS 03/30/2016  S/p Left Bilateral URS/litho 03/26/2016        She reports one symptomatic stone event since her last visit. These were associated with recent passage of stones. Since her last visit, Melanie Walsh underwent no interval procedures for stones. At their last visit, the plan was imaging, 24 hour urine collection and dietary changes.     [name] was started on no new medications at her  last visit. The patient reports n/a - no new medications were prescribed prescribed. Dietary changes recommended at the patient's last visit were Increasing fluid intake and Reducing oxalates in their diet. Dietary change adherence: She has been adhering to recommendations.     Allergies   Allergen Reactions    Adhesive Tape Rash    Latex Rash    Morphine Shortness of Breath and Swelling    Nickel Rash     Swelling and rash    Petroleum Jelly [Petrolatum] Rash    Sulfa Drugs Rash     Related topical skin cream    Dilaudid  [Hydromorphone Hcl] Unspecified    Erythromycin Nausea Only    Oxycodone-Aspirin Unspecified     Current Outpatient Prescriptions   Medication Sig Dispense Refill    amLODIPINE (NORVASC) 5 MG tablet Take 7.5 mg by mouth daily.       carBAMazepine (TEGRETOL) 200 MG tablet Take 1 tablet (200 mg) by mouth daily. 90 tablet 1    Cholecalciferol (VITAMIN D PO)       fluticasone-salmeterol  (ADVAIR) 100-50 MCG/DOSE inhaler Inhale 1 puff by mouth every 12 hours.      HYDROcodone-acetaminophen (NORCO) 5-325 MG tablet Take 1 tablet by mouth every 6 hours as needed for Moderate Pain (Pain Score 4-6). 20 tablet 0    levalbuterol (XOPENEX HFA) 45 MCG/ACT inhaler Xopenex HFA 45 mcg/actuation aerosol inhaler      levothyroxine (SYNTHROID) 50 MCG tablet Take 1 tablet (50 mcg) by mouth daily. 90 tablet 1    losartan-hydrochlorothiazide (HYZAAR) 100-25 MG tablet Take 1 tablet by mouth daily. Replaces plain hctz 30 tablet 1    metFORMIN (GLUCOPHAGE XR) 500 MG XR tablet Take 2 tablets (1,000 mg) by mouth daily. 180 tablet 1    VIVELLE-DOT 0.05 MG/24HR patch APPLY 1 PATCH ON SKIN TWICE WEEKLY 24 patch 0     No current facility-administered medications for this visit.      Past Medical History  Past Medical History:   Diagnosis Date    Allergic rhinitis     Asthma     DVT (deep venous thrombosis) (CMS-HCC)     HTN (hypertension)     Kidney disease, medullary sponge     Kidney stones     Pneumonia     Thyroid disease     Trigeminal neuralgia     UTI (urinary tract infection)  Patient Active Problem List   Diagnosis    Nephrolithiasis    Medullary sponge kidney    Trigeminal neuralgia    Hypothyroidism, unspecified type    Essential hypertension    Recurrent UTI       Past Surgical History  Past Surgical History:   Procedure Laterality Date    ANTERIOR CRUCIATE LIGAMENT REPAIR      appendix      HYSTERECTOMY      KNEE SURGERY Bilateral     OSTEOTOMY      SHOULDER SURGERY       Family History  Family History   Problem Relation Age of Onset    Cancer Father     Heart Disease Father     Hypertension Father     Cholesterol/Lipid Disorder Father     Cancer Maternal Grandfather     Cancer Paternal Grandmother     Stroke Paternal 34     Cancer Paternal Grandfather     Diabetes Paternal Grandfather     Melanoma Cancer Mother     Dementia Mother     Thyroid Mother     Other  Mother      kidney disease    Cholesterol/Lipid Disorder Sister     Other Daughter      kidney disease     Social History  Social History     Social History    Marital status: Married     Spouse name: N/A    Number of children: N/A    Years of education: 4 Year      Social History Main Topics    Smoking status: Never Smoker    Smokeless tobacco: Never Used    Alcohol use Yes      Comment: Surveyor, mining     Drug use: No      Comment: denies    Sexual activity: Not on file     Social Activities of Daily Living Present    Not on file     Social History Narrative    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 All other systems reviewed and found negative.     Objective  Physical Exam  There were no vitals filed for this visit.  GENERAL: The patient is an alert, cooperative female in no acute distress.   HEENT: normalocephalic/atraumatic  NECK: midline trachea. No jugular venous distention  PULM: no evidence of labored breathing  EXTREMITIES: Joints are normal without redness or swelling.   SKIN: There is no edema or cyanosis.   NEURO: no motor or sensory deficits.  Alert and Oriented x 3    Lab Review: The following lab results were personally reviewed by me:  BMP Results Latest Ref Rng & Units 11/27/2016 07/19/2016 07/18/2016   GLU 70 - 99 mg/dL 99 103(H) 100(H)   BUN 6 - 20 mg/dL 21(H) 21(H) 16   CREAT 0.51 - 0.95 mg/dL 0.77 0.85 0.81   GFRNON mL/min >60 >60 >60   NA 136 - 145 mmol/L 140 143 143   K 3.5 - 5.1 mmol/L 4.3 3.7 4.1   CL 98 - 107 mmol/L 97(L) 102 106   BICARB 22 - 29 mmol/L 29 29 27    ANION 7 - 15 mmol/L 14 12 10  [place] 8.5 - 10.6 mg/dL 10.0 9.0 8.5     CBC Results Latest Ref Rng & Units 07/19/2016 07/18/2016 07/17/2016   WBC 4.0 - 10.0 1000/mm3 5.8 6.2 6.5   RBC 3.90 - 5.20 mill/mm3 4.10 3.73(L) 3.95   HGB 11.2 - 15.7 gm/dL 13.6 12.4  13.1   HCT 34.0 - 45.0 % 40.1 37.0 38.7   PLT 140 - 370 1000/mm3 224 216 237   No flowsheet data found.    24-hour urine collection - this was personally reviewed and results were discussed with the patient. It demonstrates the following:    [facility] Results Latest Ref Rng & Units 05/12/2016 05/12/2016 11/20/2014   VOL 0.5 - 5 Liters/Day 1.5 1.5 0.9   SS CAOX 6 - 10 12.5(A) 12.5(A) 14.7(A)   [place] 0 - 200 mg/day 175 175 236(A)   OXALATE 20 - 40 mg/day 61.0(A) 61.0(A) 32.0   CITRATE 550 mg/day 753 753 456(A)   SS CaP 0.5 - 2 0.6 0.6 1.2   pH 5.8 - 6.2 5.7(A) 5.7(A) 5.6(A)   SS URIC 0 - 1 1 1  2(A)   URIC ACID CONC 0 - 0.750 g/day 0.473 0.473 0.551   NA 50 - 150 mmol/day 168(A) 168(A) 126   K 20 - 100 mmol/day 74 74 46   MG 30 - 120 mg/day 110 110 85   PHOS 0.6 - 1.2 g/day 0.7 0.7 0.8   NH4 15 - 60 mmol/day 29 29 30    CHOL 70 - 250 mmol/day 205 205 126   SULF 20 - 80 mEq/day 23 23 38   UREA 6 - 14 g/day 6.7 6.7 10.3   PCR 0.8 - 1.4 g/kg/day 0.7(A) 0.7(A) 1.1   CREAT mg/day 1093.0 1093.0 1289.0   CR/KG 15 - 20 mg/kg/day 14.2(A) 14.2(A) 18.3   [place]/KG 0 - 4.00 mg/kg/day 2.30 2.30 3.40   [place]/CR 0 - 140 mg/g 160(A) 160(A) 183(A)       Imaging  The following imaging was personally reviewed and the findings were discussed with the patient.    Stone and Imaging 07/31/2016   [facility]   Imaging Date 07/17/2016   Image Type CT abdomen/pelvis   Was there an increase in stone burden compared to previous imaging? No   Number of Stones - Right 0   Size of Stones - Right -   Location of Stones - Right -   Hydronephrosis Severity - Right -   Description - Right -   Number of Stones - Left 1   Size of Stones - Left 3   Location of Stone - Left distal ureter   Hydronephrosis Severity - Left mild   Description - Left Ureteral stone only       Sherrian Divers, MD

## 2016-12-23 ENCOUNTER — Ambulatory Visit (INDEPENDENT_AMBULATORY_CARE_PROVIDER_SITE_OTHER): Payer: BC Managed Care – PPO | Admitting: Internal Medicine

## 2016-12-23 VITALS — BP 144/82 | HR 86 | Temp 98.3°F | Ht 63.0 in | Wt 176.0 lb

## 2016-12-23 DIAGNOSIS — Z01818 Encounter for other preprocedural examination: Secondary | ICD-10-CM

## 2016-12-23 DIAGNOSIS — I10 Essential (primary) hypertension: Principal | ICD-10-CM

## 2016-12-23 DIAGNOSIS — Z01810 Encounter for preprocedural cardiovascular examination: Secondary | ICD-10-CM

## 2016-12-23 MED ORDER — HYDROCHLOROTHIAZIDE 25 MG OR TABS
25.0000 mg | ORAL_TABLET | Freq: Every day | ORAL | 5 refills | Status: DC
Start: 2016-12-23 — End: 2017-06-26

## 2016-12-23 NOTE — Patient Instructions (Addendum)
Try taking hctz 25 mg in the morning and norvasc 5 mg at night

## 2016-12-23 NOTE — Progress Notes (Signed)
Chief Complaint   Patient presents with    Surgery Clearance     R knee     Medication Review         SUBJECTIVE::Melanie Walsh is a 56 year old female here for preoperative evaluation    Planned surgery: R knee hardware removal  Surgery date: pending    Past surgical history- reviewed, no hx of adverse reaction to anesthesia or excessive bleeding  Does have a history of post-operative DVT approx 11-12 years ago; has required SQ lovenox following subsequent surgeries    Able to climb 2 flights of stairs without chest pain or DOE    No hx of sleep apnea  Mild intermittent asthma- well controlled on advair    htn- on hyzaar alone, home bps 140s/80s  Making her feel really tired, inquiring about medication change    Patient Active Problem List    Diagnosis Date Noted    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  Allergies and medications reviewed and updated as below.    Past Medical History:   Diagnosis Date    Allergic rhinitis     Asthma     DVT (deep venous thrombosis) (CMS-HCC)     HTN (hypertension)     Kidney disease, medullary sponge     Kidney stones     Pneumonia     Thyroid disease     Trigeminal neuralgia     UTI (urinary tract infection)        Past Surgical History:   Procedure Laterality Date    ANTERIOR CRUCIATE LIGAMENT REPAIR      appendix      HYSTERECTOMY      KNEE SURGERY Bilateral     OSTEOTOMY      SHOULDER SURGERY         Family History   Problem Relation Age of Onset    Cancer Father     Heart Disease Father     Hypertension Father     Cholesterol/Lipid Disorder Father     Cancer Maternal Grandfather     Cancer Paternal Grandmother     Stroke Paternal Grandmother     Cancer Paternal Grandfather        Diabetes Paternal Grandfather     Melanoma Cancer Mother     Dementia Mother     Thyroid Mother     Other Mother      kidney disease    Cholesterol/Lipid Disorder Sister     Other Daughter      kidney disease       Social History   Substance Use Topics    Smoking status: Never Smoker    Smokeless tobacco: Never Used    Alcohol use Yes      Comment: [place] History Narrative    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    2 adult twin daughters ([place] and Mulino merced)    Going to school for masters    married     Obstetric History     No data available       Allergies   Allergen Reactions  Adhesive Tape Rash    Latex Rash    Morphine Shortness of Breath and Swelling    Nickel Rash     Swelling and rash    Petroleum Jelly [Petrolatum] Rash    Sulfa Drugs Rash     Related topical skin cream    Dilaudid  [Hydromorphone Hcl] Unspecified    Erythromycin Nausea Only    Oxycodone-Aspirin Unspecified       Current Outpatient Prescriptions   Medication Sig    amLODIPINE (NORVASC) 5 MG tablet Take 5 mg by mouth daily.     carBAMazepine (TEGRETOL) 200 MG tablet Take 1 tablet (200 mg) by mouth daily.    Cholecalciferol (VITAMIN D PO)     fluticasone-salmeterol (ADVAIR) 100-50 MCG/DOSE inhaler Inhale 1 puff by mouth every 12 hours.    hydrochlorothiazide (HYDRODIURIL) 25 MG tablet Take 1 tablet (25 mg) by mouth daily.    HYDROcodone-acetaminophen (NORCO) 5-325 MG tablet Take 1 tablet by mouth every 6 hours as needed for Moderate Pain (Pain Score 4-6).    levalbuterol (XOPENEX HFA) 45 MCG/ACT inhaler Xopenex HFA 45 mcg/actuation aerosol inhaler    levothyroxine (SYNTHROID) 50 MCG tablet Take 1 tablet (50 mcg) by mouth daily.    metFORMIN (GLUCOPHAGE XR) 500 MG XR tablet Take 2 tablets (1,000 mg) by mouth daily.    VIVELLE-DOT 0.05 MG/24HR patch APPLY 1 PATCH ON SKIN TWICE WEEKLY     No current facility-administered medications for this  visit.        Immunizations reviewed, please see immunizations in EPIC.    ROS:12 point written review of systems, reviewed and negative except as per HPI and below as in bold  Constitutional: negative for: fatigue, night sweats, weight loss, loss of energy, anorexia, fever.   Eyes: negative for: blurry vision, visual loss, red eyes, eye pain.   ENMT: negative for: hearing loss, ear pain, tinnitus, dental problems, odynophagia, change in voice or hoarseness  CV: negative for: palpitations, chest pain, paroxysmal nocturnal dyspnea, orthopnea, lower extremity edema, pain in calves with walking   Resp: negative for: cough, shortness of breath, pleuritic pain, wheezing, DOE  GI: negative for: heartburn, nausea, vomiting, abdominal pain, constipation, diarrhea, jaundice, change in stool caliber, rectal bleeding or black stools  Endo: negative for: cold intolerance, heat intolerance, polyphagia, polydipsia, polyuria.  GU: negative for: nocturia, frequency, dysuria, urgency, hesitancy, hematuria, incontinence   Breast/GYN: negative for: vaginal discharge, pain with sex, loss of sexual desire, spotting between periods, excessive menstruation, nipple bleeding or discharge, tenderness in breast, menopausal symptoms  Musculoskeletal: negative for: back pain, muscle pain, joint pain, joint swelling, joint redness   Neuro: negative for: confusion, headaches, seizures, lightheadedness, memory loss, vertigo, paralysis/weakness, clumsiness, tremor, speech impairment.   Psych: negative for: anhedonia, depressed mood, crying spells, anxiety, trouble sleeping, hallucinations, impaired concentration, stress  Allergy/Immun: negative for: stuffy nose and sinuses, itchy/red/tearing eyes  Skin: no new skin rashes or lesions    OBJECTIVE:: BP 144/82 (BP Location: Left arm, BP Patient Position: Sitting, BP cuff size: Large)   Pulse 86   Temp 98.3 F (36.8 C) (Oral)   Ht 5\' 3"  (1.6 m)   Wt 79.8 kg (176 lb)   LMP 03/31/2007 (Approximate)    SpO2 99%   BMI 31.18 kg/m2  Physical Exam:   CONSTITUTIONAL:nad  EYES: Anicteric, sclera not injected.  ENMT:  OP and tongue without lesions, erythema or exudate  NECK: supple, No thyromegaly, trachea midline  LYMPH:  No cervical, supraclavicular LAD  CV:  RRR, no murmurs, rubs, or gallops.   No LE edema    RESP:  Clear to auscultation b/l,normal effort.   ABDOMEN:  Soft, nontender, nondistended.  No rebound or gaurding.   MUSCULOSKELETAL:  Moves all 4 extremities , normal muscle bulk and tone.     NEURO: EOMI, perrl, no facial droop.  SKIN: warm, dry, no rash  PSYCH: A and O x 3, normal affect         I have reviewed the most recent lab and imaging data.  Lab Results   Component Value Date    NA 140 11/27/2016    K 4.3 11/27/2016    CL 97 (L) 11/27/2016    BICARB 29 11/27/2016    BUN 21 (H) 11/27/2016    CREAT 0.77 11/27/2016    GLU 99 11/27/2016    [place] 10.0 11/27/2016     Lab Results   Component Value Date    WBC 5.8 07/19/2016    RBC 4.10 07/19/2016    HGB 13.6 07/19/2016    HCT 40.1 07/19/2016    MCV 97.8 (H) 07/19/2016    MCHC 33.9 07/19/2016    RDW 12.6 07/19/2016    PLT 224 07/19/2016    MPV 10.5 07/19/2016     Lab Results   Component Value Date    A1C 5.3 03/28/2016     Lab Results   Component Value Date    TSH 1.28 07/17/2016     No results found for: VITD25HYDROX, VD2, VD3, VDT  No results found for: CHOL, HDL, LDLCALC, TRIG, LDLDIRECT  No results found for: HIV1TO2AB  No results found for: RPR           A/P: Melanie Walsh was seen today for surgery clearance and medication review.    Diagnoses and all orders for this visit:    Essential hypertension- d/c hyzaar due to concern for side effect of fatigue- start hctz 25 mg/day, restart norvasc 5 mg/day, continue home blood pressure monitoring  -     hydrochlorothiazide (HYDRODIURIL) 25 MG tablet; Take 1 tablet (25 mg) by mouth daily.    Preop cardiovascular exam-  Ms. Mackley is without active cardiac conditions, including unstable coronary syndrome, decompensated  heart failure, arrhythmia, or severe valvular disease. She plans to undergo an intermediate risk procedure. She does not have any clinical risk factors.   She has an adequate functional capacity estimated at > 4 mets, and as such does not require further noninvasive cardiac risk stratification at this time.  Will defer preoperative labs to [facility] preoperative clinic; will defer decision for prophylaxis to surgeon   EKG to be performed at [facility] per pt preference                Follow-up prn    Patient Instruction:  See Patient Education/Instruction section.

## 2016-12-25 ENCOUNTER — Ambulatory Visit (INDEPENDENT_AMBULATORY_CARE_PROVIDER_SITE_OTHER): Payer: BC Managed Care – PPO | Admitting: Dermatology

## 2016-12-25 DIAGNOSIS — M2391 Unspecified internal derangement of right knee: Secondary | ICD-10-CM | POA: Insufficient documentation

## 2016-12-25 DIAGNOSIS — L821 Other seborrheic keratosis: Secondary | ICD-10-CM

## 2016-12-25 DIAGNOSIS — L309 Dermatitis, unspecified: Secondary | ICD-10-CM

## 2016-12-25 DIAGNOSIS — D1801 Hemangioma of skin and subcutaneous tissue: Secondary | ICD-10-CM

## 2016-12-25 DIAGNOSIS — L814 Other melanin hyperpigmentation: Secondary | ICD-10-CM

## 2016-12-25 DIAGNOSIS — D229 Melanocytic nevi, unspecified: Secondary | ICD-10-CM

## 2016-12-25 DIAGNOSIS — L570 Actinic keratosis: Principal | ICD-10-CM

## 2016-12-25 DIAGNOSIS — L578 Other skin changes due to chronic exposure to nonionizing radiation: Secondary | ICD-10-CM

## 2016-12-25 DIAGNOSIS — I781 Nevus, non-neoplastic: Secondary | ICD-10-CM

## 2016-12-25 MED ORDER — PIMECROLIMUS 1 % EX CREA
TOPICAL_CREAM | CUTANEOUS | 3 refills | Status: DC
Start: 2016-12-25 — End: 2017-09-23

## 2016-12-25 NOTE — Progress Notes (Signed)
[facility]   [place], [place] [address]  [phone]     Primary MD: Rose Fillers Bennitt  Consult Requested By: Jenny Reichmann*    had concerns including Recheck.    Chief complaint: sun-damaged skin    NEW PATIENT    HISTORY:  Melanie Walsh is a 56 year old female here for evaluation of sun-damaged skin.      Lesion of concern today: multiple recurrent scaly spots on face/arms, come and go for months, not itchy or tender, no exacerbating factors. Has done Efudex in the past.   Rash on face around mouth, recurrent for years, currently used loofah to scrub face with olive oil saop   Otherwise no other itching, bleeding, painful, growing, ulcerating, or concerning lesions.    SOCIAL HISTORY:  Going back to school, working on masters    PAST DERM HISTORY:  Melanoma hx: negative   NMSC hx: negative   Allergic to most topical things (even vasoline)  Status post Efudex on arms in the past    FAMILY HISTORY:  Family Hx of melanoma: positive   Family hx of skin disease: negative .   Mother had melanoma    PAST MEDICAL HISTORY:  The patient  has a past medical history of Allergic rhinitis; Asthma; DVT (deep venous thrombosis) (CMS-HCC); HTN (hypertension); Kidney disease, medullary sponge; Kidney stones; Pneumonia; Thyroid disease; Trigeminal neuralgia; and UTI (urinary tract infection).          MEDICATIONS:  Medications:   Current Outpatient Prescriptions:     amLODIPINE (NORVASC) 5 MG tablet, Take 5 mg by mouth daily. , Disp: , Rfl:     carBAMazepine (TEGRETOL) 200 MG tablet, Take 1 tablet (200 mg) by mouth daily., Disp: 90 tablet, Rfl: 1    Cholecalciferol (VITAMIN D PO), , Disp: , Rfl:     fluticasone-salmeterol (ADVAIR) 100-50 MCG/DOSE inhaler, Inhale 1 puff by mouth every 12 hours., Disp: , Rfl:     hydrochlorothiazide (HYDRODIURIL) 25 MG tablet, Take 1 tablet (25 mg) by mouth daily., Disp: 30 tablet, Rfl: 5    HYDROcodone-acetaminophen (NORCO) 5-325 MG tablet, Take 1  tablet by mouth every 6 hours as needed for Moderate Pain (Pain Score 4-6)., Disp: 20 tablet, Rfl: 0    levalbuterol (XOPENEX HFA) 45 MCG/ACT inhaler, Xopenex HFA 45 mcg/actuation aerosol inhaler, Disp: , Rfl:     levothyroxine (SYNTHROID) 50 MCG tablet, Take 1 tablet (50 mcg) by mouth daily., Disp: 90 tablet, Rfl: 1    metFORMIN (GLUCOPHAGE XR) 500 MG XR tablet, Take 2 tablets (1,000 mg) by mouth daily., Disp: 180 tablet, Rfl: 1    pimecrolimus (ELIDEL) 1 % cream, Apply twice daily as needed for redness on face, Disp: 30 g, Rfl: 3    VIVELLE-DOT 0.05 MG/24HR patch, APPLY 1 PATCH ON SKIN TWICE WEEKLY, Disp: 24 patch, Rfl: 0    Allergies: Adhesive tape; Latex; Morphine; Nickel; Petroleum jelly [petrolatum]; Sulfa drugs; Dilaudid  [hydromorphone hcl]; Erythromycin; and Oxycodone-aspirin    REVIEW OF SYSTEMS:  CONSTITUTIONAL: negative for fever or chills  DERMATOLOGIC: Feels well, no other skin complaints.        PHYSICAL EXAM:  Skin Type:                    2  General Appearance: within normal limits, WDWN  Neuro: Alert and oriented x 3  Psych: Mood and affect within normal limits  Eyes: Inspection of lids, sclera, and conjunctiva within normal limits  Skin: The areas examined included scalp and face, neck, RUE, LUE, chest, abdomen, back, RLE, LLE, buttock   *she declined examination of her thighs but I examined her bilateral lower legs   Pertinent findings below:  -Well demarcated, brown, stuck on appearing plaques on head, trunk and extremities  -Scattered even-bordered, even-pigmented macules and papules on the head, trunk and extremities  -Scattered 2-4 mm cherry red macules/papules  -Gritty erythematous macules of the arms and face  -Dyschromia, thinning, loss of elasticity, and wrinkling of the skin in sun exposed areas  -scattered tan 2-32mm uniform macules on sun exposed areas  -perioral area and NLF with erythematous scaling patches       ASSESSMENT AND TREATMENT PLAN:  1. Actinic keratosis   - as  noted in exam  - Discussed with patient the premalignant nature of these lesions. After discussion of treatment options and risks, benefits, alternatives of cryotherapy, patient was verbally consented for treatment with liquid nitrogen cryotherapy including atrophy, scar, dyspigmentation, pain, recurrence and incomplete treatment.  Liquid nitrogen x 5-7 seconds x 2 cycles to 5 lesions on the arms and face.  Tolerated without complication. Expectations of some redness, blistering discussed. Patient was instructed not pick at the areas. Instructed patient to return if lesions fail to fully resolve.     2. Facial dermatitis  Differential diagnosis eczema versus perioral dermatitis  STOP loofah  Trial of Elidel, advised her to apply small amount to forearm, wait 3 days and then if no reaction, can use on face   Can apply to face twice daily     3. Seborrheic Keratosis   4. Lentigines   - lesion discussed and reassurance given.  Patient to return if lesion noticeably changes in color or appearance for reevaluation.    5. Cherry Angiomas  - reassurance of benign nature of lesions, questions answered  - no treatment necessary today    6. Multiple Benign Nevi  - reassurance of benign nature of lesions, questions answered  - no treatment necessary today  - return to clinic for new, changing or symptomatic lesions    7. Sun Damaged Skin and Skin Health Maintenance    - Importance of biannual TBSE by physician reviewed with patient  - Discussed importance of sun protection, including avoidance of sun at peak hours (10 am-4 pm), protective clothing, and use of sunscreen with SPF 30 or greater with frequent reapplication (every 1 to 1.5 hours, or more frequently if in water or if heavy sweating).  Best sunscreens use zinc or titanium oxide as the active ingredient and act as a physical blocker.   - ABCDEs of melanoma reviewed; Signs of new and recurrent NMSC discussed with patient       Return in about 5 months (around 05/27/2017)  for recheck .         [name] Rea, MD

## 2016-12-25 NOTE — Patient Instructions (Signed)
[facility]    LIQUID NITROGEN TREATMENT    Liquid nitrogen is a cold, liquefied gas with a temperature of -196 degrees Celsius (-321 degrees Fahrenheit). It is used to freeze and destroy superficial skin growths such as warts and actinic keratoses. Liquid nitrogen causes stinging and mild pain while the skin growth freezes and then thaws. The discomfort lasts less than five minutes.    Several hours after liquid nitrogen treatment, your skin will become swollen and red; later, it may even blister. Then a scab (crust) will form. It will fall off by itself in one to three weeks. The skin growth will come off along with the scab, leaving healthy new skin.    If your skin growth required deep freezing to remove, there may be considerable blistering and swelling, especially if your hands or eyelids were treated. The blisters and swelling are part of the treatment and will gradually heal by themselves. However, on rare occasions, this treatment may cause long-term or even permanent loss of pigment (color) in the treated skin.    No special care is needed after liquid nitrogen treatment. You can wash your skin as usual and use makeup or other cosmetics. If clothing irritates the area, cover it with a small bandage (Band-aid).    Sometimes liquid nitrogen treatment fails. If the skin growth is not cured by liquid nitrogen, please make a return appointment.

## 2017-01-05 ENCOUNTER — Telehealth (INDEPENDENT_AMBULATORY_CARE_PROVIDER_SITE_OTHER): Payer: Self-pay | Admitting: Internal Medicine

## 2017-01-05 ENCOUNTER — Encounter (INDEPENDENT_AMBULATORY_CARE_PROVIDER_SITE_OTHER): Payer: Self-pay | Admitting: Dermatology

## 2017-01-05 NOTE — Telephone Encounter (Signed)
From: Melanie Walsh  To: Altamease Oiler, MD  Sent: 01/05/2017 9:30 AM PDT  Subject: 1-Non Urgent Medical Advice    Hi Dr Lilyan Gilford,     My insurance will not cover the Elidel cream that you prescribed without an authorization. Would it be possible to send in an authorization request?    Thank you,    Melanie Walsh

## 2017-01-05 NOTE — Telephone Encounter (Signed)
Patient called to schedule consult referral order with Dr. Myles Gip. Offered 10/23 and 10/30 and patient declined. Per patient need Monday or Wednesday.    Please advise if Dr. Myles Gip can accommodate patient.    Referral order:   [facility] (Order # [id number]) on 10/26/2016       Indications: menopause- Dr. Myles Gip

## 2017-01-05 NOTE — Telephone Encounter (Signed)
Melanie Walsh,  Can you assist with this. Thanks.

## 2017-01-06 ENCOUNTER — Ambulatory Visit (HOSPITAL_BASED_OUTPATIENT_CLINIC_OR_DEPARTMENT_OTHER): Payer: BC Managed Care – PPO

## 2017-01-06 ENCOUNTER — Institutional Professional Consult (permissible substitution) (INDEPENDENT_AMBULATORY_CARE_PROVIDER_SITE_OTHER): Payer: BC Managed Care – PPO | Admitting: Internal Medicine

## 2017-01-07 NOTE — Telephone Encounter (Addendum)
LMOM for patient to please return my call.

## 2017-01-11 NOTE — Telephone Encounter (Signed)
PA submitted for pimecrolimus (ELIDEL) 1 % cream  Key: [facility]  Awaiting determination

## 2017-01-12 ENCOUNTER — Telehealth (INDEPENDENT_AMBULATORY_CARE_PROVIDER_SITE_OTHER): Payer: Self-pay | Admitting: Dermatology

## 2017-01-12 NOTE — Telephone Encounter (Signed)
PA for ELIDEL CREAM has been approved per [employer].       AUTH VALID 01/12/2017-04/[id number]     # of refill: 999    NOTIFIED PATIENTS PHARMACY ABOUT APPROVAL.

## 2017-01-13 HISTORY — PX: KNEE ARTHROSCOPY W/ HARDWARE REMOVAL: SHX1868

## 2017-01-13 NOTE — Telephone Encounter (Signed)
Spoke to patient appointment has been scheduled for 02/10/17.  Patient agreed to date and time of appointment.  Matter solved

## 2017-01-15 ENCOUNTER — Other Ambulatory Visit (INDEPENDENT_AMBULATORY_CARE_PROVIDER_SITE_OTHER): Payer: Self-pay | Admitting: Internal Medicine

## 2017-01-19 NOTE — Telephone Encounter (Signed)
-  Please see telephone encounter from 01/12/17. Matter resolved. Closing encounter.

## 2017-01-27 ENCOUNTER — Ambulatory Visit (HOSPITAL_BASED_OUTPATIENT_CLINIC_OR_DEPARTMENT_OTHER): Payer: BC Managed Care – PPO

## 2017-02-10 ENCOUNTER — Encounter (INDEPENDENT_AMBULATORY_CARE_PROVIDER_SITE_OTHER): Payer: Self-pay | Admitting: Internal Medicine

## 2017-02-10 ENCOUNTER — Institutional Professional Consult (permissible substitution) (INDEPENDENT_AMBULATORY_CARE_PROVIDER_SITE_OTHER): Payer: BC Managed Care – PPO | Admitting: Internal Medicine

## 2017-02-10 ENCOUNTER — Other Ambulatory Visit: Payer: BC Managed Care – PPO | Attending: Internal Medicine

## 2017-02-10 VITALS — BP 158/90 | HR 104 | Temp 98.3°F | Resp 16 | Wt 181.8 lb

## 2017-02-10 DIAGNOSIS — N952 Postmenopausal atrophic vaginitis: Secondary | ICD-10-CM

## 2017-02-10 DIAGNOSIS — E039 Hypothyroidism, unspecified: Secondary | ICD-10-CM

## 2017-02-10 DIAGNOSIS — Z780 Asymptomatic menopausal state: Principal | ICD-10-CM

## 2017-02-10 LAB — TSH, BLOOD: TSH: 1.28 u[IU]/mL (ref 0.27–4.20)

## 2017-02-10 LAB — ESTRADIOL, BLOOD: Estradiol, Blood: 48 pg/mL

## 2017-02-10 MED ORDER — OXYCODONE-ACETAMINOPHEN 5-325 MG OR TABS: 1.00 | ORAL_TABLET | Freq: Every evening | ORAL | Status: AC | PRN

## 2017-02-10 MED ORDER — ESTRADIOL 10 MCG VA TABS
10.0000 ug | ORAL_TABLET | VAGINAL | 3 refills | Status: AC
Start: 2017-02-11 — End: ?

## 2017-02-10 MED ORDER — VENLAFAXINE HCL 37.5 MG OR CP24
37.5000 mg | ORAL_CAPSULE | Freq: Every day | ORAL | 3 refills | Status: DC
Start: 2017-02-10 — End: 2017-06-11

## 2017-02-10 NOTE — Interdisciplinary (Signed)
Blood drawn from right arm with 23 gauge needle. 2 tubes taken.   Patient identity authenticated by Susana Portin Francisco.

## 2017-02-10 NOTE — Patient Instructions (Addendum)
Go to the lab today.     Vaginal moisturizers and lubricants:  Moisturizers: Replens, Moist Again*, K-Y Long-lasting moisturizer, K-Y Silk-E  Lubricants: Water based: Astroglide*, K-Y Liquid*, Burnell Blanks*, FemGlide*, Slippery Stuff*, Pre-Seed*            Silicone based: ID Millenium, Pjur*, Pink*, K-Y Liquibeads            Oil based: vegetable oil, vitamin E oil, olive oil, mineral oil, Elegance Women's Lubricant  (* Safe to use with a condom)    Management of hot flashes/night sweats:  1. Enhance relaxation with meditation, yoga, massage, or a lukewarm bath.  2. Exercise regularly to increase fitness, maintain a healthy weight, and promote better sleep.  3. Keep cool by dressing in layers, using a fan, and sleeping in a cool room.  4. Maintain a healthy body weight and do not smoke, as obesity and smoking are associated with increased incidence of hot flashes.  5. Try paced respiration during a hot flash: slow, deep abdominal breathing lasting 5-10 seconds each, much slower than normal breathing, which may reduce the intensity and duration of a hot flash  6. Avoid perceived personal hot flash triggers (ie hot drinks, caffeine, spicy foods, alcohol, emotional reactions).      [email or web address].soulsource.com (vaginal dilators-10-15 minutes per day at each size for 1 week, then try next size)

## 2017-02-10 NOTE — Progress Notes (Signed)
Menopause Clinic Consult Note:    CC: Patient states " hot flashes "    56 yo female here for consultation regarding menopause, referred by Dr. Jannifer Franklin.    Patient's last menstrual period was 03/31/2007 (approximate).    Pt reports the following menopausal symptoms:   She reports that she had her ovaries removed in 2008 for benign masses after having had a hysterectomy in 2007. She underwent an abrupt surgical menopause and was eventually placed on the Vivelle Dot patch, which she tolerates well even though she has an adhesive allergy. She has hot flashes 1-2 times per week which are severe. She des not sleep at night although she takes Tegretol at night. She only sleeps 4 hours and cannot get back to sleep, and states this has been the case since her oophorectomy. She is not having night sweats but feels hot. She complains of depression, irritability and insomnia, as well as changes in memory and thought processes. She also notices low libido and states that she would like this addressed since it is affecting her relationship with her husband. She also notes some deep dyspareunia and vaginal dryness.    She has some right knee pain related to recent surgery with Dr. Denyse Amass which was complicated by an infection. She has been unable to exercise for the last 2 years due to knee problems, and has gained weight due to this.     She is taking HCTZ in the morning but did not take this today as she had a long drive. She states that she has tried higher doses of amlodipine but this causes fatigue. She takes metformin but states that this is for weight loss and not for diabetes.     She notes that she had a deep vein thrombosis 12 years ago after a left leg surgery with Dr. Denyse Amass but this happened before her hysterectomy and was felt to be directly related to the surgery.     Pt would like to continue and optimize hormone therapy for symptoms.    Osteoporosis Risk Factors:  Personal history of bone fracture  No regular  exercise due to knee surgery  Thyroid medication    Cardiac Disease Risk Factors:  Hypertension  Overweight    Breast Cancer Risk Factors:  First child after age 37    Urogenital Symptoms:  Urinary leakage prior to getting to bathroom on time x 1 year  Loss of urine with coughing, sneezing, laughing    Sexual Health:  Pain with intercourse  Lack of desire  Excessive dryness  Partner has difficulty with sex-has Viagra    Psychological Well-being:  Frequent feelings of sadness  Difficulty concentrating  Irritability  Crying spells  Change in sleep patterns  Lack of energy/motivation  Does not handle stress well  Frustrated by weight gain, would like to lose weight    Past Medical History:   Diagnosis Date    Allergic rhinitis     Asthma     DVT (deep venous thrombosis) (CMS-HCC)     HTN (hypertension)     Kidney disease, medullary sponge     Kidney stones     Pneumonia     Thyroid disease     Trigeminal neuralgia     UTI (urinary tract infection)        Past Surgical History:   Procedure Laterality Date    ANTERIOR CRUCIATE LIGAMENT REPAIR      appendix      HYSTERECTOMY  KNEE SURGERY Bilateral     OSTEOTOMY      SHOULDER SURGERY         Current Outpatient Prescriptions on File Prior to Visit   Medication Sig Dispense Refill    amLODIPINE (NORVASC) 5 MG tablet Take 5 mg by mouth daily.       carBAMazepine (TEGRETOL) 200 MG tablet Take 1 tablet (200 mg) by mouth daily. 90 tablet 1    Cholecalciferol (VITAMIN D PO)       fluticasone-salmeterol (ADVAIR) 100-50 MCG/DOSE inhaler Inhale 1 puff by mouth every 12 hours.      hydrochlorothiazide (HYDRODIURIL) 25 MG tablet Take 1 tablet (25 mg) by mouth daily. 30 tablet 5    HYDROcodone-acetaminophen (NORCO) 5-325 MG tablet Take 1 tablet by mouth every 6 hours as needed for Moderate Pain (Pain Score 4-6). 20 tablet 0    levalbuterol (XOPENEX HFA) 45 MCG/ACT inhaler Xopenex HFA 45 mcg/actuation aerosol inhaler      levothyroxine (SYNTHROID) 50 MCG  tablet Take 1 tablet (50 mcg) by mouth daily. 90 tablet 1    metFORMIN (GLUCOPHAGE XR) 500 MG XR tablet Take 2 tablets (1,000 mg) by mouth daily. 180 tablet 1    pimecrolimus (ELIDEL) 1 % cream Apply twice daily as needed for redness on face 30 g 3    VIVELLE-DOT 0.05 MG/24HR patch APPLY 1 PATCH ON SKIN TWICE WEEKLY 24 patch 0     No current facility-administered medications on file prior to visit.        Allergies   Allergen Reactions    Adhesive Tape Rash    Latex Rash    Morphine Shortness of Breath and Swelling    Nickel Rash     Swelling and rash    Petroleum Jelly [Petrolatum] Rash    Sulfa Drugs Rash     Related topical skin cream    Dilaudid  [Hydromorphone Hcl] Unspecified    Erythromycin Nausea Only    Oxycodone-Aspirin Unspecified       Family History   Problem Relation Age of Onset    Cancer Father     Heart Disease Father     Hypertension Father     Cholesterol/Lipid Disorder Father     Cancer Maternal Grandfather     Cancer Paternal Grandmother     Stroke Paternal Grandmother     Cancer Paternal Grandfather     Diabetes Paternal Grandfather     Melanoma Cancer Mother     Dementia Mother     Thyroid Mother     Other Mother      kidney disease    Cholesterol/Lipid Disorder Sister     Other Daughter      kidney disease       Social History     Social History    Marital status: Married     Spouse name: N/A    Number of children: N/A    Years of education: 4 Year      Social History Main Topics    Smoking status: Never Smoker    Smokeless tobacco: Never Used    Alcohol use Yes      Comment: Surveyor, mining     Drug use: No      Comment: denies    Sexual activity: Not Asked     Social Activities of Daily Living Present    None     Social History Narrative    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 merced)    Going to school for masters    married       Review of systems:   General: no fevers, chills, night  sweats, weight loss   Eyes: No visual changes   HEENT: No sore throat, no hearing change, no nosebleeds   Cardiovascular: No chest pain, no dyspnea on exertion, no orthopnea   Respiratory: No cough, no shortness of breath, no hemoptysis   Gastrointestinal: No abdominal pain, no nausea or vomiting, no dysphagia, no melena or hematochezia   Musculoskeletal: No arthralgias, no joint swelling   Skin: No rashes or changing skin lesions   Psych: +depression symptoms per HPI  Hematologic: No bruising or bleeding   Neuro: no headaches, visual changes, or speech changes  GU: no dysuria, urgency, or frequency; +urinary incontinence per HPI    EXAM:  Vitals:    02/10/17 0934   BP: 158/90   BP Location: Left arm   BP Patient Position: Sitting   BP cuff size: Large   Pulse: 104   Resp: 16   Temp: 98.3 F (36.8 C)   TempSrc: Temporal Artery   SpO2: 98%   Weight: 82.5 kg (181 lb 12.8 oz)       Physical Exam:  General Appearance: healthy, obese, alert, no distress, pleasant affect, cooperative.   Neck: Neck supple. No adenopathy, thyroid symmetric, normal size.   Heart: normal rate and regular rhythm, no murmurs, clicks, or gallops.   Lungs: clear to auscultation and percussion, no chest deformities noted.   Abdomen: BS normal. Abdomen soft, non-tender. No masses or organomegaly.   Extremities: no cyanosis, clubbing, or edema and distal pulses normal.     Labs/ Imaging:  Pap: status post hysterectomy for benign reasons  Mammogram: 12/14/16  Lab work:    Fasting glucose 99 in 8/18, A1C 5.3 in 12/17      A/p: Menopause consult:     Pt is a candidate for continuation of low dose transdermal estrogen only postmenopausal hormone therapy for treatment of moderate-severe vasomotor symptoms. I discussed with the patient that the current indication for using HT is for treatment of moderate to severe vasomotor symptoms that are bothersome to patient and to use lowest effective dose for relieving symptoms. Despite her risk factors of  hypertension and obesity, since she is status post hysterectomy and only needs estrogen, and receives the lowest dose transdermally, this therapy appears to have an acceptable level of risk.  Need for continuing use of Hormone Therapy will be re-assessed periodically, at least annually. Also increased risk of DVT/PE, and small increase in risk of stroke was discussed seen with oral therapy.    Rather than increase her dose of estrogen, since most of her symptoms are mood related, we will start her on Effexor 37.5 mg daily, which should help with vasomotor symptoms as well as mood. We discussed timing of onset for this medication.    I explained to pt that vaginal symptoms are typically treated with low dose topical estrogen therapy, rather than systemic therapy. She is interested in this, so will give a trial of Vagifem. We also discussed the possible utility of vaginal dilators given the type of dyspareunia she is describing and she will consider these. A list of lubricants was also provided.    For her low libido, we will assess her baseline testosterone level, and then may consider low dose custom compounded testosterone treatment.    Adequate calcium and  vitamin D supplementation and weight bearing exercise was recommended for osteoporosis prevention.     Patient was given educational handouts regarding the natural history of menopause, conservative treatment for hot flashes, and vaginal dryness as appropriate to her symptoms.     Rx given:  Vagifen 10 mcg biw  Effexor 37.5 mg daily  Continue Vivelle 0.025 mg patch biw for now  Will assess baseline testosterone, and estradiol on current therapy    F/U plan:  Return to clinic in 2 months  Address hypertension with Dr. Jannifer Franklin; BP possibly elevated today due to not taking HCTZ this morning. We have discussed lifestyle measures and to also check home readings.    Approximate time spent with the patient was 60 minutes of which >50% was spent counseling the  patient.

## 2017-02-13 LAB — TESTOSTERONE (FEMALE), BLOOD: Testosterone, Female: 15 ng/dL (ref 9–55)

## 2017-02-15 ENCOUNTER — Encounter (INDEPENDENT_AMBULATORY_CARE_PROVIDER_SITE_OTHER): Payer: Self-pay | Admitting: Internal Medicine

## 2017-02-15 DIAGNOSIS — R6882 Decreased libido: Principal | ICD-10-CM

## 2017-02-15 MED ORDER — MISCELLANEOUS COMPOUNDED MEDICATION - NON CONTROLLED
1 refills | Status: AC
Start: 2017-02-15 — End: ?

## 2017-02-16 ENCOUNTER — Institutional Professional Consult (permissible substitution) (INDEPENDENT_AMBULATORY_CARE_PROVIDER_SITE_OTHER): Payer: BC Managed Care – PPO | Admitting: Internal Medicine

## 2017-02-17 ENCOUNTER — Encounter (INDEPENDENT_AMBULATORY_CARE_PROVIDER_SITE_OTHER): Payer: BC Managed Care – PPO | Admitting: Optometrist

## 2017-02-19 ENCOUNTER — Other Ambulatory Visit (INDEPENDENT_AMBULATORY_CARE_PROVIDER_SITE_OTHER): Payer: Self-pay | Admitting: Family Medicine

## 2017-02-19 DIAGNOSIS — Z309 Encounter for contraceptive management, unspecified: Principal | ICD-10-CM

## 2017-02-19 MED ORDER — VIVELLE-DOT 0.05 MG/24HR TD PTTW
MEDICATED_PATCH | TRANSDERMAL | 0 refills | Status: DC
Start: 2017-02-19 — End: 2017-06-02

## 2017-03-03 ENCOUNTER — Ambulatory Visit (INDEPENDENT_AMBULATORY_CARE_PROVIDER_SITE_OTHER): Payer: BC Managed Care – PPO | Admitting: Ophthalmology

## 2017-03-19 ENCOUNTER — Ambulatory Visit (HOSPITAL_BASED_OUTPATIENT_CLINIC_OR_DEPARTMENT_OTHER): Payer: BC Managed Care – PPO | Admitting: Urology

## 2017-04-06 ENCOUNTER — Encounter (INDEPENDENT_AMBULATORY_CARE_PROVIDER_SITE_OTHER): Payer: Self-pay

## 2017-04-06 NOTE — Telephone Encounter (Signed)
2nd attempt, Left message patient to callback.  GI unable to contact letter sent out.

## 2017-04-19 ENCOUNTER — Encounter (INDEPENDENT_AMBULATORY_CARE_PROVIDER_SITE_OTHER): Payer: Self-pay | Admitting: Internal Medicine

## 2017-04-19 DIAGNOSIS — J4520 Mild intermittent asthma, uncomplicated: Principal | ICD-10-CM

## 2017-04-21 ENCOUNTER — Encounter (INDEPENDENT_AMBULATORY_CARE_PROVIDER_SITE_OTHER): Payer: Self-pay | Admitting: Internal Medicine

## 2017-04-21 ENCOUNTER — Encounter (INDEPENDENT_AMBULATORY_CARE_PROVIDER_SITE_OTHER): Payer: BC Managed Care – PPO | Admitting: Internal Medicine

## 2017-04-21 MED ORDER — FLUTICASONE-SALMETEROL 100-50 MCG/DOSE IN MISC
1.0000 | INHALATION_SPRAY | Freq: Two times a day (BID) | RESPIRATORY_TRACT | 2 refills | Status: DC
Start: 2017-04-21 — End: 2017-10-16

## 2017-04-21 MED ORDER — LEVALBUTEROL TARTRATE 45 MCG/ACT IN AERO
2.0000 | INHALATION_SPRAY | Freq: Four times a day (QID) | RESPIRATORY_TRACT | 3 refills | Status: DC | PRN
Start: 2017-04-21 — End: 2017-06-17

## 2017-04-21 NOTE — Telephone Encounter (Signed)
From: Delmer Islam  To: Berta Minor, MD  Sent: 04/19/2017 10:32 AM PST  Subject: 1-Non Urgent Medical Advice    Good Morning Dr Jannifer Franklin,    I am having a flair up of asthma symptoms, which is typical for this time of year. I need a refill of my Asthma medicine. We spoke about changing the dose of the Advair to a higher dose, not sure if you wanted to do that now. I left my rescue inhaler at a relatives house during the holiday and need to get another one. Could you please send an RX for both meds to my pharmacy asap.    Thank you,    Delmer Islam

## 2017-04-21 NOTE — Telephone Encounter (Signed)
From: Melanie Walsh  To: Berta Minor, MD  Sent: 04/21/2017 10:24 AM PST  Subject: 1-Non Urgent Medical Advice    right now I'm using the rescue inhaler about once a day which is pretty typical when it's cold and damp. When the weather warms up I rarely use any of the meds.     Will you be calling in an RX for both meds?    Thank you    [name]  ----- Message -----  From: Berta Minor, MD  Sent: 04/21/17, 9:00 AM  To: Melanie Walsh  Subject: RE: 1-Non Urgent Medical Advice    We would adjust the dose of the advair depending on how often you are needing the xopenex in addition to the current dose of the advair. You can keep me posted.  Take care,  Dr. Jannifer Franklin      ----- Message -----   From: Melanie Walsh   Sent: 04/19/2017 10:32 AM PST   To: Berta Minor, MD  Subject: 1-Non Urgent Medical Advice    Good Morning Dr Jannifer Franklin,    I am having a flair up of asthma symptoms, which is typical for this time of year. I need a refill of my Asthma medicine. We spoke about changing the dose of the Advair to a higher dose, not sure if you wanted to do that now. I left my rescue inhaler at a relatives house during the holiday and need to get another one. Could you please send an RX for both meds to my pharmacy asap.    Thank you,    Melanie Walsh

## 2017-04-21 NOTE — Telephone Encounter (Signed)
Mychart sent re: rx.

## 2017-05-20 ENCOUNTER — Telehealth (HOSPITAL_BASED_OUTPATIENT_CLINIC_OR_DEPARTMENT_OTHER): Payer: Self-pay | Admitting: Urology

## 2017-05-20 NOTE — Telephone Encounter (Signed)
Pt is calling in regards to returning phone call to nurse and is requesting a call back.      Best call back number  [phone] (H)

## 2017-05-20 NOTE — Telephone Encounter (Signed)
RN triage. Pt states:  Intermittent blood in urine.  Started in lower back pain, left pelvic pain on and off  It went away so I thought I passed the kidney stone  On Saturday it came back. Intermittent blood, pain, fever, chills  I am off tomorrow and would like to be evaluated if I can     Denies: dysuria, cloudy urine/ malodorous, current fever    LOV 12/18/2016 with Dr. Enrigue Catena Ramey is a 57 year old female with a history of urolithiasis who presents for routine follow-up.    Plan:  Per Apolonio Schneiders PA, okay to overbook pt for tomorrow  Advise if sx worsens to go to local ER.  Pt appreciative and verbalized understanding of instructions.    Future Schedule:    Future Appointments   Date Time Provider [place]   05/21/2017 11:00 AM Mahala Menghini, PA [facility]     Please reply to appropriate pool, as triage nurse is a float and messages might not be seen in a timely manner. Thank you!

## 2017-05-20 NOTE — Telephone Encounter (Signed)
LM for pt to return call to clinic to discuss sx.

## 2017-05-20 NOTE — Telephone Encounter (Signed)
Patient  is calling to inform provider they're experiencing symptoms    Last Office Visit: 12/18/16    Next Office Visit:     What are the symptoms:  Lower back pain, left pelvic pain, blood in the urine, nausea, chills     Pain level 0-10:   11/10    When did the symptoms start: 4 weeks worse now    Where is the pain located:  Back and pelvis    Is there any bleeding:  yes    Best Call Back #  [phone]    Best Call back time:  anytimeafter 1 pm     Is it OK to leave a voicemail: yes

## 2017-05-21 ENCOUNTER — Telehealth (HOSPITAL_BASED_OUTPATIENT_CLINIC_OR_DEPARTMENT_OTHER): Payer: Self-pay | Admitting: Surgical

## 2017-05-21 ENCOUNTER — Ambulatory Visit (HOSPITAL_BASED_OUTPATIENT_CLINIC_OR_DEPARTMENT_OTHER): Payer: BC Managed Care – PPO | Admitting: Surgical

## 2017-05-21 ENCOUNTER — Encounter (HOSPITAL_BASED_OUTPATIENT_CLINIC_OR_DEPARTMENT_OTHER): Payer: Self-pay | Admitting: Surgical

## 2017-05-21 ENCOUNTER — Ambulatory Visit
Admission: RE | Admit: 2017-05-21 | Discharge: 2017-05-21 | Disposition: A | Discharge status: Home / Self Care | Payer: BC Managed Care – PPO | Attending: Body Imaging | Admitting: Body Imaging

## 2017-05-21 VITALS — BP 158/93 | HR 91 | Temp 98.7°F | Ht 63.0 in | Wt 188.0 lb

## 2017-05-21 DIAGNOSIS — R109 Unspecified abdominal pain: Secondary | ICD-10-CM

## 2017-05-21 DIAGNOSIS — N200 Calculus of kidney: Principal | ICD-10-CM

## 2017-05-21 DIAGNOSIS — N281 Cyst of kidney, acquired: Principal | ICD-10-CM | POA: Insufficient documentation

## 2017-05-21 DIAGNOSIS — N202 Calculus of kidney with calculus of ureter: Secondary | ICD-10-CM | POA: Insufficient documentation

## 2017-05-21 LAB — URINALYSIS
Bilirubin: NEGATIVE
Blood: NEGATIVE
Glucose: NEGATIVE
Ketones: NEGATIVE
Leuk Esterase: NEGATIVE
Nitrite: NEGATIVE
Protein: NEGATIVE
Specific Gravity: 1.024 (ref 1.002–1.030)
Urobilinogen: NEGATIVE
pH: 6 (ref 5.0–8.0)

## 2017-05-21 MED ORDER — TAMSULOSIN HCL 0.4 MG PO CAPS
0.40 mg | ORAL_CAPSULE | Freq: Every day | ORAL | 1 refills | Status: AC
Start: 2017-05-21 — End: ?

## 2017-05-21 MED ORDER — OXYCODONE-ACETAMINOPHEN 5-325 MG OR TABS
1.00 | ORAL_TABLET | Freq: Four times a day (QID) | ORAL | 0 refills | Status: AC | PRN
Start: 2017-05-21 — End: ?

## 2017-05-21 NOTE — Telephone Encounter (Signed)
Hello Melanie Walsh      Patient was seen today and Imaging was order as stat, patient called Radiology to schedule for today, was inform that the Radiologist would have to review the order, patient state she drives 2 hours to get here, was wondering if you can contact the Radiologist to she can have the Imaging done today. Please advise.

## 2017-05-21 NOTE — Progress Notes (Signed)
Urology Follow-up Stone Note    Assessment/Plan:  L renal colic  Recurrent NL    I addressed urolithiasis with the patient today. I have personally reviewed the images, radiology report, labs, and old records. Recommend CT scan to assess for passing stone (previously was stone free on the left).  Will start on flomax for MET.  Pt to come back in 2 weeks to assess for stone passage.  If she has passed her stone, next step will be to start on potassium citrate.  Plan for imaging and medical expulsive therapy.    Plan:  Stat NCCT  Urinalysis  Urine culture  flomax and percocet  RTO in 2 weeks       Chief Complaint  - Nephrolithiasis     HPI  Melanie Walsh is a 57 year old female with a history of urolithiasis who presents for routine follow-up.    She reports one symptomatic stone event since her last visit. These were associated with abdominal pain. Since her last visit, Melanie Walsh underwent no interval procedures for stones. At their last visit, the plan was dietary changes.     Pt states that 1 month ago she started to have L flank pain.  2 weeks ago pain became severe overnight and then symptosm resolved.  Last Saturday had another episode of severe pain in the LLQ, took some T#3.  Since then she has not had severe pain but hurts most when she urinates and hurts also when she is sitting down.  Intermittent gross hematuria.  Urinary urgency/frequency.  Has had some chills this week, no measured fever.  Also bad nausea this week.     Melanie Walsh was started on no new medications at her  last visit. The patient reports n/a - no new medications were prescribed prescribed. Dietary changes recommended at the patient's last visit were Increasing fluid intake, Reducing oxalates in their diet and Reducing the amount of animal protein at each meal. Dietary change adherence: She has been adhering to recommendations.     Allergies   Allergen Reactions    Adhesive Tape Rash    Latex Rash    Morphine Shortness of Breath and Swelling     Nickel Rash     Swelling and rash    Petroleum Jelly [Petrolatum] Rash    Sulfa Drugs Rash     Related topical skin cream    Dilaudid  [Hydromorphone Hcl] Unspecified    Erythromycin Nausea Only    Oxycodone-Aspirin Unspecified     Current Outpatient Medications   Medication Sig Dispense Refill    amLODIPINE (NORVASC) 5 MG tablet Take 5 mg by mouth daily.       carBAMazepine (TEGRETOL) 200 MG tablet Take 1 tablet (200 mg) by mouth daily. 90 tablet 1    Cholecalciferol (VITAMIN D PO)       estradiol (VAGIFEM) 10 MCG vaginal tabs Insert 1 tablet (10 mcg) vaginally twice a week. 8 tablet 3    fluticasone-salmeterol (ADVAIR) 100-50 MCG/DOSE inhaler Inhale 1 puff by mouth every 12 hours. 1 Inhaler 2    hydrochlorothiazide (HYDRODIURIL) 25 MG tablet Take 1 tablet (25 mg) by mouth daily. 30 tablet 5    levalbuterol (XOPENEX HFA) 45 MCG/ACT inhaler Inhale 2 puffs by mouth every 6 hours as needed for Wheezing. 1 Inhaler 3    levothyroxine (SYNTHROID) 50 MCG tablet Take 1 tablet (50 mcg) by mouth daily. 90 tablet 1    metFORMIN (GLUCOPHAGE XR) 500 MG XR tablet Take 2 tablets (  1,000 mg) by mouth daily. 180 tablet 1    miscellaneous compounded medication Testosterone 1% cream, apply 0.5 g to skin of forearm, lower abdomen or thigh qam as directed 45 g 1    oxyCODONE-acetaminophen (PERCOCET) 5-325 MG tablet Take 1 tablet by mouth At bedtime as needed for Severe Pain (Pain Score 7-10).      pimecrolimus (ELIDEL) 1 % cream Apply twice daily as needed for redness on face 30 g 3    venlafaxine (EFFEXOR XR) 37.5 MG XR capsule Take 1 capsule (37.5 mg) by mouth daily. 30 capsule 3    VIVELLE-DOT 0.05 MG/24HR patch APPLY 1 PATCH ON SKIN TWICE WEEKLY 24 patch 0     No current facility-administered medications for this visit.      Past Medical History  Past Medical History:   Diagnosis Date    Allergic rhinitis     Asthma     DVT (deep venous thrombosis) (CMS-HCC)     HTN (hypertension)     Kidney disease,  medullary sponge     Kidney stones     Pneumonia     Thyroid disease     Trigeminal neuralgia     UTI (urinary tract infection)      Patient Active Problem List   Diagnosis    Nephrolithiasis    Medullary sponge kidney    Trigeminal neuralgia    Hypothyroidism, unspecified type    Essential hypertension    Recurrent UTI       Past Surgical History  Past Surgical History:   Procedure Laterality Date    ANTERIOR CRUCIATE LIGAMENT REPAIR      appendix      HYSTERECTOMY      KNEE SURGERY Bilateral     OSTEOTOMY      SHOULDER SURGERY       Family History  Family History   Problem Relation Age of Onset    Cancer Father     Heart Disease Father     Hypertension Father     Cholesterol/Lipid Disorder Father     Cancer Maternal Grandfather     Cancer Paternal Grandmother     Stroke Paternal Grandmother     Cancer Paternal Grandfather     Diabetes Paternal Grandfather     Melanoma Cancer Mother     Dementia Mother     Thyroid Mother     Other Mother         kidney disease    Cholesterol/Lipid Disorder Sister     Other Daughter         kidney disease     Social History  Social History     Socioeconomic History    Marital status: Married     Spouse name: Not on file    Number of children: Not on file    Years of education: 4 Year     Highest education level: Not on file   Occupational History    Not on file   Tobacco Use    Smoking status: Never Smoker    Smokeless tobacco: Never Used   Substance and Sexual Activity    Alcohol use: Yes     Comment: Occasioanl     Drug use: No     Comment: denies    Sexual activity: Not on file   Social Activities of Daily Living Present    Not on file   Social History Narrative    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 All other systems reviewed and found negative.      Objective  Physical Exam  Vitals:    05/21/17 1055   BP: (!) 158/93   BP Location: Left arm   BP Patient Position: Sitting   BP cuff size: Regular   Pulse: 91   Temp: 98.7 F (37.1 C)   TempSrc: Temporal   Weight: 85.3 kg (188 lb)   Height: '5\' 3"'  (1.6 m)     GENERAL: The patient is an alert, cooperative female in no acute distress.   HEENT: normalocephalic/atraumatic  NECK: midline trachea. No jugular venous distention  PULM: no evidence of labored breathing  GI: soft, ND, no masses.  LLQ pain.  No hepatosplenomegaly  BACK: No CVAT  EXTREMITIES: Joints are normal without redness or swelling.   SKIN: There is no rash or cyanosis.   NEURO: no motor or sensory deficits.  Alert and Oriented x 3    Lab Review: The following lab results were personally reviewed by me:  BMP Results Latest Ref Rng & Units 11/27/2016 07/19/2016 07/18/2016   GLU 70 - 99 mg/dL 99 103(H) 100(H)   BUN 6 - 20 mg/dL 21(H) 21(H) 16   CREAT 0.51 - 0.95 mg/dL 0.77 0.85 0.81   GFRNON mL/min >60 >60 >60   NA 136 - 145 mmol/L 140 143 143   K 3.5 - 5.1 mmol/L 4.3 3.7 4.1   CL 98 - 107 mmol/L 97(L) 102 106   BICARB 22 - 29 mmol/L '29 29 27   ' ANION 7 - 15 mmol/L '14 12 10   ' [place] 8.5 - 10.6 mg/dL 10.0 9.0 8.5     CBC Results Latest Ref Rng & Units 07/19/2016 07/18/2016 07/17/2016   WBC 4.0 - 10.0 1000/mm3 5.8 6.2 6.5   RBC 3.90 - 5.20 mill/mm3 4.10 3.73(L) 3.95   HGB 11.2 - 15.7 gm/dL 13.6 12.4 13.1   HCT 34.0 - 45.0 % 40.1 37.0 38.7   PLT 140 - 370 1000/mm3 224 216 237   No flowsheet data found.    24-hour urine collection - this was personally reviewed and results were discussed with the patient. It demonstrates the following:    [facility] Results Latest Ref Rng & Units 05/12/2016 05/12/2016 11/20/2014   VOL 0.5 - 5 Liters/Day 1.5 1.5 0.9   SS CAOX 6 - 10 12.5(A) 12.5(A) 14.7(A)   [place] 0 - 200 mg/day 175 175 236(A)   OXALATE 20 - 40 mg/day 61.0(A) 61.0(A) 32.0   CITRATE 550 mg/day 753 753 456(A)   SS CaP 0.5 - 2 0.6 0.6 1.2   pH 5.8 - 6.2 5.7(A) 5.7(A) 5.6(A)   SS  URIC 0 - '1 1 1 ' 2(A)   URIC ACID CONC 0 - 0.750 g/day 0.473 0.473 0.551   NA 50 - 150 mmol/day 168(A) 168(A) 126   K 20 - 100 mmol/day 74 74 46   MG 30 - 120 mg/day 110 110 85   PHOS 0.6 - 1.2 g/day 0.7 0.7 0.8   NH4 15 - 60 mmol/day '29 29 30   ' CHOL 70 - 250 mmol/day 205 205 126   SULF 20 - 80 mEq/day 23 23 38   UREA 6 - 14 g/day 6.7 6.7 10.3   PCR 0.8 - 1.4 g/kg/day 0.7(A)  0.7(A) 1.1   CREAT mg/day 1,093.0 1,093.0 1,289.0   CR/KG 15 - 20 mg/kg/day 14.2(A) 14.2(A) 18.3   [place]/KG 0 - 4.00 mg/kg/day 2.30 2.30 3.40   [place]/CR 0 - 140 mg/g 160(A) 160(A) 183(A)       Imaging  The following imaging was personally reviewed and the findings were discussed with the patient.    Stone and Imaging 12/18/2016   [facility]   Imaging Date 12/14/2016   Image Type CT abdomen/pelvis   Was there an increase in stone burden compared to previous imaging? -   Number of Stones - Right 1   Size of Stones - Right 2   Location of Stones - Right upper pole   Hydronephrosis Severity - Right -   Description - Right -   Number of Stones - Left 0   Size of Stones - Left -   Location of Stone - Left -   Hydronephrosis Severity - Left -   Description - Left -       Mahala Menghini, PA

## 2017-05-21 NOTE — Telephone Encounter (Signed)
Pt had imaging scheduled and completed today.  Notable for tiny L UVJ stone.  Called pt inform her.  Plan remains for MET with follow up in 2 weeks.

## 2017-05-23 LAB — URINE CULTURE: Urine Culture Result: NO GROWTH

## 2017-05-25 ENCOUNTER — Other Ambulatory Visit (INDEPENDENT_AMBULATORY_CARE_PROVIDER_SITE_OTHER): Payer: Self-pay | Admitting: Internal Medicine

## 2017-05-25 DIAGNOSIS — E039 Hypothyroidism, unspecified: Secondary | ICD-10-CM

## 2017-05-25 DIAGNOSIS — E1169 Type 2 diabetes mellitus with other specified complication: Secondary | ICD-10-CM

## 2017-05-25 DIAGNOSIS — Z0189 Encounter for other specified special examinations: Principal | ICD-10-CM

## 2017-05-26 MED ORDER — LEVOTHYROXINE SODIUM 50 MCG OR TABS
ORAL_TABLET | ORAL | 1 refills | Status: DC
Start: 2017-05-26 — End: 2017-11-25

## 2017-05-26 MED ORDER — METFORMIN HCL 500 MG OR TB24
ORAL_TABLET | ORAL | 1 refills | Status: DC
Start: 2017-05-26 — End: 2017-11-25

## 2017-05-26 NOTE — Telephone Encounter (Signed)
Prescription Refill Request     Name of PCP Provider: Berta Minor   Last office visit: 12/23/2016  Next office visit: Visit date not found    Requested Prescriptions     Pending Prescriptions Disp Refills    metFORMIN (GLUCOPHAGE XR) 500 MG XR tablet [Pharmacy Med Name: METFORMIN ER 500MG  24HR TABS] 180 tablet 0     Sig: TAKE 2 TABLETS(1000 MG) BY MOUTH DAILY    levothyroxine (SYNTHROID) 50 MCG tablet [Pharmacy Med Name: LEVOTHYROXINE 0.05MG  (50MCG) TAB] 90 tablet 0     Sig: TAKE 1 TABLET(50 MCG) BY MOUTH DAILY       Last Refill: no dates found    Send to:     [facility], [place] - [address] AT [place]  [place]  [place] [address]  Phone: [phone] Fax: [phone]    Lab Results   Component Value Date    NA 140 11/27/2016    K 4.3 11/27/2016    CL 97 (L) 11/27/2016    BICARB 29 11/27/2016    BUN 21 (H) 11/27/2016    CREAT 0.77 11/27/2016    GLU 99 11/27/2016    [place] 10.0 11/27/2016     Lab Results   Component Value Date    A1C 5.3 03/28/2016     Lab Results   Component Value Date    TSH 1.28 02/10/2017

## 2017-06-02 ENCOUNTER — Other Ambulatory Visit (INDEPENDENT_AMBULATORY_CARE_PROVIDER_SITE_OTHER): Payer: Self-pay | Admitting: Family Medicine

## 2017-06-02 DIAGNOSIS — Z309 Encounter for contraceptive management, unspecified: Principal | ICD-10-CM

## 2017-06-02 MED ORDER — VIVELLE-DOT 0.05 MG/24HR TD PTTW
MEDICATED_PATCH | TRANSDERMAL | 0 refills | Status: DC
Start: 2017-06-02 — End: 2017-08-29

## 2017-06-04 ENCOUNTER — Ambulatory Visit: Payer: BC Managed Care – PPO | Attending: Urology | Admitting: Urology

## 2017-06-04 ENCOUNTER — Encounter (HOSPITAL_BASED_OUTPATIENT_CLINIC_OR_DEPARTMENT_OTHER): Payer: Self-pay | Admitting: Urology

## 2017-06-04 ENCOUNTER — Other Ambulatory Visit (INDEPENDENT_AMBULATORY_CARE_PROVIDER_SITE_OTHER): Payer: BC Managed Care – PPO

## 2017-06-04 VITALS — BP 156/93 | HR 95 | Temp 99.0°F | Resp 16 | Ht 63.0 in | Wt 188.0 lb

## 2017-06-04 DIAGNOSIS — N200 Calculus of kidney: Principal | ICD-10-CM

## 2017-06-04 LAB — 24 HOUR URINE LITHOLINK TEST
CA 24/KG, 24 Hour Urine - External: 2.7 mg/kg/day (ref 0–4.00)
CR 24/kg, 24 Hour Urine - External: 15.4 mg/kg/day (ref 15–20)
Ca 24/Cr 24, 24 Hour Urine - External: 177 mg/g — AB (ref 0–140)
Calcium, 24 Hour Urine - External: 210 mg/d — AB (ref 0–200)
Chloride, 24 Hour Urine - External: 173 mmol/day (ref 70–250)
Citrate, 24 Hour Urine - External: 939 mg/d (ref 550–?)
Creatinine, 24 Hour Urine - External: 1188 mg/d
Magnesium, 24 Hour Urine - External: 98 mg/d (ref 30–120)
NH4, 24 Hour Urine - External: 30 mmol/day (ref 15–60)
Oxalate, 24 Hour Urine - External: 32 mg/d (ref 20–40)
PCR, 24 Hour Urine- External: 1.1 g/kg/day (ref 0.8–1.4)
Phosphorus, 24 Hour Urine - External: 0.9 g/day (ref 0.6–1.2)
Potassium, 24 Hur Urine - External: 69 mmol/day (ref 20–100)
SS Ca Ox, 24 Hour Urine - External: 5.9 — AB (ref 6–10)
SS CaP, 24 Hour Urine - External: 1.9 (ref 0.5–2)
SS UA, 24 Hour Urine - External: 0 (ref 0–1)
Sodium, 24 Hour Urine - External: 183 mmol/day — AB (ref 50–150)
Sulfate, 24 Hour Urine - External: 44 mEq/day (ref 20–80)
Urea Nitrogen, 24 Hour Urine - External: 11 g/day (ref 6–14)
Uric Acid, 24 Hour Urine - External: 0.75 g/day (ref 0–0.750)
Volume, 24 Hour Urine - External: 1.7 Liters/Day (ref 0.5–5)
pH, 24 Hour Urine - External: 6.5 — AB (ref 5.8–6.2)

## 2017-06-04 LAB — BASIC METABOLIC PANEL, BLOOD
Anion Gap: 14 mmol/L (ref 7–15)
BUN: 13 mg/dL (ref 6–20)
Bicarbonate: 27 mmol/L (ref 22–29)
Calcium: 9.8 mg/dL (ref 8.5–10.6)
Chloride: 100 mmol/L (ref 98–107)
Creatinine: 0.74 mg/dL (ref 0.51–0.95)
GFR: >60 mL/min
Glucose: 104 mg/dL — ABNORMAL HIGH (ref 70–99)
Potassium: 3.9 mmol/L (ref 3.5–5.1)
Sodium: 141 mmol/L (ref 136–145)

## 2017-06-04 MED ORDER — ACETAMINOPHEN-CODEINE #3 300-30 MG OR TABS (CUSTOM)
2.0000 | ORAL_TABLET | Freq: Four times a day (QID) | ORAL | 0 refills | Status: DC | PRN
Start: 2017-06-04 — End: 2017-06-17

## 2017-06-04 MED ORDER — POTASSIUM CITRATE ER 15 MEQ (1620 MG) PO TBCR
15.0000 meq | EXTENDED_RELEASE_TABLET | Freq: Two times a day (BID) | ORAL | 3 refills | Status: AC
Start: 2017-06-04 — End: ?

## 2017-06-04 MED ORDER — OXYBUTYNIN CHLORIDE 5 MG OR TABS
5.0000 mg | ORAL_TABLET | Freq: Three times a day (TID) | ORAL | 0 refills | Status: DC | PRN
Start: 2017-06-04 — End: 2017-06-15

## 2017-06-04 MED ORDER — PHENAZOPYRIDINE HCL 200 MG OR TABS
200.00 mg | ORAL_TABLET | Freq: Three times a day (TID) | ORAL | 0 refills | Status: AC | PRN
Start: 2017-06-04 — End: ?

## 2017-06-04 MED ORDER — TAMSULOSIN HCL 0.4 MG PO CAPS
0.40 mg | ORAL_CAPSULE | Freq: Every day | ORAL | 0 refills | Status: AC
Start: 2017-06-04 — End: ?

## 2017-06-04 NOTE — Patient Instructions (Signed)
1. Call Sallie to schedule surgery [phone]  2. Start taking Flomax (tamsulosin) 7days before surgery date and 7 days after surgery  3. All other meds are for post-operative use      PRE-OPERATIVE INSTRUCTIONS     1. For 7 days prior to surgery, please do not take any medications that contain aspirin, ibuprofen, or any non-steroidal anti-inflammatory medications.  If you must take pain medication, please take Tylenol or an acetaminophen substitute.  2. If you take prescription medications, check with the Anesthesiologist or the nurse in the Evaluation Center about whether you should take your medications on the day of surgery, especially blood pressure and heart medications.    3. If you smoke, do not smoke for at least 24 hours prior to surgery.  Please be aware that [facility] is a non-smoking facility.  4. Wear comfortable, loose clothing to the hospital.  5. Leave all valuables at home.  This includes jewelry, credit cards, money (except for copayment for discharge medications).  6. ALL jewelry must be removed, including rings, earrings, necklaces, navel rings, etc.  7. If you wear contact lenses or glasses, bring a case for them.  Contact lenses must be removed prior to surgery.  8. Bring your insurance carrier cards and picture I.D. with you.  If your insurance plan is accepted at our [place] and you would like to have your discharge prescriptions filled there, you will need to bring money for your co-payment.  9. Please arrange for transportation home after your surgery and hospital stay. A taxi or shuttle bus is not acceptable! You will need to be accompanied home by a responsible adult.  You may want to have a pillow and/or blanket in the car for the ride home.  10. If you will be staying in the hospital, bring a robe, slippers and any items for grooming that you may need during your stay.   11. Please call your doctor if you have any questions regarding your surgery or if you develop  any signs or symptoms of illness (fever, runny nose, cough, sore throat).     NOTE:   Your surgery may have to be cancelled if you:    1) You do not follow the fasting/bowel prep guidelines    2) You arrive late for check in at the hospital  3) You have not arranged for a responsible adult to accompany you home      [place]. Sur  Professor of Urology  Director, [place]  Appts: [phone] Fax: [place] of Urology  [facility]  [address] [place], [place], [place] [address]  [phone]  [place] Location: [phone]    Mailing Address:  [address], [id number]  [place], [place]    Veda Canning, Surgery Scheduler  T: [phone] E: [email or web address]      To schedule a routine follow up appointment:  Urology Scheduling: [phone]    Please contact the [facility] Scheduling line at [phone] to schedule your [place], CT Scans or MRI studies.    AFTER HOURS EMERGENCY NUMBER  [phone]                      Ask for the Urologist/Oncologist Physician On-Call.

## 2017-06-04 NOTE — Addendum Note (Signed)
Addended byNell Range on: 06/04/2017 04:47 PM     Modules accepted: Orders

## 2017-06-04 NOTE — Progress Notes (Signed)
Urology Follow-up Stone Note    Assessment/Plan:  R/o bilateral distal ureteral calculi- I think that she passed her left distal ureteral calculus but still has a right ureteral calculus given the bedside hydronephrosis that I saw.  Patient wishes to be rendered stone free and therefore will perform bilateral ureteroscopy lithotripsy.    Nephrolithiasis due to Low Volume urine, mild Hypercalciuria, mild Excess dietary sodium, mild Hypocitraturia, Hyperuricosuria    Continue HCTZ 25 mg for blood pressure and add Urocit-K 15 mg b.i.d..  She should continue to drink at least 100 oz of fluid per day.    I addressed urolithiasis with the patient today. I have personally reviewed the images, radiology report, labs, and old records. Plan for surgery.     Start Flomax 7days prior to surgery and send Ucx.  Explained the risks, benefits and alternatives (SWL/PNL). The risks include but are not limited to infection, urosepsis, bleeding, transfusion, ureteral perforation with need for nephrostomy tube, open conversion, need for repeat or ancillary procedure, ureteral stricture, cardiac events/MI, DVT/PE, CVA/stroke, pneumonia, and death.    Chief Complaint  - Nephrolithiasis     HPI  Melanie Walsh is a 57 year old female with a history of urolithiasis who presents for routine follow-up w CT to eval.  Since seeing Melanie Walsh 2 weeks ago patient now has new onset right-sided flank pain and thinks she is passing stone on the right side.  She no longer feels left-sided flank pain and thinks she may have passed her stone.  Denies fevers nausea vomiting or gross hematuria.    She really wishes to be rendered stone free and opened to taking medications to prevent stones.  She is currently taking HCTZ 25 mg a day for blood pressure.      Bedside US  Bedside ultrasound shows multiple hyperechoic lesions on the right kidney consistent with her known small right renal calculi.  The kidney has mild hydronephrosis.  The left kidney shows no  evidence of hydronephrosis and measures 10.5 x 5 cm.    Right pyelonephritis, refractory ESBL    S/p Left URS 03/30/2016  S/p Left Bilateral URS/litho 03/26/2016    Old Note:  Pt states that 1 month ago she started to have L flank pain.  2 weeks ago pain became severe overnight and then symptosm resolved.  Last Saturday had another episode of severe pain in the LLQ, took some T#3.  Since then she has not had severe pain but hurts most when she urinates and hurts also when she is sitting down.  Intermittent gross hematuria.  Urinary urgency/frequency.  Has had some chills this week, no measured fever.  Also bad nausea this week.       05/21/2017 CT              She reports one symptomatic stone event since her last visit. These were associated with right flank pain. Since her last visit, Melanie Walsh underwent no interval procedures for stones. At their last visit, the plan was medical expulsive therapy.     Melanie Walsh was started on tamsulosin at her  last visit. The patient reports n/a - no new medications were prescribed prescribed. Dietary changes recommended at the patient's last visit were Increasing fluid intake and Reducing oxalates in their diet. Dietary change adherence: She has been adhering to recommendations.     Allergies   Allergen Reactions    Adhesive Tape Rash    Latex Rash    Morphine Shortness of Breath and Swelling  Nickel Rash     Swelling and rash    Petroleum Jelly [Petrolatum] Rash    Sulfa Drugs Rash     Related topical skin cream    Dilaudid  [Hydromorphone Hcl] Unspecified    Erythromycin Nausea Only    Oxycodone-Aspirin Unspecified     Current Outpatient Medications   Medication Sig Dispense Refill    amLODIPINE (NORVASC) 5 MG tablet Take 5 mg by mouth daily.       carBAMazepine (TEGRETOL) 200 MG tablet Take 1 tablet (200 mg) by mouth daily. 90 tablet 1    Cholecalciferol (VITAMIN D PO)       estradiol (VAGIFEM) 10 MCG vaginal tabs Insert 1 tablet (10 mcg) vaginally twice a week. 8 tablet 3     fluticasone-salmeterol (ADVAIR) 100-50 MCG/DOSE inhaler Inhale 1 puff by mouth every 12 hours. 1 Inhaler 2    hydrochlorothiazide (HYDRODIURIL) 25 MG tablet Take 1 tablet (25 mg) by mouth daily. 30 tablet 5    levalbuterol (XOPENEX HFA) 45 MCG/ACT inhaler Inhale 2 puffs by mouth every 6 hours as needed for Wheezing. 1 Inhaler 3    levothyroxine (SYNTHROID) 50 MCG tablet TAKE 1 TABLET(50 MCG) BY MOUTH DAILY 90 tablet 1    metFORMIN (GLUCOPHAGE XR) 500 MG XR tablet TAKE 2 TABLETS(1000 MG) BY MOUTH DAILY 180 tablet 1    miscellaneous compounded medication Testosterone 1% cream, apply 0.5 g to skin of forearm, lower abdomen or thigh qam as directed 45 g 1    oxyCODONE-acetaminophen (PERCOCET) 5-325 MG tablet Take 1 tablet by mouth every 6 hours as needed for Severe Pain (Pain Score 7-10). 20 tablet 0    oxyCODONE-acetaminophen (PERCOCET) 5-325 MG tablet Take 1 tablet by mouth At bedtime as needed for Severe Pain (Pain Score 7-10).      pimecrolimus (ELIDEL) 1 % cream Apply twice daily as needed for redness on face 30 g 3    tamsulosin (FLOMAX) 0.4 MG capsule Take 1 capsule (0.4 mg) by mouth daily. 30 capsule 1    venlafaxine (EFFEXOR XR) 37.5 MG XR capsule Take 1 capsule (37.5 mg) by mouth daily. 30 capsule 3    VIVELLE-DOT 0.05 MG/24HR patch APPLY 1 PATCH ON SKIN TWICE WEEKLY 24 patch 0     No current facility-administered medications for this visit.      Past Medical History  Past Medical History:   Diagnosis Date    Allergic rhinitis     Asthma     DVT (deep venous thrombosis) (CMS-HCC)     HTN (hypertension)     Kidney disease, medullary sponge     Kidney stones     Pneumonia     Thyroid disease     Trigeminal neuralgia     UTI (urinary tract infection)      Patient Active Problem List   Diagnosis    Nephrolithiasis    Medullary sponge kidney    Trigeminal neuralgia    Hypothyroidism, unspecified type    Essential hypertension    Recurrent UTI       Past Surgical History  Past Surgical  History:   Procedure Laterality Date    ANTERIOR CRUCIATE LIGAMENT REPAIR      appendix      HYSTERECTOMY      KNEE SURGERY Bilateral     OSTEOTOMY      SHOULDER SURGERY       Family History  Family History   Problem Relation Age of Onset    Cancer Father  Heart Disease Father     Hypertension Father     Cholesterol/Lipid Disorder Father     Cancer Maternal Grandfather     Cancer Paternal Grandmother     Stroke Paternal Grandmother     Cancer Paternal Grandfather     Diabetes Paternal Grandfather     Melanoma Cancer Mother     Dementia Mother     Thyroid Mother     Other Mother         kidney disease    Cholesterol/Lipid Disorder Sister     Other Daughter         kidney disease     Social History  Social History     Socioeconomic History    Marital status: Married     Spouse name: Not on file    Number of children: Not on file    Years of education: 4 Year     Highest education level: Not on file   Occupational History    Not on file   Tobacco Use    Smoking status: Never Smoker    Smokeless tobacco: Never Used   Substance and Sexual Activity    Alcohol use: Yes     Comment: Occasioanl     Drug use: No     Comment: denies    Sexual activity: Not on file   Social Activities of Daily Living Present    Not on file   Social History Narrative    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 All other systems reviewed and found negative.     Objective  Physical Exam  There were no vitals filed for this visit.  GENERAL: The patient is an alert, cooperative female in no acute distress.   HEENT: normalocephalic/atraumatic  NECK: midline trachea. No jugular venous distention  PULM: no evidence of labored breathing    EXTREMITIES: Joints are normal without redness or swelling.   SKIN: There is no edema or cyanosis.    NEURO: no motor or sensory deficits.  Alert and Oriented x 3    Lab Review: The following lab results were personally reviewed by me:  BMP Results Latest Ref Rng & Units 11/27/2016 07/19/2016 07/18/2016   GLU 70 - 99 mg/dL 99 103(H) 100(H)   BUN 6 - 20 mg/dL 21(H) 21(H) 16   CREAT 0.51 - 0.95 mg/dL 0.77 0.85 0.81   GFRNON mL/min >60 >60 >60   NA 136 - 145 mmol/L 140 143 143   K 3.5 - 5.1 mmol/L 4.3 3.7 4.1   CL 98 - 107 mmol/L 97(L) 102 106   BICARB 22 - 29 mmol/L 29 29 27    ANION 7 - 15 mmol/L 14 12 10    [place] 8.5 - 10.6 mg/dL 10.0 9.0 8.5     CBC Results Latest Ref Rng & Units 07/19/2016 07/18/2016 07/17/2016   WBC 4.0 - 10.0 1000/mm3 5.8 6.2 6.5   RBC 3.90 - 5.20 mill/mm3 4.10 3.73(L) 3.95   HGB 11.2 - 15.7 gm/dL 13.6 12.4 13.1   HCT 34.0 - 45.0 % 40.1 37.0 38.7   PLT 140 - 370 1000/mm3 224 216 237   No flowsheet data found.    24-hour urine collection - this was personally reviewed and results were discussed  with the patient. It demonstrates the following:    [facility] Results Latest Ref Rng & Units 05/12/2016 05/12/2016 11/20/2014   VOL 0.5 - 5 Liters/Day 1.5 1.5 0.9   SS CAOX 6 - 10 12.5(A) 12.5(A) 14.7(A)   [place] 0 - 200 mg/day 175 175 236(A)   OXALATE 20 - 40 mg/day 61.0(A) 61.0(A) 32.0   CITRATE 550 mg/day 753 753 456(A)   SS CaP 0.5 - 2 0.6 0.6 1.2   pH 5.8 - 6.2 5.7(A) 5.7(A) 5.6(A)   SS URIC 0 - 1 1 1  2(A)   URIC ACID CONC 0 - 0.750 g/day 0.473 0.473 0.551   NA 50 - 150 mmol/day 168(A) 168(A) 126   K 20 - 100 mmol/day 74 74 46   MG 30 - 120 mg/day 110 110 85   PHOS 0.6 - 1.2 g/day 0.7 0.7 0.8   NH4 15 - 60 mmol/day 29 29 30    CHOL 70 - 250 mmol/day 205 205 126   SULF 20 - 80 mEq/day 23 23 38   UREA 6 - 14 g/day 6.7 6.7 10.3   PCR 0.8 - 1.4 g/kg/day 0.7(A) 0.7(A) 1.1   CREAT mg/day 1,093.0 1,093.0 1,289.0   CR/KG 15 - 20 mg/kg/day 14.2(A) 14.2(A) 18.3   [place]/KG 0 - 4.00 mg/kg/day 2.30 2.30 3.40   [place]/CR 0 - 140 mg/g 160(A) 160(A) 183(A)       Imaging  The following imaging was personally reviewed and the findings were  discussed with the patient.    Stone and Imaging 12/18/2016   [facility]   Imaging Date 12/14/2016   Image Type CT abdomen/pelvis   Was there an increase in stone burden compared to previous imaging? -   Number of Stones - Right 1   Size of Stones - Right 2   Location of Stones - Right upper pole   Hydronephrosis Severity - Right -   Description - Right -   Number of Stones - Left 0   Size of Stones - Left -   Location of Stone - Left -   Hydronephrosis Severity - Left -   Description - Left -       Sherrian Divers, MD

## 2017-06-04 NOTE — Addendum Note (Signed)
Addended by: Vilinda Boehringer on: 06/04/2017 04:47 PM     Modules accepted: Orders

## 2017-06-06 LAB — URINE CULTURE: Urine Culture Result: NO GROWTH

## 2017-06-08 ENCOUNTER — Telehealth (HOSPITAL_BASED_OUTPATIENT_CLINIC_OR_DEPARTMENT_OTHER): Payer: Self-pay | Admitting: Urology

## 2017-06-08 NOTE — Telephone Encounter (Signed)
..     Surgery name: Ureteroscopy     Will send message to MD and RN to review orders (labs, imaging and cath removal orders) in EPIC to make sure appropriate orders are entered prior to pt's surgery. RN to also confirm with pt that they are aware of request.    Pt of Dr. Reather Converse would like to be seen sooner. I have scheduled with Dr. Carlena Bjornstad on 2/28. Pt is requesting a call back from Dr. Reather Converse to make sure this is okay and to discuss details as far as biopsy's. Pt also aware that Dr. Carlena Bjornstad would like to speak with pt prior, pt is requesting a call vice in clinic appt as she lives in [place]. Routing to Both MD's     Patient has been contacted at Ph. # [phone]. Spoke to patient and confirmed all surgery dates below.    Surgery Date: 2/28   Check in time: 7:10  [place]: None avail prior     Is Medical Clearance needed?  No     Surgical Consent has been signed and scanned into media    Auth:  Will request as time gets closer per auth teams request.        Is the patient taking any aspirin/blood thinning medication that is prescribed under a physician's instructions? No per Pt       PRE-OPERATIVE NPO BOWEL PREPARATION    As soon as you wake up on the [place], please have a bowel movement if possible before you leave for the hospital.     Take your regular medications on the [place] (except Aspirin, Coumadin or Plavix). If you regularly take any medication in the morning, especially insulin or other oral medication for diabetes you must discuss this with your doctor. Please make sure that your doctor approves all the medications that you are taking.    DO NOT eat or drink anything after midnight on the night before your surgery.    REMEMBER: You cannot take aspirin or other similar medications (Motrin, Ibuprofen, Naprosen) or blood thinners (Coumadin, Plavix) for 1 week prior to this procedure.      If you have any questions about this Bowel Preparation, please call the [facility] Oncology Nurse  Case Manager          ASPIRIN AND IBUPROFEN CAUTION SHEET  FOR SURGERY PATIENTS    For 7 days priorto surgery, do not take any medication containing aspirin or ibuprofen.Please refer to the list below for some of the medications that contain aspirin and/or ibuprofen. If it is necessary for you to take any medication, pleaseuse Tylenol or acetaminophen substitutes.    AdvilGingkoba  Alka-SeltzerLiquiprin  AlleveMeasurin  Anacin Midol  APC Motrin  ASA compound   Norgesic  Ascriptin  Novahistine with APC  Aspergum  Nuprin  Bufferin  PAC  CAMAPercodan  [place]ron capsules            Phenaphen  Contact Phensol  Cope Plavix  CoricidinRobaxisal  Counterpain Sal-Fayne  Daprisal Stanback  Darvon compound           Super Anahist  Dolene compound           Synalogos  Dristan Talwin compound  Ecotrin Trigesic  Edrisal Triphen  Equagesic Trilisate  Excedrin Triaminic  Femcaps Vanquish  Fiorinal   Vitamin E (or any other  multi-vitamin)  Gingkoba Zactirin    This list does not include every medication that contains aspirin or ibuprofen. Before taking any medication prior to or after surgery, please read the label carefully for the active ingredients aspirin, salicylates,and/or ibuprofen. If the medication contains these ingredients do not use. Please inform your physician of all medications you are taking, including non-prescription medications.    PRE-OPERATIVE INSTRUCTIONS    If there is any change in your surgery time, you will be contacted by the operating room scheduling team after 5:00 p.m. the day before your surgery   1. For 7 days prior to surgery, please do not take any medications that contain aspirin, ibuprofen, or any non-steroidal anti-inflammatory medications.If you must take pain medication, please take Tylenol or an acetaminophen substitute.  2. If you take prescription medications, check with the Anesthesiologist or the nurse in the Evaluation Center about whether you should take your medications on the day of surgery, especially blood pressure and heart medications.  3. If you smoke, do not smoke for at least 24 hours prior to  surgery. Please be aware that [facility] is a non-smoking facility.  4. Wear comfortable, loose clothing to the hospital.  5. Leave all valuables at home.This includes jewelry, credit cards, money (except for copayment for discharge medications).  6. ALL jewelry must be removed,including rings, earrings, necklaces, navel rings, etc.  7. If you wear contact lenses or glasses, bring a case for them.Contact lenses must be removed prior to surgery.  8. Bring your insurance carrier cards and picture I.D. with you.If your insurance plan is accepted at our [place] and you would like to have your discharge prescriptions filled there, you will need to bring money for your co-payment.  9. Please arrange for transportation home after your surgery and hospital stay. A taxi or shuttle bus is not acceptable! You will need to be accompanied home by a responsible adult.You may want to have a pillow and/or blanket in the car for the ride home.  10. If you will be staying in the hospital, bring a robe, slippers and any items for grooming that you may need during your stay.   11. Please call your doctor if you have any questions regarding your surgery or if you develop any signs or symptoms of illness (fever, runny nose, cough, sore throat).    NOTE:Your surgery may have to be cancelled if you:  1) You do not follow the fasting/bowel prep guidelines  2) You arrive late for check in at the hospital  3) You have not arranged for a responsible adult to accompany you home

## 2017-06-09 ENCOUNTER — Telehealth (HOSPITAL_BASED_OUTPATIENT_CLINIC_OR_DEPARTMENT_OTHER): Payer: Self-pay | Admitting: Urology

## 2017-06-09 NOTE — Telephone Encounter (Signed)
I will call her.

## 2017-06-09 NOTE — Telephone Encounter (Signed)
Spoke to patient and reviewed plan. We will definitely proceed with right ureteroscopy/holmium laser lithotripsy/ureteral stent, we will consider bilateral as well.  I will review with her again when I see her. She agrees to proceed as planned.

## 2017-06-11 ENCOUNTER — Other Ambulatory Visit (INDEPENDENT_AMBULATORY_CARE_PROVIDER_SITE_OTHER): Payer: Self-pay | Admitting: Internal Medicine

## 2017-06-11 DIAGNOSIS — Z780 Asymptomatic menopausal state: Principal | ICD-10-CM

## 2017-06-11 MED ORDER — VENLAFAXINE HCL 37.5 MG OR CP24
ORAL_CAPSULE | ORAL | 3 refills | Status: DC
Start: 2017-06-11 — End: 2017-10-20

## 2017-06-11 NOTE — Telephone Encounter (Signed)
Medication requested:   Requested Prescriptions     Pending Prescriptions Disp Refills    venlafaxine (EFFEXOR XR) 37.5 MG XR capsule [Pharmacy Med Name: VENLAFAXINE ER 37.5MG  CAPSULES] 30 capsule 0     Sig: TAKE 1 CAPSULE(37.5 MG) BY MOUTH DAILY       Date of last refill: 10.24.18    Last OV (provider):     10.24.18  Last OV (department):     Next OV (provider):      Visit date not found  Next OV (department): Visit date not found

## 2017-06-15 ENCOUNTER — Other Ambulatory Visit (HOSPITAL_BASED_OUTPATIENT_CLINIC_OR_DEPARTMENT_OTHER): Payer: Self-pay | Admitting: Urology

## 2017-06-15 DIAGNOSIS — N200 Calculus of kidney: Secondary | ICD-10-CM

## 2017-06-15 MED ORDER — OXYBUTYNIN CHLORIDE 5 MG OR TABS
5.0000 mg | ORAL_TABLET | Freq: Three times a day (TID) | ORAL | 0 refills | Status: DC | PRN
Start: 2017-06-15 — End: 2017-06-22

## 2017-06-15 NOTE — Telephone Encounter (Signed)
Medication: OXYBUTYNIN 5 MG     Pharmacy:     Last seen:  Next Visit:        Refill request pending; will be routed to prescribing physician.

## 2017-06-17 ENCOUNTER — Encounter (HOSPITAL_COMMUNITY): Admission: RE | Disposition: A | Discharge status: Home Health | Payer: Self-pay | Attending: Urology

## 2017-06-17 ENCOUNTER — Other Ambulatory Visit (INDEPENDENT_AMBULATORY_CARE_PROVIDER_SITE_OTHER): Payer: Self-pay | Admitting: Internal Medicine

## 2017-06-17 ENCOUNTER — Ambulatory Visit (HOSPITAL_BASED_OUTPATIENT_CLINIC_OR_DEPARTMENT_OTHER): Payer: BC Managed Care – PPO | Admitting: Anesthesiology

## 2017-06-17 ENCOUNTER — Ambulatory Visit
Admission: RE | Admit: 2017-06-17 | Discharge: 2017-06-17 | Disposition: A | Discharge status: Home / Self Care | Payer: BC Managed Care – PPO | Attending: Urology | Admitting: Urology

## 2017-06-17 ENCOUNTER — Ambulatory Visit (HOSPITAL_COMMUNITY): Payer: BC Managed Care – PPO | Admitting: Anesthesiology

## 2017-06-17 ENCOUNTER — Ambulatory Visit (HOSPITAL_BASED_OUTPATIENT_CLINIC_OR_DEPARTMENT_OTHER): Payer: BC Managed Care – PPO

## 2017-06-17 DIAGNOSIS — Z9889 Other specified postprocedural states: Secondary | ICD-10-CM | POA: Insufficient documentation

## 2017-06-17 DIAGNOSIS — I129 Hypertensive chronic kidney disease with stage 1 through stage 4 chronic kidney disease, or unspecified chronic kidney disease: Secondary | ICD-10-CM | POA: Insufficient documentation

## 2017-06-17 DIAGNOSIS — J4520 Mild intermittent asthma, uncomplicated: Principal | ICD-10-CM

## 2017-06-17 DIAGNOSIS — Z91040 Latex allergy status: Secondary | ICD-10-CM | POA: Insufficient documentation

## 2017-06-17 DIAGNOSIS — K219 Gastro-esophageal reflux disease without esophagitis: Secondary | ICD-10-CM | POA: Insufficient documentation

## 2017-06-17 DIAGNOSIS — R7303 Prediabetes: Secondary | ICD-10-CM | POA: Insufficient documentation

## 2017-06-17 DIAGNOSIS — Z87440 Personal history of urinary (tract) infections: Secondary | ICD-10-CM | POA: Insufficient documentation

## 2017-06-17 DIAGNOSIS — Z882 Allergy status to sulfonamides status: Secondary | ICD-10-CM | POA: Insufficient documentation

## 2017-06-17 DIAGNOSIS — J45909 Unspecified asthma, uncomplicated: Secondary | ICD-10-CM | POA: Insufficient documentation

## 2017-06-17 DIAGNOSIS — Z7984 Long term (current) use of oral hypoglycemic drugs: Secondary | ICD-10-CM | POA: Insufficient documentation

## 2017-06-17 DIAGNOSIS — E079 Disorder of thyroid, unspecified: Secondary | ICD-10-CM | POA: Insufficient documentation

## 2017-06-17 DIAGNOSIS — I10 Essential (primary) hypertension: Secondary | ICD-10-CM

## 2017-06-17 DIAGNOSIS — Z91048 Other nonmedicinal substance allergy status: Secondary | ICD-10-CM | POA: Insufficient documentation

## 2017-06-17 DIAGNOSIS — Z90710 Acquired absence of both cervix and uterus: Secondary | ICD-10-CM | POA: Insufficient documentation

## 2017-06-17 DIAGNOSIS — N200 Calculus of kidney: Secondary | ICD-10-CM

## 2017-06-17 DIAGNOSIS — Z86718 Personal history of other venous thrombosis and embolism: Secondary | ICD-10-CM | POA: Insufficient documentation

## 2017-06-17 DIAGNOSIS — Z881 Allergy status to other antibiotic agents status: Secondary | ICD-10-CM | POA: Insufficient documentation

## 2017-06-17 DIAGNOSIS — Z885 Allergy status to narcotic agent status: Secondary | ICD-10-CM | POA: Insufficient documentation

## 2017-06-17 DIAGNOSIS — N189 Chronic kidney disease, unspecified: Secondary | ICD-10-CM | POA: Insufficient documentation

## 2017-06-17 DIAGNOSIS — Z79899 Other long term (current) drug therapy: Secondary | ICD-10-CM | POA: Insufficient documentation

## 2017-06-17 DIAGNOSIS — Z87442 Personal history of urinary calculi: Secondary | ICD-10-CM | POA: Insufficient documentation

## 2017-06-17 LAB — GLUCOSE (POCT)
Glucose (POCT): 95 mg/dL (ref 70–99)
Glucose (POCT): 159 mg/dL — ABNORMAL HIGH (ref 70–99)
Glucose (POCT): 116 mg/dL — ABNORMAL HIGH (ref 70–99)

## 2017-06-17 SURGERY — URETEROSCOPY, NON-STONE
Anesthesia: General | Site: Ureter | Laterality: Bilateral | Wound class: Class II (Clean Contaminated)

## 2017-06-17 MED ORDER — ONDANSETRON HCL 4 MG/2ML IV SOLN
4.0000 mg | Freq: Once | INTRAMUSCULAR | Status: DC | PRN
Start: 2017-06-17 — End: 2017-06-17

## 2017-06-17 MED ORDER — FENTANYL CITRATE (PF) 100 MCG/2ML IJ SOLN
50.0000 ug | INTRAMUSCULAR | Status: DC | PRN
Start: 2017-06-17 — End: 2017-06-17
  Administered 2017-06-17 (×2): 50 ug via INTRAVENOUS
  Filled 2017-06-17: qty 2

## 2017-06-17 MED ORDER — SODIUM CHLORIDE 0.9 % IV SOLN
12.5000 mg | Freq: Once | INTRAVENOUS | Status: DC | PRN
Start: 2017-06-17 — End: 2017-06-17

## 2017-06-17 MED ORDER — LACTATED RINGERS IV SOLN
INTRAVENOUS | Status: DC | PRN
Start: 2017-06-17 — End: 2017-06-17
  Administered 2017-06-17: 11:00:00 via INTRAVENOUS

## 2017-06-17 MED ORDER — DIPHENHYDRAMINE HCL 50 MG/ML IJ SOLN
INTRAMUSCULAR | Status: DC | PRN
Start: 2017-06-17 — End: 2017-06-17
  Administered 2017-06-17: 12.5 mg via INTRAVENOUS

## 2017-06-17 MED ORDER — OXYCODONE-ACETAMINOPHEN 5-325 MG OR TABS
1.0000 | ORAL_TABLET | Freq: Once | ORAL | Status: AC
Start: 2017-06-17 — End: 2017-06-17
  Administered 2017-06-17 (×2): 1 via ORAL
  Filled 2017-06-17: qty 1

## 2017-06-17 MED ORDER — ONDANSETRON HCL 4 MG/2ML IV SOLN
INTRAMUSCULAR | Status: DC | PRN
Start: 2017-06-17 — End: 2017-06-17
  Administered 2017-06-17: 4 mg via INTRAVENOUS

## 2017-06-17 MED ORDER — LABETALOL HCL 5 MG/ML IV SOLN
10.0000 mg | INTRAVENOUS | Status: DC | PRN
Start: 2017-06-17 — End: 2017-06-17
  Administered 2017-06-17: 10 mg via INTRAVENOUS

## 2017-06-17 MED ORDER — FENTANYL CITRATE (PF) 100 MCG/2ML IJ SOLN
25.0000 ug | INTRAMUSCULAR | Status: DC | PRN
Start: 2017-06-17 — End: 2017-06-17

## 2017-06-17 MED ORDER — DEXAMETHASONE SODIUM PHOSPHATE 4 MG/ML IJ SOLN (CUSTOM)
INTRAMUSCULAR | Status: DC | PRN
Start: 2017-06-17 — End: 2017-06-17
  Administered 2017-06-17 (×2): 6 mg via INTRAVENOUS

## 2017-06-17 MED ORDER — LACTATED RINGERS IV SOLN
INTRAVENOUS | Status: DC
Start: 2017-06-17 — End: 2017-06-17

## 2017-06-17 MED ORDER — IOHEXOL 240 MG/ML IJ SOLN
INTRAMUSCULAR | Status: DC | PRN
Start: 2017-06-17 — End: 2017-06-17
  Administered 2017-06-17: 50 mL

## 2017-06-17 MED ORDER — LABETALOL HCL 5 MG/ML IV SOLN
INTRAVENOUS | Status: DC
Start: 2017-06-17 — End: 2017-06-17
  Filled 2017-06-17: qty 4

## 2017-06-17 MED ORDER — CIPROFLOXACIN IN D5W 400 MG/200ML IV SOLN
INTRAVENOUS | Status: DC | PRN
Start: 2017-06-17 — End: 2017-06-17
  Administered 2017-06-17: 400 mg via INTRAVENOUS

## 2017-06-17 MED ORDER — FAMOTIDINE 20 MG/2ML IV SOLN
INTRAVENOUS | Status: DC | PRN
Start: 2017-06-17 — End: 2017-06-17
  Administered 2017-06-17: 20 mg via INTRAVENOUS

## 2017-06-17 MED ORDER — NALOXONE HCL 0.4 MG/ML IJ SOLN
0.1000 mg | INTRAMUSCULAR | Status: DC | PRN
Start: 2017-06-17 — End: 2017-06-17

## 2017-06-17 MED ORDER — PROPOFOL 1000 MG/100ML IV EMUL
INTRAVENOUS | Status: DC | PRN
Start: 2017-06-17 — End: 2017-06-17
  Administered 2017-06-17: 50 ug/kg/min via INTRAVENOUS

## 2017-06-17 MED ORDER — MIDAZOLAM HCL 2 MG/2ML IJ SOLN
INTRAMUSCULAR | Status: DC | PRN
Start: 2017-06-17 — End: 2017-06-17
  Administered 2017-06-17: 2 mg via INTRAVENOUS

## 2017-06-17 MED ORDER — MEPERIDINE HCL 25 MG/ML IJ SOLN
12.5000 mg | INTRAMUSCULAR | Status: DC | PRN
Start: 2017-06-17 — End: 2017-06-17

## 2017-06-17 MED ORDER — FENTANYL CITRATE (PF) 250 MCG/5ML IJ SOLN
INTRAMUSCULAR | Status: DC | PRN
Start: 2017-06-17 — End: 2017-06-17
  Administered 2017-06-17: 100 ug via INTRAVENOUS
  Administered 2017-06-17: 25 ug via INTRAVENOUS

## 2017-06-17 MED ORDER — LIDOCAINE HCL (PF) 1 % IJ SOLN
0.1000 mL | Freq: Once | INTRAMUSCULAR | Status: DC | PRN
Start: 2017-06-17 — End: 2017-06-17

## 2017-06-17 SURGICAL SUPPLY — 22 items
BENZOIN TINCTURE AMPULE STERILE (Misc Medical Supply) ×2
CATHETER RETRIEVAL DAKOTA 1.9FR X 8 X 120CM (Procedural wires/sheaths/catheters/balloons/dilators) ×2 IMPLANT
CATHETER URETERAL OPEN END 5FR X 70CM (Lines/Drains) ×2 IMPLANT
CONTAINER PRECISION SPECIMEN, 4OZ- STERILE (Misc Medical Supply) ×2 IMPLANT
DRAPE URO CATCHER BAG-STERILE (Drapes/towels) ×2 IMPLANT
EXTRACTOR STONE NCOMPASS 1.7 FR 115CM 4-WIRE BASKET (Procedural wires/sheaths/catheters/balloons/dilators) ×2 IMPLANT
FEE LUMENIS HOLMIUM LASER TECH ONLY (Services) ×2 IMPLANT
GLOVE BIOGEL PI INDICATOR SIZE 7 (Gloves/gowns) ×2
GLOVE BIOGEL PI INDICATOR SIZE 7.5 (Gloves/gowns) ×2 IMPLANT
GLOVE BIOGEL PI ULTRATOUCH SIZE 6.5 (Gloves/gowns) ×2
GLOVE BIOGEL PI ULTRATOUCH SIZE 7 (Gloves/gowns) ×2 IMPLANT
GLOVE BIOGEL PI ULTRATOUCH SIZE 7.5 (Gloves/gowns) ×4
GUIDEWIRE SENSOR DUAL FLEX STRAIGHT TIP .035 X 150CM NITINOL (Procedural wires/sheaths/catheters/balloons/dilators) ×4
PROTECTOR ULNAR NERVE PAD, YELLOW (Misc Medical Supply) ×2 IMPLANT
SHEATH URETERAL NAVIGATOR 11F/13F X 36 (Misc Medical Supply) ×2 IMPLANT
SLEEVE SCD KNEE MEDIUM (Misc Medical Supply) ×2 IMPLANT
SOLUTION IRR POUR BTL 0.9% NS 1000ML (Non-Pharmacy Meds/Solutions) ×2 IMPLANT
SOLUTION IRR POUR BTL H20 1000ML (Non-Pharmacy Meds/Solutions) ×2
STENT URETERAL CONTOUR 6FR X 24CM (Ureteral Stents) ×2 IMPLANT
STRIP MEDI-STRIP SKIN CLOSURE 1/2 X 4" (Dressings/packing) ×1 IMPLANT
STRIP SKIN CLOSURE 1/2 X 4 (Dressings/packing) ×1
SURGICAL PACK CYSTO - SAME DAY (Procedure Packs/kits) ×2 IMPLANT

## 2017-06-17 NOTE — H&P (Addendum)
UROLOGY HISTORY AND PHYSICAL    cc:  Nephrolithiasis     History of Present Illness:     Melanie Walsh is a 57 year old female with hx of nephrolithaisis. Recently seen by Dr. Reather Converse at which time she was thought to have passed her left distal ureteral calculus but still has a right ureteral calculus given the bedside hydronephrosis. Plan for bilateral URS today.     Pre-op UCx negative.     ROS:  Per HPI    Past Medical and Surgical History:  Past Medical History:   Diagnosis Date    Allergic rhinitis     Asthma     DVT (deep venous thrombosis) (CMS-HCC)     HTN (hypertension)     Kidney disease, medullary sponge     Kidney stones     Pneumonia     Thyroid disease     Trigeminal neuralgia     UTI (urinary tract infection)      Past Surgical History:   Procedure Laterality Date    ANTERIOR CRUCIATE LIGAMENT REPAIR      appendix      HYSTERECTOMY      KNEE SURGERY Bilateral     OSTEOTOMY      SHOULDER SURGERY         Allergies:  Allergies   Allergen Reactions    Adhesive Tape Rash    Latex Rash    Morphine Shortness of Breath and Swelling    Nickel Rash     Swelling and rash    Petroleum Jelly [Petrolatum] Rash    Sulfa Drugs Rash     Related topical skin cream    Dilaudid  [Hydromorphone Hcl] Unspecified    Erythromycin Nausea Only    Oxycodone-Aspirin Other     Headaches and nightmares       Medications:  No current facility-administered medications on file prior to encounter.      Current Outpatient Medications on File Prior to Encounter   Medication Sig Dispense Refill    acetaminophen-codeine (TYLENOL #3) 300-30 MG tablet Take 2 tablets by mouth every 6 hours as needed for Moderate Pain (Pain Score 4-6). 56 tablet 0    amLODIPINE (NORVASC) 5 MG tablet Take 5 mg by mouth daily.       carBAMazepine (TEGRETOL) 200 MG tablet Take 1 tablet (200 mg) by mouth daily. 90 tablet 1    Cholecalciferol (VITAMIN D PO)       estradiol (VAGIFEM) 10 MCG vaginal tabs Insert 1 tablet (10 mcg) vaginally  twice a week. 8 tablet 3    fluticasone-salmeterol (ADVAIR) 100-50 MCG/DOSE inhaler Inhale 1 puff by mouth every 12 hours. 1 Inhaler 2    hydrochlorothiazide (HYDRODIURIL) 25 MG tablet Take 1 tablet (25 mg) by mouth daily. 30 tablet 5    levalbuterol (XOPENEX HFA) 45 MCG/ACT inhaler Inhale 2 puffs by mouth every 6 hours as needed for Wheezing. 1 Inhaler 3    levothyroxine (SYNTHROID) 50 MCG tablet TAKE 1 TABLET(50 MCG) BY MOUTH DAILY 90 tablet 1    metFORMIN (GLUCOPHAGE XR) 500 MG XR tablet TAKE 2 TABLETS(1000 MG) BY MOUTH DAILY 180 tablet 1    miscellaneous compounded medication Testosterone 1% cream, apply 0.5 g to skin of forearm, lower abdomen or thigh qam as directed 45 g 1    oxyCODONE-acetaminophen (PERCOCET) 5-325 MG tablet Take 1 tablet by mouth every 6 hours as needed for Severe Pain (Pain Score 7-10). 20 tablet 0    oxyCODONE-acetaminophen (PERCOCET)  5-325 MG tablet Take 1 tablet by mouth At bedtime as needed for Severe Pain (Pain Score 7-10).      phenazopyridine (PYRIDIUM) 200 MG tablet Take 1 tablet (200 mg) by mouth 3 times daily as needed for Mild Pain (Pain Score 1-3) (urinary frequency, urgency, dysuria). 21 tablet 0    pimecrolimus (ELIDEL) 1 % cream Apply twice daily as needed for redness on face 30 g 3    Potassium Citrate ER 15 MEQ (1620 MG) TBCR Take 15 mEq by mouth 2 times daily. 180 tablet 3    tamsulosin (FLOMAX) 0.4 MG capsule Take 1 capsule (0.4 mg) by mouth daily. Take 7 days prior surgery and 7 days after 14 capsule 0    tamsulosin (FLOMAX) 0.4 MG capsule Take 1 capsule (0.4 mg) by mouth daily. 30 capsule 1    VIVELLE-DOT 0.05 MG/24HR patch APPLY 1 PATCH ON SKIN TWICE WEEKLY 24 patch 0       Social History:  Social History     Socioeconomic History    Marital status: Married     Spouse name: Not on file    Number of children: Not on file    Years of education: 4 Year     Highest education level: Not on file   Occupational History    Not on file   Tobacco Use     Smoking status: Never Smoker    Smokeless tobacco: Never Used   Substance and Sexual Activity    Alcohol use: Yes     Comment: Occasioanl     Drug use: No     Comment: denies    Sexual activity: Not on file   Social Activities of Daily Living Present    Not on file   Social History Narrative    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    2 adult twin daughters (Pocahontas and Gunnison merced)    Going to school for masters    married       Family History:  Family History   Problem Relation Name Age of Onset    Cancer Father      Heart Disease Father      Hypertension Father      Cholesterol/Lipid Disorder Father      Cancer Maternal Grandfather      Cancer Paternal Grandmother      Stroke Paternal Grandmother      Cancer Paternal Grandfather      Diabetes Paternal Grandfather      Melanoma Cancer Mother      Dementia Mother      Thyroid Mother      Other Mother          kidney disease    Cholesterol/Lipid Disorder Sister      Other Daughter          kidney disease       ----------------------------------------------------------------------------------------------    Physical Exam:  BP  Min: 169/109  Max: 169/109  Temp  Min: 98.5 F (36.9 C)  Max: 98.5 F (36.9 C)  Pulse  Min: 87  Max: 87  Resp  Min: 16  Max: 16  SpO2  Min: 98 %  Max: 98 %  Height  Min: 5\' 4"  (162.6 cm)  Max: 5\' 4"  (162.6 cm)  Weight  Min: 81.6 kg (180 lb)  Max: 81.6 kg (180 lb)    No intake/output data recorded.     Gen-   NAD, alert and oriented  Pulm-   Non-labored on room air   Abd/GU- Soft, NT, ND. GU deferred to OR     Labs:   Lab Results   Component Value Date    WBC 5.8 07/19/2016    RBC 4.10 07/19/2016    HGB 13.6 07/19/2016    HCT 40.1 07/19/2016    MCV 97.8 (H) 07/19/2016    MCHC 33.9 07/19/2016    RDW 12.6 07/19/2016    PLT 224 07/19/2016    MPV 10.5 07/19/2016       Lab Results   Component Value Date    NA 141 06/04/2017    K 3.9 06/04/2017    CL 100 06/04/2017    BICARB 27 06/04/2017    BUN 13  06/04/2017    CREAT 0.74 06/04/2017    GLU 104 (H) 06/04/2017    [place] 9.8 06/04/2017       Lab Results   Component Value Date    COLORUA Yellow 05/21/2017    APPEARUA Slightly Hazy 05/21/2017    GLUCOSEUA Negative 05/21/2017    BILIUA Negative 05/21/2017    KETONEUA Negative 05/21/2017    SGUA 1.024 05/21/2017    BLOODUA Negative 05/21/2017    PHUA 6.0 05/21/2017    PROTEINUA Negative 05/21/2017    UROBILUA Negative 05/21/2017    NITRITEUA Negative 05/21/2017    LEUKESTUA Negative 05/21/2017    WBCUA 3-5 (A) 05/21/2017    RBCUA 0-2 05/21/2017       No results found for: INR, PTT       Microbiology:  UCx negative    Imaging:  Reviewed    ______________________________________________________________________  Assessment and Plan:  Melanie Walsh is a 57yo F with hx of bilateral nephrolithiasis.     - to OR for bilateral URS/HLL, stent placemetn    Staffed w/ attending, Dr. Carlena Bjornstad  --Margaretha Sheffield, MD  Urology PGY-5  [phone]    Attending attestation: The patient was seen and examined by me. I agree with the above resident history, exam and plan.   We had a thorough preop discussion. She would like to be as stone-free as possible. Consent update. Plan for bilateral ureteroscopy/holmium laser lithotripsy/ureteral stents.

## 2017-06-17 NOTE — RN OR/Procedure Note (Signed)
Dr. Karren Cobble here to address BP issues.  Patient did not take her AM BP meds and is hypertensive 169/109(129).  After he reviewed her previous anesthesia records he noted that her BP cam down significantly with anesthesia and decided not to treat hypertension at this time.  Patient aware of same.  No signs of distress, resting comfortably.

## 2017-06-17 NOTE — Addendum Note (Signed)
Addendum  created 06/17/17 1221 by Floydene Flock, CRNA    Review and Sign - Ready for Procedure, Review and Sign - Signed

## 2017-06-17 NOTE — Discharge Instructions (Signed)
UROLOGY DISCHARGE INSTRUCTIONS    Diagnosis and Reason for Admission    You were admitted to the hospital for the following reason(s):  Kidney stones    What Happened During [facility] Hospital Stay    The main tests and treatments done for you during this hospitalization were:    Ureteroscopy and basketing of stones  Right ureteral stent placement   __________________________________________________________________      1. Follow-up:  You will follow-up in Dr. Threasa Heads clinic next Tuesday for stent removal. Someone will call you to schedule this.     2. Diet:   Fluids are encouraged. Progress to your normal diet as tolerated.     3. Activity:  No heavy lifting (nothing greater than 10 pounds) or strenuous activity until stent is removed. Walking is encouraged!    4. Bathing:  You may shower. No soaking or swimming until stent has been removed.   Keep string and bandages in place.     5. Medications:  You have been prescribed percocet for pain. This medication can cause constipation so take stool softener as needed.   You have also been prescribed flomax which can help with stent discomfort and pyridium which helps with burning with urination (this medication will turn your urine [place]).  You can take ibuprofen as well to help with stent discomfort and inflammation.     Call your doctor or come to the Emergency Department at [facility] if you have:   Severe chills or fever   Excessive blood in the urine with clots or inability to urinate   Uncontrollable pain, nausea, or vomiting   Fainting, trouble breathing, or persistent dizzines    For any concerns, call the [facility] Page Operator at [phone] and ask for the Doctor On-Call for Urology

## 2017-06-17 NOTE — Anesthesia Postprocedure Evaluation (Signed)
Anesthesia Transfer of Care Note    Patient: Melanie Walsh    Procedures performed: Procedure(s):  BILATERAL URETEROSCOPY WITH BILATERAL RETROGRADE PYELOGRAMS, RIGHT URETERAL STENT PLACEMENT    Vital signs: stable           Anesthesia Post Note    Patient: Melanie Walsh    Procedure(s) Performed: Procedure(s):  BILATERAL URETEROSCOPY WITH BILATERAL RETROGRADE PYELOGRAMS, RIGHT URETERAL STENT PLACEMENT      Final anesthesia type: General    Patient location: PACU    Post anesthesia pain: adequate analgesia    Mental status: awake, alert  and oriented    Airway Patent: Yes    Last Vitals:   Vitals:    06/17/17 1210   BP: 184/87   Pulse: 80   Resp: 15   Temp:    SpO2: 100%       Post vital signs: stable    Hydration: adequate    N/V:no    Anesthetic complications: no         Plan of care per primary team.

## 2017-06-17 NOTE — Telephone Encounter (Signed)
Prescription Refill Request     Name of PCP Provider: Berta Minor   Last office visit: 12/23/2016  Next office visit: Visit date not found    Requested Prescriptions     Pending Prescriptions Disp Refills    levalbuterol (XOPENEX) 45 MCG/ACT inhaler [Pharmacy Med Name: LEVALBUTEROL HFA INH (200PF) 15GM] 15 g 0     Sig: INHALE 2 PUFFS BY MOUTH EVERY 6 HOURS AS NEEDED FOR WHEEZING       Last Refill: no dates found    Send to:     [facility] [place] - [id number] AT [address]  [place]  [place] [address]  Phone: [phone] Fax: [phone]

## 2017-06-17 NOTE — Anesthesia Preprocedure Evaluation (Signed)
ANESTHESIA PRE-OPERATIVE EVALUATION    Patient Information    Name: Melanie Walsh    MRN: [id number]    DOB: 12-25-1960    Age: 57 year old    Sex: female  Procedure(s):  LT URETEROSCOPY STONE EXTRACTION       LMP 03/31/2007 (Approximate)         Primary language spoken:  English    ROS/Medical History:           General:   Cardiovascular:  hypertension,     Anesthesia History:  negative anesthesia history ROS  Pt has had GA before; no problems      TELEPHONE INTERVIEW-NEEDS PHYSICAL Pulmonary:   asthma ( stable on inhalers, no admission or intubations),     Neuro/Psych:   neuromuscular disease (Tigeminal neuralgia, left side no numbenss ),   Hematology/Oncology:    Hx of DVT 2007 -resolved      GI/Hepatic:  GERD ( PRN OTC),   Infectious Disease:     Renal:  Kidney stone- procedure pending    Hx Medullary Kidney disease       Endocrine/Other:  Pre DM- on Metformin A1c ordered dos     Pregnancy History:   Pediatrics:         Pre Anesthesia Testing (PCC/CPC) notes/comments:    [facility] Test & records reviewed by [facility] Provider.                                   Physical Exam    Airway:  Inter-inciser distance > 4 cm  Prognanth Able    Mallampati: II  Neck ROM: full  TM distance: 5-6 cm  Short thick neck: No        Cardiovascular:  - cardiovascular exam normal         Pulmonary:  - pulmonary exam normal           Neuro/Neck/Skeletal/Skin:  - [place] ANE PHYS EXAM NEGATIVE ROS SKIN SKELETAL NEURO NECK          Dental:      Abdominal:   - normal exam     General: normal weight     Additional Clinical Notes:               Last  OSA (STOP BANG) Score:  No Data Recorded    Last OSA  (STOP) Score for   No Data Recorded                 Past Medical History:   Diagnosis Date    Allergic rhinitis     Asthma     DVT (deep venous thrombosis) (CMS-HCC)     HTN (hypertension)     Kidney disease, medullary sponge     Kidney stones     Pneumonia     Thyroid disease     Trigeminal neuralgia     UTI (urinary tract infection)      Past  Surgical History:   Procedure Laterality Date    ANTERIOR CRUCIATE LIGAMENT REPAIR      appendix      HYSTERECTOMY      KNEE SURGERY Bilateral     OSTEOTOMY      SHOULDER SURGERY       Social History     Tobacco Use    Smoking status: Never Smoker    Smokeless tobacco: Never Used   Substance Use Topics    Alcohol  use: Yes     Comment: Occasioanl     Drug use: No     Comment: denies       No current facility-administered medications for this visit.      No current outpatient medications on file.     Facility-Administered Medications Ordered in Other Visits   Medication Dose Route Frequency Provider Last Rate Last Dose    lactated ringers infusion   IntraVENOUS Continuous Wesley Blas, MD        lidocaine, PF 1% injection 0.1 mL  0.1 mL IntraDERMAL Once PRN Wesley Blas, MD         Allergies   Allergen Reactions    Adhesive Tape Rash    Latex Rash    Morphine Shortness of Breath and Swelling    Nickel Rash     Swelling and rash    Petroleum Jelly [Petrolatum] Rash    Sulfa Drugs Rash     Related topical skin cream    Dilaudid  [Hydromorphone Hcl] Unspecified    Erythromycin Nausea Only    Oxycodone-Aspirin Other     Headaches and nightmares       Labs and Other Data  Lab Results   Component Value Date    NA 141 06/04/2017    K 3.9 06/04/2017    CL 100 06/04/2017    BICARB 27 06/04/2017    BUN 13 06/04/2017    CREAT 0.74 06/04/2017    GLU 104 (H) 06/04/2017    [place] 9.8 06/04/2017     Lab Results   Component Value Date    AST 13 07/19/2016    ALT 13 07/19/2016    ALK 55 07/19/2016    TP 6.1 07/19/2016    ALB 3.7 07/19/2016    TBILI 0.22 07/19/2016     Lab Results   Component Value Date    WBC 5.8 07/19/2016    RBC 4.10 07/19/2016    HGB 13.6 07/19/2016    HCT 40.1 07/19/2016    MCV 97.8 (H) 07/19/2016    MCHC 33.9 07/19/2016    RDW 12.6 07/19/2016    PLT 224 07/19/2016    MPV 10.5 07/19/2016    SEG 52 07/19/2016    LYMPHS 32 07/19/2016    MONOS 11 07/19/2016    EOS 4 07/19/2016    BASOS 1  07/19/2016     No results found for: INR, PTT  No results found for: ARTPH, ARTPO2, ARTPCO2    Anesthesia Plan:  Risks and Benefits of Anesthesia  I personally examined the patient immediately prior to the anesthetic and reviewed the pertinent medical history, drug and allergy history, laboratory and imaging studies and consultations. I have determined that the patient has had adequate assessment and testing.    Anesthetic techniques, invasive monitors, anesthetic drugs for induction, maintenance and post-operative analgesia, risks and alternatives have been explained to the patient and/or patient's representatives.    I have prescribed the anesthetic plan:         Planned anesthesia method: General         ASA 2 (Mild systemic disease)     Potential anesthesia problems identified and risks including but not limited to the following were discussed with patient and/or patient's representative: Adverse or allergic drug reaction, Dental injury or sore throat, Nerve injury and Recall    Planned monitoring method: Routine monitoring  Comments: (H&P reviewed.  Plan GA.)    Informed Consent:  Anesthetic plan and risks discussed with  Patient.    Plan discussed with CRNA, Surgeon and Attending.

## 2017-06-17 NOTE — Brief Op Note (Signed)
UROLOGY BRIEF OPERATIVE NOTE    DATE: 06/17/2017  TIME: 11:55 AM      PREOPERATIVE DIAGNOSIS: Bilateral nephrolithiasis    POSTOPERATIVE DIAGNOSIS: Right renal stones    PROCEDURE:   1. Bilateral ureteroscopy with retrograde pyelograms  2. Right renal stone basketing  3. Right 6Fx22cm ureteral stent placement     ATTENDING SURGEON: Verlene Mayer): Jakylan Ron    ANESTHESIA: General  ANTIBIOTICS: Cipro    FINDINGS: A few very small renal stones in the right kidney which were too small for laser lithotripsy, [place] and N-compass baskets used to retrieve stones. Left diagnostic ureteroscopy without evidence of ureteral or renal stones. Randall's plaques consistent with ongoing stone formation.Right  6Fx22cm ureteral stent placement on string, no left stent.     WOUND CLASSIFICATION:  Procedure(s):  BILATERAL URETEROSCOPY WITH BILATERAL RETROGRADE PYELOGRAMS, RIGHT URETERAL STENT PLACEMENT - Wound Class: Class II (Clean Contaminated) - Incision Closure: N/A    WOUND CLOSURE STATUS:  Procedure(s):  BILATERAL URETEROSCOPY WITH BILATERAL RETROGRADE PYELOGRAMS, RIGHT URETERAL STENT PLACEMENT - Wound Class: Class II (Clean Contaminated) - Incision Closure: N/A    SPECIMENS:   ID Type Source Tests Collected by Time Destination   A : RIGHT URETERAL STONES FOR ANALYSIS [place] Kidney, Right PATHOLOGY TISSUE EXAM Kathleen Lime, MD 06/17/2017 1153        FLUIDS/BLOOD PRODUCTS:    IV Fluids: 700cc    Blood Products: none    EBL: none    Urine Output: none    COMPLICATIONS: none    DISPOSITION: stable and extubated to PACU    POST-OP PLAN  - D/c home  - Anticipate stent with string removal next Tuesday in clinic

## 2017-06-17 NOTE — Op Note (Signed)
DATE OF SERVICE:  06/17/2017    PREOPERATIVE DIAGNOSIS:  Bilateral kidney stones.    POSTOPERATIVE DIAGNOSIS:  Bilateral kidney stones.    PROCEDURE PERFORMED:  Cystoscopy, bilateral ureteroscopy, bilateral  retrograde pyelograms, and right ureteral stent placement 6-French x  22 cm.     SURGEON/STAFF:  Kathleen Lime, MD    ASSISTANT:  Delanna Ahmadi, M.D.    INDICATION:  Melanie Walsh is a 57 year old female with a history of  recurrent kidney stones.  She was recently found to have bilateral  kidney stones with a larger stone burden on the right.  Small stones  had been very symptomatic for her and she elected to proceed with  ureteroscopy bilaterally to clear her of any stones.     FINDINGS:    1.  Right-sided stones basketed in their entirety.    2.  No treatable left-sided stones.    3.  Randall plaques visible bilaterally.    4.  6-French x 20 cm double-J stent on a string well positioned in the  right kidney.     DESCRIPTION OF PROCEDURE:  After risks, benefits, alternatives, and  indications were discussed in detail, the patient elected to proceed.   Informed consent was obtained and she was brought to the operating  room and administered general anesthesia, was prepped and draped in  the usual sterile fashion in the dorsal lithotomy position.  SCDs  were applied prior to induction.  She was given preoperative  ciprofloxacin.     We entered the bladder with the rigid cystoscope and identified the  right ureteral orifice.  A Sensor wire was then passed up in the  right kidney as seen on fluoroscopy.  A right retrograde pyelogram  was performed which showed no hydronephrosis and no stones in the  ureter.  A 13-French x 36 cm ureteral access sheath was then gently  passed over the wire up to the right ureteropelvic junction.  All  calices were explored and 4 stones were basketed with a combination  of the [place] and [place].  A retrograde pyelogram was again  performed and all calices were confirmed  to be free of any stones.  The scope was then removed and the ureter was evaluated under direct  vision with no stones or injuries within the ureter.     We turned our attention to the left side.  A Sensor wire was then  placed to the left kidney and a flexible ureteroscope was gently  passed over the wire into the left kidney.  A left retrograde  pyelogram had been performed with no hydronephrosis.  It confirmed  that all calices were explored and there were no stones seen.  There  were tiny plaques present, but no stones that were treatable.  The  scope was removed under direct vision.  There were no ureteral stones  or ureteral injury or trauma present.     We then placed a 6-French x 22 cm double-J stent in the usual fashion  with a good curl seen in the right renal pelvis on fluoroscopy as  well as in the bladder under direct vision.  The bladder was emptied  and the stent was left attached to the patient's skin.  The case was  then ended.     COMPLICATIONS:  None.    SPECIMENS:  Stones for analysis.    DISPOSITION:  Stable to PACU.  Stent removal on a string in 2-4 days.  Job #:  [id number]

## 2017-06-17 NOTE — Plan of Care (Signed)
Problem: Promotion of Perioperative Health and Safety  Goal: Promotion of Health and Safety of the Perioperative Patient  The patient remains safe, receives treatment appropriate to the surgical intervention and patient's physiological needs and is discharged or transferred to the appropriate level of care.    Information below is the current care plan.  Outcome: Progressing   06/17/17 1155   Perioperative Plan of Care   Guidelines PACU

## 2017-06-18 ENCOUNTER — Telehealth (HOSPITAL_BASED_OUTPATIENT_CLINIC_OR_DEPARTMENT_OTHER): Payer: Self-pay | Admitting: Urology

## 2017-06-18 MED ORDER — LEVALBUTEROL TARTRATE 45 MCG/ACT IN AERO
2.00 | INHALATION_SPRAY | Freq: Four times a day (QID) | RESPIRATORY_TRACT | 3 refills | Status: DC | PRN
Start: 2017-06-18 — End: 2017-09-16

## 2017-06-18 NOTE — Telephone Encounter (Signed)
Called patient to schedule stent on string removal on Tuesday, spoke with patient, patientnt stated that she feels pretty good and that she may remove the stent by her self. Ask patient to give [place] a call if that happens so we may have information that no appt is necessary.

## 2017-06-21 ENCOUNTER — Telehealth (HOSPITAL_BASED_OUTPATIENT_CLINIC_OR_DEPARTMENT_OTHER): Payer: Self-pay | Admitting: Urology

## 2017-06-21 NOTE — Telephone Encounter (Signed)
PT is calling to schedule a follow up appt with Dr. Reather Converse. PT only wants to see Dr. Reather Converse and no one else. I offered soonest available for Dr. Reather Converse but PT declined and is requesting a follow up appt in at least 6 weeks. PT describes cramping on her right side and some back pain which is expected after surgery    Please advise: [phone] ok to leave a vm - will be available after 2pm

## 2017-06-22 ENCOUNTER — Other Ambulatory Visit (HOSPITAL_BASED_OUTPATIENT_CLINIC_OR_DEPARTMENT_OTHER): Payer: Self-pay | Admitting: Urology

## 2017-06-22 ENCOUNTER — Ambulatory Visit (HOSPITAL_BASED_OUTPATIENT_CLINIC_OR_DEPARTMENT_OTHER): Payer: BC Managed Care – PPO

## 2017-06-22 DIAGNOSIS — N200 Calculus of kidney: Principal | ICD-10-CM

## 2017-06-22 MED ORDER — OXYBUTYNIN CHLORIDE 5 MG OR TABS
5.0000 mg | ORAL_TABLET | Freq: Three times a day (TID) | ORAL | 0 refills | Status: DC | PRN
Start: 2017-06-22 — End: 2017-07-08

## 2017-06-22 NOTE — Telephone Encounter (Signed)
Medication: OXYBUTYNIN 5 MG  Pharmacy:    Last seen: 06/04/17  Next Visit: 08/06/17       Refill request pending; will be routed to prescribing physician.

## 2017-06-26 ENCOUNTER — Other Ambulatory Visit (INDEPENDENT_AMBULATORY_CARE_PROVIDER_SITE_OTHER): Payer: Self-pay | Admitting: Internal Medicine

## 2017-06-26 DIAGNOSIS — I10 Essential (primary) hypertension: Principal | ICD-10-CM

## 2017-06-28 MED ORDER — HYDROCHLOROTHIAZIDE 25 MG OR TABS
ORAL_TABLET | ORAL | 2 refills | Status: AC
Start: 2017-06-28 — End: ?

## 2017-06-28 NOTE — Telephone Encounter (Signed)
Incoming e-request from pharmacy      Established with: Melanie Walsh   Last OV with PCP: 12/23/2016  Next OV with PCP: Visit date not found   Last refill date: 12/23/2016      Requested Medication(s):  Requested Prescriptions     Pending Prescriptions Disp Refills    hydrochlorothiazide (HYDRODIURIL) 25 MG tablet [Pharmacy Med Name: HYDROCHLOROTHIAZIDE 25MG TABLETS] 30 tablet 0     Sig: TAKE 1 TABLET(25 MG) BY MOUTH DAILY     Lab Results   Component Value Date    BUN 13 06/04/2017    CREAT 0.74 06/04/2017    CL 100 06/04/2017    NA 141 06/04/2017    K 3.9 06/04/2017    [place] 9.8 06/04/2017    TBILI 0.22 07/19/2016    ALB 3.7 07/19/2016    TP 6.1 07/19/2016    AST 13 07/19/2016    ALK 55 07/19/2016    BICARB 27 06/04/2017    ALT 13 07/19/2016    GLU 104 (H) 06/04/2017       Send to:     [id number] - [address] AT [place]  [place]  [place] [address]  Phone: [phone] Fax: [phone]

## 2017-07-08 ENCOUNTER — Other Ambulatory Visit (HOSPITAL_BASED_OUTPATIENT_CLINIC_OR_DEPARTMENT_OTHER): Payer: Self-pay | Admitting: Urology

## 2017-07-08 DIAGNOSIS — N200 Calculus of kidney: Principal | ICD-10-CM

## 2017-07-08 MED ORDER — OXYBUTYNIN CHLORIDE 5 MG OR TABS
5.0000 mg | ORAL_TABLET | Freq: Three times a day (TID) | ORAL | 0 refills | Status: AC | PRN
Start: 2017-07-08 — End: ?

## 2017-07-08 NOTE — Telephone Encounter (Signed)
Medication:  Oxybutynin 5 mg   Pharmacy: [facility]  [place]     Last seen: 06/04/17  Next Visit:   Last refill: 07/02/17       Refill request pending; will be routed to prescribing physician.

## 2017-07-16 ENCOUNTER — Encounter (INDEPENDENT_AMBULATORY_CARE_PROVIDER_SITE_OTHER): Payer: Self-pay | Admitting: Internal Medicine

## 2017-07-16 NOTE — Telephone Encounter (Signed)
From: Melanie Walsh  To: Berta Minor, MD  Sent: 07/16/2017 9:39 AM PDT  Subject: 1-Non Urgent Medical Advice    Good Morning Dr Jannifer Franklin,    I have been doing some research regarding a medication called Qsymia for dieting. Since I'm on the low oxilate diet for Kidney stones, which is basically pure carbs, I am gaining weight. The weight gain is really making my knees ache, which is making it difficult to exercise. I know I should make an appointment to come see you. But I'm so far away and in school all day Monday through Thursday then working a 12 hour day on Friday and Saturday. Would it be possible to try a 30 day prescription and then come in and see you?    Thank you    Melanie Walsh

## 2017-07-20 ENCOUNTER — Encounter (INDEPENDENT_AMBULATORY_CARE_PROVIDER_SITE_OTHER): Payer: Self-pay | Admitting: Internal Medicine

## 2017-07-20 DIAGNOSIS — Z6830 Body mass index (BMI) 30.0-30.9, adult: Secondary | ICD-10-CM

## 2017-07-20 NOTE — Progress Notes (Signed)
See my chart message

## 2017-07-30 ENCOUNTER — Encounter (HOSPITAL_BASED_OUTPATIENT_CLINIC_OR_DEPARTMENT_OTHER): Payer: BC Managed Care – PPO | Admitting: Urology

## 2017-08-06 ENCOUNTER — Ambulatory Visit (HOSPITAL_BASED_OUTPATIENT_CLINIC_OR_DEPARTMENT_OTHER): Payer: BC Managed Care – PPO | Admitting: Urology

## 2017-08-29 ENCOUNTER — Other Ambulatory Visit (INDEPENDENT_AMBULATORY_CARE_PROVIDER_SITE_OTHER): Payer: Self-pay | Admitting: Family Medicine

## 2017-08-29 MED ORDER — ESTRADIOL 0.05 MG/24HR TD PTTW
MEDICATED_PATCH | TRANSDERMAL | 0 refills | Status: DC
Start: 2017-08-29 — End: 2017-09-01

## 2017-08-30 ENCOUNTER — Encounter (INDEPENDENT_AMBULATORY_CARE_PROVIDER_SITE_OTHER): Payer: Self-pay | Admitting: Internal Medicine

## 2017-08-30 DIAGNOSIS — Z309 Encounter for contraceptive management, unspecified: Principal | ICD-10-CM

## 2017-08-30 NOTE — Telephone Encounter (Signed)
From: Melanie Walsh  To: Berta Minor, MD  Sent: 08/30/2017 9:52 AM PDT  Subject: 1-Non Urgent Medical Advice    Good morning,    I need a refill on my Vivelle dot patch and I am leaving out of town on Wednesday for my daughters graduation. Could you please call this into the [facility] today.     Thank you,    Melanie Walsh

## 2017-08-31 NOTE — Telephone Encounter (Signed)
From: Melanie Walsh  To: Berta Minor, MD  Sent: 08/30/2017 3:04 PM PDT  Subject: 1-Non Urgent Medical Advice    Hi [name],    Thank you for your help, I received a text message from them this morning saying they were awaiting your approval.     Dr. Rosita Fire is my old Dr, I have not seen her since I started seeing Dr Jannifer Franklin. I'm not sure why she was involved. I will have to check with the pharmacy.     Thank you    Melanie Walsh

## 2017-09-01 ENCOUNTER — Encounter (INDEPENDENT_AMBULATORY_CARE_PROVIDER_SITE_OTHER): Payer: Self-pay | Admitting: Internal Medicine

## 2017-09-01 DIAGNOSIS — Z309 Encounter for contraceptive management, unspecified: Principal | ICD-10-CM

## 2017-09-01 MED ORDER — ESTRADIOL 0.05 MG/24HR TD PTTW
1.0000 | MEDICATED_PATCH | TRANSDERMAL | 5 refills | Status: DC
Start: 2017-09-02 — End: 2018-02-18

## 2017-09-16 ENCOUNTER — Other Ambulatory Visit (INDEPENDENT_AMBULATORY_CARE_PROVIDER_SITE_OTHER): Payer: Self-pay | Admitting: Internal Medicine

## 2017-09-16 DIAGNOSIS — J4520 Mild intermittent asthma, uncomplicated: Principal | ICD-10-CM

## 2017-09-16 MED ORDER — LEVALBUTEROL TARTRATE 45 MCG/ACT IN AERO
2.00 | INHALATION_SPRAY | Freq: Four times a day (QID) | RESPIRATORY_TRACT | 3 refills | Status: DC | PRN
Start: 2017-09-16 — End: 2017-11-16

## 2017-09-16 NOTE — Telephone Encounter (Signed)
Incoming e-request from pharmacy       Established with: Melanie Walsh   Last OV with PCP: 12/23/2016  Next OV with PCP: Visit date not found   Last refill date: 06/18/2017      Requested Medication(s):  Requested Prescriptions     Pending Prescriptions Disp Refills    levalbuterol (XOPENEX) 45 MCG/ACT inhaler [Pharmacy Med Name: LEVALBUTEROL HFA INH (200PF) 15GM] 15 g 0     Sig: INHALE 2 PUFFS BY MOUTH EVERY 6 HOURS AS NEEDED FOR WHEEZING       Send to:     [facility] #[id number] Rhys Martini, [place] - [address] AT [place]  [place]  [place] [address]  Phone: [phone] Fax: [phone]

## 2017-09-21 ENCOUNTER — Telehealth (INDEPENDENT_AMBULATORY_CARE_PROVIDER_SITE_OTHER): Payer: Self-pay | Admitting: Dermatology

## 2017-09-21 NOTE — Telephone Encounter (Signed)
Rcvd PA req from [facility] for:     Elidel 1% cream    Submitted PA through cover my meds    Key: [id number]    Pending approval/denial    Caryl Pina

## 2017-09-22 NOTE — Telephone Encounter (Signed)
Rcvd PA dismissal from [facility] for:    Pimecrolimus 1% cream    PA was previously approved on 01/12/17   Case ID#: [id number]    Called pharmacy and left detailed vm     -Caryl Pina

## 2017-09-23 ENCOUNTER — Encounter (INDEPENDENT_AMBULATORY_CARE_PROVIDER_SITE_OTHER): Payer: Self-pay | Admitting: Dermatology

## 2017-09-23 ENCOUNTER — Other Ambulatory Visit (INDEPENDENT_AMBULATORY_CARE_PROVIDER_SITE_OTHER): Payer: Self-pay | Admitting: Dermatology

## 2017-09-23 DIAGNOSIS — L309 Dermatitis, unspecified: Secondary | ICD-10-CM

## 2017-09-23 MED ORDER — PIMECROLIMUS 1 % EX CREA
1.0000 | TOPICAL_CREAM | Freq: Two times a day (BID) | CUTANEOUS | 3 refills | Status: AC
Start: 2017-09-23 — End: ?

## 2017-09-23 MED ORDER — PIMECROLIMUS 1 % EX CREA
TOPICAL_CREAM | CUTANEOUS | 3 refills | Status: AC
Start: 2017-09-23 — End: ?

## 2017-09-23 NOTE — Telephone Encounter (Signed)
From: Melanie Walsh  To: Altamease Oiler, MD  Sent: 09/23/2017 3:04 PM PDT  Subject: 1-Non Urgent Medical Advice    HI Dr. Lilyan Gilford,    My insurance company is not approving my RX for Elidel. There is a new generic brand out that they want me to fill. I have difficulties with generic brands of medication. All that I have tried make my mouth burn terribly. The pharmacy does not have a record of the authorization from last year either. Could you please send an authorization for the brand name cream.    Thank you,    Melanie Walsh

## 2017-09-23 NOTE — Progress Notes (Signed)
See order  Patient prefers brand name Elidel

## 2017-09-24 NOTE — Telephone Encounter (Signed)
PA submitted through cover my meds for:    BRAND NAME ELIDEL 1% CREAM    Key: Dominic Pea    Pending approval/denial    -Caryl Pina

## 2017-09-28 ENCOUNTER — Encounter (INDEPENDENT_AMBULATORY_CARE_PROVIDER_SITE_OTHER): Payer: Self-pay | Admitting: Dermatology

## 2017-09-28 ENCOUNTER — Ambulatory Visit (INDEPENDENT_AMBULATORY_CARE_PROVIDER_SITE_OTHER): Payer: BC Managed Care – PPO | Admitting: Dermatology

## 2017-09-28 DIAGNOSIS — L739 Follicular disorder, unspecified: Secondary | ICD-10-CM

## 2017-09-28 DIAGNOSIS — L578 Other skin changes due to chronic exposure to nonionizing radiation: Secondary | ICD-10-CM

## 2017-09-28 DIAGNOSIS — L814 Other melanin hyperpigmentation: Secondary | ICD-10-CM

## 2017-09-28 DIAGNOSIS — L309 Dermatitis, unspecified: Secondary | ICD-10-CM

## 2017-09-28 DIAGNOSIS — D1801 Hemangioma of skin and subcutaneous tissue: Secondary | ICD-10-CM

## 2017-09-28 DIAGNOSIS — L570 Actinic keratosis: Secondary | ICD-10-CM

## 2017-09-28 DIAGNOSIS — L821 Other seborrheic keratosis: Secondary | ICD-10-CM

## 2017-09-28 DIAGNOSIS — D229 Melanocytic nevi, unspecified: Secondary | ICD-10-CM

## 2017-09-28 MED ORDER — FLUOROURACIL 5 % EX CREA
TOPICAL_CREAM | CUTANEOUS | 0 refills | Status: AC
Start: 2017-09-28 — End: ?

## 2017-09-28 MED ORDER — FLUOCINONIDE 0.05 % EX SOLN
CUTANEOUS | 5 refills | Status: AC
Start: 2017-09-28 — End: ?

## 2017-09-28 NOTE — Progress Notes (Signed)
[place]   [place], [place] [address]  [phone]     Primary MD: Rose Fillers Bennitt  Consult Requested By: Jenny Reichmann*    had concerns including Recheck (YBSE).    Chief complaint: sun-damaged skin    LOV 12/25/2016    HISTORY:  Melanie Walsh is a 57 year old female here for evaluation of sun-damaged skin.      Lesion of concern today: multiple rough red patches on arms bilaterally. A few were frozen at last visit but never fully resolved. Previously used Efudex to arms in the past. Was old in past to not restart Efudex due to allergies, chronic dermatitis, and petroleum  jelly.   Still with face rash. Recurrent over many years. Using Elidel as needed for flares. Using Burts Bees chapsticks and all natural olive oil wash/lotion.   Lastly notes itchy bumps in scalp.  They come and go. No growth, bleeding, ulceration, pain. No prior treatment.   Otherwise no other itching, bleeding, painful, growing, ulcerating, or concerning lesions.    SOCIAL HISTORY:  Graduated with her masters recently.   Moving to [facility]     PAST DERM HISTORY:  Melanoma hx: negative   NMSC hx: negative   Allergic to most topical things (even vasoline)  Status post Efudex on arms in the past    FAMILY HISTORY:  Family Hx of melanoma: positive   Family hx of skin disease: negative .   Mother had melanoma    PAST MEDICAL HISTORY:  The patient  has a past medical history of Allergic rhinitis, Asthma, DVT (deep venous thrombosis) (CMS-HCC), HTN (hypertension), Kidney disease, medullary sponge, Kidney stones, Pneumonia, Thyroid disease, Trigeminal neuralgia, and UTI (urinary tract infection).        MEDICATIONS:  Medications:   Current Outpatient Medications:     amLODIPINE (NORVASC) 5 MG tablet, Take 5 mg by mouth daily. , Disp: , Rfl:     carBAMazepine (TEGRETOL) 200 MG tablet, Take 1 tablet (200 mg) by mouth daily., Disp: 90 tablet, Rfl: 1    Cholecalciferol (VITAMIN D PO), , Disp: ,  Rfl:     estradiol (VAGIFEM) 10 MCG vaginal tabs, Insert 1 tablet (10 mcg) vaginally twice a week., Disp: 8 tablet, Rfl: 3    estradiol (VIVELLE) 0.05 MG/24HR patch, Apply 1 patch topically twice a week., Disp: 24 patch, Rfl: 5    fluocinonide (LIDEX) 0.05 % solution, Apply a small amount to scalp nightly until symptoms resolved and then as needed for flares., Disp: 60 mL, Rfl: 5    fluorouracil (EFUDEX) 5 % cream, Apply to bilateral forearms twice a day for 2 weeks., Disp: 40 g, Rfl: 0    fluticasone-salmeterol (ADVAIR) 100-50 MCG/DOSE inhaler, Inhale 1 puff by mouth every 12 hours., Disp: 1 Inhaler, Rfl: 2    hydrochlorothiazide (HYDRODIURIL) 25 MG tablet, TAKE 1 TABLET(25 MG) BY MOUTH DAILY, Disp: 90 tablet, Rfl: 2    levalbuterol (XOPENEX) 45 MCG/ACT inhaler, INHALE 2 PUFFS BY MOUTH EVERY 6 HOURS AS NEEDED FOR WHEEZING, Disp: 15 g, Rfl: 3    levothyroxine (SYNTHROID) 50 MCG tablet, TAKE 1 TABLET(50 MCG) BY MOUTH DAILY, Disp: 90 tablet, Rfl: 1    metFORMIN (GLUCOPHAGE XR) 500 MG XR tablet, TAKE 2 TABLETS(1000 MG) BY MOUTH DAILY, Disp: 180 tablet, Rfl: 1    miscellaneous compounded medication, Testosterone 1% cream, apply 0.5 g to skin of forearm, lower abdomen or thigh qam as directed, Disp: 45 g, Rfl: 1  oxybutynin (DITROPAN) 5 MG tablet, Take 1 tablet (5 mg) by mouth 3 times daily as needed (Bladder spasm)., Disp: 21 tablet, Rfl: 0    oxyCODONE-acetaminophen (PERCOCET) 5-325 MG tablet, Take 1 tablet by mouth every 6 hours as needed for Severe Pain (Pain Score 7-10)., Disp: 20 tablet, Rfl: 0    oxyCODONE-acetaminophen (PERCOCET) 5-325 MG tablet, Take 1 tablet by mouth At bedtime as needed for Severe Pain (Pain Score 7-10)., Disp: , Rfl:     phenazopyridine (PYRIDIUM) 200 MG tablet, Take 1 tablet (200 mg) by mouth 3 times daily as needed for Mild Pain (Pain Score 1-3) (urinary frequency, urgency, dysuria)., Disp: 21 tablet, Rfl: 0    pimecrolimus (ELIDEL) 1 % cream, Apply twice daily as needed  for redness on face, Disp: 30 g, Rfl: 3    pimecrolimus (ELIDEL) 1 % cream, Apply 1 Application topically 2 times daily. Use a small amount as directed, Disp: 1 Tube, Rfl: 3    Potassium Citrate ER 15 MEQ (1620 MG) TBCR, Take 15 mEq by mouth 2 times daily., Disp: 180 tablet, Rfl: 3    tamsulosin (FLOMAX) 0.4 MG capsule, Take 1 capsule (0.4 mg) by mouth daily. Take 7 days prior surgery and 7 days after, Disp: 14 capsule, Rfl: 0    tamsulosin (FLOMAX) 0.4 MG capsule, Take 1 capsule (0.4 mg) by mouth daily., Disp: 30 capsule, Rfl: 1    venlafaxine (EFFEXOR XR) 37.5 MG XR capsule, TAKE 1 CAPSULE(37.5 MG) BY MOUTH DAILY, Disp: 30 capsule, Rfl: 3    Allergies: Adhesive tape; Latex; Morphine; Nickel; Petroleum jelly [petrolatum]; Sulfa drugs; Dilaudid  [hydromorphone hcl]; Erythromycin; and Oxycodone-aspirin    REVIEW OF SYSTEMS:  CONSTITUTIONAL: negative for fever or chills  DERMATOLOGIC: Feels well, no other skin complaints.        PHYSICAL EXAM:  Skin Type:                    2  General Appearance: within normal limits, WDWN  Neuro: Alert and oriented x 3  Psych: Mood and affect within normal limits  Eyes: Inspection of lids, sclera, and conjunctiva within normal limits   ENT: Lips pink and symmetrical, gums pink; oral mucosa, hard and soft palates, tongue within normal limits   Extremities: inspection and palpation of digits and nails within normal limits (no clubbing, inflammation, cyanosis)    Skin: The areas examined included scalp and face, neck, RUE, LUE, chest, abdomen, back, RLE, LLE, buttock   Pertinent findings below:  -Around her mouth is scaling, erythema, and mild lichenification   - Few excoriated papules on neck.   - Scalp without suspicious lesions  -Well demarcated, brown, stuck on appearing plaques on head, trunk and extremities  -Scattered even-bordered, even-pigmented macules and papules on the head, trunk and extremities  -Scattered 2-4 mm cherry red macules/papules  -Diffuse gritty  erythematous macules and patches on the arms   -Dyschromia, thinning, loss of elasticity, and wrinkling of the skin in sun exposed areas  -faint feathery tan macules      ASSESSMENT AND TREATMENT PLAN:  1. Actinic keratosis, diffuse on arms  -Tolerated treatment in the past. Advised her to do test spot before applying to both arms.   -A single course of Efudex 5% cream effectively reduces AK counts and the need for spot treatments for longer than 2 years [facility], September 2015); a course of Efudex to face substantially reduces surgery for [facility] for 1 year ([facility] February 2018)  -Begin Efudex (  fluorouracil) cream, apply two times daily x 2 weeks to forearms,  or until scabs form (side effects of extreme irritation,  rarely superinfection, scarring, dyschromia, recurrence, and need for additional treatment discussed and accepted)  -If patient is unsure if reaction is normal, they were encouraged to stop the medication and notify me.      2. Facial dermatitis  Differential diagnosis eczema versus perioral dermatitis  - Start Kiss My Face wash.  - Start Vanicream lotion to face daily. Vanicream for sunscreen   - Continue Elidel prn for flares.     3. Scalp Folliculitis   -Start Lidex 0.05% topical solution nightly as needed. Avoid face. -risks, benefits and adverse reactions of topical steroids discussed with patient including, but not limited to atrophy, striae, telangiectasia, and dyspigmentation.  Patient instructed to avoid face, underarms, groin.  Patient expresses understanding.    4. Seborrheic Keratosis   5. Lentigines   - lesion discussed and reassurance given.  Patient to return if lesion noticeably changes in color or appearance for reevaluation.    6. Cherry Angiomas  - reassurance of benign nature of lesions, questions answered  - no treatment necessary today    7. Multiple Benign Nevi  - reassurance of benign nature of lesions, questions answered  - no treatment necessary today  - return to  clinic for new, changing or symptomatic lesions    8. Sun Damaged Skin and Skin Health Maintenance    - Importance of biannual TBSE by physician reviewed with patient  - Discussed importance of sun protection, including avoidance of sun at peak hours (10 am-4 pm), protective clothing, and use of sunscreen with SPF 30 or greater with frequent reapplication (every 1 to 1.5 hours, or more frequently if in water or if heavy sweating).  Best sunscreens use zinc or titanium oxide as the active ingredient and act as a physical blocker.   - ABCDEs of melanoma reviewed; Signs of new and recurrent NMSC discussed with patient       Return in about 4 weeks (around 10/26/2017) for rash . follow-up Efudex  PRN sooner       [name] Rea, MD       Scribe Attestation  The notes I am recording reflect only actions made by and judgments taken by this provider, Dr. Lilyan Gilford, for whom I am scribing today. I have performed no independent clinical work.    Gaynelle Cage    ____________________________________________________________________    Provider Attestation for Scribed Note    As the attending provider, I agree with the scribed content.  Any changes or edits are noted in the text above.    Governor Specking Jeremian Whitby

## 2017-09-28 NOTE — Patient Instructions (Addendum)
1. Start Kiss My Face soap (can buy at [employer])  2. Purchase Vanicream to use daily on face (can buy at [employer] or [place]).  3. STOP Burts Bees products. Can look for Vanicream chapstick or another chapstick that does not contain menthol/cinammon.   4. Can try Free and Clear shampoo  5. Start Efudex to bilateral forearms. Try a test spot first before applying to entire forearms.   6. Start Lidex solution nightly until symptoms are under control. Then can taper down to twice a week as needed.     Start efudex (5-fluorouracil) to forearms twice a day for about two weeks.  Expect redness and irritation but stop and call us if develop oozing, blistering, ulceration, severe pain.  Use extensive sun protection during this time including sunscreen and a wide brimmed hat.  During this time you should use gentle facial washes such as Dove soap or Cetaphil gentle cleanser to wash your face.  If the reaction that develops becomes painful, make sure to stop applying the efudex.  Apply petroleum jelly ointment/petrolatum or Aquaphor to the sites multiple times a day if needed.  If this does not help with the pain and symptoms, try applying over the counter hydrocortisone 1% ointment twice a day.  If this doesn't help or if you have any questions at all, please call the dermatology clinic.

## 2017-09-30 ENCOUNTER — Other Ambulatory Visit (INDEPENDENT_AMBULATORY_CARE_PROVIDER_SITE_OTHER): Payer: Self-pay | Admitting: Family Medicine

## 2017-09-30 DIAGNOSIS — Z5181 Encounter for therapeutic drug level monitoring: Principal | ICD-10-CM

## 2017-09-30 MED ORDER — CARBAMAZEPINE 200 MG OR TABS
ORAL_TABLET | ORAL | 0 refills | Status: DC
Start: 2017-09-30 — End: 2017-11-02

## 2017-10-01 NOTE — Telephone Encounter (Signed)
Please call pt with status updates on PA.   Pt states a 70$ out of pocket is to high. Would like different brand or a PA approval request.

## 2017-10-04 NOTE — Telephone Encounter (Signed)
Rcvd another fax from [facility] for Elidel 1% cream (BRAND NAME)    Stating PA dismissed because it was previously approved.     Sent fax to [place]

## 2017-10-05 NOTE — Progress Notes (Addendum)
10/05/17  7:40am  Will refer patient to:  Cletis Athens MD  [address] [place] [address]

## 2017-10-16 ENCOUNTER — Other Ambulatory Visit (INDEPENDENT_AMBULATORY_CARE_PROVIDER_SITE_OTHER): Payer: Self-pay | Admitting: Family Medicine

## 2017-10-16 ENCOUNTER — Other Ambulatory Visit (INDEPENDENT_AMBULATORY_CARE_PROVIDER_SITE_OTHER): Payer: Self-pay | Admitting: Internal Medicine

## 2017-10-16 DIAGNOSIS — J4520 Mild intermittent asthma, uncomplicated: Secondary | ICD-10-CM

## 2017-10-18 MED ORDER — WIXELA INHUB 100-50 MCG/DOSE IN AEPB
INHALATION_SPRAY | RESPIRATORY_TRACT | 1 refills | Status: DC
Start: 2017-10-18 — End: 2017-12-13

## 2017-10-18 MED ORDER — AMLODIPINE 10 MG OR TABS
ORAL_TABLET | ORAL | 0 refills | Status: DC
Start: 2017-10-18 — End: 2017-11-15

## 2017-10-18 NOTE — Telephone Encounter (Signed)
Prescription Refill Request     Name of PCP Provider: Berta Minor   Last office visit: 12/23/16  Next office visit: Visit date not found    Requested Prescriptions     Pending Prescriptions Disp Refills    WIXELA INHUB 100-50 MCG/DOSE inhaler [Pharmacy Med Name: WIXELA INHUB DISKUS 100/50MCG 60S]  0     Sig: INHALE 1 PUFF BY MOUTH EVERY 12 HOURS       Last Refill: no dates found    Send to:     [place], [place] - [address] AT [place]  [place]  [place] [address]  Phone: [phone] Fax: [phone]

## 2017-10-20 ENCOUNTER — Other Ambulatory Visit (INDEPENDENT_AMBULATORY_CARE_PROVIDER_SITE_OTHER): Payer: Self-pay | Admitting: Internal Medicine

## 2017-10-20 DIAGNOSIS — Z780 Asymptomatic menopausal state: Secondary | ICD-10-CM

## 2017-10-20 MED ORDER — VENLAFAXINE HCL 37.5 MG OR CP24
37.5000 mg | ORAL_CAPSULE | Freq: Every day | ORAL | 0 refills | Status: DC
Start: 2017-10-20 — End: 2017-11-15

## 2017-10-20 NOTE — Telephone Encounter (Signed)
Medication requested:   Requested Prescriptions     Pending Prescriptions Disp Refills    venlafaxine (EFFEXOR XR) 37.5 MG XR capsule 30 capsule 0     Sig: Take 1 capsule (37.5 mg) by mouth daily.       Date of last refill: 1.19.19 per [place] (provider):  10.24.18      Last OV (department): Visit date not found    Next OV (provider):      Visit date not found  Next OV (department): Visit date not found

## 2017-10-20 NOTE — Telephone Encounter (Signed)
Left message for pt informing her the medication has been refilled.

## 2017-10-20 NOTE — Telephone Encounter (Signed)
Who is calling: Incoming call from patient  Insurance Coverage Verified: Active- in network  Reason for this call: pt calling on status on prescription for venlafaxine (EFFEXOR XR) stated she is out of medication .  Please advise         Action required by office: Please contact caller    Duplicate encounter? No previous documentation found on this issue.     Best way to contact: [phone]      Inquiry has been read verbatim to this caller. Verbalizes satisfaction and confirms the above is accurate: yes      Has been advised this message will be transmitted to office and can expect a response within the next 24-72 hours.

## 2017-11-02 ENCOUNTER — Other Ambulatory Visit (INDEPENDENT_AMBULATORY_CARE_PROVIDER_SITE_OTHER): Payer: Self-pay | Admitting: Family Medicine

## 2017-11-02 DIAGNOSIS — Z5181 Encounter for therapeutic drug level monitoring: Principal | ICD-10-CM

## 2017-11-02 MED ORDER — CARBAMAZEPINE 200 MG OR TABS
ORAL_TABLET | ORAL | 0 refills | Status: DC
Start: 2017-11-02 — End: 2017-11-30

## 2017-11-03 ENCOUNTER — Ambulatory Visit (INDEPENDENT_AMBULATORY_CARE_PROVIDER_SITE_OTHER): Payer: BC Managed Care – PPO | Admitting: Dermatology

## 2017-11-09 ENCOUNTER — Telehealth (INDEPENDENT_AMBULATORY_CARE_PROVIDER_SITE_OTHER): Payer: Self-pay | Admitting: Obesity Medicine

## 2017-11-09 NOTE — Telephone Encounter (Signed)
V/m to call back and schedule new wt consult weight gain, interested in medication options with /dr Cordella Register

## 2017-11-15 ENCOUNTER — Other Ambulatory Visit (INDEPENDENT_AMBULATORY_CARE_PROVIDER_SITE_OTHER): Payer: Self-pay | Admitting: Internal Medicine

## 2017-11-15 ENCOUNTER — Telehealth (INDEPENDENT_AMBULATORY_CARE_PROVIDER_SITE_OTHER): Payer: Self-pay | Admitting: Family Medicine

## 2017-11-15 DIAGNOSIS — Z5181 Encounter for therapeutic drug level monitoring: Principal | ICD-10-CM

## 2017-11-15 DIAGNOSIS — Z780 Asymptomatic menopausal state: Principal | ICD-10-CM

## 2017-11-15 MED ORDER — AMLODIPINE 10 MG OR TABS
ORAL_TABLET | ORAL | 0 refills | Status: AC
Start: 2017-11-15 — End: ?

## 2017-11-15 MED ORDER — VENLAFAXINE HCL 37.5 MG OR CP24
37.50 mg | ORAL_CAPSULE | Freq: Every day | ORAL | 0 refills | Status: AC
Start: 2017-11-15 — End: ?

## 2017-11-15 NOTE — Telephone Encounter (Signed)
Medication requested:   Requested Prescriptions     Pending Prescriptions Disp Refills    venlafaxine (EFFEXOR XR) 37.5 MG XR capsule 90 capsule 0     Sig: Take 1 capsule (37.5 mg) by mouth daily.       Date of last refill: 7.3.19    Last OV (provider): 10.24.18      Last OV (department): Visit date not found    Next OV (provider):      Visit date not found  Next OV (department): Visit date not found

## 2017-11-15 NOTE — Telephone Encounter (Signed)
Patient informed her Rx has been refilled.

## 2017-11-15 NOTE — Telephone Encounter (Signed)
OK, order signed. Safe travels! Thanks.

## 2017-11-15 NOTE — Telephone Encounter (Signed)
Prescription Refill Request     Incoming call from patient requesting refill   Name of PCP Provider: Berta Minor   Last office visit: 12/23/2016  Next office visit: No future appointments.    Allergies   Allergen Reactions    Adhesive Tape Rash    Latex Rash    Morphine Shortness of Breath and Swelling    Nickel Rash     Swelling and rash    Petroleum Jelly [Petrolatum] Rash    Sulfa Drugs Rash     Related topical skin cream    Dilaudid  [Hydromorphone Hcl] Unspecified    Erythromycin Nausea Only    Oxycodone-Aspirin Other     Headaches and nightmares       Requested Medication/s:    venlafaxine (EFFEXOR XR) 37.5 MG XR capsule Take 1 capsule (37.5 mg) by mouth daily. 30 capsule 0     Patient calling in stating she is moving to [place] in 2 days and she is requesting for this medication to be filled one last time until she is able to [place] care with a new provider (patient stating she will be running out of medication on the drive to her new location). Per patient request routing with high priority.    Per patient request sending to Dr. Myles Gip for refill (per patient Dr. Myles Gip manages Rx).      Patient has made an attempt to refill with pharmacy? Unknown    Send to:     [place], [place] - [address] AT [place]  [place]  [place] [address]  Phone: [phone] Fax: [phone]          Has been advised this message will be transmitted to office and if any issues can expect a response within the next 24-72 hours, otherwise pharmacy should be contacting patient when refill is processed.

## 2017-11-16 ENCOUNTER — Other Ambulatory Visit (INDEPENDENT_AMBULATORY_CARE_PROVIDER_SITE_OTHER): Payer: Self-pay | Admitting: Internal Medicine

## 2017-11-16 DIAGNOSIS — Z0189 Encounter for other specified special examinations: Secondary | ICD-10-CM

## 2017-11-16 DIAGNOSIS — J4520 Mild intermittent asthma, uncomplicated: Secondary | ICD-10-CM

## 2017-11-16 NOTE — Telephone Encounter (Signed)
Mychart sent re: due for Annual Physical next month    Prescription Refill Request     Name of PCP Provider: Berta Minor   Last office visit: 12/23/16  Next office visit: Visit date not found    Last Physical Exam: 11/20/16      Requested Prescriptions     Pending Prescriptions Disp Refills    levalbuterol (XOPENEX) 45 MCG/ACT inhaler [Pharmacy Med Name: LEVALBUTEROL HFA INH (200PF) 15GM] 15 g 0     Sig: INHALE 2 PUFFS BY MOUTH EVERY 6 HOURS AS NEEDED FOR WHEEZING       Last Refill: 11/01/17    Send to:     [id number] Rhys Martini, [place] - [address] AT [place]  [place]  [place] [address]  Phone: [phone] Fax: [phone]

## 2017-11-17 MED ORDER — LEVALBUTEROL TARTRATE 45 MCG/ACT IN AERO
2.00 | INHALATION_SPRAY | Freq: Four times a day (QID) | RESPIRATORY_TRACT | 1 refills | Status: AC | PRN
Start: 2017-11-17 — End: ?

## 2017-11-25 ENCOUNTER — Other Ambulatory Visit (INDEPENDENT_AMBULATORY_CARE_PROVIDER_SITE_OTHER): Payer: Self-pay | Admitting: Internal Medicine

## 2017-11-25 DIAGNOSIS — E1169 Type 2 diabetes mellitus with other specified complication: Secondary | ICD-10-CM

## 2017-11-25 DIAGNOSIS — E039 Hypothyroidism, unspecified: Principal | ICD-10-CM

## 2017-11-25 MED ORDER — LEVOTHYROXINE SODIUM 50 MCG OR TABS
ORAL_TABLET | ORAL | 0 refills | Status: DC
Start: 2017-11-25 — End: 2018-02-18

## 2017-11-25 MED ORDER — METFORMIN HCL 500 MG OR TB24
ORAL_TABLET | ORAL | 0 refills | Status: DC
Start: 2017-11-25 — End: 2018-02-21

## 2017-11-25 NOTE — Telephone Encounter (Signed)
Incoming e-request from pharmacy       Established with: Melanie Walsh   Last OV with PCP: 12/23/2016  Next OV with PCP: Visit date not found   Last refill date: 05/26/2017      Requested Medication(s):  Requested Prescriptions     Pending Prescriptions Disp Refills   . levothyroxine (SYNTHROID) 50 MCG tablet [Pharmacy Med Name: LEVOTHYROXINE 0.05MG  (50MCG) TAB] 30 tablet 0     Sig: TAKE 1 TABLET(50 MCG) BY MOUTH DAILY   . metFORMIN (GLUCOPHAGE XR) 500 MG XR tablet [Pharmacy Med Name: METFORMIN ER 500MG  24HR TABS] 60 tablet 0     Sig: TAKE 2 TABLETS(1000 MG) BY MOUTH DAILY       Send to:     [facility] #[id number] Rhys Martini, [place] - [address] AT [place]  [place]  [place] [address]  Phone: [phone] Fax: [phone]

## 2017-11-30 ENCOUNTER — Other Ambulatory Visit (INDEPENDENT_AMBULATORY_CARE_PROVIDER_SITE_OTHER): Payer: Self-pay | Admitting: Family Medicine

## 2017-11-30 DIAGNOSIS — Z5181 Encounter for therapeutic drug level monitoring: Principal | ICD-10-CM

## 2017-11-30 MED ORDER — CARBAMAZEPINE 200 MG OR TABS
ORAL_TABLET | ORAL | 0 refills | Status: DC
Start: 2017-11-30 — End: 2018-01-09

## 2017-12-13 ENCOUNTER — Other Ambulatory Visit (INDEPENDENT_AMBULATORY_CARE_PROVIDER_SITE_OTHER): Payer: Self-pay | Admitting: Internal Medicine

## 2017-12-13 ENCOUNTER — Other Ambulatory Visit (INDEPENDENT_AMBULATORY_CARE_PROVIDER_SITE_OTHER): Payer: Self-pay | Admitting: Family Medicine

## 2017-12-13 DIAGNOSIS — J4520 Mild intermittent asthma, uncomplicated: Secondary | ICD-10-CM

## 2017-12-13 MED ORDER — WIXELA INHUB 100-50 MCG/DOSE IN AEPB
INHALATION_SPRAY | RESPIRATORY_TRACT | 1 refills | Status: AC
Start: 2017-12-13 — End: ?

## 2017-12-13 NOTE — Telephone Encounter (Signed)
Mychart sent re: due for physical.    Prescription Refill Request     Name of PCP Provider: Berta Minor   Last office visit: 12/23/16  Next office visit: Visit date not found    Last Physical Exam: 11/20/16      Requested Prescriptions     Pending Prescriptions Disp Refills   . WIXELA INHUB 100-50 MCG/DOSE inhaler [Pharmacy Med Name: WIXELA INHUB DISKUS 100/50MCG 60S]  0     Sig: INHALE 1 PUFF BY MOUTH EVERY 12 HOURS       Last Refill: 11/15/17    Send to:     [facility] #[id number] Rh[place], [place] - [address] AT [place]  [place]  [place] [address]  Phone: [phone] Fax: [phone]

## 2017-12-15 ENCOUNTER — Encounter (INDEPENDENT_AMBULATORY_CARE_PROVIDER_SITE_OTHER): Payer: Self-pay | Admitting: Family Medicine

## 2018-01-09 ENCOUNTER — Other Ambulatory Visit (INDEPENDENT_AMBULATORY_CARE_PROVIDER_SITE_OTHER): Payer: Self-pay | Admitting: Family Medicine

## 2018-01-09 MED ORDER — CARBAMAZEPINE 200 MG OR TABS
ORAL_TABLET | ORAL | 0 refills | Status: DC
Start: 2018-01-09 — End: 2018-02-10

## 2018-01-10 ENCOUNTER — Encounter: Payer: Self-pay | Admitting: Hospital

## 2018-02-01 ENCOUNTER — Other Ambulatory Visit (INDEPENDENT_AMBULATORY_CARE_PROVIDER_SITE_OTHER): Payer: Self-pay | Admitting: Internal Medicine

## 2018-02-01 DIAGNOSIS — E039 Hypothyroidism, unspecified: Secondary | ICD-10-CM

## 2018-02-01 DIAGNOSIS — E1169 Type 2 diabetes mellitus with other specified complication: Secondary | ICD-10-CM

## 2018-02-10 ENCOUNTER — Other Ambulatory Visit (INDEPENDENT_AMBULATORY_CARE_PROVIDER_SITE_OTHER): Payer: Self-pay | Admitting: Family Medicine

## 2018-02-10 DIAGNOSIS — Z5181 Encounter for therapeutic drug level monitoring: Principal | ICD-10-CM

## 2018-02-10 MED ORDER — CARBAMAZEPINE 200 MG OR TABS
ORAL_TABLET | ORAL | 0 refills | Status: AC
Start: 2018-02-10 — End: ?

## 2018-02-18 ENCOUNTER — Encounter (INDEPENDENT_AMBULATORY_CARE_PROVIDER_SITE_OTHER): Payer: Self-pay | Admitting: Internal Medicine

## 2018-02-18 DIAGNOSIS — Z309 Encounter for contraceptive management, unspecified: Secondary | ICD-10-CM

## 2018-02-18 DIAGNOSIS — Z0189 Encounter for other specified special examinations: Secondary | ICD-10-CM

## 2018-02-18 DIAGNOSIS — E039 Hypothyroidism, unspecified: Secondary | ICD-10-CM

## 2018-02-18 DIAGNOSIS — E1169 Type 2 diabetes mellitus with other specified complication: Secondary | ICD-10-CM

## 2018-02-18 NOTE — Telephone Encounter (Signed)
Replied to [employer] re: due for f/u and labs.    Prescription Refill Request     Name of PCP Provider: Berta Minor   Last office visit: 12/23/16  Next office visit: Visit date not found    Last Physical Exam: 11/20/16      Requested Prescriptions     Pending Prescriptions Disp Refills   . levothyroxine (SYNTHROID) 50 MCG tablet 60 tablet 0     Sig: Take 1 tablet (50 mcg) by mouth every morning (before breakfast).   . metFORMIN (GLUCOPHAGE XR) 500 MG XR tablet 120 tablet 0     Sig: Take 2 tablets (1,000 mg) by mouth daily.   Marland Kitchen estradiol (VIVELLE) 0.05 MG/24HR patch 24 patch 0     Sig: Apply 1 patch topically twice a week.       Last Refill: no dates found    Send to:     [facility], [place] [address] AT [place] [address]  Phone: [phone] Fax: [phone]    Lab Results   Component Value Date    TSH 1.28 02/10/2017     Lab Results   Component Value Date    NA 141 06/04/2017    K 3.9 06/04/2017    CL 100 06/04/2017    BICARB 27 06/04/2017    BUN 13 06/04/2017    CREAT 0.74 06/04/2017    GLU 104 (H) 06/04/2017    Geronimo 9.8 06/04/2017     Lab Results   Component Value Date    A1C 5.3 03/28/2016

## 2018-02-18 NOTE — Telephone Encounter (Signed)
From: Melanie Walsh  To: Berta Minor, MD  Sent: 02/18/2018 9:02 AM PDT  Subject: 1-Non Urgent Medical Advice    Good Morning Dr. Jannifer Franklin,    I am currently in [place] and will not be returning to [place] until after the Holidays. [facility] is trying to refill my regular medications but is running into trouble, there is a confusion as to who my primary doctor is. I was wondering if you could please refill my levothyroxine, Metformin and my vivel dot hormone patch. I would greatly appreciate your help with this matter. As soon as I get back I will schedule a regular annual physical with you.    Thank you in advance for your help,    Melanie Walsh

## 2018-02-21 MED ORDER — ESTRADIOL 0.05 MG/24HR TD PTTW
1.0000 | MEDICATED_PATCH | TRANSDERMAL | 0 refills | Status: AC
Start: 2018-02-21 — End: ?

## 2018-02-21 MED ORDER — LEVOTHYROXINE SODIUM 50 MCG OR TABS
50.0000 ug | ORAL_TABLET | Freq: Every day | ORAL | 0 refills | Status: AC
Start: 2018-02-21 — End: ?

## 2018-02-21 MED ORDER — METFORMIN HCL 500 MG OR TB24
500.0000 mg | ORAL_TABLET | Freq: Every day | ORAL | 0 refills | Status: DC
Start: 2018-02-21 — End: 2018-03-22

## 2018-03-11 ENCOUNTER — Other Ambulatory Visit (INDEPENDENT_AMBULATORY_CARE_PROVIDER_SITE_OTHER): Payer: Self-pay | Admitting: Internal Medicine

## 2018-03-11 DIAGNOSIS — Z5181 Encounter for therapeutic drug level monitoring: Principal | ICD-10-CM

## 2018-03-22 ENCOUNTER — Other Ambulatory Visit (INDEPENDENT_AMBULATORY_CARE_PROVIDER_SITE_OTHER): Payer: Self-pay | Admitting: Internal Medicine

## 2018-03-22 DIAGNOSIS — E1169 Type 2 diabetes mellitus with other specified complication: Secondary | ICD-10-CM

## 2018-03-22 MED ORDER — METFORMIN HCL 500 MG OR TB24
ORAL_TABLET | ORAL | 2 refills | Status: AC
Start: 2018-03-22 — End: ?

## 2018-03-22 NOTE — Telephone Encounter (Signed)
Prescription Refill Request     Name of PCP Provider: Berta Minor   Last office visit: 12/23/2016  Next office visit: Visit date not found    Requested Prescriptions     Pending Prescriptions Disp Refills   . metFORMIN (GLUCOPHAGE XR) 500 MG XR tablet [Pharmacy Med Name: METFORMIN ER 500MG  24HR TABS] 30 tablet 0     Sig: TAKE 1 TABLET(500 MG) BY MOUTH DAILY       Last Refill: No date in system       Send to:     [facility] [place], [place] [address] AT [place] [address]  Phone: [phone] Fax: [phone]

## 2018-05-09 ENCOUNTER — Other Ambulatory Visit (INDEPENDENT_AMBULATORY_CARE_PROVIDER_SITE_OTHER): Payer: Self-pay | Admitting: Internal Medicine

## 2018-05-09 DIAGNOSIS — Z309 Encounter for contraceptive management, unspecified: Principal | ICD-10-CM

## 2018-05-10 NOTE — Telephone Encounter (Signed)
Received refill request for estradiol (VIVELLE) 0.05 MG/24HR patch  [facility] pharmacy in [place].  Mychart sent to clarify.

## 2018-06-13 NOTE — Telephone Encounter (Signed)
Letter sent re: unread Mychart message.  LOV: 12/23/2016; no appointment scheduled.  [place]    Please approve or refuse medication.

## 2018-07-19 ENCOUNTER — Emergency Department (HOSPITAL_COMMUNITY): Payer: BLUE CROSS/BLUE SHIELD

## 2018-07-19 ENCOUNTER — Encounter (HOSPITAL_COMMUNITY): Payer: Self-pay

## 2018-07-19 ENCOUNTER — Emergency Department (HOSPITAL_COMMUNITY)
Admission: EM | Admit: 2018-07-19 | Discharge: 2018-07-20 | Disposition: A | Discharge status: Home / Self Care | Payer: BLUE CROSS/BLUE SHIELD | Attending: Emergency Medicine | Admitting: Emergency Medicine

## 2018-07-19 ENCOUNTER — Other Ambulatory Visit: Payer: Self-pay

## 2018-07-19 DIAGNOSIS — R51 Headache: Secondary | ICD-10-CM | POA: Insufficient documentation

## 2018-07-19 DIAGNOSIS — R0789 Other chest pain: Secondary | ICD-10-CM | POA: Diagnosis not present

## 2018-07-19 DIAGNOSIS — J069 Acute upper respiratory infection, unspecified: Secondary | ICD-10-CM | POA: Diagnosis not present

## 2018-07-19 DIAGNOSIS — F17210 Nicotine dependence, cigarettes, uncomplicated: Secondary | ICD-10-CM | POA: Insufficient documentation

## 2018-07-19 DIAGNOSIS — J029 Acute pharyngitis, unspecified: Secondary | ICD-10-CM | POA: Insufficient documentation

## 2018-07-19 DIAGNOSIS — Z86718 Personal history of other venous thrombosis and embolism: Secondary | ICD-10-CM | POA: Diagnosis not present

## 2018-07-19 DIAGNOSIS — J45901 Unspecified asthma with (acute) exacerbation: Secondary | ICD-10-CM | POA: Insufficient documentation

## 2018-07-19 DIAGNOSIS — R0602 Shortness of breath: Secondary | ICD-10-CM | POA: Insufficient documentation

## 2018-07-19 DIAGNOSIS — I10 Essential (primary) hypertension: Secondary | ICD-10-CM | POA: Diagnosis not present

## 2018-07-19 DIAGNOSIS — E119 Type 2 diabetes mellitus without complications: Secondary | ICD-10-CM | POA: Diagnosis not present

## 2018-07-19 DIAGNOSIS — B9789 Other viral agents as the cause of diseases classified elsewhere: Secondary | ICD-10-CM

## 2018-07-19 DIAGNOSIS — R05 Cough: Secondary | ICD-10-CM | POA: Diagnosis present

## 2018-07-19 HISTORY — DX: Type 2 diabetes mellitus without complications: E11.9

## 2018-07-19 HISTORY — DX: Essential (primary) hypertension: I10

## 2018-07-19 LAB — CBC WITH DIFFERENTIAL/PLATELET
Abs Immature Granulocytes: 0.03 10*3/uL (ref 0.00–0.07)
Basophils Absolute: 0.1 10*3/uL (ref 0.0–0.1)
Basophils Relative: 1 %
Eosinophils Absolute: 0.1 10*3/uL (ref 0.0–0.5)
Eosinophils Relative: 1 %
HCT: 45.1 % (ref 36.0–46.0)
HEMOGLOBIN: 15.1 g/dL — AB (ref 12.0–15.0)
Immature Granulocytes: 0 %
LYMPHS ABS: 1.5 10*3/uL (ref 0.7–4.0)
LYMPHS PCT: 18 %
MCH: 33.1 pg (ref 26.0–34.0)
MCHC: 33.5 g/dL (ref 30.0–36.0)
MCV: 98.9 fL (ref 80.0–100.0)
Monocytes Absolute: 0.4 10*3/uL (ref 0.1–1.0)
Monocytes Relative: 5 %
Neutro Abs: 6 10*3/uL (ref 1.7–7.7)
Neutrophils Relative %: 75 %
Platelets: 203 10*3/uL (ref 150–400)
RBC: 4.56 MIL/uL (ref 3.87–5.11)
RDW: 12.9 % (ref 11.5–15.5)
WBC: 8.1 10*3/uL (ref 4.0–10.5)
nRBC: 0 % (ref 0.0–0.2)

## 2018-07-19 LAB — GROUP A STREP BY PCR: Group A Strep by PCR: NOT DETECTED

## 2018-07-19 LAB — D-DIMER, QUANTITATIVE: D-Dimer, Quant: 0.55 ug/mL-FEU — ABNORMAL HIGH (ref 0.00–0.50)

## 2018-07-19 MED ORDER — ONDANSETRON HCL 4 MG/2ML IJ SOLN
4.0000 mg | Freq: Once | INTRAMUSCULAR | Status: AC
  Administered 2018-07-19: 4 mg via INTRAVENOUS
  Filled 2018-07-19: qty 2

## 2018-07-19 MED ORDER — PREDNISONE 20 MG PO TABS
60.0000 mg | ORAL_TABLET | Freq: Once | ORAL | Status: AC
  Administered 2018-07-19: 60 mg via ORAL
  Filled 2018-07-19: qty 3

## 2018-07-19 MED ORDER — IOPAMIDOL (ISOVUE-370) INJECTION 76%
INTRAVENOUS | Status: AC
  Filled 2018-07-19: qty 100

## 2018-07-19 MED ORDER — ALBUTEROL SULFATE HFA 108 (90 BASE) MCG/ACT IN AERS
4.0000 | INHALATION_SPRAY | Freq: Once | RESPIRATORY_TRACT | Status: AC
  Administered 2018-07-19: 4 via RESPIRATORY_TRACT

## 2018-07-19 MED ORDER — PREDNISONE 20 MG PO TABS: 40.0000 mg | ORAL_TABLET | Freq: Every day | ORAL | 0 refills | Status: AC

## 2018-07-19 MED ORDER — ALBUTEROL SULFATE (2.5 MG/3ML) 0.083% IN NEBU: 2.5000 mg | INHALATION_SOLUTION | Freq: Four times a day (QID) | RESPIRATORY_TRACT | 12 refills | Status: DC | PRN

## 2018-07-19 MED ORDER — FENTANYL CITRATE (PF) 100 MCG/2ML IJ SOLN
50.0000 ug | Freq: Once | INTRAMUSCULAR | Status: AC
  Administered 2018-07-19: 50 ug via INTRAVENOUS
  Filled 2018-07-19: qty 2

## 2018-07-19 MED ORDER — ALBUTEROL SULFATE HFA 108 (90 BASE) MCG/ACT IN AERS: 1.0000 | INHALATION_SPRAY | Freq: Four times a day (QID) | RESPIRATORY_TRACT | 0 refills | Status: DC | PRN

## 2018-07-19 MED ORDER — ALBUTEROL SULFATE HFA 108 (90 BASE) MCG/ACT IN AERS
6.0000 | INHALATION_SPRAY | Freq: Once | RESPIRATORY_TRACT | Status: AC
  Administered 2018-07-19: 6 via RESPIRATORY_TRACT
  Filled 2018-07-19: qty 6.7

## 2018-07-19 MED ORDER — SODIUM CHLORIDE 0.9 % IV BOLUS
1000.0000 mL | Freq: Once | INTRAVENOUS | Status: AC
  Administered 2018-07-19: 1000 mL via INTRAVENOUS

## 2018-07-19 NOTE — Discharge Instructions (Signed)
Take prednisone as directed.  Use inhalers as directed.  As we discussed, use nebulizer as directed.  You can use can wellness group to establish a primary care doctor.  At this time, you do not meet any criteria for testing for, 19.  As we discussed, you should self quarantine and isolate given symptoms.   Return emergency department for any difficulty breathing, high fever despite medication, chest pain or any other worsening or concerning symptoms.  Coronavirus (COVID-19) Are you at risk?  Are you at risk for the Coronavirus (COVID-19)?  To be considered HIGH RISK for Coronavirus (COVID-19), you have to meet the following criteria:   Traveled to [place], [place], [place], [place] or [place]; or in the [place] to [place], [place], [place], or [place]; and have fever, cough, and shortness of breath within the last 2 weeks of travel OR  Been in close contact with a person diagnosed with COVID-19 within the last 2 weeks and have fever, cough, and shortness of breath  IF YOU DO NOT MEET THESE CRITERIA, YOU ARE CONSIDERED LOW RISK FOR COVID-19.  What to do if you are HIGH RISK for COVID-19?   If you are having a medical emergency, call 911.  Seek medical care right away. Before you go to a doctors office, urgent care or emergency department, call ahead and tell them about your recent travel, contact with someone diagnosed with COVID-19, and your symptoms. You should receive instructions from your physicians office regarding next steps of care.   When you arrive at healthcare provider, tell the healthcare staff immediately you have returned from visiting [place], [place], [place], [place] or [place]; or traveled in the [place] to [place], [place], [place], or [place]; in the last two weeks or you have been in close contact with a person diagnosed with COVID-19 in the last 2 weeks.    Tell the health care staff about your symptoms: fever, cough and shortness of  breath.  After you have been seen by a medical provider, you will be either: o Tested for (COVID-19) and discharged home on quarantine except to seek medical care if symptoms worsen, and asked to  - Stay home and avoid contact with others until you get your results (4-5 days)  - Avoid travel on public transportation if possible (such as bus, train, or airplane) or o Sent to the Emergency Department by EMS for evaluation, COVID-19 testing, and possible admission depending on your condition and test results.  What to do if you are LOW RISK for COVID-19?  Reduce your risk of any infection by using the same precautions used for avoiding the common cold or flu:   Wash your hands often with soap and warm water for at least 20 seconds.  If soap and water are not readily available, use an alcohol-based hand sanitizer with at least 60% alcohol.   If coughing or sneezing, cover your mouth and nose by coughing or sneezing into the elbow areas of your shirt or coat, into a tissue or into your sleeve (not your hands).  Avoid shaking hands with others and consider head nods or verbal greetings only.  Avoid touching your eyes, nose, or mouth with unwashed hands.   Avoid close contact with people who are sick.  Avoid places or events with large numbers of people in one location, like concerts or sporting events.  Carefully consider travel plans you have or are making.  If you are planning any travel  outside or inside the [place], visit the [facility] webpage for the latest health notices.  If you have some symptoms but not all symptoms, continue to monitor at home and seek medical attention if your symptoms worsen.  If you are having a medical emergency, call 911.   ADDITIONAL HEALTHCARE OPTIONS FOR PATIENTS  [facility] / e-Visit: [email or web address]         [facility]: [phone]  [facility]: [phone]                    [facility]: [phone]

## 2018-07-19 NOTE — ED Triage Notes (Signed)
Pt here for a cough, sore throat, and headache since last Thursday.  Pt states sore throat is getting progressively worse.  Headache off and on treated with advil at the house.  A&Ox4,.

## 2018-07-19 NOTE — ED Provider Notes (Signed)
[facility] EMERGENCY DEPARTMENT Provider Note   CSN: [id number] Arrival date & time: 07/19/18  1913    History   Chief Complaint Chief Complaint  Patient presents with   Cough   Asthma   Headache    HPI Megan [name] is a 58 y.o. female with PMH/o DM, HTN who presents for evaluation of cough, sore throat, headache that is been ongoing for last 5 days.  Patient states cough is productive of green phlegm.  Patient states that she feels like she has been wheezing at night and in the morning.  She has been using her inhalers but has not noted much improvement.  Patient states that she she feels some associated chest tightness/heaviness.  States it is mildly worse with deep inspiration.  She states that this is typical with her asthma exacerbations.  Patient states that she has not noted any fever.  Patient reports that sore throat is slightly improved by taking Advil but then returns.  She states she is tolerating her secretions and able to tolerate liquids but does report worsening pain with doing so.  Patient states her husband works in [employer] and has had some cough but otherwise denies any fever, difficulty breathing.  She has not had any other sick contacts.  Patient also reports a headache that is been ongoing for last 5 days.  States headache started gradual and progressively worsened.  No thunderclap headache.  No preceding trauma, injury, fall.  She is not currently on blood thinners.  She states she takes Advil and the pain is temporarily improved.  Patient states that right now the headache is a 5/10.  Patient recently moved here from [facility].  Patient otherwise denies any travel.  Patient denies any abdominal pain, nausea/vomiting, numbness/weakness of arms or legs, vision changes. She denies any recent immobilization, recent surgery, leg swelling, or long travel greater than 6 hours. She is on transdermal estrogen patch. 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     The history is provided by the patient.    Past Medical History:  Diagnosis Date   Diabetes mellitus without complication (HCC)    Hypertension     There are no active problems to display for this patient.   Past Surgical History:  Procedure Laterality Date   ABDOMINAL HYSTERECTOMY       OB History   No obstetric history on file.      Home Medications    Prior to Admission medications   Medication Sig Start Date End Date Taking? Authorizing Provider  albuterol (PROVENTIL HFA;VENTOLIN HFA) 108 (90 Base) MCG/ACT inhaler Inhale 1-2 puffs into the lungs every 6 (six) hours as needed for wheezing or shortness of breath. 07/19/18   Maxwell Caul, PA-C  albuterol (PROVENTIL) (2.5 MG/3ML) 0.083% nebulizer solution Take 3 mLs (2.5 mg total) by nebulization every 6 (six) hours as needed for wheezing or shortness of breath. 07/19/18   Maxwell Caul, PA-C  predniSONE (DELTASONE) 20 MG tablet Take 2 tablets (40 mg total) by mouth daily for 4 days. 07/19/18 07/23/18  Maxwell Caul, PA-C    Family History History reviewed. No pertinent family history.  Social History Social History   Tobacco Use   Smoking status: Current Some Day Smoker   Smokeless tobacco: Never Used  Substance Use Topics   Alcohol use: Yes   Drug use: Never     Allergies   Patient has no known allergies.   Review of Systems  Review of Systems  Constitutional: Negative for fever.  HENT: Positive for sore throat.   Respiratory: Positive for cough, chest tightness, shortness of breath and wheezing.   Cardiovascular: Negative for chest pain.  Gastrointestinal: Negative for abdominal pain, nausea and vomiting.  Genitourinary: Negative for dysuria and hematuria.  Neurological: Negative for headaches.     Physical Exam Updated Vital Signs BP (!) 146/87    Pulse 87    Temp 98.1 F (36.7 C) (Oral)    Resp 13    SpO2 100%   Physical Exam Vitals signs and nursing note reviewed.    Constitutional:      Appearance: Normal appearance. She is well-developed.  HENT:     Head: Normocephalic and atraumatic.     Mouth/Throat:     Pharynx: Posterior oropharyngeal erythema present.     Comments: Posterior oropharynx is erythematous. No exudates. No edema. Uvula is midline.  No trismus. Airway is patent, phonation is intact. Eyes:     General: Lids are normal.     Conjunctiva/sclera: Conjunctivae normal.     Pupils: Pupils are equal, round, and reactive to light.  Neck:     Musculoskeletal: Full passive range of motion without pain.  Cardiovascular:     Rate and Rhythm: Normal rate and regular rhythm.     Pulses: Normal pulses.     Heart sounds: Normal heart sounds. No murmur. No friction rub. No gallop.   Pulmonary:     Effort: Pulmonary effort is normal.     Breath sounds: Normal breath sounds. No wheezing.     Comments: Lungs clear to auscultation bilaterally.  Symmetric chest rise.  No wheezing, rales, rhonchi. Able to speak in full sentences without any difficulty.  Abdominal:     Palpations: Abdomen is soft. Abdomen is not rigid.     Tenderness: There is no abdominal tenderness. There is no guarding.     Comments: Abdomen is soft, non-distended, non-tender. No rigidity, No guarding. No peritoneal signs.  Musculoskeletal: Normal range of motion.     Comments: Bilateral lower extremities are symmetric in appearance. No overlying warmth, erythema, edema.   Skin:    General: Skin is warm and dry.     Capillary Refill: Capillary refill takes less than 2 seconds.  Neurological:     Mental Status: She is alert and oriented to person, place, and time.  Psychiatric:        Speech: Speech normal.      ED Treatments / Results  Labs (all labs ordered are listed, but only abnormal results are displayed) Labs Reviewed  BASIC METABOLIC PANEL - Abnormal; Notable for the following components:      Result Value   CO2 20 (*)    All other components within normal limits   CBC WITH DIFFERENTIAL/PLATELET - Abnormal; Notable for the following components:   Hemoglobin 15.1 (*)    All other components within normal limits  D-DIMER, QUANTITATIVE (NOT AT [facility]) - Abnormal; Notable for the following components:   D-Dimer, Quant 0.55 (*)    All other components within normal limits  GROUP A STREP BY PCR    EKG EKG Interpretation  Date/Time:  Tuesday July 19 2018 23:11:31 EDT Ventricular Rate:  92 PR Interval:    QRS Duration: 97 QT Interval:  364 QTC Calculation: 451 R Axis:   51 Text Interpretation:  Sinus rhythm Confirmed by Loren Racer ([id number]) on 07/19/2018 11:18:03 PM Also confirmed by Loren Racer ([id number]), editor Josephine Igo [phone])  on  07/20/2018 8:42:07 AM   Radiology Ct Angio Chest Pe W And/or Wo Contrast  Result Date: 07/20/2018 CLINICAL DATA:  58 year old female with cough. Concern for pulmonary embolism. EXAM: CT ANGIOGRAPHY CHEST WITH CONTRAST TECHNIQUE: Multidetector CT imaging of the chest was performed using the standard protocol during bolus administration of intravenous contrast. Multiplanar CT image reconstructions and MIPs were obtained to evaluate the vascular anatomy. CONTRAST:  75mL ISOVUE-370 IOPAMIDOL (ISOVUE-370) INJECTION 76% COMPARISON:  Chest radiograph dated 07/19/2018 FINDINGS: Cardiovascular: There is no cardiomegaly or pericardial effusion. The thoracic aorta appears unremarkable. There is no CT evidence of pulmonary embolism. Mediastinum/Nodes: No hilar or mediastinal adenopathy. The esophagus is grossly unremarkable. No mediastinal fluid collection. Lungs/Pleura: Several scattered pulmonary nodules measure up to 4 mm in the right middle lobe. There is no focal consolidation, pleural effusion, or pneumothorax. The central airways are patent. Upper Abdomen: No acute abnormality. Musculoskeletal: No chest wall abnormality. No acute or significant osseous findings. Review of the MIP images confirms the above findings.  IMPRESSION: No acute intrathoracic pathology. No CT evidence of pulmonary embolism. Electronically Signed   By: Elgie CollardArash  Radparvar M.D.   On: 07/20/2018 01:45   Dg Chest Portable 1 View  Result Date: 07/19/2018 CLINICAL DATA:  Cough and shortness of breath with fever. EXAM: PORTABLE CHEST 1 VIEW COMPARISON:  None. FINDINGS: The heart size and mediastinal contours are within normal limits. Both lungs are clear. The visualized skeletal structures are unremarkable. IMPRESSION: No active disease. Electronically Signed   By: Elsie StainJohn T Curnes M.D.   On: 07/19/2018 21:17    Procedures Procedures (including critical care time)  Medications Ordered in ED Medications  predniSONE (DELTASONE) tablet 60 mg (60 mg Oral Given 07/19/18 2114)  albuterol (PROVENTIL HFA;VENTOLIN HFA) 108 (90 Base) MCG/ACT inhaler 6 puff (6 puffs Inhalation Given 07/19/18 2114)  fentaNYL (SUBLIMAZE) injection 50 mcg (50 mcg Intravenous Given 07/19/18 2330)  ondansetron (ZOFRAN) injection 4 mg (4 mg Intravenous Given 07/19/18 2330)  albuterol (PROVENTIL HFA;VENTOLIN HFA) 108 (90 Base) MCG/ACT inhaler 4 puff (4 puffs Inhalation Given 07/19/18 2309)  sodium chloride 0.9 % bolus 1,000 mL (0 mLs Intravenous Stopped 07/20/18 0114)  iopamidol (ISOVUE-370) 76 % injection 75 mL (75 mLs Intravenous Contrast Given 07/20/18 0127)     Initial Impression / Assessment and Plan / ED Course  I have reviewed the triage vital signs and the nursing notes.  Pertinent labs & imaging results that were available during my care of the patient were reviewed by me and considered in my medical decision making (see chart for details).        58 y.o. F with PMH/o asthma, DM who presents for evaluation of cough, sore throat, headache that has been ongoing for the last 5 days. [place] She is on transdermal estrogen patch. Patient is afebrile, non-toxic appearing, sitting comfortably on examination table. Vital signs reviewed and stable.   On exam, she is intermittently coughing but no obvious wheezing.  No evidence of respiratory distress.  Posterior pharynx is erythematous.  Consider pharyngitis.  History/physical exam not concerning for Ludwig angina, peritonsillar abscess. Consider asthma exacerbation versus infectious process.  Patient with no recent travel.  He states her husband works at [employer] [place] and has similar symptoms but otherwise denies any sick contacts.  Plan to check chest x-ray, strep.  At this time, patient does not meet criteria for COVID-19 testing.  Additionally, history/physical exam not concerning for CVA, meningitis, ICH.  Chest x-ray reviewed.  Negative for infectious etiology.  Strep reviewed.  Negative for infectious etiology.  Discussed results with patient.  She felt like cough proved after initial inhaler, prednisone.  Still feels like she has some shortness of breath and chest pain.  She is not hypoxic or tachycardic but is slightly tachypneic. Given that she is on estrogen patches and does not have wheezing, will plan for labs, including D Dimer. Additionally, I discussed with patient regarding her headache and reassuring neuro exam.  This time, I do not believe that her headache is cause for meningitis, intracranial hemorrhage.  I did offer to do a head CT given that she has no history.  Patient declined head CT at this time.   D-dimer positive.  Given positive d-dimer, will plan to proceed with CTA of chest.  Patient updated on plan.  She is agreeable.  Signed out to [employer], PA-C pending CTA of chest and blood work.  Please see his note for further ED course.  Portions of this note were generated with Scientist, clinical (histocompatibility and immunogenetics). Dictation errors may occur despite best attempts at proofreading.    Final Clinical Impressions(s) / ED Diagnoses   Final diagnoses:  Exacerbation of asthma, unspecified asthma severity, unspecified whether persistent  Viral URI with cough     ED Discharge Orders         Ordered    predniSONE (DELTASONE) 20 MG tablet  Daily     07/19/18 2335    albuterol (PROVENTIL HFA;VENTOLIN HFA) 108 (90 Base) MCG/ACT inhaler  Every 6 hours PRN     07/19/18 2335    albuterol (PROVENTIL) (2.5 MG/3ML) 0.083% nebulizer solution  Every 6 hours PRN     07/19/18 2335    DME Nebulizer machine     07/19/18 2335           Maxwell Caul, PA-C 07/20/18 2313    Loren Racer, MD 07/23/18 1720

## 2018-07-19 NOTE — ED Notes (Signed)
IV attempted with no success. 

## 2018-07-20 ENCOUNTER — Emergency Department (HOSPITAL_COMMUNITY): Payer: BLUE CROSS/BLUE SHIELD

## 2018-07-20 LAB — BASIC METABOLIC PANEL
Anion gap: 11 (ref 5–15)
BUN: 19 mg/dL (ref 6–20)
CHLORIDE: 104 mmol/L (ref 98–111)
CO2: 20 mmol/L — ABNORMAL LOW (ref 22–32)
Calcium: 9.3 mg/dL (ref 8.9–10.3)
Creatinine, Ser: 0.73 mg/dL (ref 0.44–1.00)
GFR calc Af Amer: >60 mL/min (ref >60)
GFR calc non Af Amer: >60 mL/min (ref >60)
Glucose, Bld: 94 mg/dL (ref 70–99)
POTASSIUM: 4 mmol/L (ref 3.5–5.1)
Sodium: 135 mmol/L (ref 135–145)

## 2018-07-20 MED ORDER — IOPAMIDOL (ISOVUE-370) INJECTION 76%
75.0000 mL | Freq: Once | INTRAVENOUS | Status: AC | PRN
  Administered 2018-07-20: 75 mL via INTRAVENOUS

## 2018-07-20 NOTE — ED Notes (Signed)
Patient transported to CT 

## 2018-09-16 ENCOUNTER — Telehealth (INDEPENDENT_AMBULATORY_CARE_PROVIDER_SITE_OTHER): Payer: Self-pay | Admitting: Dermatology

## 2018-09-16 NOTE — Telephone Encounter (Signed)
Pa through covermymeds Key: [id number]    RE:    Melanie Walsh   Date of Birth:  1961/04/10   Drug:     PIMECROLIMUS CREAM (G)   Member ID#:  [id number]   Case ID#:        [id number]   Dear Dr. Lilyan Gilford,      We are writing to let you know that we have approved your request for PIMECROLIMUS for [facility] as it meets our drug coverage requirements.   This approval is valid from 09/16/2018 to 04/19/9998, and the number of refills is 999

## 2018-09-18 ENCOUNTER — Other Ambulatory Visit (INDEPENDENT_AMBULATORY_CARE_PROVIDER_SITE_OTHER): Payer: Self-pay | Admitting: Internal Medicine

## 2018-09-18 DIAGNOSIS — I10 Essential (primary) hypertension: Secondary | ICD-10-CM

## 2018-09-19 NOTE — Telephone Encounter (Signed)
Left message requesting a call back.    Encounter routed to front desk; please follow-up- [place]; need to know if she is no longer seeing PCP.    Cc: PCP; please refuse medication.

## 2018-09-20 NOTE — Telephone Encounter (Addendum)
*  2nd/3rd attempt*    Lm for pt to cb to verify per msg below    Sent mychart msg    CC:if pt calls back please assist.

## 2018-09-27 ENCOUNTER — Encounter (INDEPENDENT_AMBULATORY_CARE_PROVIDER_SITE_OTHER): Payer: Self-pay | Admitting: Internal Medicine

## 2018-11-02 DIAGNOSIS — R7303 Prediabetes: Secondary | ICD-10-CM | POA: Insufficient documentation

## 2018-11-11 DIAGNOSIS — Z86718 Personal history of other venous thrombosis and embolism: Secondary | ICD-10-CM | POA: Insufficient documentation

## 2018-11-11 DIAGNOSIS — M1711 Unilateral primary osteoarthritis, right knee: Secondary | ICD-10-CM | POA: Insufficient documentation

## 2018-11-11 DIAGNOSIS — J4520 Mild intermittent asthma, uncomplicated: Secondary | ICD-10-CM | POA: Insufficient documentation

## 2018-11-11 DIAGNOSIS — Z96651 Presence of right artificial knee joint: Secondary | ICD-10-CM | POA: Insufficient documentation

## 2018-11-11 DIAGNOSIS — Z8759 Personal history of other complications of pregnancy, childbirth and the puerperium: Secondary | ICD-10-CM | POA: Insufficient documentation

## 2018-11-11 HISTORY — PX: TOTAL KNEE ARTHROPLASTY: SHX125

## 2018-11-12 DIAGNOSIS — I951 Orthostatic hypotension: Secondary | ICD-10-CM | POA: Insufficient documentation

## 2018-12-01 ENCOUNTER — Encounter (INDEPENDENT_AMBULATORY_CARE_PROVIDER_SITE_OTHER): Payer: Self-pay | Admitting: Hospital

## 2018-12-19 ENCOUNTER — Encounter (INDEPENDENT_AMBULATORY_CARE_PROVIDER_SITE_OTHER): Payer: Self-pay | Admitting: Hospital

## 2018-12-29 ENCOUNTER — Other Ambulatory Visit: Payer: Self-pay

## 2018-12-29 ENCOUNTER — Ambulatory Visit: Payer: BLUE CROSS/BLUE SHIELD | Attending: Orthopaedic Surgery | Admitting: Physical Therapy

## 2018-12-29 ENCOUNTER — Encounter: Payer: Self-pay | Admitting: Physical Therapy

## 2018-12-29 DIAGNOSIS — M25661 Stiffness of right knee, not elsewhere classified: Secondary | ICD-10-CM | POA: Diagnosis not present

## 2018-12-29 DIAGNOSIS — M6281 Muscle weakness (generalized): Secondary | ICD-10-CM | POA: Diagnosis not present

## 2018-12-29 DIAGNOSIS — M25561 Pain in right knee: Secondary | ICD-10-CM | POA: Diagnosis present

## 2018-12-29 DIAGNOSIS — R262 Difficulty in walking, not elsewhere classified: Secondary | ICD-10-CM | POA: Diagnosis not present

## 2018-12-29 DIAGNOSIS — R600 Localized edema: Secondary | ICD-10-CM | POA: Diagnosis not present

## 2018-12-29 NOTE — Therapy (Signed)
[facility] [address] [place], [place], [address] Phone: [phone]   Fax:  [phone]  Physical Therapy Evaluation  Patient Details  Name: Megan CottaKim [name] MRN: [id number] Date of Birth: Aug 31, 1960 Referring Provider (PT): Carroll SageHoward Homesley, MD   Encounter Date: 12/29/2018  PT End of Session - 12/29/18 2006    Visit Number  1    Number of Visits  18    Date for PT Re-Evaluation  02/09/19    Authorization Type  BCBS-21 remaining visits (30 visits with 9 previously used)    PT Start Time  1716    PT Stop Time  1808    PT Time Calculation (min)  52 min    Activity Tolerance  Patient tolerated treatment well    Behavior During Therapy  [facility] for tasks assessed/performed       Past Medical History:  Diagnosis Date  . Asthma   . Diabetes mellitus without complication (HCC)   . Hypertension   . Thyroid disease     Past Surgical History:  Procedure Laterality Date  . ABDOMINAL HYSTERECTOMY    . APPENDECTOMY    . FEMORAL OSTEOTOMY W/ APPLICATION EXTERNAL FIXATOR Right   . ROTATOR CUFF REPAIR      There were no vitals filed for this visit.   Subjective Assessment - 12/29/18 1947    Subjective  Pt. is a 58 y/o female who underwent right robotic TKA secondary to prolonged history of chronic knee pain issues with history femoral osteotomy. She is currently ambulatory with SPC. Of note she has been having some ongoing issues with sensitivity in region of incision (addressed with compounding cream at last MD visit) and also reports that there is a small region at the inferior aspect of her TKA incision site where the wound has not closed. She reports she has had some drainage from this area which has been clear to pink/red.    Pertinent History  history of femoral osteotomy bilaterally and currently with left knee arthrosis as well, diabetic    Limitations  Standing;Lifting;House hold activities;Sitting;Walking    Diagnostic tests   X-rays    Patient Stated Goals  resume PLOF as close as possible, return to Taekwondo participation    Currently in Pain?  Yes    Pain Score  5     Pain Location  Knee    Pain Orientation  Right    Pain Descriptors / Indicators  Burning;Aching    Pain Type  Surgical pain    Pain Onset  More than a month ago    Pain Frequency  Constant    Aggravating Factors   car travel, prolonged sitting, knee ROM    Pain Relieving Factors  movement    Effect of Pain on Daily Activities  limits positional tolerance as well as tolerance of ROM exercises         [facility] PT Assessment - 12/29/18 0001      Assessment   Medical Diagnosis  s/p right TKA    Referring Provider (PT)  Carroll SageHoward Homesley, MD    Onset Date/Surgical Date  11/11/18    Hand Dominance  Right    Prior Therapy  acute care and home health therapy s/p TKA      Precautions   Precaution Comments  No open chain exercises per MD      Restrictions   Weight Bearing Restrictions  No      Balance Screen   Has the patient fallen in the  past 6 months  No      [place] residence    Living Arrangements  Spouse/significant other    Type of [place] Access  --   1 set of 4 steps without rail then 2 steps with rail   Home Layout  Multi-level    Alternate Level Stairs-Number of Steps  --   3 story home, left rail for staircases in home   [place]  --   has SPC, RW, and riser for commode     Prior Function   Level of Independence  Independent with community mobility without device      Cognition   Overall Cognitive Status  Within Functional Limits for tasks assessed      Observation/Other Assessments   Observations  TKA incision appears well-healed but small area of open wound at inferior aspect of incision, no drainage or erythema noted    Focus on Therapeutic Outcomes (FOTO)   45% limited      Observation/Other Assessments-Edema    Edema  Circumferential      Circumferential  Edema   Circumferential - Right  45 cm    Circumferential - Left   42.5 cm      Sensation   Light Touch  --   numbness lateral to TKA incision site     ROM / Strength   AROM / PROM / Strength  AROM;PROM;Strength      AROM   AROM Assessment Site  Knee    Right/Left Knee  Right;Left    Right Knee Extension  -3    Right Knee Flexion  95    Left Knee Extension  126    Left Knee Flexion  0      PROM   PROM Assessment Site  Knee    Right/Left Knee  Right    Right Knee Extension  -2    Right Knee Flexion  100      Strength   Strength Assessment Site  Knee    Right/Left Knee  Right;Left    Right Knee Flexion  5/5    Right Knee Extension  4/5    Left Knee Flexion  5/5    Left Knee Extension  5/5      Bed Mobility   Bed Mobility  --   independent     Transfers   Transfers  --   independent for sit<>stand     Ambulation/Gait   Gait Comments  Pt. uses SPC to ambulate for community mobility and ambulates without AD at home, she demonstrates ability for mod I ambulation with SPC with mild antalgia and is also able to ambulate in clinic gym (today 100 feet) without AD at mod I level due to slower gait speed                Objective measurements completed on examination: See above findings.      [facility] PT Treatment/Exercise - 12/29/18 0001      Exercises   Exercises  Knee/Hip      Knee/Hip Exercises: Stretches   Other Knee/Hip Stretches  step stretch for knee flexion with right foot on 8 in. step 10 x 5-10 sec holds      Knee/Hip Exercises: Standing   Terminal Knee Extension  AROM;Strengthening;Right;1 set;10 reps    Theraband Level (Terminal Knee Extension)  Level 4 (Blue)    Forward Step Up  Right;10 reps;Hand Hold: 1;Step Height:  6"    Functional Squat  10 reps    Functional Squat Limitations  at counter             PT Education - 12/29/18 2006    Education Details  HEP, POC, ROM goals    Person(s) Educated  Patient    Methods   Explanation;Demonstration;Verbal cues;Handout    Comprehension  Verbalized understanding;Returned demonstration          PT Long Term Goals - 12/29/18 2013      PT LONG TERM GOAL #1   Title  Independent with community level ambulation without AD    Baseline  uses SPC    Time  6    Period  Weeks    Status  New    Target Date  02/09/19      PT LONG TERM GOAL #2   Title  Right quad strength 5/5 to improve ability to navigate stairs at home and perform transfers from low seating    Baseline  4/5    Time  6    Period  Weeks    Status  New    Target Date  02/09/19      PT LONG TERM GOAL #3   Title  Improve FOTO outcome measure score to 36% or less impairment    Baseline  45% limited    Time  6    Period  Weeks    Status  New    Target Date  02/09/19      PT LONG TERM GOAL #4   Title  Right knee flexion AROM to 120 deg to improve ability to navigate stairs, donn shoes and perform transfers from low seating    Baseline  95 deg    Time  6    Period  Weeks    Status  New    Target Date  02/09/19      PT LONG TERM GOAL #5   Title  Tolerate standing, ambulation for chores and activities such as grocery shopping with right knee pain <3/10    Baseline  5/10    Time  6    Period  Weeks    Status  New    Target Date  02/09/19             Plan - 12/29/18 2008    Clinical Impression Statement  Pt. presents s/p TKA with right knee pain with decreased ROM/stiffness and quad muscle weakness. Pt. would benefit from PT to help address current deficits to improve functional status with mobility. Slower progress expected given factor including prior surgical history and associated quad deficit. Regarding wound/open area at inferior aspect of incision site no signs of infection but will continue to monitor.    Personal Factors and Comorbidities  Comorbidity 2    Comorbidities  diabetes, surgical history    Examination-Activity Limitations   Transfers;Sit;Sleep;Hygiene/Grooming;Bathing;Stairs;Squat;Lift;Locomotion Level;Stand    Examination-Participation Restrictions  Cleaning;Yard Work;Shop;Driving;Community Activity    Stability/Clinical Decision Making  Stable/Uncomplicated    Clinical Decision Making  Low    Rehab Potential  Good    PT Frequency  --   2-3x/week   PT Duration  6 weeks    PT Treatment/Interventions  ADLs/Self Care Home Management;Cryotherapy;Electrical Stimulation;Moist Heat;Neuromuscular re-education;Therapeutic activities;Therapeutic exercise;Patient/family education;Manual techniques;Passive range of motion;Taping;Functional mobility training;Stair training;Balance training    PT Next Visit Plan  no open chain exercises per MD, focus knee ROM and closed chain quad strengthening progression-try adding side step ups and mini lunges, balance/proprioceptive  exercises, cryo prn    PT Home Exercise Plan  standing TKE with Theraband, squats at counter, step ups, step stretch for knee flexion ROM    Consulted and Agree with Plan of Care  Patient       Patient will benefit from skilled therapeutic intervention in order to improve the following deficits and impairments:  Impaired sensation, Difficulty walking, Decreased balance, Abnormal gait, Pain, Decreased strength, Decreased activity tolerance, Increased edema, Decreased range of motion  Visit Diagnosis: Right knee pain, unspecified chronicity  Stiffness of right knee, not elsewhere classified  Difficulty in walking, not elsewhere classified  Localized edema  Muscle weakness (generalized)     Problem List There are no active problems to display for this patient.  Lazarus Gowda, PT, DPT 12/29/18 8:23 PM  [facility] [address] [place], [place], [address] Phone: [phone]   Fax:  [phone]  Name: Megan [name] MRN: [id number] Date of Birth: 1960-05-28

## 2019-01-02 NOTE — Addendum Note (Signed)
Addended by: Beaulah Dinning S on: 01/02/2019 09:25 AM   Modules accepted: Orders

## 2019-01-03 ENCOUNTER — Ambulatory Visit: Payer: BLUE CROSS/BLUE SHIELD | Admitting: Physical Therapy

## 2019-01-04 ENCOUNTER — Ambulatory Visit: Payer: BLUE CROSS/BLUE SHIELD | Admitting: Physical Therapy

## 2019-01-04 ENCOUNTER — Encounter: Payer: Self-pay | Admitting: Physical Therapy

## 2019-01-04 ENCOUNTER — Other Ambulatory Visit: Payer: Self-pay

## 2019-01-04 ENCOUNTER — Encounter (INDEPENDENT_AMBULATORY_CARE_PROVIDER_SITE_OTHER): Payer: Self-pay | Admitting: Hospital

## 2019-01-04 DIAGNOSIS — M6281 Muscle weakness (generalized): Secondary | ICD-10-CM

## 2019-01-04 DIAGNOSIS — R600 Localized edema: Secondary | ICD-10-CM

## 2019-01-04 DIAGNOSIS — M25561 Pain in right knee: Secondary | ICD-10-CM

## 2019-01-04 DIAGNOSIS — M25661 Stiffness of right knee, not elsewhere classified: Secondary | ICD-10-CM

## 2019-01-04 DIAGNOSIS — R262 Difficulty in walking, not elsewhere classified: Secondary | ICD-10-CM

## 2019-01-04 NOTE — Therapy (Addendum)
[place], [place], [address] Phone: [phone]   Fax:  [phone]  Physical Therapy Treatment/Discharge  Patient Details  Name: Megan [name] MRN: [id number] Date of Birth: 1960-08-22 Referring Provider (PT): Ardyth Harps, MD   Encounter Date: 01/04/2019  PT End of Session - 01/04/19 1536    Visit Number  2    Number of Visits  18    Date for PT Re-Evaluation  02/09/19    Authorization Type  BCBS-21 remaining visits (30 visits with 9 previously used)    PT Start Time  0330    PT Stop Time  0418    PT Time Calculation (min)  48 min       Past Medical History:  Diagnosis Date  . Asthma   . Diabetes mellitus without complication ([place])   . Hypertension   . Thyroid disease     Past Surgical History:  Procedure Laterality Date  . ABDOMINAL HYSTERECTOMY    . APPENDECTOMY    . FEMORAL OSTEOTOMY W/ APPLICATION EXTERNAL FIXATOR Right   . ROTATOR CUFF REPAIR      There were no vitals filed for this visit.  Subjective Assessment - 01/04/19 1534    Subjective  I have not been able to do HEP, driving back and forth to school in [place]. Doing a lot of walking on campus.    Currently in Pain?  Yes    Pain Score  5     Pain Location  Knee    Pain Orientation  Right;Anterior;Lateral    Pain Descriptors / Indicators  Aching;Burning                       [facility] Adult PT Treatment/Exercise - 01/04/19 0001      Knee/Hip Exercises: Stretches   Other Knee/Hip Stretches  step stretch for knee flexion with right foot on 8 in. step 10 x 5-10 sec holds    Other Knee/Hip Stretches  slant board stretch      Knee/Hip Exercises: Aerobic   Nustep  L4 LE only  x 5 minutes       Knee/Hip Exercises: Standing   Terminal Knee Extension  AROM;Strengthening;Right;1 set;10 reps   3 way    Theraband Level (Terminal Knee Extension)  Level 4 (Blue)    Lateral Step Up  5 reps    Lateral Step Up  Limitations  pain to 4 inch step     Forward Step Up  2 sets;10 reps;Step Height: 4"    Functional Squat  20 reps   2 sets   Functional Squat Limitations  at sink deep squats, then sit-stand from standard chair -needs foam in chair       Modalities   Modalities  Vasopneumatic      Vasopneumatic   Number Minutes Vasopneumatic   10 minutes    Vasopnuematic Location   Knee    Vasopneumatic Pressure  Medium    Vasopneumatic Temperature   34                  PT Long Term Goals - 12/29/18 2013      PT LONG TERM GOAL #1   Title  Independent with community level ambulation without AD    Baseline  uses SPC    Time  6    Period  Weeks    Status  New    Target Date  02/09/19      PT LONG  TERM GOAL #2   Title  Right quad strength 5/5 to improve ability to navigate stairs at home and perform transfers from low seating    Baseline  4/5    Time  6    Period  Weeks    Status  New    Target Date  02/09/19      PT LONG TERM GOAL #3   Title  Improve FOTO outcome measure score to 36% or less impairment    Baseline  45% limited    Time  6    Period  Weeks    Status  New    Target Date  02/09/19      PT LONG TERM GOAL #4   Title  Right knee flexion AROM to 120 deg to improve ability to navigate stairs, donn shoes and perform transfers from low seating    Baseline  95 deg    Time  6    Period  Weeks    Status  New    Target Date  02/09/19      PT LONG TERM GOAL #5   Title  Tolerate standing, ambulation for chores and activities such as grocery shopping with right knee pain <3/10    Baseline  5/10    Time  6    Period  Weeks    Status  New    Target Date  02/09/19            Plan - 01/04/19 1615    Clinical Impression Statement  Pt arrives reporting min compliance with HEP. 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 She has pain in anterior knee with knee flexion stretching. We reviewed HEP and progressed with sit-stands. She required mod cues and elevated seat to  perform sit-stand without UE.Vaso used at end of session for pain and edema.    PT Next Visit Plan  no open chain exercises per MD, focus knee ROM and closed chain quad strengthening progression-try adding side step ups and mini lunges, balance/proprioceptive exercises, cryo prn    PT Home Exercise Plan  standing TKE with Theraband, squats at counter, step ups, step stretch for knee flexion ROM       Patient will benefit from skilled therapeutic intervention in order to improve the following deficits and impairments:  Impaired sensation, Difficulty walking, Decreased balance, Abnormal gait, Pain, Decreased strength, Decreased activity tolerance, Increased edema, Decreased range of motion  Visit Diagnosis: Right knee pain, unspecified chronicity  Stiffness of right knee, not elsewhere classified  Difficulty in walking, not elsewhere classified  Localized edema  Muscle weakness (generalized)     Problem List There are no active problems to display for this patient.   Dorene Ar, [place] 01/04/2019, 4:20 PM  [facility] [address] [place], [place], [address] Phone: [phone]   Fax:  [phone]  Name: Megan [name] MRN: [id number] Date of Birth: 10-Mar-1961  PHYSICAL THERAPY DISCHARGE SUMMARY  Visits from Start of Care: 2  Current functional level related to goals / functional outcomes: Pt reports she is making good progress with her Rt knee. Going to have the left replaced.     Remaining deficits: Unknown, pt not able to participate in therapy at this time, requests discharge   Education / Equipment: HEP  Plan: Patient agrees to discharge.  Patient goals were not met. Patient is being discharged due to the patient's request.  ?????    Jeral Pinch, PT 01/23/19 11:58 AM

## 2019-01-05 ENCOUNTER — Ambulatory Visit: Payer: BLUE CROSS/BLUE SHIELD | Admitting: Physical Therapy

## 2019-01-09 ENCOUNTER — Encounter: Payer: Self-pay | Admitting: Internal Medicine

## 2019-01-09 ENCOUNTER — Encounter: Payer: Self-pay | Admitting: Hospital

## 2019-01-09 DIAGNOSIS — Z1239 Encounter for other screening for malignant neoplasm of breast: Secondary | ICD-10-CM

## 2019-01-10 ENCOUNTER — Ambulatory Visit: Payer: BLUE CROSS/BLUE SHIELD | Admitting: Physical Therapy

## 2019-01-16 ENCOUNTER — Telehealth: Payer: Self-pay | Admitting: Physical Therapy

## 2019-01-16 ENCOUNTER — Ambulatory Visit: Payer: BLUE CROSS/BLUE SHIELD | Admitting: Physical Therapy

## 2019-01-16 NOTE — Telephone Encounter (Signed)
Left message for pt regarding missing todays PT appointment, reminded her of our attendance policy and of her next appointment.

## 2019-01-18 ENCOUNTER — Ambulatory Visit: Payer: BLUE CROSS/BLUE SHIELD | Admitting: Physical Therapy

## 2019-01-23 ENCOUNTER — Ambulatory Visit: Payer: BLUE CROSS/BLUE SHIELD | Admitting: Physical Therapy

## 2019-01-23 ENCOUNTER — Telehealth: Payer: Self-pay | Admitting: Physical Therapy

## 2019-01-23 NOTE — Telephone Encounter (Signed)
Spoke with patient, she is sorry , thought she had canceled appointment. She requested to be discharged at this time.  She is doing her HEP and is going to have the left knee operated on.  Between school and her left knee she has been very limited.

## 2019-01-25 ENCOUNTER — Ambulatory Visit: Payer: BLUE CROSS/BLUE SHIELD | Admitting: Physical Therapy

## 2019-01-30 ENCOUNTER — Encounter: Payer: BLUE CROSS/BLUE SHIELD | Admitting: Physical Therapy

## 2019-02-01 ENCOUNTER — Ambulatory Visit: Payer: BLUE CROSS/BLUE SHIELD | Admitting: Physical Therapy

## 2019-02-01 DIAGNOSIS — M21069 Valgus deformity, not elsewhere classified, unspecified knee: Secondary | ICD-10-CM | POA: Insufficient documentation

## 2019-02-06 ENCOUNTER — Encounter: Payer: BLUE CROSS/BLUE SHIELD | Admitting: Physical Therapy

## 2019-02-08 ENCOUNTER — Encounter: Payer: BLUE CROSS/BLUE SHIELD | Admitting: Physical Therapy

## 2019-02-13 ENCOUNTER — Encounter: Payer: BLUE CROSS/BLUE SHIELD | Admitting: Physical Therapy

## 2019-02-15 ENCOUNTER — Encounter: Payer: BLUE CROSS/BLUE SHIELD | Admitting: Physical Therapy

## 2019-02-21 ENCOUNTER — Encounter (INDEPENDENT_AMBULATORY_CARE_PROVIDER_SITE_OTHER): Payer: Self-pay | Admitting: Hospital

## 2019-02-22 ENCOUNTER — Ambulatory Visit
Admission: RE | Admit: 2019-02-22 | Discharge: 2019-02-22 | Disposition: A | Discharge status: Home / Self Care | Payer: BLUE CROSS/BLUE SHIELD | Source: Ambulatory Visit | Attending: Urology | Admitting: Urology

## 2019-02-22 ENCOUNTER — Other Ambulatory Visit: Payer: Self-pay

## 2019-02-22 ENCOUNTER — Ambulatory Visit: Payer: BLUE CROSS/BLUE SHIELD | Admitting: Urology

## 2019-02-22 ENCOUNTER — Ambulatory Visit
Admission: EM | Admit: 2019-02-22 | Discharge: 2019-02-22 | Disposition: A | Discharge status: Home / Self Care | Payer: BLUE CROSS/BLUE SHIELD | Source: Ambulatory Visit | Attending: Urology | Admitting: Urology

## 2019-02-22 ENCOUNTER — Encounter: Payer: Self-pay | Admitting: Urology

## 2019-02-22 VITALS — BP 171/126 | HR 114 | Ht 64.0 in | Wt 193.0 lb

## 2019-02-22 DIAGNOSIS — R82992 Hyperoxaluria: Secondary | ICD-10-CM

## 2019-02-22 DIAGNOSIS — N200 Calculus of kidney: Secondary | ICD-10-CM | POA: Diagnosis not present

## 2019-02-22 DIAGNOSIS — Q615 Medullary cystic kidney: Secondary | ICD-10-CM | POA: Diagnosis not present

## 2019-02-22 DIAGNOSIS — R3915 Urgency of urination: Secondary | ICD-10-CM

## 2019-02-22 LAB — URINALYSIS, COMPLETE
Bilirubin, UA: NEGATIVE
Glucose, UA: NEGATIVE
Ketones, UA: NEGATIVE
Leukocytes,UA: NEGATIVE
Nitrite, UA: NEGATIVE
Protein,UA: NEGATIVE
Specific Gravity, UA: 1.025 (ref 1.005–1.030)
Urobilinogen, Ur: 0.2 mg/dL (ref 0.2–1.0)
pH, UA: 5.5 (ref 5.0–7.5)

## 2019-02-22 LAB — MICROSCOPIC EXAMINATION

## 2019-02-22 NOTE — Progress Notes (Signed)
02/22/2019 9:55 AM   Megan [name] [birth year] [id number]  Referring provider: Mick Sell, MD [address] [place],  [place] [address]  Chief Complaint  Patient presents with  . Nephrolithiasis    HPI: 57 year old female with a personal history of recurrent nephrolithiasis and medullary sponge kidney who presents today to establish care.  She recently moved to the area from [place].  She was available to [facility] and more recently [facility] by Dr. Abigail Butts.  These extensive notes are available to [place] today via care everywhere and were extensively reviewed in anticipation of her visit.  Her last surgery was 05/2017 for bilateral ureteroscopy.  She has a history of 10+ procedures both electively and for treatment of septic obstructing stones.  There is some concern about development of bilateral ureteral stricture given the numerous procedures.  She is had ESWL in the very remote past but none in the recent past.  She believes she is passing stones with increasing frequency.  She has been struggling with knee replacement surgery, gaining weight and has reintroduced oxalate in her diet which she previously had extensive counseling and was maintaining L low oxalate diet.  She has been passing stones approximately 1 every other month.  She recently passed a small stone.  She is not having flank pain currently today.  She had numerous 24 urine metabolic evaluations.  Her primary metabolic abnormalities are hyperoxaluria, low urinary volume, mild hypercalciuria and mild excess of daily sodium, mild hypocitraturia and hyperuricosuria.    Her hydrochlorothiazide was recently increased to 50 mg and she was counseled to be very strict in her oxalate consumption.  She tries to drink plenty water daily.  Per Dr. Starling Manns last note, she was also started on Urocit-K twice daily however does not appear that she is taking this medication currently nor does she ever recall taking this.   No current flank pain today.  She does report that in terms of urination, when she has to go, she has to go and cannot wait much.  She denies any incontinence.  She gets up few times at night to void.  Overall, she is uninterested in [place] therapy for this.  PMH: Past Medical History:  Diagnosis Date  . Asthma   . Diabetes mellitus without complication (HCC)   . Hypertension   . Thyroid disease     Surgical History: Past Surgical History:  Procedure Laterality Date  . ABDOMINAL HYSTERECTOMY    . APPENDECTOMY    . FEMORAL OSTEOTOMY W/ APPLICATION EXTERNAL FIXATOR Right   . ROTATOR CUFF REPAIR      Home Medications:  Allergies as of 02/22/2019      Reactions   Albolene Rash   Latex Other (See Comments), Rash   Blisters  Blisters Blisters    Morphine Anaphylaxis, Other (See Comments), Nausea Only, Nausea And Vomiting, Shortness Of Breath, Swelling   Ha's and shortness of breath Vomiting and facial swelling   Nickel Rash, Swelling   Swelling and rash Swelling and rash   Petrolatum Other (See Comments), Rash   Blister All petroleum products per patient, facial swelling Blister   Tape Rash   Erythromycin Nausea Only, Nausea And Vomiting   Nausea/vomiting   Erythromycin Ethylsuccinate Nausea And Vomiting   Hydromorphone Hcl Other (See Comments)   Other Other (See Comments)   Generic medications per patient- redness and burning in gums Generic medications per patient- redness and burning in gums   Oxycodone-aspirin Other (See Comments), Nausea  And Vomiting   Other reaction(s): Other (critical) Headaches and nightmares Headaches and nightmares Nightmares   Sulfa Antibiotics Rash   Rash  Rash       Medication List       Accurate as of February 22, 2019 11:59 PM. If you have any questions, ask your nurse or doctor.        STOP taking these medications   gabapentin 300 MG capsule Commonly known as: NEURONTIN Stopped by: Vanna ScotlandAshley Lavone Weisel, MD   meloxicam 15 MG  tablet Commonly known as: MOBIC Stopped by: Vanna ScotlandAshley Andie Mortimer, MD     TAKE these medications   albuterol 108 (90 Base) MCG/ACT inhaler Commonly known as: VENTOLIN HFA Inhale 1-2 puffs into the lungs every 6 (six) hours as needed for wheezing or shortness of breath. What changed: Another medication with the same name was removed. Continue taking this medication, and follow the directions you see here. Changed by: Vanna ScotlandAshley Robertlee Rogacki, MD   amLODipine 10 MG tablet Commonly known as: NORVASC Take by mouth.   Aspirin Low Dose 81 MG EC tablet Generic drug: aspirin   carbamazepine 200 MG tablet Commonly known as: TEGRETOL Take 200 mg by mouth 2 (two) times daily.   celecoxib 200 MG capsule Commonly known as: CELEBREX Take by mouth.   hydrochlorothiazide 25 MG tablet Commonly known as: HYDRODIURIL Take by mouth.   levothyroxine 50 MCG tablet Commonly known as: SYNTHROID Take 50 mcg by mouth daily before breakfast.   lisinopril 10 MG tablet Commonly known as: ZESTRIL TK 1 T PO QD   Loteprednol Etabonate 0.5 % Gel Apply to eye.   metFORMIN 1000 MG tablet Commonly known as: GLUCOPHAGE Take 1,000 mg by mouth 2 (two) times daily with a meal.   montelukast 10 MG tablet Commonly known as: SINGULAIR TK 1 T PO QD   Vivelle-Dot 0.05 MG/24HR patch Generic drug: estradiol Place onto the skin.       Allergies:  Allergies  Allergen Reactions  . Albolene Rash  . Latex Other (See Comments) and Rash    Blisters  Blisters Blisters    . Morphine Anaphylaxis, Other (See Comments), Nausea Only, Nausea And Vomiting, Shortness Of Breath and Swelling    Ha's and shortness of breath Vomiting and facial swelling   . Nickel Rash and Swelling    Swelling and rash Swelling and rash   . Petrolatum Other (See Comments) and Rash    Blister All petroleum products per patient, facial swelling Blister   . Tape Rash  . Erythromycin Nausea Only and Nausea And Vomiting    Nausea/vomiting    . Erythromycin Ethylsuccinate Nausea And Vomiting  . Hydromorphone Hcl Other (See Comments)  . Other Other (See Comments)    Generic medications per patient- redness and burning in gums Generic medications per patient- redness and burning in gums   . Oxycodone-Aspirin Other (See Comments) and Nausea And Vomiting    Other reaction(s): Other (critical) Headaches and nightmares Headaches and nightmares Nightmares   . Sulfa Antibiotics Rash    Rash  Rash      Family History: No family history on file.  Social History:  reports that she has been smoking. She has never used smokeless tobacco. She reports current alcohol use. She reports that she does not use drugs.  ROS: UROLOGY Frequent Urination?: No Hard to postpone urination?: Yes Burning/pain with urination?: No Get up at night to urinate?: No Leakage of urine?: No Urine stream starts and stops?: No Trouble starting stream?: No  Do you have to strain to urinate?: No Blood in urine?: No Urinary tract infection?: Yes Sexually transmitted disease?: No Injury to kidneys or bladder?: No Painful intercourse?: No Weak stream?: No Currently pregnant?: No Vaginal bleeding?: No Last menstrual period?: n  Gastrointestinal Nausea?: No Vomiting?: No Indigestion/heartburn?: No Diarrhea?: No Constipation?: No  Constitutional Fever: No Night sweats?: No Weight loss?: No Fatigue?: No  Skin Skin rash/lesions?: No Itching?: No  Eyes Blurred vision?: No Double vision?: No  Ears/Nose/Throat Sore throat?: No Sinus problems?: No  Hematologic/Lymphatic Swollen glands?: No Easy bruising?: No  Cardiovascular Leg swelling?: No Chest pain?: No  Respiratory Cough?: No Shortness of breath?: No  Endocrine Excessive thirst?: No  Musculoskeletal Back pain?: No Joint pain?: No  Neurological Headaches?: No Dizziness?: No  Psychologic Depression?: No Anxiety?: No  Physical Exam: BP (!) 171/126   Pulse (!)  114   Ht  (1.626 m)   Wt 193 lb (87.5 kg)   BMI 33.13 kg/m   Constitutional:  Alert and oriented, No acute distress. HEENT: [place] AT, moist mucus membranes.  Trachea midline, no masses. Cardiovascular: No clubbing, cyanosis, or edema. Respiratory: Normal respiratory effort, no increased work of breathing. GI: Abdomen is soft, nontender, nondistended, no abdominal masses, obese Skin: No rashes, bruises or suspicious lesions. Neurologic: Grossly intact, no focal deficits, moving all 4 extremities. Psychiatric: Normal mood and affect.  Laboratory Data: Lab Results  Component Value Date   WBC 8.1 07/19/2018   HGB 15.1 (H) 07/19/2018   HCT 45.1 07/19/2018   MCV 98.9 07/19/2018   PLT 203 07/19/2018    Lab Results  Component Value Date   CREATININE 0.73 07/19/2018    Urinalysis    Component Value Date/Time   APPEARANCEUR Cloudy (A) 02/22/2019 1047   GLUCOSEU Negative 02/22/2019 1047   BILIRUBINUR Negative 02/22/2019 1047   PROTEINUR Negative 02/22/2019 1047   NITRITE Negative 02/22/2019 1047   LEUKOCYTESUR Negative 02/22/2019 1047    Lab Results  Component Value Date   LABMICR See below: 02/22/2019   WBCUA 0-5 02/22/2019   LABEPIT 0-10 02/22/2019   BACTERIA Many (A) 02/22/2019    Pertinent Imaging: KUB ordered today pending  Assessment & Plan:    1. Medullary sponge kidney Frequent stone events requiring multiple surgeries KUB today for baseline, will try to avoid CT scans as she has had numerous in the recent past With her next acute stone episode, may consider repeat cross-sectional image, she will call and let us know if and when that occurs - Abdomen 1 view (KUB); Future  2. Recurrent nephrolithiasis Multiple previous metabolic evaluations  Given her increased frequency of stone episodes, strongly encouraged her to increase her fluid intake and go back on her extremely low oxalate diet.  Offered referral to nutritionist, declined she is already familiar  with this and has seen a nutritionist in the past.  Continue hydrochlorothiazide at current dose  We will consider addition of potassium citrate given that in the past, it appears that this could be helpful and was previously recommended.  Will hold off for the time being. - Urinalysis, Complete  3. Urinary urgency No evidence of UTI is contributing factor  Not interested in pharmacotherapy, may be related to increased fluid intake.  We will continue to follow.  4. Hyperoxaluria As above, primary deficit.    Return in about 6 months (around 08/22/2019) for KUB (and KUB today).  Vanna Scotland, MD  [facility] [address] [place], [place]  [address] [phone]  I spent 45 min with this patient of which greater than 50% was spent in counseling and coordination of care with the patient.

## 2019-02-23 ENCOUNTER — Telehealth: Payer: Self-pay | Admitting: *Deleted

## 2019-02-23 NOTE — Telephone Encounter (Addendum)
Left VM to return call-no DPR on file.  ----- Message from Hollice Espy, MD sent at 02/23/2019  9:57 AM EST ----- From the x-ray, it looks like you have 3 tiny stones of the right and 2 on the left.  Some stones may be hidden and not visible on x-ray.  Overall, all of these are quite small and passable.  Would not recommend any surgical intervention at this time.  See back in 6 months or sooner as needed.

## 2019-02-24 ENCOUNTER — Encounter: Payer: Self-pay | Admitting: Urology

## 2019-02-27 NOTE — Telephone Encounter (Signed)
Pt. Returned call.gave her results and patient expressed understanding and will follow up in 6 months or sooner if needed.

## 2019-03-30 ENCOUNTER — Encounter (INDEPENDENT_AMBULATORY_CARE_PROVIDER_SITE_OTHER): Payer: Self-pay | Admitting: Hospital

## 2019-04-29 ENCOUNTER — Encounter: Payer: Self-pay | Admitting: Hospital

## 2019-05-04 ENCOUNTER — Encounter: Payer: Self-pay | Admitting: Hospital

## 2019-06-23 ENCOUNTER — Ambulatory Visit
Admission: RE | Admit: 2019-06-23 | Discharge: 2019-06-23 | Disposition: A | Discharge status: Home / Self Care | Payer: BC Managed Care – PPO | Source: Ambulatory Visit | Attending: Physician Assistant | Admitting: Physician Assistant

## 2019-06-23 ENCOUNTER — Other Ambulatory Visit: Payer: Self-pay

## 2019-06-23 ENCOUNTER — Encounter: Payer: Self-pay | Admitting: Physician Assistant

## 2019-06-23 ENCOUNTER — Other Ambulatory Visit: Payer: Self-pay | Admitting: Family Medicine

## 2019-06-23 ENCOUNTER — Ambulatory Visit
Admission: RE | Admit: 2019-06-23 | Discharge: 2019-06-23 | Disposition: A | Discharge status: Home / Self Care | Payer: BC Managed Care – PPO | Attending: Physician Assistant | Admitting: Physician Assistant

## 2019-06-23 ENCOUNTER — Telehealth: Payer: Self-pay

## 2019-06-23 ENCOUNTER — Ambulatory Visit: Payer: BC Managed Care – PPO | Admitting: Physician Assistant

## 2019-06-23 VITALS — BP 168/107 | HR 89 | Ht 64.0 in | Wt 195.0 lb

## 2019-06-23 DIAGNOSIS — Z87442 Personal history of urinary calculi: Secondary | ICD-10-CM

## 2019-06-23 DIAGNOSIS — N200 Calculus of kidney: Secondary | ICD-10-CM

## 2019-06-23 DIAGNOSIS — R300 Dysuria: Secondary | ICD-10-CM | POA: Diagnosis not present

## 2019-06-23 DIAGNOSIS — N3001 Acute cystitis with hematuria: Secondary | ICD-10-CM | POA: Diagnosis not present

## 2019-06-23 LAB — URINALYSIS, COMPLETE
Bilirubin, UA: NEGATIVE
Nitrite, UA: POSITIVE — AB
Specific Gravity, UA: 1.025 (ref 1.005–1.030)
Urobilinogen, Ur: 4 mg/dL — ABNORMAL HIGH (ref 0.2–1.0)
pH, UA: 5.5 (ref 5.0–7.5)

## 2019-06-23 LAB — MICROSCOPIC EXAMINATION: Epithelial Cells (non renal): >10 /hpf — AB (ref 0–10)

## 2019-06-23 MED ORDER — CEFTRIAXONE SODIUM 1 G IJ SOLR
1.0000 g | Freq: Once | INTRAMUSCULAR | Status: AC
  Administered 2019-06-23: 1 g via INTRAMUSCULAR

## 2019-06-23 MED ORDER — CEPHALEXIN 500 MG PO CAPS: 500.0000 mg | ORAL_CAPSULE | Freq: Two times a day (BID) | ORAL | 0 refills | Status: AC

## 2019-06-23 NOTE — Progress Notes (Signed)
06/23/2019 4:16 PM   [name] 1960/11/15 [id number]  CC: Dysuria, low back pain, urgency, urinary incontinence  HPI: Megan [name] is a 59 y.o. female with PMH recurrent nephrolithiasis and medullary sponge kidney who presents today for evaluation of possible UTI versus acute stone episode. She was first seen by Dr. Apolinar Junes on 02/22/2019 to establish care in our clinic following a recent move from [place].  She has an extensive history of nephrolithiasis requiring >10 urologic procedures for stone management.  She does have a history of septic obstructing stones.  She has undergone frequent cross-sectional imaging in the recent past.  Today, she reports an approximate 6-day history of dysuria, bilateral back pain, urinary urgency, urge incontinence, nausea, chills, and right-sided suprapubic pain.  She denies fever, vomiting, and gross hematuria.  She reports a history of multiple episodes of pyelonephritis.  She has taken Pyridium at home for management of her symptoms.  In-office UA today positive for 2+ protein, nitrites, and 3+ leukocyte esterase; urine microscopy with 11-30 WBCs/HPF, 3-10 RBCs/HPF, >10 epithelial cells, and moderate bacteria.   PMH: Past Medical History:  Diagnosis Date  . Asthma   . Diabetes mellitus without complication (HCC)   . Hypertension   . Thyroid disease     Surgical History: Past Surgical History:  Procedure Laterality Date  . ABDOMINAL HYSTERECTOMY    . APPENDECTOMY    . FEMORAL OSTEOTOMY W/ APPLICATION EXTERNAL FIXATOR Right   . ROTATOR CUFF REPAIR      Home Medications:  Allergies as of 06/23/2019      Reactions   Albolene Rash   Latex Other (See Comments), Rash   Blisters  Blisters Blisters    Morphine Anaphylaxis, Other (See Comments), Nausea Only, Nausea And Vomiting, Shortness Of Breath, Swelling   Ha's and shortness of breath Vomiting and facial swelling   Nickel Rash, Swelling   Swelling and rash Swelling and rash   Petrolatum Other (See Comments), Rash   Blister All petroleum products per patient, facial swelling Blister   Tape Rash   Erythromycin Nausea Only, Nausea And Vomiting   Nausea/vomiting   Erythromycin Ethylsuccinate Nausea And Vomiting   Hydromorphone Hcl Other (See Comments)   Other Other (See Comments)   Generic medications per patient- redness and burning in gums Generic medications per patient- redness and burning in gums   Oxycodone-aspirin Other (See Comments), Nausea And Vomiting   Other reaction(s): Other (critical) Headaches and nightmares Headaches and nightmares Nightmares   Sulfa Antibiotics Rash   Rash  Rash       Medication List       Accurate as of June 23, 2019  4:16 PM. If you have any questions, ask your nurse or doctor.        albuterol 108 (90 Base) MCG/ACT inhaler Commonly known as: VENTOLIN HFA Inhale 1-2 puffs into the lungs every 6 (six) hours as needed for wheezing or shortness of breath.   amLODipine 10 MG tablet Commonly known as: NORVASC Take by mouth.   Aspirin Low Dose 81 MG EC tablet Generic drug: aspirin   carbamazepine 200 MG tablet Commonly known as: TEGRETOL Take 200 mg by mouth 2 (two) times daily.   celecoxib 200 MG capsule Commonly known as: CELEBREX Take by mouth.   fluconazole 150 MG tablet Commonly known as: DIFLUCAN Take by mouth.   hydrochlorothiazide 25 MG tablet Commonly known as: HYDRODIURIL Take by mouth.   levothyroxine 50 MCG tablet Commonly known as: SYNTHROID Take 50 mcg by mouth  daily before breakfast.   lisinopril 10 MG tablet Commonly known as: ZESTRIL TK 1 T PO QD   Loteprednol Etabonate 0.5 % Gel Apply to eye.   metFORMIN 1000 MG tablet Commonly known as: GLUCOPHAGE Take 1,000 mg by mouth 2 (two) times daily with a meal.   montelukast 10 MG tablet Commonly known as: SINGULAIR TK 1 T PO QD   nitrofurantoin (macrocrystal-monohydrate) 100 MG capsule Commonly known as: MACROBID Take by  mouth.   phenazopyridine 100 MG tablet Commonly known as: PYRIDIUM Take by mouth.   Vivelle-Dot 0.05 MG/24HR patch Generic drug: estradiol Place onto the skin.       Allergies:  Allergies  Allergen Reactions  . Albolene Rash  . Latex Other (See Comments) and Rash    Blisters  Blisters Blisters    . Morphine Anaphylaxis, Other (See Comments), Nausea Only, Nausea And Vomiting, Shortness Of Breath and Swelling    Ha's and shortness of breath Vomiting and facial swelling   . Nickel Rash and Swelling    Swelling and rash Swelling and rash   . Petrolatum Other (See Comments) and Rash    Blister All petroleum products per patient, facial swelling Blister   . Tape Rash  . Erythromycin Nausea Only and Nausea And Vomiting    Nausea/vomiting   . Erythromycin Ethylsuccinate Nausea And Vomiting  . Hydromorphone Hcl Other (See Comments)  . Other Other (See Comments)    Generic medications per patient- redness and burning in gums Generic medications per patient- redness and burning in gums   . Oxycodone-Aspirin Other (See Comments) and Nausea And Vomiting    Other reaction(s): Other (critical) Headaches and nightmares Headaches and nightmares Nightmares   . Sulfa Antibiotics Rash    Rash  Rash      Family History: History reviewed. No pertinent family history.  Social History:   reports that she has been smoking. She has never used smokeless tobacco. She reports current alcohol use. She reports that she does not use drugs.  Physical Exam: BP (!) 168/107   Pulse 89   Ht 5\' 4"  (1.626 m)   Wt 195 lb (88.5 kg)   BMI 33.47 kg/m   Constitutional:  Alert and oriented, no acute distress, nontoxic appearing HEENT: [place], AT Cardiovascular: No clubbing, cyanosis, or edema Respiratory: Normal respiratory effort, no increased work of breathing GU: Bilateral CVA tenderness Skin: No rashes, bruises or suspicious lesions Neurologic: Grossly intact, no focal deficits, moving  all 4 extremities Psychiatric: Normal mood and affect  Laboratory Data: Results for orders placed or performed in visit on 06/23/19  Microscopic Examination   URINE  Result Value Ref Range   WBC, UA 11-30 (A) 0 - 5 /hpf   RBC 3-10 (A) 0 - 2 /hpf   Epithelial Cells (non renal) >10 (A) 0 - 10 /hpf   Renal Epithel, UA 0-10 (A) None seen /hpf   Bacteria, UA Moderate (A) None seen/Few  Urinalysis, Complete  Result Value Ref Range   Specific Gravity, UA 1.025 1.005 - 1.030   pH, UA 5.5 5.0 - 7.5   Color, UA Orange Yellow   Appearance Ur Cloudy (A) Clear   Leukocytes,UA 3+ (A) Negative   Protein,UA 2+ (A) Negative/Trace   Glucose, UA Trace (A) Negative   Ketones, UA Trace (A) Negative   RBC, UA Trace (A) Negative   Bilirubin, UA Negative Negative   Urobilinogen, Ur 4.0 (H) 0.2 - 1.0 mg/dL   Nitrite, UA Positive (A) Negative  Microscopic Examination See below:    Pertinent Imaging: KUB, 06/24/2019: CLINICAL DATA:  Bilateral kidney stones. Pt reports bilateral flank pain, UTI symptoms.  EXAM: ABDOMEN - 1 VIEW  COMPARISON:  Abdominal radiograph 02/22/2019  FINDINGS: Redemonstrated small bilateral renal calculi, similar burden to the prior radiograph with at least 3 stones on the right and 1 stone on the left. Radiopaque densities in the pelvis likely represent vascular phleboliths. Nonobstructive bowel gas pattern. No acute finding in the visualized skeleton. Lung bases are clear.  IMPRESSION: Redemonstrated bilateral renal calculi, similar burden to the prior radiograph.   Electronically Signed   By: Audie Pinto M.D.   On: 06/24/2019 17:17  I personally reviewed the images referenced above and note the presence of stable bilateral nephrolithiasis.  Assessment & Plan:   1. Acute cystitis with hematuria 59 year old female with PMH medullary sponge kidney and recurrent nephrolithiasis presents with a 6-day history of dysuria, urgency, bilateral back pain,  urge incontinence, suprapubic pain, nausea, and chills.  UA contaminated today, however notable for pyuria, microscopic hematuria, and moderate bacteria.  She is nitrate positive but has taken Azo.  UA consistent with acute cystitis with hematuria.  Based on her history of pyelonephritis and bilateral CVA tenderness on exam today, I administered 1 g ceftriaxone IM in clinic.  Prescribing empiric Keflex 500 mg twice daily x7 days.  Sending urine for culture to confirm.  I counseled the patient that I would contact her if her urine culture results indicate a necessary change in therapy.  She expressed understanding.  Additionally, patient will need to repeat UA in approximately 1 week to prove resolution of MH. - Urinalysis, Complete - CULTURE, URINE COMPREHENSIVE - cephALEXin (KEFLEX) 500 MG capsule; Take 1 capsule (500 mg total) by mouth 2 (two) times daily for 7 days.  Dispense: 14 capsule; Refill: 0 - Microscopic Examination - cefTRIAXone (ROCEPHIN) injection 1 g  2. History of nephrolithiasis Stable bilateral nephrolithiasis per KUB today.  Will defer cross-sectional imaging at this time, as I believe her UA and reported symptoms are more consistent with acute cystitis versus early pyelonephritis.  Counseled the patient to proceed to the emergency room if she develops acute worsening and flank pain, nausea, vomiting, fever, chills or if her symptoms fail to improve after 48 hours of antibiotics.  Should any of these occur, I recommend proceeding with CT stone study for further evaluation.  She expressed understanding. -DG Abd 1 View  Return in about 1 week (around 06/30/2019) for Lab visit for UA.  Debroah Loop, PA-C  [facility] [address], [place], [place] [address] [phone]

## 2019-06-23 NOTE — Telephone Encounter (Signed)
Patient called stating she is having urinary frequency and urgency with intermittent shooting pain in lower right pelvic area. She believes she has another UTI and maybe passing a stone. She was added on to PA schedule today for further evaluation

## 2019-06-26 ENCOUNTER — Other Ambulatory Visit: Payer: Self-pay | Admitting: Physician Assistant

## 2019-06-26 ENCOUNTER — Other Ambulatory Visit: Payer: Self-pay | Admitting: Radiology

## 2019-06-26 ENCOUNTER — Other Ambulatory Visit: Payer: Self-pay

## 2019-06-26 ENCOUNTER — Telehealth: Payer: Self-pay | Admitting: Physician Assistant

## 2019-06-26 ENCOUNTER — Ambulatory Visit
Admission: RE | Admit: 2019-06-26 | Discharge: 2019-06-26 | Disposition: A | Discharge status: Home / Self Care | Payer: BC Managed Care – PPO | Source: Ambulatory Visit | Attending: Physician Assistant | Admitting: Physician Assistant

## 2019-06-26 DIAGNOSIS — R109 Unspecified abdominal pain: Secondary | ICD-10-CM | POA: Insufficient documentation

## 2019-06-26 DIAGNOSIS — N200 Calculus of kidney: Secondary | ICD-10-CM

## 2019-06-26 LAB — CULTURE, URINE COMPREHENSIVE

## 2019-06-26 MED ORDER — TAMSULOSIN HCL 0.4 MG PO CAPS: 0.4000 mg | ORAL_CAPSULE | Freq: Every day | ORAL | 0 refills | Status: AC

## 2019-06-26 MED ORDER — ONDANSETRON HCL 4 MG PO TABS: 4.0000 mg | ORAL_TABLET | Freq: Three times a day (TID) | ORAL | 0 refills | Status: AC | PRN

## 2019-06-26 MED ORDER — TRAMADOL HCL 50 MG PO TABS: 50.0000 mg | ORAL_TABLET | Freq: Four times a day (QID) | ORAL | 0 refills | Status: DC | PRN

## 2019-06-26 NOTE — Telephone Encounter (Signed)
I just spoke with the patient via telephone to report the results of her CT scan.  I explained that she has a 3 mm distal right ureteral stone that is not obstructing.  Additionally, her urine culture has returned and she is on the right antibiotics for management of her urinary tract infection.  Based on these findings, I recommend continuing antibiotics and proceeding with a trial of passage versus definitive stone management.  Patient would prefer to proceed with definitive stone management at this time, as she states she continues to be quite uncomfortable with this acute stone episode.  She would prefer to undergo ureteroscopy with laser lithotripsy and stent placement assuming that we could clear her right sided renal stones during the procedure as well.  I explained that I needed to discuss her case with Dr. Apolinar Junes prior to making a formal plan.  She expressed understanding.  I will speak with Dr. Apolinar Junes overnight and plan to follow-up with the patient via telephone tomorrow.

## 2019-06-26 NOTE — Telephone Encounter (Signed)
is not feeling any better, pt feels that after her injection on Friday she should be feeling better. also asking for her culture results. Please advise. Thanks.

## 2019-06-26 NOTE — Telephone Encounter (Signed)
Called patient to discuss results of CT scan; LMOM to return my call.

## 2019-06-26 NOTE — Telephone Encounter (Signed)
Upon consulting with Dr. Apolinar Junes, we will proceed with right ureteroscopy with laser lithotripsy and stent placement for management of her 3 mm right UVJ stone as well as nonobstructing right renal stone burden.  I spoke with the patient via telephone to inform her of the above.  I reminded her that there is an approximate 10% treatment failure rate requiring a staged approach to stone management.  She expressed understanding.  She reports significant pain with her current stone episode and requests pain medicine at this time.  Additionally, she is reporting some nausea.  I have prescribed tramadol, Zofran, and tamsulosin for management of her acute stone episode in advance of scheduled surgery in 3 days and counseled her to continue prescribed Keflex.  Additionally, I counseled her to avoid NSAIDs after today in advance of her procedure.  She does not have a history of cardiac problems other than hypertension.  She does have a history of asthma, no pulmonary clearance needed.  Booking sheet completed today.

## 2019-06-27 ENCOUNTER — Other Ambulatory Visit: Payer: Self-pay | Admitting: Radiology

## 2019-06-27 ENCOUNTER — Other Ambulatory Visit
Admission: RE | Admit: 2019-06-27 | Discharge: 2019-06-27 | Disposition: A | Discharge status: Home / Self Care | Payer: BC Managed Care – PPO | Source: Ambulatory Visit | Attending: Urology | Admitting: Urology

## 2019-06-27 ENCOUNTER — Ambulatory Visit: Payer: BC Managed Care – PPO

## 2019-06-27 DIAGNOSIS — Z01812 Encounter for preprocedural laboratory examination: Secondary | ICD-10-CM | POA: Diagnosis not present

## 2019-06-27 DIAGNOSIS — Z20822 Contact with and (suspected) exposure to covid-19: Secondary | ICD-10-CM | POA: Diagnosis not present

## 2019-06-27 LAB — CBC
HCT: 42.6 % (ref 36.0–46.0)
Hemoglobin: 14.1 g/dL (ref 12.0–15.0)
MCH: 32.2 pg (ref 26.0–34.0)
MCHC: 33.1 g/dL (ref 30.0–36.0)
MCV: 97.3 fL (ref 80.0–100.0)
Platelets: 304 10*3/uL (ref 150–400)
RBC: 4.38 MIL/uL (ref 3.87–5.11)
RDW: 13.3 % (ref 11.5–15.5)
WBC: 5.2 10*3/uL (ref 4.0–10.5)
nRBC: 0 % (ref 0.0–0.2)

## 2019-06-27 LAB — BASIC METABOLIC PANEL
Anion gap: 11 (ref 5–15)
BUN: 13 mg/dL (ref 6–20)
CO2: 23 mmol/L (ref 22–32)
Calcium: 9.3 mg/dL (ref 8.9–10.3)
Chloride: 105 mmol/L (ref 98–111)
Creatinine, Ser: 0.69 mg/dL (ref 0.44–1.00)
GFR calc Af Amer: >60 mL/min (ref >60)
GFR calc non Af Amer: >60 mL/min (ref >60)
Glucose, Bld: 118 mg/dL — ABNORMAL HIGH (ref 70–99)
Potassium: 4 mmol/L (ref 3.5–5.1)
Sodium: 139 mmol/L (ref 135–145)

## 2019-06-28 ENCOUNTER — Telehealth: Payer: Self-pay | Admitting: Physician Assistant

## 2019-06-28 ENCOUNTER — Inpatient Hospital Stay
Admission: EM | Admit: 2019-06-28 | Discharge: 2019-06-30 | DRG: 660 | Disposition: A | Discharge status: Home / Self Care | Payer: BC Managed Care – PPO | Source: Ambulatory Visit | Attending: Family Medicine | Admitting: Family Medicine

## 2019-06-28 ENCOUNTER — Encounter: Payer: Self-pay | Admitting: Physician Assistant

## 2019-06-28 ENCOUNTER — Other Ambulatory Visit: Payer: Self-pay

## 2019-06-28 ENCOUNTER — Ambulatory Visit (INDEPENDENT_AMBULATORY_CARE_PROVIDER_SITE_OTHER): Payer: BC Managed Care – PPO | Admitting: Physician Assistant

## 2019-06-28 ENCOUNTER — Encounter: Payer: Self-pay | Admitting: Emergency Medicine

## 2019-06-28 ENCOUNTER — Inpatient Hospital Stay
Admission: RE | Admit: 2019-06-28 | Discharge: 2019-06-28 | Disposition: A | Discharge status: Home / Self Care | Payer: BC Managed Care – PPO | Source: Ambulatory Visit

## 2019-06-28 VITALS — BP 210/137 | HR 114 | Ht 64.0 in | Wt 195.0 lb

## 2019-06-28 DIAGNOSIS — I10 Essential (primary) hypertension: Secondary | ICD-10-CM | POA: Diagnosis present

## 2019-06-28 DIAGNOSIS — Z882 Allergy status to sulfonamides status: Secondary | ICD-10-CM

## 2019-06-28 DIAGNOSIS — Z885 Allergy status to narcotic agent status: Secondary | ICD-10-CM

## 2019-06-28 DIAGNOSIS — Z91048 Other nonmedicinal substance allergy status: Secondary | ICD-10-CM

## 2019-06-28 DIAGNOSIS — Z79899 Other long term (current) drug therapy: Secondary | ICD-10-CM

## 2019-06-28 DIAGNOSIS — E039 Hypothyroidism, unspecified: Secondary | ICD-10-CM | POA: Diagnosis present

## 2019-06-28 DIAGNOSIS — F17200 Nicotine dependence, unspecified, uncomplicated: Secondary | ICD-10-CM | POA: Diagnosis present

## 2019-06-28 DIAGNOSIS — E282 Polycystic ovarian syndrome: Secondary | ICD-10-CM | POA: Diagnosis present

## 2019-06-28 DIAGNOSIS — Z91040 Latex allergy status: Secondary | ICD-10-CM

## 2019-06-28 DIAGNOSIS — Z888 Allergy status to other drugs, medicaments and biological substances status: Secondary | ICD-10-CM

## 2019-06-28 DIAGNOSIS — Z79891 Long term (current) use of opiate analgesic: Secondary | ICD-10-CM

## 2019-06-28 DIAGNOSIS — Z881 Allergy status to other antibiotic agents status: Secondary | ICD-10-CM

## 2019-06-28 DIAGNOSIS — N201 Calculus of ureter: Secondary | ICD-10-CM | POA: Diagnosis not present

## 2019-06-28 DIAGNOSIS — Z79890 Hormone replacement therapy (postmenopausal): Secondary | ICD-10-CM

## 2019-06-28 DIAGNOSIS — J45909 Unspecified asthma, uncomplicated: Secondary | ICD-10-CM | POA: Diagnosis present

## 2019-06-28 DIAGNOSIS — N200 Calculus of kidney: Secondary | ICD-10-CM

## 2019-06-28 DIAGNOSIS — Z7951 Long term (current) use of inhaled steroids: Secondary | ICD-10-CM

## 2019-06-28 DIAGNOSIS — G500 Trigeminal neuralgia: Secondary | ICD-10-CM | POA: Diagnosis present

## 2019-06-28 DIAGNOSIS — Z90710 Acquired absence of both cervix and uterus: Secondary | ICD-10-CM

## 2019-06-28 DIAGNOSIS — Z87442 Personal history of urinary calculi: Secondary | ICD-10-CM

## 2019-06-28 DIAGNOSIS — B9620 Unspecified Escherichia coli [E. coli] as the cause of diseases classified elsewhere: Secondary | ICD-10-CM | POA: Diagnosis present

## 2019-06-28 DIAGNOSIS — R1030 Lower abdominal pain, unspecified: Secondary | ICD-10-CM

## 2019-06-28 DIAGNOSIS — N202 Calculus of kidney with calculus of ureter: Principal | ICD-10-CM | POA: Diagnosis present

## 2019-06-28 DIAGNOSIS — Z87440 Personal history of urinary (tract) infections: Secondary | ICD-10-CM

## 2019-06-28 DIAGNOSIS — N390 Urinary tract infection, site not specified: Secondary | ICD-10-CM | POA: Diagnosis present

## 2019-06-28 DIAGNOSIS — K219 Gastro-esophageal reflux disease without esophagitis: Secondary | ICD-10-CM | POA: Diagnosis present

## 2019-06-28 DIAGNOSIS — Z7984 Long term (current) use of oral hypoglycemic drugs: Secondary | ICD-10-CM

## 2019-06-28 DIAGNOSIS — Q615 Medullary cystic kidney: Secondary | ICD-10-CM

## 2019-06-28 DIAGNOSIS — Z20822 Contact with and (suspected) exposure to covid-19: Secondary | ICD-10-CM | POA: Diagnosis present

## 2019-06-28 DIAGNOSIS — E119 Type 2 diabetes mellitus without complications: Secondary | ICD-10-CM | POA: Diagnosis present

## 2019-06-28 DIAGNOSIS — I160 Hypertensive urgency: Secondary | ICD-10-CM | POA: Diagnosis present

## 2019-06-28 DIAGNOSIS — Z780 Asymptomatic menopausal state: Secondary | ICD-10-CM

## 2019-06-28 LAB — URINALYSIS, COMPLETE (UACMP) WITH MICROSCOPIC
Bilirubin Urine: NEGATIVE
Glucose, UA: NEGATIVE mg/dL
Hgb urine dipstick: NEGATIVE
Ketones, ur: NEGATIVE mg/dL
Leukocytes,Ua: NEGATIVE
Nitrite: NEGATIVE
Protein, ur: NEGATIVE mg/dL
Specific Gravity, Urine: 1.009 (ref 1.005–1.030)
pH: 7 (ref 5.0–8.0)

## 2019-06-28 LAB — COMPREHENSIVE METABOLIC PANEL
ALT: 23 U/L (ref 0–44)
AST: 22 U/L (ref 15–41)
Albumin: 4.3 g/dL (ref 3.5–5.0)
Alkaline Phosphatase: 68 U/L (ref 38–126)
Anion gap: 11 (ref 5–15)
BUN: 15 mg/dL (ref 6–20)
CO2: 23 mmol/L (ref 22–32)
Calcium: 9.3 mg/dL (ref 8.9–10.3)
Chloride: 103 mmol/L (ref 98–111)
Creatinine, Ser: 0.64 mg/dL (ref 0.44–1.00)
GFR calc Af Amer: >60 mL/min (ref >60)
GFR calc non Af Amer: >60 mL/min (ref >60)
Glucose, Bld: 103 mg/dL — ABNORMAL HIGH (ref 70–99)
Potassium: 4 mmol/L (ref 3.5–5.1)
Sodium: 137 mmol/L (ref 135–145)
Total Bilirubin: 0.7 mg/dL (ref 0.3–1.2)
Total Protein: 7.4 g/dL (ref 6.5–8.1)

## 2019-06-28 LAB — CBC
HCT: 43.7 % (ref 36.0–46.0)
Hemoglobin: 14.5 g/dL (ref 12.0–15.0)
MCH: 32.5 pg (ref 26.0–34.0)
MCHC: 33.2 g/dL (ref 30.0–36.0)
MCV: 98 fL (ref 80.0–100.0)
Platelets: 317 10*3/uL (ref 150–400)
RBC: 4.46 MIL/uL (ref 3.87–5.11)
RDW: 13.2 % (ref 11.5–15.5)
WBC: 6.3 10*3/uL (ref 4.0–10.5)
nRBC: 0 % (ref 0.0–0.2)

## 2019-06-28 LAB — LIPASE, BLOOD: Lipase: 31 U/L (ref 11–51)

## 2019-06-28 LAB — HEMOGLOBIN A1C
Hgb A1c MFr Bld: 5.5 % (ref 4.8–5.6)
Mean Plasma Glucose: 111.15 mg/dL

## 2019-06-28 LAB — SARS CORONAVIRUS 2 (TAT 6-24 HRS): SARS Coronavirus 2: NEGATIVE

## 2019-06-28 MED ORDER — TAMSULOSIN HCL 0.4 MG PO CAPS
0.4000 mg | ORAL_CAPSULE | Freq: Every day | ORAL | Status: DC
  Administered 2019-06-28 – 2019-06-30 (×2): 0.4 mg via ORAL
  Filled 2019-06-28 (×3): qty 1

## 2019-06-28 MED ORDER — POLYETHYLENE GLYCOL 3350 17 G PO PACK: 17.0000 g | PACK | Freq: Every day | ORAL | Status: DC | PRN

## 2019-06-28 MED ORDER — CEFAZOLIN SODIUM-DEXTROSE 1-4 GM/50ML-% IV SOLN
1.0000 g | Freq: Three times a day (TID) | INTRAVENOUS | Status: DC
  Administered 2019-06-28 – 2019-06-30 (×6): 1 g via INTRAVENOUS
  Filled 2019-06-28 (×11): qty 50

## 2019-06-28 MED ORDER — AMLODIPINE BESYLATE 10 MG PO TABS
10.0000 mg | ORAL_TABLET | Freq: Every day | ORAL | Status: DC
  Administered 2019-06-28 – 2019-06-30 (×3): 10 mg via ORAL
  Filled 2019-06-28 (×3): qty 1

## 2019-06-28 MED ORDER — ACETAMINOPHEN 325 MG PO TABS: 650.0000 mg | ORAL_TABLET | Freq: Four times a day (QID) | ORAL | Status: DC | PRN

## 2019-06-28 MED ORDER — LABETALOL HCL 5 MG/ML IV SOLN: 10.0000 mg | INTRAVENOUS | Status: DC | PRN

## 2019-06-28 MED ORDER — PANTOPRAZOLE SODIUM 40 MG PO TBEC
40.0000 mg | DELAYED_RELEASE_TABLET | Freq: Two times a day (BID) | ORAL | Status: DC
  Administered 2019-06-28 – 2019-06-30 (×4): 40 mg via ORAL
  Filled 2019-06-28 (×4): qty 1

## 2019-06-28 MED ORDER — HYDROCHLOROTHIAZIDE 25 MG PO TABS: 25.0000 mg | ORAL_TABLET | Freq: Every day | ORAL | Status: DC | PRN

## 2019-06-28 MED ORDER — ESTRADIOL 0.05 MG/24HR TD PTTW: 0.0500 mg | MEDICATED_PATCH | TRANSDERMAL | Status: DC

## 2019-06-28 MED ORDER — SODIUM CHLORIDE 0.9% FLUSH
3.0000 mL | Freq: Once | INTRAVENOUS | Status: AC
  Administered 2019-06-28: 3 mL via INTRAVENOUS

## 2019-06-28 MED ORDER — HYDROMORPHONE HCL 1 MG/ML IJ SOLN
1.0000 mg | INTRAMUSCULAR | Status: DC | PRN
  Administered 2019-06-28 – 2019-06-29 (×2): 1 mg via INTRAVENOUS
  Filled 2019-06-28 (×2): qty 1

## 2019-06-28 MED ORDER — SODIUM CHLORIDE 0.9 % IV SOLN: INTRAVENOUS | Status: DC

## 2019-06-28 MED ORDER — LEVOTHYROXINE SODIUM 50 MCG PO TABS
50.0000 ug | ORAL_TABLET | Freq: Every day | ORAL | Status: DC
  Administered 2019-06-29 – 2019-06-30 (×2): 50 ug via ORAL
  Filled 2019-06-28 (×2): qty 1

## 2019-06-28 MED ORDER — ALBUTEROL SULFATE (2.5 MG/3ML) 0.083% IN NEBU: 2.5000 mg | INHALATION_SOLUTION | Freq: Four times a day (QID) | RESPIRATORY_TRACT | Status: DC | PRN

## 2019-06-28 MED ORDER — HYDROCHLOROTHIAZIDE 25 MG PO TABS
25.0000 mg | ORAL_TABLET | Freq: Every day | ORAL | Status: DC
  Filled 2019-06-28 (×2): qty 1

## 2019-06-28 MED ORDER — ONDANSETRON HCL 4 MG/2ML IJ SOLN
4.0000 mg | Freq: Once | INTRAMUSCULAR | Status: AC
  Administered 2019-06-28: 4 mg via INTRAVENOUS
  Filled 2019-06-28: qty 2

## 2019-06-28 MED ORDER — ONDANSETRON HCL 4 MG/2ML IJ SOLN
4.0000 mg | Freq: Four times a day (QID) | INTRAMUSCULAR | Status: DC | PRN
  Administered 2019-06-28 – 2019-06-29 (×5): 4 mg via INTRAVENOUS
  Filled 2019-06-28 (×3): qty 2

## 2019-06-28 MED ORDER — HYDROCHLOROTHIAZIDE 25 MG PO TABS: 25.0000 mg | ORAL_TABLET | Freq: Every day | ORAL | Status: DC

## 2019-06-28 MED ORDER — SODIUM CHLORIDE 0.9 % IV BOLUS
1000.0000 mL | Freq: Once | INTRAVENOUS | Status: AC
  Administered 2019-06-28: 1000 mL via INTRAVENOUS

## 2019-06-28 MED ORDER — FENTANYL CITRATE (PF) 100 MCG/2ML IJ SOLN
50.0000 ug | Freq: Once | INTRAMUSCULAR | Status: DC
  Filled 2019-06-28: qty 2

## 2019-06-28 MED ORDER — HYDROMORPHONE HCL 1 MG/ML IJ SOLN
1.0000 mg | INTRAMUSCULAR | Status: AC
  Administered 2019-06-28: 1 mg via INTRAVENOUS

## 2019-06-28 MED ORDER — HYDROMORPHONE HCL 1 MG/ML IJ SOLN
INTRAMUSCULAR | Status: AC
  Filled 2019-06-28: qty 1

## 2019-06-28 MED ORDER — MONTELUKAST SODIUM 10 MG PO TABS
10.0000 mg | ORAL_TABLET | Freq: Every day | ORAL | Status: DC
  Administered 2019-06-30: 09:00:00 10 mg via ORAL
  Filled 2019-06-28 (×2): qty 1

## 2019-06-28 MED ORDER — KETOROLAC TROMETHAMINE 30 MG/ML IJ SOLN
30.0000 mg | Freq: Four times a day (QID) | INTRAMUSCULAR | Status: DC | PRN
  Administered 2019-06-28 – 2019-06-29 (×2): 30 mg via INTRAVENOUS
  Filled 2019-06-28 (×2): qty 1

## 2019-06-28 MED ORDER — VITAMIN D 25 MCG (1000 UNIT) PO TABS
3000.0000 [IU] | ORAL_TABLET | Freq: Every day | ORAL | Status: DC
  Administered 2019-06-30: 09:00:00 3000 [IU] via ORAL
  Filled 2019-06-28 (×2): qty 3

## 2019-06-28 MED ORDER — ACETAMINOPHEN 650 MG RE SUPP: 650.0000 mg | Freq: Four times a day (QID) | RECTAL | Status: DC | PRN

## 2019-06-28 MED ORDER — CARBAMAZEPINE 200 MG PO TABS
200.0000 mg | ORAL_TABLET | Freq: Every day | ORAL | Status: DC
  Administered 2019-06-28 – 2019-06-29 (×2): 200 mg via ORAL
  Filled 2019-06-28 (×3): qty 1

## 2019-06-28 MED ORDER — MOMETASONE FURO-FORMOTEROL FUM 200-5 MCG/ACT IN AERO
2.0000 | INHALATION_SPRAY | Freq: Two times a day (BID) | RESPIRATORY_TRACT | Status: DC
  Administered 2019-06-28 – 2019-06-30 (×4): 2 via RESPIRATORY_TRACT
  Filled 2019-06-28: qty 8.8

## 2019-06-28 NOTE — Patient Instructions (Addendum)
Your procedure is scheduled on: Thursday, March 11 Report to Day Surgery on the 2nd floor of the [facility] To find out your arrival time, please call [phone] between 1PM - 3PM on: Wednesday, March 10  REMEMBER: Instructions that are not followed completely may result in serious medical risk, up to and including death; or upon the discretion of your surgeon and anesthesiologist your surgery may need to be rescheduled.  Do not eat food after midnight the night before surgery.  No gum chewing, lozengers or hard candies.  You may however, drink water up to 2 hours before you are scheduled to arrive for your surgery. Do not drink anything within 2 hours of the start of your surgery.  TAKE THESE MEDICATIONS THE MORNING OF SURGERY WITH A SIP OF WATER:  1.  Albuterol inhaler 2.  Amlodipine 3.  Flonase nasal spray 4.  Advair inhaler 5.  Levothyroxine 6.  Omeprazole - (take one the night before and one on the morning of surgery - helps to prevent nausea after surgery.) 7.  Tamsulosin 8.  Tramadol if needed for pain  Use inhalers on the day of surgery and bring to the hospital.  Stop Metformin 2 days prior to surgery.  Take 1/2 of usual insulin dose the night before surgery and none on the morning of surgery.  Follow recommendations from Cardiologist, Pulmonologist or PCP regarding stopping Aspirin, Coumadin, Plavix, Eliquis, Pradaxa, or Pletal.  Stop Anti-inflammatories (NSAIDS) such as Advil, Aleve, Ibuprofen, Motrin, Naproxen, Naprosyn and Aspirin based products such as Excedrin, Goodys Powder, BC Powder. (May take Tylenol or Acetaminophen if needed.)  Stop ANY OVER THE COUNTER supplements until after surgery. (May continue Vitamin D, Vitamin B, and multivitamin.)  No Alcohol for 24 hours before or after surgery.  No Smoking including e-cigarettes for 24 hours prior to surgery.  No chewable tobacco products for at least 6 hours prior to surgery.  No nicotine patches on the  day of surgery.  On the morning of surgery brush your teeth with toothpaste and water, you may rinse your mouth with mouthwash if you wish. Do not swallow any toothpaste or mouthwash.  Do not wear jewelry, make-up, hairpins, clips or nail polish.  Do not wear lotions, powders, or perfumes.   Do not shave 48 hours prior to surgery.   Contact lenses, hearing aids and dentures may not be worn into surgery.  Do not bring valuables to the hospital, including drivers license, insurance or credit cards.  [facility] is not responsible for any belongings or valuables.   Notify your doctor if there is any change in your medical condition (cold, fever, infection).  Wear comfortable clothing (specific to your surgery type) to the hospital.  If you are being discharged the day of surgery, you will not be allowed to drive home. You will need a responsible adult to drive you home and stay with you that night.   If you are taking public transportation, you will need to have a responsible adult with you. Please confirm with your physician that it is acceptable to use public transportation.   Please call [phone] if you have any questions about these instructions.  Visitation Policy:  Patients undergoing a surgery or procedure in a hospital may have one family member or support person with them as long as that person is not COVID-19 positive or experiencing its symptoms. That person may remain in the waiting area during the procedure. Should the patient need to stay at the  hospital during part of their recovery, the support person may visit during visiting hours; 10 am to 8 pm.

## 2019-06-28 NOTE — Consult Note (Signed)
   Urology Consult  I have been asked to see the patient by Dr. Stafford, for evaluation and management of right ureteral stone.  Chief Complaint: RLQ pain  History of Present Illness: Megan [name] is a 59 y.o. year old female with PMH medullary sponge kidney, recurrent nephrolithiasis, recurrent UTI and a known current 3 mm right UVJ stone scheduled to undergo right ureteroscopy with laser lithotripsy and stent placement tomorrow with Dr. Brandon who presented to the ED today with acute worsening of RLQ pain, hypertension, and nausea.  She was originally evaluated in clinic by me on 06/23/2019 with reports of a 1 week history of dysuria, bilateral back pain, urinary urgency, urge incontinence, nausea, chills, and right-sided suprapubic pain.  KUB that day revealed stable bilateral nephrolithiasis.  UA grossly infected.  She received 1 dose of ceftriaxone IM in clinic and was sent home on p.o. Keflex with concern for UTI versus early pyelonephritis.  Urine culture ultimately resulted with pansensitive E. coli.  She followed up with our clinic on 06/26/2019 with reports that she was not feeling better despite antibiotic therapy.  She underwent stat CT stone study at that time for further evaluation which revealed a 3 mm right UVJ stone without upstream hydroureteronephrosis as well as bilateral nonobstructing nephrolithiasis R>L.  She was counseled to continue prescribed Keflex and start Flomax, Zofran, and tramadol for management of her acute stone episode.  She was scheduled to undergo definitive stone management as described above tomorrow, 06/29/2019.  This morning, she followed up with our clinic again with reports of a 1 day acute worsening of her RLQ pain refractory to tramadol.  She also reported worsening nausea and uncontrollable hypertension.  She was noted to be visibly uncomfortable with flushed facies and tearful in clinic.  She was offered a dose of Toradol in clinic versus further  evaluation in the emergency department.  She elected to proceed to the emergency department.  ED labs notable for UA with many bacteria without nitrites, pyuria, or microscopic hematuria.  Creatinine stable at 0.64.  WBC count normal at 6.3. She underwent preoperative testing yesterday, Covid swab negative.  She has been started on Dilaudid for pain management and cefazolin for continued UTI treatment.  She is afebrile and persistently hypertensive to the 200s/100s.  This afternoon, she reports significant improvement in her RLQ pain.  She continues to have intermittent stabbing discomfort in this area but overall reports symptom relief.  Past Medical History:  Diagnosis Date  . Asthma   . Diabetes mellitus without complication (HCC)   . Hypertension   . Thyroid disease     Past Surgical History:  Procedure Laterality Date  . ABDOMINAL HYSTERECTOMY    . APPENDECTOMY    . FEMORAL OSTEOTOMY W/ APPLICATION EXTERNAL FIXATOR Right   . ROTATOR CUFF REPAIR      Home Medications:  Current Meds  Medication Sig  . albuterol (PROVENTIL HFA;VENTOLIN HFA) 108 (90 Base) MCG/ACT inhaler Inhale 1-2 puffs into the lungs every 6 (six) hours as needed for wheezing or shortness of breath.  . amLODipine (NORVASC) 10 MG tablet Take 10 mg by mouth daily.   . carbamazepine (TEGRETOL) 200 MG tablet Take 200 mg by mouth at bedtime.   . cephALEXin (KEFLEX) 500 MG capsule Take 1 capsule (500 mg total) by mouth 2 (two) times daily for 7 days.  . Cholecalciferol (VITAMIN D3) 75 MCG (3000 UT) TABS Take 3,000 Units by mouth daily.  . fluticasone (FLONASE) 50 MCG/ACT nasal spray   Place 2 sprays into both nostrils in the morning and at bedtime.  . Fluticasone-Salmeterol (ADVAIR) 250-50 MCG/DOSE AEPB Inhale 1 puff into the lungs 2 (two) times daily.  . hydrochlorothiazide (HYDRODIURIL) 25 MG tablet Take 25 mg by mouth daily as needed (kidney function).   . levothyroxine (SYNTHROID) 50 MCG tablet Take 50 mcg by mouth  daily before breakfast.  . metFORMIN (GLUCOPHAGE-XR) 500 MG 24 hr tablet Take 500 mg by mouth 2 (two) times daily with a meal.  . montelukast (SINGULAIR) 10 MG tablet Take 10 mg by mouth daily.   . omeprazole (PRILOSEC) 20 MG capsule Take 20 mg by mouth 2 (two) times daily before a meal.  . ondansetron (ZOFRAN) 4 MG tablet Take 1 tablet (4 mg total) by mouth every 8 (eight) hours as needed for up to 14 days.  . tamsulosin (FLOMAX) 0.4 MG CAPS capsule Take 1 capsule (0.4 mg total) by mouth daily for 14 days.  . traMADol (ULTRAM) 50 MG tablet Take 1 tablet (50 mg total) by mouth every 6 (six) hours as needed.    Allergies:  Allergies  Allergen Reactions  . Latex Other (See Comments) and Rash    Blisters  Blisters Blisters    . Morphine Anaphylaxis, Shortness Of Breath, Nausea And Vomiting, Swelling and Other (See Comments)    Ha's and facial swelling   . Nickel Swelling and Rash  . Petrolatum Rash and Other (See Comments)    Blister All petroleum products per patient, facial swelling   . Tape Rash    Pt can use paper tape  . Erythromycin Nausea Only and Nausea And Vomiting    Nausea/vomiting   . Erythromycin Ethylsuccinate Nausea And Vomiting  . Hydromorphone Hcl Other (See Comments)    Bad headache  . Other Other (See Comments)    Generic medications per patient- redness and burning in gums   . Sulfa Antibiotics Rash         History reviewed. No pertinent family history.  Social History:  reports that she has been smoking. She has never used smokeless tobacco. She reports current alcohol use. She reports that she does not use drugs.  ROS: A complete review of systems was performed.  All systems are negative except for pertinent findings as noted.  Physical Exam:  Vital signs in last 24 hours: Temp:  [97.7 F (36.5 C)-98.1 F (36.7 C)] 97.7 F (36.5 C) (03/10 1718) Pulse Rate:  [83-114] 86 (03/10 1557) Resp:  [12-22] 16 (03/10 1719) BP: (179-217)/(89-137) 213/99  (03/10 1718) SpO2:  [98 %-99 %] 98 % (03/10 1718) Weight:  [88.5 kg] 88.5 kg (03/10 1130) Constitutional:  Alert and oriented, no acute distress HEENT: [place] AT, moist mucus membranes Cardiovascular: No clubbing, cyanosis, or edema. Respiratory: Normal respiratory effort Skin: No rashes, bruises or suspicious lesions Neurologic: Grossly intact, no focal deficits, moving all 4 extremities Psychiatric: Normal mood and affect  Laboratory Data:  Recent Labs    06/27/19 1031 06/28/19 1133  WBC 5.2 6.3  HGB 14.1 14.5  HCT 42.6 43.7   Recent Labs    06/27/19 1031 06/28/19 1133  NA 139 137  K 4.0 4.0  CL 105 103  CO2 23 23  GLUCOSE 118* 103*  BUN 13 15  CREATININE 0.69 0.64  CALCIUM 9.3 9.3   Urinalysis    Component Value Date/Time   COLORURINE YELLOW (A) 06/28/2019 1132   APPEARANCEUR HAZY (A) 06/28/2019 1132   APPEARANCEUR Cloudy (A) 06/23/2019 1541   LABSPEC 1.009   06/28/2019 1132   PHURINE 7.0 06/28/2019 1132   GLUCOSEU NEGATIVE 06/28/2019 1132   HGBUR NEGATIVE 06/28/2019 1132   BILIRUBINUR NEGATIVE 06/28/2019 1132   BILIRUBINUR Negative 06/23/2019 1541   KETONESUR NEGATIVE 06/28/2019 1132   PROTEINUR NEGATIVE 06/28/2019 1132   NITRITE NEGATIVE 06/28/2019 1132   LEUKOCYTESUR NEGATIVE 06/28/2019 1132   Results for orders placed or performed during the hospital encounter of 06/27/19  SARS CORONAVIRUS 2 (TAT 6-24 HRS) Nasopharyngeal Nasopharyngeal Swab     Status: None   Collection Time: 06/27/19 10:31 AM   Specimen: Nasopharyngeal Swab  Result Value Ref Range Status   SARS Coronavirus 2 NEGATIVE NEGATIVE Final    Comment: (NOTE) SARS-CoV-2 target nucleic acids are NOT DETECTED. The SARS-CoV-2 RNA is generally detectable in upper and lower respiratory specimens during the acute phase of infection. Negative results do not preclude SARS-CoV-2 infection, do not rule out co-infections with other pathogens, and should not be used as the sole basis for treatment or other  patient management decisions. Negative results must be combined with clinical observations, patient history, and epidemiological information. The expected result is Negative. Fact Sheet for Patients: https://www.fda.gov/media/138098/download Fact Sheet for Healthcare Providers: https://www.fda.gov/media/138095/download This test is not yet approved or cleared by the [place] FDA and  has been authorized for detection and/or diagnosis of SARS-CoV-2 by FDA under an Emergency Use Authorization (EUA). This EUA will remain  in effect (meaning this test can be used) for the duration of the COVID-19 declaration under Section 56 4(b)(1) of the Act, 21 U.S.C. section 360bbb-3(b)(1), unless the authorization is terminated or revoked sooner. Performed at [facility], [address] [place], [place] [address]    Assessment & Plan:  59-year-old female with PMH medullary sponge kidney and recurrent nephrolithiasis presents with refractory RLQ pain in the setting of a known 3 mm right UVJ stone in advance of scheduled right ureteroscopy with laser lithotripsy and stent placement tomorrow.  Agree with admission overnight for pain control, BP control, and IV fluids.  We will continue with our plans for right URS/LL tomorrow.  Agree with cefazolin and pain management in the interim.  Please make her n.p.o. at midnight in advance of surgery.  Labs reassuring for acute urinary obstruction, no repeat imaging necessary.  Thank you for involving me in this patient's care, I will continue to follow along.  Demika Langenderfer, PA-C 06/28/2019 5:38 PM   

## 2019-06-28 NOTE — ED Triage Notes (Signed)
Pt presents to ED via POV with c/o RLQ abdominal pain, sent from urologists office with c/o worsening pain and HTN. Pt states scheduled for surgery tomorrow for stent placement, pt states pain medication at home is not controlling pain, states taking tramadol at home, states last dose taken at approx 0400 this morning. Pt also c/o R lower back pain as well.   Pt has hx of HTN, states normally takes BP meds at night, states BP has been increased x 3 days and has been unable to control it with home meds.

## 2019-06-28 NOTE — ED Provider Notes (Addendum)
[facility] Emergency Department Provider Note  ____________________________________________  Time seen: Approximately 2:59 PM  I have reviewed the triage vital signs and the nursing notes.   HISTORY  Chief Complaint Abdominal Pain    HPI Megan [name] is a 59 y.o. female with a history of hypertension diabetes and medullary sponge kidney disease who comes the ED today complaining of right lower quadrant abdominal pain.  This has been persistent for the past 2 weeks, gradually worsening, no aggravating or alleviating factors.  She has seen urology, been diagnosed with a 3 mm distal ureteral stone which has failed to pass, plan for ureteroscopy with stent and laser lithotripsy tomorrow.  However, despite using tramadol at home, pain has escalated to the point that it is unbearable and patient is having nausea vomiting and inability to eat or drink or take her medications.  I reviewed her allergy profile with her, and she discussed with her husband and notes that she is able to tolerate Dilaudid and fentanyl despite inability to take morphine or oxycodone.      Past Medical History:  Diagnosis Date  . Asthma   . Diabetes mellitus without complication ([place])   . Hypertension   . Thyroid disease      There are no problems to display for this patient.    Past Surgical History:  Procedure Laterality Date  . ABDOMINAL HYSTERECTOMY    . APPENDECTOMY    . FEMORAL OSTEOTOMY W/ APPLICATION EXTERNAL FIXATOR Right   . ROTATOR CUFF REPAIR       Prior to Admission medications   Medication Sig Start Date End Date Taking? Authorizing Provider  albuterol (PROVENTIL HFA;VENTOLIN HFA) 108 (90 Base) MCG/ACT inhaler Inhale 1-2 puffs into the lungs every 6 (six) hours as needed for wheezing or shortness of breath. 07/19/18   Volanda Napoleon, PA-C  amLODipine (NORVASC) 10 MG tablet Take 10 mg by mouth daily.  11/15/17   [provider]  carbamazepine (TEGRETOL)  200 MG tablet Take 200 mg by mouth at bedtime.  02/12/19   [provider]  celecoxib (CELEBREX) 200 MG capsule Take 200 mg by mouth 2 (two) times daily.  03/04/16   [provider]  cephALEXin (KEFLEX) 500 MG capsule Take 1 capsule (500 mg total) by mouth 2 (two) times daily for 7 days. 06/23/19 06/30/19  Debroah Loop, PA-C  Cholecalciferol (VITAMIN D3) 75 MCG (3000 UT) TABS Take 3,000 Units by mouth daily.    [provider]  estradiol (VIVELLE-DOT) 0.05 MG/24HR patch Place 1 patch onto the skin every 3 (three) days.  09/29/15   [provider]  fluticasone (FLONASE) 50 MCG/ACT nasal spray Place 2 sprays into both nostrils in the morning and at bedtime.    [provider]  Fluticasone-Salmeterol (ADVAIR) 250-50 MCG/DOSE AEPB Inhale 1 puff into the lungs 2 (two) times daily.    [provider]  hydrochlorothiazide (HYDRODIURIL) 25 MG tablet Take 25 mg by mouth daily as needed (kidney function).  06/28/17   [provider]  levothyroxine (SYNTHROID) 50 MCG tablet Take 50 mcg by mouth daily before breakfast.    [provider]  metFORMIN (GLUCOPHAGE-XR) 500 MG 24 hr tablet Take 500 mg by mouth 2 (two) times daily with a meal.    [provider]  montelukast (SINGULAIR) 10 MG tablet Take 10 mg by mouth daily.  11/10/18   [provider]  omeprazole (PRILOSEC) 20 MG capsule Take 20 mg by mouth 2 (two) times daily  before a meal.    [provider]  ondansetron (ZOFRAN) 4 MG tablet Take 1 tablet (4 mg total) by mouth every 8 (eight) hours as needed for up to 14 days. 06/26/19 07/10/19  Carman Ching, PA-C  tamsulosin (FLOMAX) 0.4 MG CAPS capsule Take 1 capsule (0.4 mg total) by mouth daily for 14 days. 06/26/19 07/10/19  Carman Ching, PA-C  traMADol (ULTRAM) 50 MG tablet Take 1 tablet (50 mg total) by mouth every 6 (six) hours as needed. 06/26/19   Carman Ching, PA-C      Allergies Latex, Morphine, Nickel, Petrolatum, Tape, Erythromycin, Erythromycin ethylsuccinate, Hydromorphone hcl, Other, and Sulfa antibiotics   No family history on file.  Social History Social History   Tobacco Use  . Smoking status: Current Some Day Smoker  . Smokeless tobacco: Never Used  Substance Use Topics  . Alcohol use: Yes  . Drug use: Never    Review of Systems  Constitutional:   No fever or chills.  ENT:   No sore throat. No rhinorrhea. Cardiovascular:   No chest pain or syncope. Respiratory:   No dyspnea or cough. Gastrointestinal: Positive right lower quadrant abdominal pain and vomiting as above.  No constipation. Musculoskeletal:   Negative for focal pain or swelling All other systems reviewed and are negative except as documented above in ROS and HPI.  ____________________________________________   PHYSICAL EXAM:  VITAL SIGNS: ED Triage Vitals  Enc Vitals Group     BP 06/28/19 1129 (!) 212/111     Pulse Rate 06/28/19 1129 96     Resp 06/28/19 1129 (!) 22     Temp 06/28/19 1129 98.1 F (36.7 C)     Temp Source 06/28/19 1129 Oral     SpO2 06/28/19 1129 98 %     Weight 06/28/19 1130 195 lb (88.5 kg)     Height 06/28/19 1130 5\' 4"  (1.626 m)     Head Circumference --      Peak Flow --      Pain Score 06/28/19 1130 10     Pain Loc --      Pain Edu? --      Excl. in GC? --     Vital signs reviewed, nursing assessments reviewed.   Constitutional:   Alert and oriented. Non-toxic appearance. Eyes:   Conjunctivae are normal. EOMI. PERRL. ENT      Head:   Normocephalic and atraumatic.      Nose:   Wearing a mask.      Mouth/Throat:   Wearing a mask.      Neck:   No meningismus. Full ROM. Hematological/Lymphatic/Immunilogical:   No cervical lymphadenopathy. Cardiovascular:   RRR. Symmetric bilateral radial and DP pulses.  No murmurs. Cap refill less than 2 seconds. Respiratory:   Normal respiratory effort without tachypnea/retractions.  Breath sounds are clear and equal bilaterally. No wheezes/rales/rhonchi. Gastrointestinal:   Soft with right lower quadrant and suprapubic tenderness. Non distended. There is no CVA tenderness.  No rebound, rigidity, or guarding.  Musculoskeletal:   Normal range of motion in all extremities. No joint effusions.  No lower extremity tenderness.  No edema. Neurologic:   Normal speech and language.  Motor grossly intact. No acute focal neurologic deficits are appreciated.  Skin:    Skin is warm, dry and intact. No rash noted.  No petechiae, purpura, or bullae.  ____________________________________________    LABS (pertinent positives/negatives) (all labs ordered are listed, but only abnormal results are displayed) Labs Reviewed  COMPREHENSIVE METABOLIC PANEL -  Abnormal; Notable for the following components:      Result Value   Glucose, Bld 103 (*)    All other components within normal limits  URINALYSIS, COMPLETE (UACMP) WITH MICROSCOPIC - Abnormal; Notable for the following components:   Color, Urine YELLOW (*)    APPearance HAZY (*)    Bacteria, UA MANY (*)    All other components within normal limits  URINE CULTURE  LIPASE, BLOOD  CBC   ____________________________________________   EKG Interpreted by me  Date: 06/28/2019  Rate: 68  Rhythm: normal sinus rhythm  QRS Axis: normal  Intervals: normal  ST/T Wave abnormalities: normal  Conduction Disutrbances: none  Narrative Interpretation: unremarkable       ____________________________________________    RADIOLOGY  No results found.  ____________________________________________   PROCEDURES Procedures  ____________________________________________    CLINICAL IMPRESSION / ASSESSMENT AND PLAN / ED COURSE  Medications ordered in the ED: Medications  sodium chloride flush (NS) 0.9 % injection 3 mL (has no administration in time range)  sodium chloride 0.9 % bolus 1,000 mL (1,000 mLs Intravenous Bolus from  Bag 06/28/19 1431)  ondansetron (ZOFRAN) injection 4 mg (4 mg Intravenous Given 06/28/19 1430)  HYDROmorphone (DILAUDID) injection 1 mg (1 mg Intravenous Given 06/28/19 1435)    Pertinent labs & imaging results that were available during my care of the patient were reviewed by me and considered in my medical decision making (see chart for details).  Genasis Zingale was evaluated in Emergency Department on 06/28/2019 for the symptoms described in the history of present illness. She was evaluated in the context of the global COVID-19 pandemic, which necessitated consideration that the patient might be at risk for infection with the SARS-CoV-2 virus that causes COVID-19. Institutional protocols and algorithms that pertain to the evaluation of patients at risk for COVID-19 are in a state of rapid change based on information released by regulatory bodies including the CDC and federal and state organizations. These policies and algorithms were followed during the patient's care in the ED.   Patient presents with worsening, intractable right lower quadrant abdominal pain related to 3 mm ureteral stone.  Planned for ureteroscopy tomorrow by urology.  No other apparent abdominal pathology on evaluation.  Has had appendectomy in the past.  She has had preop testing including Covid screening which is negative.  At this point, she will need to be hospitalized for pain control, blood pressure control, hydration and advance of procedure.  Discussed with urology who has no further recommendations at this time other than n.p.o. after midnight.  Discussed with hospitalist.      ____________________________________________   FINAL CLINICAL IMPRESSION(S) / ED DIAGNOSES    Final diagnoses:  Ureterolithiasis  Intractable lower abdominal pain  Uncontrolled hypertension  Type 2 diabetes mellitus without complication, without long-term current use of insulin [facility])     ED Discharge Orders    None      Portions  of this note were generated with dragon dictation software. Dictation errors may occur despite best attempts at proofreading.   Sharman Cheek, MD 06/28/19 1504    Sharman Cheek, MD 06/28/19 1626

## 2019-06-28 NOTE — H&P (Addendum)
History and Physical    Megan [name] NID:[id number] DOB: [birth year] DOA: 06/28/2019  PCP: Leonel Ramsay, MD  Patient coming from: home  I have personally briefly reviewed patient's old medical records in [place]  Chief Complaint: abdominal pain  HPI: Megan [name] is Megan [name] 59 y.o. female with medical history significant of medullary sponge kidney, recurrent nephrolithiasis, recurrent UTI's, hypothyroidism, asthma, PCOS, and multiple other medical problems presenting with abdominal pain.  She notes feeling run down for about 3 weeks.  Last Monday (9 days ago) she started noticing urgency, dysuria, typical for UTI.  She noted progressive sx with backache, nausea.  She noted progressive nausea, dysuria, and incontinence as well.  She saw urology on 3/5 and was started on abx.  She also had imaging done.  Urology was planning for ureteroscopy with laser lithotripsy and stent placement, but her pain progressively worsened.  She was seen in urology clinic today with progressive flank pain.  Due to her progressive pain, she presented to the emergency room.  Denies smoking, etoh.  +chills, no fevers.  No CP, no SOB. + nausea, vomiting.  No hematuria.  ED Course: Labs, analgesia, antiemetics, EKG.    Review of Systems: As per HPI otherwise 10 point review of systems negative.   Past Medical History:  Diagnosis Date  . Asthma   . Diabetes mellitus without complication ([place])   . Hypertension   . Thyroid disease     Past Surgical History:  Procedure Laterality Date  . ABDOMINAL HYSTERECTOMY    . APPENDECTOMY    . FEMORAL OSTEOTOMY W/ APPLICATION EXTERNAL FIXATOR Right   . ROTATOR CUFF REPAIR       reports that she has been smoking. She has never used smokeless tobacco. She reports current alcohol use. She reports that she does not use drugs.  Allergies  Allergen Reactions  . Latex Other (See Comments) and Rash    Blisters  Blisters Blisters    . Morphine Anaphylaxis,  Shortness Of Breath, Nausea And Vomiting, Swelling and Other (See Comments)    Ha's and facial swelling   . Nickel Swelling and Rash  . Petrolatum Rash and Other (See Comments)    Blister All petroleum products per patient, facial swelling   . Tape Rash    Pt can use paper tape  . Erythromycin Nausea Only and Nausea And Vomiting    Nausea/vomiting   . Erythromycin Ethylsuccinate Nausea And Vomiting  . Hydromorphone Hcl Other (See Comments)    Bad headache  . Other Other (See Comments)    Generic medications per patient- redness and burning in gums   . Sulfa Antibiotics Rash         No family history on file.  Prior to Admission medications   Medication Sig Start Date End Date Taking? Authorizing Provider  albuterol (PROVENTIL HFA;VENTOLIN HFA) 108 (90 Base) MCG/ACT inhaler Inhale 1-2 puffs into the lungs every 6 (six) hours as needed for wheezing or shortness of breath. 07/19/18  Yes [facility] Lan[facility] Eastyn Dattilo, PA-C  amLODipine (NORVASC) 10 MG tablet Take 10 mg by mouth daily.  11/15/17  Yes [provider]  carbamazepine (TEGRETOL) 200 MG tablet Take 200 mg by mouth at bedtime.  02/12/19  Yes [provider]  cephALEXin (KEFLEX) 500 MG capsule Take 1 capsule (500 mg total) by mouth 2 (two) times daily for 7 days. 06/23/19 06/30/19 Yes Vaillancourt, Aldona Bar, PA-C  Cholecalciferol (VITAMIN D3) 75 MCG (3000 UT) TABS Take 3,000 Units by mouth daily.  Yes [provider]  fluticasone (FLONASE) 50 MCG/ACT nasal spray Place 2 sprays into both nostrils in the morning and at bedtime.   Yes [provider]  Fluticasone-Salmeterol (ADVAIR) 250-50 MCG/DOSE AEPB Inhale 1 puff into the lungs 2 (two) times daily.   Yes [provider]  hydrochlorothiazide (HYDRODIURIL) 25 MG tablet Take 25 mg by mouth daily as needed (kidney function).  06/28/17  Yes [provider]  levothyroxine (SYNTHROID) 50 MCG tablet Take 50 mcg by mouth daily before breakfast.    Yes [provider]  metFORMIN (GLUCOPHAGE-XR) 500 MG 24 hr tablet Take 500 mg by mouth 2 (two) times daily with Aylssa Herrig meal.   Yes [provider]  montelukast (SINGULAIR) 10 MG tablet Take 10 mg by mouth daily.  11/10/18  Yes [provider]  omeprazole (PRILOSEC) 20 MG capsule Take 20 mg by mouth 2 (two) times daily before Jairus Tonne meal.   Yes [provider]  ondansetron (ZOFRAN) 4 MG tablet Take 1 tablet (4 mg total) by mouth every 8 (eight) hours as needed for up to 14 days. 06/26/19 07/10/19 Yes Vaillancourt, Lelon Mast, PA-C  tamsulosin (FLOMAX) 0.4 MG CAPS capsule Take 1 capsule (0.4 mg total) by mouth daily for 14 days. 06/26/19 07/10/19 Yes Vaillancourt, Lelon Mast, PA-C  traMADol (ULTRAM) 50 MG tablet Take 1 tablet (50 mg total) by mouth every 6 (six) hours as needed. 06/26/19  Yes Vaillancourt, Samantha, PA-C  celecoxib (CELEBREX) 200 MG capsule Take 200 mg by mouth 2 (two) times daily.  03/04/16   [provider]  estradiol (VIVELLE-DOT) 0.05 MG/24HR patch Place 1 patch onto the skin every 3 (three) days.  09/29/15   [provider]    Physical Exam: Vitals:   06/28/19 1129 06/28/19 1130 06/28/19 1337 06/28/19 1557  BP: (!) 212/111  (!) 198/105 (!) 179/89  Pulse: 96  83 86  Resp: (!) 22  20 18   Temp: 98.1 F (36.7 C)     TempSrc: Oral     SpO2: 98%  99% 98%  Weight:  88.5 kg    Height:  5\' 4"  (1.626 m)      Constitutional: NAD, calm, comfortable Vitals:   06/28/19 1129 06/28/19 1130 06/28/19 1337 06/28/19 1557  BP: (!) 212/111  (!) 198/105 (!) 179/89  Pulse: 96  83 86  Resp: (!) 22  20 18   Temp: 98.1 F (36.7 C)     TempSrc: Oral     SpO2: 98%  99% 98%  Weight:  88.5 kg    Height:  5\' 4"  (1.626 m)     Eyes: PERRL, lids and conjunctivae normal ENMT: Mucous membranes are moist. Posterior pharynx clear of any exudate or lesions.Normal dentition.  Neck: normal, supple, no masses, no thyromegaly Respiratory: unlabored, no accessory muscle  use Cardiovascular: Regular rate and rhythm. No extremity edema.  Abdomen: R CVA tenderness, RLQ>LLQ TTP, no rebound or guarding Musculoskeletal: no clubbing / cyanosis. No joint deformity upper and lower extremities. Good ROM, no contractures. Normal muscle tone.  Skin: no rashes, lesions, ulcers. No induration Neurologic: CN 2-12 grossly intact. Sensation intact. Moving all extremities. Psychiatric: Normal judgment and insight. Alert and oriented x 3. Normal mood.   Labs on Admission: I have personally reviewed following labs and imaging studies  CBC: Recent Labs  Lab 06/27/19 1031 06/28/19 1133  WBC 5.2 6.3  HGB 14.1 14.5  HCT 42.6 43.7  MCV 97.3 98.0  PLT 304 317   Basic Metabolic Panel: Recent Labs  Lab 06/27/19 1031 06/28/19 1133  NA 139 137  K 4.0 4.0  CL 105 103  CO2 23 23  GLUCOSE 118* 103*  BUN 13 15  CREATININE 0.69 0.64  CALCIUM 9.3 9.3   GFR: Estimated Creatinine Clearance: 81.5 mL/min (by C-G formula based on SCr of 0.64 mg/dL). Liver Function Tests: Recent Labs  Lab 06/28/19 1133  AST 22  ALT 23  ALKPHOS 68  BILITOT 0.7  PROT 7.4  ALBUMIN 4.3   Recent Labs  Lab 06/28/19 1133  LIPASE 31   No results for input(s): AMMONIA in the last 168 hours. Coagulation Profile: No results for input(s): INR, PROTIME in the last 168 hours. Cardiac Enzymes: No results for input(s): CKTOTAL, CKMB, CKMBINDEX, TROPONINI in the last 168 hours. BNP (last 3 results) No results for input(s): PROBNP in the last 8760 hours. HbA1C: No results for input(s): HGBA1C in the last 72 hours. CBG: No results for input(s): GLUCAP in the last 168 hours. Lipid Profile: No results for input(s): CHOL, HDL, LDLCALC, TRIG, CHOLHDL, LDLDIRECT in the last 72 hours. Thyroid Function Tests: No results for input(s): TSH, T4TOTAL, FREET4, T3FREE, THYROIDAB in the last 72 hours. Anemia Panel: No results for input(s): VITAMINB12, FOLATE, FERRITIN, TIBC, IRON, RETICCTPCT in the last  72 hours. Urine analysis:    Component Value Date/Time   COLORURINE YELLOW (Rayma Hegg) 06/28/2019 1132   APPEARANCEUR HAZY (Giulio Bertino) 06/28/2019 1132   APPEARANCEUR Cloudy (Khamiya Varin) 06/23/2019 1541   LABSPEC 1.009 06/28/2019 1132   PHURINE 7.0 06/28/2019 1132   GLUCOSEU NEGATIVE 06/28/2019 1132   HGBUR NEGATIVE 06/28/2019 1132   BILIRUBINUR NEGATIVE 06/28/2019 1132   BILIRUBINUR Negative 06/23/2019 1541   KETONESUR NEGATIVE 06/28/2019 1132   PROTEINUR NEGATIVE 06/28/2019 1132   NITRITE NEGATIVE 06/28/2019 1132   LEUKOCYTESUR NEGATIVE 06/28/2019 1132    Radiological Exams on Admission: No results found.  EKG: Independently reviewed. NSR  Assessment/Plan Active Problems:   Nephrolithiasis  E. Coli UTI  Nephrolithiasis  Hx Medullary Sponge Kidney:  Dysuria and typical UTI sx started about 1 week ago followed by sharp lower abdominal pain radiating to groin on R similar to prior stones. CT stone study with bilateral nonobstructive nephrolithiasis and 3 mm distal R ureteral calculus near the UVJ without significant ureteral dilatation or hydronephrosis 3/5 urine cx with pansensitive e. Coli - ancef UA today without nitrite/LE, but many bacteria, will continue IV abx, follow cx Toradol, dilaudid for pain Zofran for nausea Flomax NPO at midnight, plan for R ureteroscopy with lithotripsy on 3/11  Hypertensive Urgency: continue amlodipine, HCTZ.  Suspect her significantly elevated BP's are related to pain related to above.  Treat pain.  Labetalol prn SBP >180.  BP was elevated in urology clinic about 1 week ago as well, but not to this extent. Amlodipine, HCTZ If persistently elevated BP, will need to add additional PO meds  Trigeminal Neuralgia: tegretol  Asthma: albuterol, dulera, singulair  Hypothyroidism: synthroid  Post Menopausal: continue HRT  GERD: continue PPI  Admission COVID testing pending  DVT prophylaxis: SCD  Code Status: full Family Communication: husband Disposition Plan:  pending urology evaluation and procedure on 3/11 Consults called: urology  Admission status: observation    Lacretia Nicks MD [facility] Pager AMION  If 7PM-7AM, please contact night-coverage [email or web address] Use universal [place] password for that web site. If you do not have the password, please call the hospital operator.  06/28/2019, 4:18 PM

## 2019-06-28 NOTE — ED Triage Notes (Signed)
Spoke with EDP regarding labs due to patient stating had pre-op labs done yesterday, per EDP Paduchowski recollect labs. Also spoke with EDP paduchowski regarding pain meds, no new orders due to previous anaphylactic reaction to morphine, apologized and explained to patient unable to give pain medication to patient in triage due to previous reaction and no MD order. Pt states understanding.

## 2019-06-28 NOTE — Telephone Encounter (Signed)
Error

## 2019-06-28 NOTE — H&P (View-Only) (Signed)
Urology Consult  I have been asked to see the patient by Dr. Scotty Court, for evaluation and management of right ureteral stone.  Chief Complaint: RLQ pain  History of Present Illness: Megan [name] is a 59 y.o. year old female with PMH medullary sponge kidney, recurrent nephrolithiasis, recurrent UTI and a known current 3 mm right UVJ stone scheduled to undergo right ureteroscopy with laser lithotripsy and stent placement tomorrow with Dr. Apolinar Junes who presented to the ED today with acute worsening of RLQ pain, hypertension, and nausea.  She was originally evaluated in clinic by me on 06/23/2019 with reports of a 1 week history of dysuria, bilateral back pain, urinary urgency, urge incontinence, nausea, chills, and right-sided suprapubic pain.  KUB that day revealed stable bilateral nephrolithiasis.  UA grossly infected.  She received 1 dose of ceftriaxone IM in clinic and was sent home on p.o. Keflex with concern for UTI versus early pyelonephritis.  Urine culture ultimately resulted with pansensitive E. coli.  She followed up with our clinic on 06/26/2019 with reports that she was not feeling better despite antibiotic therapy.  She underwent stat CT stone study at that time for further evaluation which revealed a 3 mm right UVJ stone without upstream hydroureteronephrosis as well as bilateral nonobstructing nephrolithiasis R>L.  She was counseled to continue prescribed Keflex and start Flomax, Zofran, and tramadol for management of her acute stone episode.  She was scheduled to undergo definitive stone management as described above tomorrow, 06/29/2019.  This morning, she followed up with our clinic again with reports of a 1 day acute worsening of her RLQ pain refractory to tramadol.  She also reported worsening nausea and uncontrollable hypertension.  She was noted to be visibly uncomfortable with flushed facies and tearful in clinic.  She was offered a dose of Toradol in clinic versus further  evaluation in the emergency department.  She elected to proceed to the emergency department.  ED labs notable for UA with many bacteria without nitrites, pyuria, or microscopic hematuria.  Creatinine stable at 0.64.  WBC count normal at 6.3. She underwent preoperative testing yesterday, Covid swab negative.  She has been started on Dilaudid for pain management and cefazolin for continued UTI treatment.  She is afebrile and persistently hypertensive to the 200s/100s.  This afternoon, she reports significant improvement in her RLQ pain.  She continues to have intermittent stabbing discomfort in this area but overall reports symptom relief.  Past Medical History:  Diagnosis Date  . Asthma   . Diabetes mellitus without complication ([facility])   . Hypertension   . Thyroid disease     Past Surgical History:  Procedure Laterality Date  . ABDOMINAL HYSTERECTOMY    . APPENDECTOMY    . FEMORAL OSTEOTOMY W/ APPLICATION EXTERNAL FIXATOR Right   . ROTATOR CUFF REPAIR      Home Medications:  Current Meds  Medication Sig  . albuterol (PROVENTIL HFA;VENTOLIN HFA) 108 (90 Base) MCG/ACT inhaler Inhale 1-2 puffs into the lungs every 6 (six) hours as needed for wheezing or shortness of breath.  Marland Kitchen amLODipine (NORVASC) 10 MG tablet Take 10 mg by mouth daily.   . carbamazepine (TEGRETOL) 200 MG tablet Take 200 mg by mouth at bedtime.   . cephALEXin (KEFLEX) 500 MG capsule Take 1 capsule (500 mg total) by mouth 2 (two) times daily for 7 days.  . Cholecalciferol (VITAMIN D3) 75 MCG (3000 UT) TABS Take 3,000 Units by mouth daily.  . fluticasone (FLONASE) 50 MCG/ACT nasal spray  Place 2 sprays into both nostrils in the morning and at bedtime.  . Fluticasone-Salmeterol (ADVAIR) 250-50 MCG/DOSE AEPB Inhale 1 puff into the lungs 2 (two) times daily.  . hydrochlorothiazide (HYDRODIURIL) 25 MG tablet Take 25 mg by mouth daily as needed (kidney function).   Marland Kitchen levothyroxine (SYNTHROID) 50 MCG tablet Take 50 mcg by mouth  daily before breakfast.  . metFORMIN (GLUCOPHAGE-XR) 500 MG 24 hr tablet Take 500 mg by mouth 2 (two) times daily with a meal.  . montelukast (SINGULAIR) 10 MG tablet Take 10 mg by mouth daily.   Marland Kitchen omeprazole (PRILOSEC) 20 MG capsule Take 20 mg by mouth 2 (two) times daily before a meal.  . ondansetron (ZOFRAN) 4 MG tablet Take 1 tablet (4 mg total) by mouth every 8 (eight) hours as needed for up to 14 days.  . tamsulosin (FLOMAX) 0.4 MG CAPS capsule Take 1 capsule (0.4 mg total) by mouth daily for 14 days.  . traMADol (ULTRAM) 50 MG tablet Take 1 tablet (50 mg total) by mouth every 6 (six) hours as needed.    Allergies:  Allergies  Allergen Reactions  . Latex Other (See Comments) and Rash    Blisters  Blisters Blisters    . Morphine Anaphylaxis, Shortness Of Breath, Nausea And Vomiting, Swelling and Other (See Comments)    Ha's and facial swelling   . Nickel Swelling and Rash  . Petrolatum Rash and Other (See Comments)    Blister All petroleum products per patient, facial swelling   . Tape Rash    Pt can use paper tape  . Erythromycin Nausea Only and Nausea And Vomiting    Nausea/vomiting   . Erythromycin Ethylsuccinate Nausea And Vomiting  . Hydromorphone Hcl Other (See Comments)    Bad headache  . Other Other (See Comments)    Generic medications per patient- redness and burning in gums   . Sulfa Antibiotics Rash         History reviewed. No pertinent family history.  Social History:  reports that she has been smoking. She has never used smokeless tobacco. She reports current alcohol use. She reports that she does not use drugs.  ROS: A complete review of systems was performed.  All systems are negative except for pertinent findings as noted.  Physical Exam:  Vital signs in last 24 hours: Temp:  [97.7 F (36.5 C)-98.1 F (36.7 C)] 97.7 F (36.5 C) (03/10 1718) Pulse Rate:  [83-114] 86 (03/10 1557) Resp:  [12-22] 16 (03/10 1719) BP: (179-217)/(89-137) 213/99  (03/10 1718) SpO2:  [98 %-99 %] 98 % (03/10 1718) Weight:  [88.5 kg] 88.5 kg (03/10 1130) Constitutional:  Alert and oriented, no acute distress HEENT: [place] AT, moist mucus membranes Cardiovascular: No clubbing, cyanosis, or edema. Respiratory: Normal respiratory effort Skin: No rashes, bruises or suspicious lesions Neurologic: Grossly intact, no focal deficits, moving all 4 extremities Psychiatric: Normal mood and affect  Laboratory Data:  Recent Labs    06/27/19 1031 06/28/19 1133  WBC 5.2 6.3  HGB 14.1 14.5  HCT 42.6 43.7   Recent Labs    06/27/19 1031 06/28/19 1133  NA 139 137  K 4.0 4.0  CL 105 103  CO2 23 23  GLUCOSE 118* 103*  BUN 13 15  CREATININE 0.69 0.64  CALCIUM 9.3 9.3   Urinalysis    Component Value Date/Time   COLORURINE YELLOW (A) 06/28/2019 1132   APPEARANCEUR HAZY (A) 06/28/2019 1132   APPEARANCEUR Cloudy (A) 06/23/2019 1541   LABSPEC 1.009  06/28/2019 [place] 7.0 06/28/2019 [place] 06/28/2019 1132   [place] 06/28/2019 [place] 06/28/2019 1132   BILIRUBINUR Negative 06/23/2019 1541   KETONESUR NEGATIVE 06/28/2019 1132   PROTEINUR NEGATIVE 06/28/2019 1132   NITRITE NEGATIVE 06/28/2019 1132   LEUKOCYTESUR NEGATIVE 06/28/2019 1132   Results for orders placed or performed during the hospital encounter of 06/27/19  SARS CORONAVIRUS 2 (TAT 6-24 HRS) Nasopharyngeal Nasopharyngeal Swab     Status: None   Collection Time: 06/27/19 10:31 AM   Specimen: Nasopharyngeal Swab  Result Value Ref Range Status   SARS Coronavirus 2 NEGATIVE NEGATIVE Final    Comment: (NOTE) SARS-CoV-2 target nucleic acids are NOT DETECTED. The SARS-CoV-2 RNA is generally detectable in upper and lower respiratory specimens during the acute phase of infection. Negative results do not preclude SARS-CoV-2 infection, do not rule out co-infections with other pathogens, and should not be used as the sole basis for treatment or other  patient management decisions. Negative results must be combined with clinical observations, patient history, and epidemiological information. The expected result is Negative. Fact Sheet for Patients: SugarRoll.be Fact Sheet for Healthcare Providers: https://www.woods-mathews.com/ This test is not yet approved or cleared by the [place] FDA and  has been authorized for detection and/or diagnosis of SARS-CoV-2 by FDA under an Emergency Use Authorization (EUA). This EUA will remain  in effect (meaning this test can be used) for the duration of the COVID-19 declaration under Section 56 4(b)(1) of the Act, 21 U.S.C. section 360bbb-3(b)(1), unless the authorization is terminated or revoked sooner. Performed at [facility], [place] [address], [place],  [address]    Assessment & Plan:  59 year old female with PMH medullary sponge kidney and recurrent nephrolithiasis presents with refractory RLQ pain in the setting of a known 3 mm right UVJ stone in advance of scheduled right ureteroscopy with laser lithotripsy and stent placement tomorrow.  Agree with admission overnight for pain control, BP control, and IV fluids.  We will continue with our plans for right URS/LL tomorrow.  Agree with cefazolin and pain management in the interim.  Please make her n.p.o. at midnight in advance of surgery.  Labs reassuring for acute urinary obstruction, no repeat imaging necessary.  Thank you for involving me in this patient's care, I will continue to follow along.  Debroah Loop, PA-C 06/28/2019 5:38 PM

## 2019-06-28 NOTE — Consult Note (Signed)
Pharmacy Antibiotic Note  Megan [name] is a 59 y.o. female admitted on 06/28/2019 with UTI.  Pharmacy has been consulted for Cefazolin dosing.  UCx from 5 days ago was pan-sensitive; patient was on keflex in the outpatient setting.   Plan: 1. Cefazolin 1g Q8H   Height: 5\' 4"  (162.6 cm) Weight: 195 lb (88.5 kg) IBW/kg (Calculated) : 54.7  Temp (24hrs), Avg:98.1 F (36.7 C), Min:98.1 F (36.7 C), Max:98.1 F (36.7 C)  Recent Labs  Lab 06/27/19 1031 06/28/19 1133  WBC 5.2 6.3  CREATININE 0.69 0.64    Estimated Creatinine Clearance: 81.5 mL/min (by C-G formula based on SCr of 0.64 mg/dL).    Allergies  Allergen Reactions  . Latex Other (See Comments) and Rash    Blisters  Blisters Blisters    . Morphine Anaphylaxis, Shortness Of Breath, Nausea And Vomiting, Swelling and Other (See Comments)    Ha's and facial swelling   . Nickel Swelling and Rash  . Petrolatum Rash and Other (See Comments)    Blister All petroleum products per patient, facial swelling   . Tape Rash    Pt can use paper tape  . Erythromycin Nausea Only and Nausea And Vomiting    Nausea/vomiting   . Erythromycin Ethylsuccinate Nausea And Vomiting  . Hydromorphone Hcl Other (See Comments)    Bad headache  . Other Other (See Comments)    Generic medications per patient- redness and burning in gums   . Sulfa Antibiotics Rash         Antimicrobials this admission:   Dose adjustments this admission:   Microbiology results:  Thank you for allowing pharmacy to be a part of this patient's care.  08/28/19 06/28/2019 4:16 PM

## 2019-06-28 NOTE — Progress Notes (Signed)
Patient presented to clinic today with reports of acute worsening of her right flank and RLQ pain x1 day.  She is scheduled for ureteroscopy with laser lithotripsy and stent placement with Dr. Apolinar Junes tomorrow for management of a 3 mm right UVJ stone as well as nonobstructing right nephrolithiasis.  In clinic, she was found to be hypertensive to 210/137 and tachycardic to 114.  She is visibly uncomfortable with flushed facies and tearful.  I offered her IM Toradol for pain management in clinic today versus proceeding to the emergency room.  She elected to proceed to the emergency room.  Repeat UA today reassuring for persistent UTI.  She has been on culture appropriate antibiotics for E. coli UTI since last week.  ED notified of impending patient arrival.

## 2019-06-29 ENCOUNTER — Encounter: Payer: Self-pay | Admitting: Family Medicine

## 2019-06-29 ENCOUNTER — Encounter
Admission: EM | Disposition: A | Discharge status: Home / Self Care | Payer: Self-pay | Source: Ambulatory Visit | Attending: Family Medicine

## 2019-06-29 ENCOUNTER — Ambulatory Visit: Admission: RE | Admit: 2019-06-29 | Payer: BC Managed Care – PPO | Source: Home / Self Care | Admitting: Urology

## 2019-06-29 ENCOUNTER — Observation Stay: Payer: BC Managed Care – PPO | Admitting: Registered Nurse

## 2019-06-29 ENCOUNTER — Observation Stay: Payer: BC Managed Care – PPO

## 2019-06-29 ENCOUNTER — Telehealth: Payer: Self-pay | Admitting: Urology

## 2019-06-29 DIAGNOSIS — J45909 Unspecified asthma, uncomplicated: Secondary | ICD-10-CM | POA: Diagnosis present

## 2019-06-29 DIAGNOSIS — Z881 Allergy status to other antibiotic agents status: Secondary | ICD-10-CM | POA: Diagnosis not present

## 2019-06-29 DIAGNOSIS — E039 Hypothyroidism, unspecified: Secondary | ICD-10-CM | POA: Diagnosis present

## 2019-06-29 DIAGNOSIS — Z87440 Personal history of urinary (tract) infections: Secondary | ICD-10-CM | POA: Diagnosis not present

## 2019-06-29 DIAGNOSIS — Z91048 Other nonmedicinal substance allergy status: Secondary | ICD-10-CM | POA: Diagnosis not present

## 2019-06-29 DIAGNOSIS — N390 Urinary tract infection, site not specified: Secondary | ICD-10-CM | POA: Diagnosis present

## 2019-06-29 DIAGNOSIS — R1030 Lower abdominal pain, unspecified: Secondary | ICD-10-CM | POA: Diagnosis not present

## 2019-06-29 DIAGNOSIS — N202 Calculus of kidney with calculus of ureter: Secondary | ICD-10-CM | POA: Diagnosis present

## 2019-06-29 DIAGNOSIS — Z87442 Personal history of urinary calculi: Secondary | ICD-10-CM | POA: Diagnosis not present

## 2019-06-29 DIAGNOSIS — E282 Polycystic ovarian syndrome: Secondary | ICD-10-CM | POA: Diagnosis present

## 2019-06-29 DIAGNOSIS — K219 Gastro-esophageal reflux disease without esophagitis: Secondary | ICD-10-CM | POA: Diagnosis present

## 2019-06-29 DIAGNOSIS — I10 Essential (primary) hypertension: Secondary | ICD-10-CM | POA: Diagnosis present

## 2019-06-29 DIAGNOSIS — Z91040 Latex allergy status: Secondary | ICD-10-CM | POA: Diagnosis not present

## 2019-06-29 DIAGNOSIS — N201 Calculus of ureter: Secondary | ICD-10-CM

## 2019-06-29 DIAGNOSIS — Z885 Allergy status to narcotic agent status: Secondary | ICD-10-CM | POA: Diagnosis not present

## 2019-06-29 DIAGNOSIS — Z7984 Long term (current) use of oral hypoglycemic drugs: Secondary | ICD-10-CM | POA: Diagnosis not present

## 2019-06-29 DIAGNOSIS — I160 Hypertensive urgency: Secondary | ICD-10-CM | POA: Diagnosis present

## 2019-06-29 DIAGNOSIS — Z20822 Contact with and (suspected) exposure to covid-19: Secondary | ICD-10-CM | POA: Diagnosis present

## 2019-06-29 DIAGNOSIS — Z882 Allergy status to sulfonamides status: Secondary | ICD-10-CM | POA: Diagnosis not present

## 2019-06-29 DIAGNOSIS — G500 Trigeminal neuralgia: Secondary | ICD-10-CM | POA: Diagnosis present

## 2019-06-29 DIAGNOSIS — F17200 Nicotine dependence, unspecified, uncomplicated: Secondary | ICD-10-CM | POA: Diagnosis present

## 2019-06-29 DIAGNOSIS — Q615 Medullary cystic kidney: Secondary | ICD-10-CM | POA: Diagnosis not present

## 2019-06-29 DIAGNOSIS — Z888 Allergy status to other drugs, medicaments and biological substances status: Secondary | ICD-10-CM | POA: Diagnosis not present

## 2019-06-29 DIAGNOSIS — E119 Type 2 diabetes mellitus without complications: Secondary | ICD-10-CM | POA: Diagnosis present

## 2019-06-29 DIAGNOSIS — B9620 Unspecified Escherichia coli [E. coli] as the cause of diseases classified elsewhere: Secondary | ICD-10-CM | POA: Diagnosis present

## 2019-06-29 DIAGNOSIS — Z780 Asymptomatic menopausal state: Secondary | ICD-10-CM | POA: Diagnosis not present

## 2019-06-29 HISTORY — PX: CYSTOSCOPY/URETEROSCOPY/HOLMIUM LASER/STENT PLACEMENT: SHX6546

## 2019-06-29 LAB — URINALYSIS, COMPLETE
Bilirubin, UA: NEGATIVE
Glucose, UA: NEGATIVE
Ketones, UA: NEGATIVE
Leukocytes,UA: NEGATIVE
Nitrite, UA: NEGATIVE
Protein,UA: NEGATIVE
RBC, UA: NEGATIVE
Specific Gravity, UA: 1.025 (ref 1.005–1.030)
Urobilinogen, Ur: 0.2 mg/dL (ref 0.2–1.0)
pH, UA: 6 (ref 5.0–7.5)

## 2019-06-29 LAB — COMPREHENSIVE METABOLIC PANEL
ALT: 19 U/L (ref 0–44)
AST: 18 U/L (ref 15–41)
Albumin: 3.7 g/dL (ref 3.5–5.0)
Alkaline Phosphatase: 56 U/L (ref 38–126)
Anion gap: 5 (ref 5–15)
BUN: 17 mg/dL (ref 6–20)
CO2: 29 mmol/L (ref 22–32)
Calcium: 8.6 mg/dL — ABNORMAL LOW (ref 8.9–10.3)
Chloride: 104 mmol/L (ref 98–111)
Creatinine, Ser: 0.61 mg/dL (ref 0.44–1.00)
GFR calc Af Amer: >60 mL/min (ref >60)
GFR calc non Af Amer: >60 mL/min (ref >60)
Glucose, Bld: 124 mg/dL — ABNORMAL HIGH (ref 70–99)
Potassium: 4 mmol/L (ref 3.5–5.1)
Sodium: 138 mmol/L (ref 135–145)
Total Bilirubin: 0.4 mg/dL (ref 0.3–1.2)
Total Protein: 6.2 g/dL — ABNORMAL LOW (ref 6.5–8.1)

## 2019-06-29 LAB — CBC
HCT: 40.1 % (ref 36.0–46.0)
Hemoglobin: 13.1 g/dL (ref 12.0–15.0)
MCH: 32.6 pg (ref 26.0–34.0)
MCHC: 32.7 g/dL (ref 30.0–36.0)
MCV: 99.8 fL (ref 80.0–100.0)
Platelets: 281 10*3/uL (ref 150–400)
RBC: 4.02 MIL/uL (ref 3.87–5.11)
RDW: 13.3 % (ref 11.5–15.5)
WBC: 7.6 10*3/uL (ref 4.0–10.5)
nRBC: 0 % (ref 0.0–0.2)

## 2019-06-29 LAB — PHOSPHORUS: Phosphorus: 2.6 mg/dL (ref 2.5–4.6)

## 2019-06-29 LAB — MAGNESIUM: Magnesium: 1.9 mg/dL (ref 1.7–2.4)

## 2019-06-29 LAB — MICROSCOPIC EXAMINATION: RBC, Urine: NONE SEEN /hpf (ref 0–2)

## 2019-06-29 LAB — GLUCOSE, CAPILLARY: Glucose-Capillary: 117 mg/dL — ABNORMAL HIGH (ref 70–99)

## 2019-06-29 LAB — HIV ANTIBODY (ROUTINE TESTING W REFLEX): HIV Screen 4th Generation wRfx: NONREACTIVE

## 2019-06-29 SURGERY — CYSTOSCOPY/URETEROSCOPY/HOLMIUM LASER/STENT PLACEMENT
Anesthesia: General | Site: Ureter | Laterality: Right

## 2019-06-29 MED ORDER — EPHEDRINE 5 MG/ML INJ
INTRAVENOUS | Status: AC
  Filled 2019-06-29: qty 10

## 2019-06-29 MED ORDER — SUCCINYLCHOLINE CHLORIDE 200 MG/10ML IV SOSY
PREFILLED_SYRINGE | INTRAVENOUS | Status: AC
  Filled 2019-06-29: qty 10

## 2019-06-29 MED ORDER — OXYCODONE HCL 5 MG PO TABS
ORAL_TABLET | ORAL | Status: AC
  Filled 2019-06-29: qty 1

## 2019-06-29 MED ORDER — FENTANYL CITRATE (PF) 100 MCG/2ML IJ SOLN
INTRAMUSCULAR | Status: DC | PRN
  Administered 2019-06-29: 50 ug via INTRAVENOUS

## 2019-06-29 MED ORDER — ACETAMINOPHEN 325 MG PO TABS: 325.0000 mg | ORAL_TABLET | ORAL | Status: DC | PRN

## 2019-06-29 MED ORDER — DEXAMETHASONE SODIUM PHOSPHATE 4 MG/ML IJ SOLN
INTRAMUSCULAR | Status: AC
  Filled 2019-06-29: qty 1

## 2019-06-29 MED ORDER — FENTANYL CITRATE (PF) 100 MCG/2ML IJ SOLN
INTRAMUSCULAR | Status: AC
  Filled 2019-06-29: qty 2

## 2019-06-29 MED ORDER — OXYCODONE HCL 5 MG PO TABS
5.0000 mg | ORAL_TABLET | ORAL | Status: DC | PRN
  Administered 2019-06-29 – 2019-06-30 (×2): 5 mg via ORAL
  Filled 2019-06-29 (×2): qty 1

## 2019-06-29 MED ORDER — ROCURONIUM BROMIDE 10 MG/ML (PF) SYRINGE
PREFILLED_SYRINGE | INTRAVENOUS | Status: AC
  Filled 2019-06-29: qty 10

## 2019-06-29 MED ORDER — KETAMINE HCL 10 MG/ML IJ SOLN
INTRAMUSCULAR | Status: DC | PRN
  Administered 2019-06-29: 20 mg via INTRAVENOUS

## 2019-06-29 MED ORDER — SUGAMMADEX SODIUM 200 MG/2ML IV SOLN
INTRAVENOUS | Status: DC | PRN
  Administered 2019-06-29: 200 mg via INTRAVENOUS

## 2019-06-29 MED ORDER — ONDANSETRON HCL 4 MG/2ML IJ SOLN
INTRAMUSCULAR | Status: AC
  Filled 2019-06-29: qty 2

## 2019-06-29 MED ORDER — OXYBUTYNIN CHLORIDE 5 MG PO TABS: 5.0000 mg | ORAL_TABLET | Freq: Three times a day (TID) | ORAL | 0 refills | Status: DC | PRN

## 2019-06-29 MED ORDER — CEFAZOLIN SODIUM-DEXTROSE 1-4 GM/50ML-% IV SOLN
INTRAVENOUS | Status: AC
  Filled 2019-06-29: qty 50

## 2019-06-29 MED ORDER — ALBUTEROL SULFATE HFA 108 (90 BASE) MCG/ACT IN AERS
INHALATION_SPRAY | RESPIRATORY_TRACT | Status: DC | PRN
  Administered 2019-06-29: 4 via RESPIRATORY_TRACT

## 2019-06-29 MED ORDER — BELLADONNA ALKALOIDS-OPIUM 16.2-60 MG RE SUPP
RECTAL | Status: DC | PRN
  Administered 2019-06-29: 1 via RECTAL

## 2019-06-29 MED ORDER — ONDANSETRON HCL 4 MG/2ML IJ SOLN
INTRAMUSCULAR | Status: AC
  Administered 2019-06-29: 4 mg via INTRAVENOUS
  Filled 2019-06-29: qty 2

## 2019-06-29 MED ORDER — FENTANYL CITRATE (PF) 100 MCG/2ML IJ SOLN: 50.0000 ug | INTRAMUSCULAR | Status: DC | PRN

## 2019-06-29 MED ORDER — ONDANSETRON HCL 4 MG/2ML IJ SOLN: 4.0000 mg | Freq: Once | INTRAMUSCULAR | Status: AC

## 2019-06-29 MED ORDER — SODIUM CHLORIDE (PF) 0.9 % IJ SOLN
INTRAMUSCULAR | Status: AC
  Filled 2019-06-29: qty 10

## 2019-06-29 MED ORDER — KETOROLAC TROMETHAMINE 30 MG/ML IJ SOLN
INTRAMUSCULAR | Status: AC
  Filled 2019-06-29: qty 1

## 2019-06-29 MED ORDER — ONDANSETRON HCL 4 MG/2ML IJ SOLN
INTRAMUSCULAR | Status: DC | PRN
  Administered 2019-06-29: 4 mg via INTRAVENOUS

## 2019-06-29 MED ORDER — DEXAMETHASONE SODIUM PHOSPHATE 10 MG/ML IJ SOLN
INTRAMUSCULAR | Status: DC | PRN
  Administered 2019-06-29 (×2): 4 mg via INTRAVENOUS

## 2019-06-29 MED ORDER — FENTANYL CITRATE (PF) 100 MCG/2ML IJ SOLN
50.0000 ug | Freq: Once | INTRAMUSCULAR | Status: AC
  Administered 2019-06-29: 50 ug via INTRAVENOUS

## 2019-06-29 MED ORDER — BELLADONNA ALKALOIDS-OPIUM 16.2-60 MG RE SUPP
RECTAL | Status: AC
  Filled 2019-06-29: qty 1

## 2019-06-29 MED ORDER — IOPAMIDOL (ISOVUE-200) INJECTION 41%
INTRAVENOUS | Status: DC | PRN
  Administered 2019-06-29: 20 mL via INTRAVENOUS

## 2019-06-29 MED ORDER — PROPOFOL 10 MG/ML IV BOLUS
INTRAVENOUS | Status: DC | PRN
  Administered 2019-06-29: 150 mg via INTRAVENOUS

## 2019-06-29 MED ORDER — ROCURONIUM BROMIDE 100 MG/10ML IV SOLN
INTRAVENOUS | Status: DC | PRN
  Administered 2019-06-29: 20 mg via INTRAVENOUS
  Administered 2019-06-29: 30 mg via INTRAVENOUS

## 2019-06-29 MED ORDER — OXYCODONE HCL 5 MG PO TABS
5.0000 mg | ORAL_TABLET | Freq: Once | ORAL | Status: AC
  Administered 2019-06-29: 5 mg via ORAL

## 2019-06-29 MED ORDER — KETOROLAC TROMETHAMINE 30 MG/ML IJ SOLN
INTRAMUSCULAR | Status: DC | PRN
  Administered 2019-06-29: 30 mg via INTRAVENOUS

## 2019-06-29 MED ORDER — SUCCINYLCHOLINE CHLORIDE 20 MG/ML IJ SOLN
INTRAMUSCULAR | Status: DC | PRN
  Administered 2019-06-29: 120 mg via INTRAVENOUS

## 2019-06-29 MED ORDER — LIDOCAINE HCL (CARDIAC) PF 100 MG/5ML IV SOSY
PREFILLED_SYRINGE | INTRAVENOUS | Status: DC | PRN
  Administered 2019-06-29: 100 mg via INTRAVENOUS

## 2019-06-29 MED ORDER — ALBUTEROL SULFATE HFA 108 (90 BASE) MCG/ACT IN AERS
INHALATION_SPRAY | RESPIRATORY_TRACT | Status: AC
  Filled 2019-06-29: qty 6.7

## 2019-06-29 MED ORDER — MIDAZOLAM HCL 2 MG/2ML IJ SOLN
INTRAMUSCULAR | Status: AC
  Filled 2019-06-29: qty 2

## 2019-06-29 MED ORDER — ACETAMINOPHEN 160 MG/5ML PO SOLN: 325.0000 mg | ORAL | Status: DC | PRN

## 2019-06-29 MED ORDER — KETOROLAC TROMETHAMINE 30 MG/ML IJ SOLN: 30.0000 mg | Freq: Once | INTRAMUSCULAR | Status: DC | PRN

## 2019-06-29 MED ORDER — MIDAZOLAM HCL 2 MG/2ML IJ SOLN
INTRAMUSCULAR | Status: DC | PRN
  Administered 2019-06-29: 2 mg via INTRAVENOUS

## 2019-06-29 MED ORDER — FENTANYL CITRATE (PF) 100 MCG/2ML IJ SOLN
INTRAMUSCULAR | Status: AC
  Administered 2019-06-29: 50 ug via INTRAVENOUS
  Filled 2019-06-29: qty 2

## 2019-06-29 MED ORDER — PROMETHAZINE HCL 25 MG/ML IJ SOLN
12.5000 mg | Freq: Four times a day (QID) | INTRAMUSCULAR | Status: DC | PRN
  Administered 2019-06-29: 17:00:00 12.5 mg via INTRAVENOUS
  Filled 2019-06-29: qty 1

## 2019-06-29 MED ORDER — LIDOCAINE HCL (PF) 2 % IJ SOLN
INTRAMUSCULAR | Status: AC
  Filled 2019-06-29: qty 10

## 2019-06-29 MED ORDER — TAMSULOSIN HCL 0.4 MG PO CAPS: 0.4000 mg | ORAL_CAPSULE | Freq: Every day | ORAL | 0 refills | Status: DC

## 2019-06-29 MED ORDER — FENTANYL CITRATE (PF) 100 MCG/2ML IJ SOLN
25.0000 ug | INTRAMUSCULAR | Status: DC | PRN
  Administered 2019-06-29 (×2): 50 ug via INTRAVENOUS

## 2019-06-29 MED ORDER — PROMETHAZINE HCL 25 MG/ML IJ SOLN: 6.2500 mg | INTRAMUSCULAR | Status: DC | PRN

## 2019-06-29 MED ORDER — PROPOFOL 10 MG/ML IV BOLUS
INTRAVENOUS | Status: AC
  Filled 2019-06-29: qty 20

## 2019-06-29 SURGICAL SUPPLY — 26 items
ADHESIVE MASTISOL STRL (MISCELLANEOUS) ×1 IMPLANT
BAG DRAIN CYSTO-URO LG1000N (MISCELLANEOUS) ×2 IMPLANT
BASKET ZERO TIP 1.9FR (BASKET) IMPLANT
BRUSH SCRUB EZ 1% IODOPHOR (MISCELLANEOUS) ×2 IMPLANT
CATH URETL 5X70 OPEN END (CATHETERS) ×2 IMPLANT
CNTNR SPEC 2.5X3XGRAD LEK (MISCELLANEOUS)
CONT SPEC 4OZ STER OR WHT (MISCELLANEOUS)
CONTAINER SPEC 2.5X3XGRAD LEK (MISCELLANEOUS) IMPLANT
DRAPE UTILITY 15X26 TOWEL STRL (DRAPES) ×2 IMPLANT
DRSG TEGADERM 2-3/8X2-3/4 SM (GAUZE/BANDAGES/DRESSINGS) ×1 IMPLANT
FIBER LASER TRACTIP 200 (UROLOGICAL SUPPLIES) ×3 IMPLANT
GLOVE BIO SURGEON STRL SZ 6.5 (GLOVE) ×2 IMPLANT
GOWN STRL REUS W/ TWL LRG LVL3 (GOWN DISPOSABLE) ×2 IMPLANT
GOWN STRL REUS W/TWL LRG LVL3 (GOWN DISPOSABLE) ×2
GUIDEWIRE GREEN .038 145CM (MISCELLANEOUS) ×1 IMPLANT
GUIDEWIRE STR DUAL SENSOR (WIRE) ×2 IMPLANT
INFUSOR MANOMETER BAG 3000ML (MISCELLANEOUS) ×2 IMPLANT
INTRODUCER DILATOR DOUBLE (INTRODUCER) IMPLANT
KIT TURNOVER CYSTO (KITS) ×2 IMPLANT
PACK CYSTO AR (MISCELLANEOUS) ×2 IMPLANT
SET CYSTO W/LG BORE CLAMP LF (SET/KITS/TRAYS/PACK) ×2 IMPLANT
SHEATH URETERAL 12FRX35CM (MISCELLANEOUS) IMPLANT
SOL .9 NS 3000ML IRR  AL (IV SOLUTION) ×1
SOL .9 NS 3000ML IRR UROMATIC (IV SOLUTION) ×1 IMPLANT
SURGILUBE 2OZ TUBE FLIPTOP (MISCELLANEOUS) ×2 IMPLANT
WATER STERILE IRR 1000ML POUR (IV SOLUTION) ×2 IMPLANT

## 2019-06-29 NOTE — Discharge Instructions (Signed)
You have a ureteral stent in place.  This is a tube that extends from your kidney to your bladder.  This may cause urinary bleeding, burning with urination, and urinary frequency.  Please call our office or present to the ED if you develop fevers >101 or pain which is not able to be controlled with oral pain medications.  You may be given either Flomax and/ or ditropan to help with bladder spasms and stent pain in addition to pain medications.    Your stent is on a string.  It is taped to your left inner thigh.  You may remove this on Friday by gently on taping and pulling gently until the entire stent is removed.  [facility] [address] [place], [place] [address] [phone]

## 2019-06-29 NOTE — Transfer of Care (Signed)
Immediate Anesthesia Transfer of Care Note  Patient: Megan [name]  Procedure(s) Performed: CYSTOSCOPY/URETEROSCOPY/HOLMIUM LASER/STENT PLACEMENT (Right Ureter)  Patient Location: PACU  Anesthesia Type:General  Level of Consciousness: drowsy  Airway & Oxygen Therapy: Patient Spontanous Breathing and Patient connected to face mask oxygen  Post-op Assessment: Report given to RN and Post -op Vital signs reviewed and stable  Post vital signs: Reviewed and stable  Last Vitals:  Vitals Value Taken Time  BP 172/94 06/29/19 1331  Temp    Pulse 87 06/29/19 1334  Resp 16 06/29/19 1334  SpO2 100 % 06/29/19 1334  Vitals shown include unvalidated device data.  Last Pain:  Vitals:   06/29/19 1152  TempSrc: Temporal  PainSc:       Patients Stated Pain Goal: 0 (06/29/19 0850)  Complications: No apparent anesthesia complications

## 2019-06-29 NOTE — Anesthesia Preprocedure Evaluation (Signed)
Anesthesia Evaluation  Patient identified by MRN, date of birth, ID band Patient awake    Reviewed: Allergy & Precautions, H&P , NPO status , reviewed documented beta blocker date and time   Airway Mallampati: II  TM Distance: >3 FB Neck ROM: full    Dental  (+) Chipped, Caps   Pulmonary asthma , Current Smoker and Patient abstained from smoking.,    Pulmonary exam normal        Cardiovascular hypertension, Normal cardiovascular exam     Neuro/Psych    GI/Hepatic GERD  ,Some N&V preop   Endo/Other  diabetes  Renal/GU Renal disease     Musculoskeletal   Abdominal   Peds  Hematology   Anesthesia Other Findings Past Medical History: No date: Asthma No date: Diabetes mellitus without complication (HCC) No date: Hypertension No date: Thyroid disease Past Surgical History: No date: ABDOMINAL HYSTERECTOMY No date: APPENDECTOMY No date: FEMORAL OSTEOTOMY W/ APPLICATION EXTERNAL FIXATOR; Right No date: ROTATOR CUFF REPAIR BMI    Body Mass Index: 33.47 kg/m     Reproductive/Obstetrics                             Anesthesia Physical Anesthesia Plan  ASA: III  Anesthesia Plan: General   Post-op Pain Management:    Induction: Intravenous and Rapid sequence  PONV Risk Score and Plan: 3 and Ondansetron, Dexamethasone, Midazolam and Treatment may vary due to age or medical condition  Airway Management Planned: Oral ETT  Additional Equipment:   Intra-op Plan:   Post-operative Plan: Extubation in OR  Informed Consent: I have reviewed the patients History and Physical, chart, labs and discussed the procedure including the risks, benefits and alternatives for the proposed anesthesia with the patient or authorized representative who has indicated his/her understanding and acceptance.     Dental Advisory Given  Plan Discussed with: CRNA  Anesthesia Plan Comments:          Anesthesia Quick Evaluation

## 2019-06-29 NOTE — Op Note (Signed)
Date of procedure: 06/29/19  Preoperative diagnosis:  1. Right distal ureteral calculus 2. Right nonobstructing stones    Postoperative diagnosis:  1. Same as above  Procedure: 1. Right ureteroscopy with laser lithotripsy 2. Right retrograde pyelogram 3. Right ureteral stent placement  Surgeon: Hollice Espy, MD  Anesthesia: General  Complications: None  Intraoperative findings: Unremarkable retrograde pyelogram without evidence of hydroureteronephrosis.  Very small stone identified within distal ureter, dislodged with endoscopic manipulation.  Nonobstructing upper tract stones also treated including a few Randall's plaques.  Uncomplicated.  Stent placed on tether.  EBL: Minimal  Specimens: None, stones dusted  Drains: 6 x 22 French double-J ureteral stent on right with tether  Indication: Megan [name] is a 59 y.o. patient with personal history of recurrent nephrolithiasis presenting with an obstructing small right distal ureteral calculus and nonobstructing stones.  She is adequately treated for urinary tract infection.  She ended up getting admitted yesterday for poorly controlled pain and hypertension which have been sufficiently controlled overnight..  After reviewing the management options for treatment, she elected to proceed with the above surgical procedure(s). We have discussed the potential benefits and risks of the procedure, side effects of the proposed treatment, the likelihood of the patient achieving the goals of the procedure, and any potential problems that might occur during the procedure or recuperation. Informed consent has been obtained.  Description of procedure:  The patient was taken to the operating room and general anesthesia was induced.  The patient was placed in the dorsal lithotomy position, prepped and draped in the usual sterile fashion, and preoperative antibiotics were administered. A preoperative time-out was performed.   A 21 French scope was  advanced per urethra into the bladder.  Attention was turned to the right ureteral orifice which was cannulated using a 5 [place] open-ended ureteral catheter just within the UO.  Gentle retrograde pyelogram was performed on the side which revealed no hydronephrosis or filling defects.  A sensor wire was then placed up to level the kidney without difficulty.  This was snapped in place as a safety wire.  A semirigid ureteroscope was then brought in and introduced just within the UO where a relatively small stone was encountered.  I pushed the scope beyond the stone up to the level of the proximal ureter without difficulty and no additional stones or stone fragments were identified.  Upon backing the scope out, the stone was noted to be into smaller pieces and with some very subtle manipulation of the distal ureter and maneuvering the scope in and out the UO, is able to in fact remove the entire stone burden with simple endoscopic maneuvering.  The scope was then reintroduced although up to the proximal ureter again without difficulty.  A Super Stiff wire was then placed under guidance.  Upon backing the scope out, no additional stones or stone fragments were identified no ureteral injuries were appreciated.  A 7 French digital flexible ureteroscope was then advanced over the Super Stiff wire into the collecting system without difficulty.  This was done under fluoroscopic guidance.  The wire was withdrawn.  The entire renal unit was then directly visualized.  A few relatively small up to 3 mm stones were identified and the majority of the calyces including a few small adherent Randall's plaques.  A 200 m laser fiber was then brought in and using settings of 0.2 J and 40 Hz, these very small stones were dusted into fragments and no larger than the tip of the laser  fiber.  Each every calyx was directly visualized.  A final retrograde pyelogram showed no extravasation or injury and outline the collecting system such that  each and every calyx was directly visualized.  The scope was then backed in length the ureter inspecting along the way.  There was no ureteral injury appreciated whatsoever with very minimal trauma.  Finally, a 6 x 22 French double-J ureteral stent was advanced over the wire up to the level of the kidney appears partially drawn till full coils noted both within the renal pelvis as well as within the bladder.  The bladder was then drained.  The stent string was affixed to the patient's left inner thigh using muscle and Tegaderm.  She was then cleaned and dried.  A B&O suppository was applied.  She was repositioned in supine position, reversed myesthesia, taken the PACU in stable condition.  Plan: There were no complications in this case.  Is on is her pain is controlled, she is appropriate for discharge from a urologic perspective.  These recommendations were communicated to the admitting doctor, Dr. Lowell Guitar.  We will arrange for outpatient follow-up.  She may remove her own stent as early as tomorrow.  Vanna Scotland, M.D.

## 2019-06-29 NOTE — Anesthesia Procedure Notes (Addendum)
Procedure Name: Intubation Date/Time: 06/29/2019 12:45 PM Performed by: Irving Burton, CRNA Pre-anesthesia Checklist: Patient identified, Emergency Drugs available, Suction available and Patient being monitored Patient Re-evaluated:Patient Re-evaluated prior to induction Oxygen Delivery Method: Circle system utilized Preoxygenation: Pre-oxygenation with 100% oxygen Induction Type: IV induction, Rapid sequence and Cricoid Pressure applied Laryngoscope Size: McGraph and 3 Grade View: Grade I Tube type: Oral Tube size: 7.0 mm Number of attempts: 1 Airway Equipment and Method: Stylet and Video-laryngoscopy Placement Confirmation: ETT inserted through vocal cords under direct vision,  positive ETCO2 and breath sounds checked- equal and bilateral Secured at: 21 cm Tube secured with: Tape Dental Injury: Teeth and Oropharynx as per pre-operative assessment

## 2019-06-29 NOTE — Telephone Encounter (Signed)
Please ensure this patient has outpatient follow-up in 4 weeks with Carman Ching with renal ultrasound prior.  Order placed.  Vanna Scotland, MD

## 2019-06-29 NOTE — Interval H&P Note (Signed)
History and Physical Interval Note:  06/29/2019 12:18 PM  Megan [name]  has presented today for surgery, with the diagnosis of right UVJ stone, multiple right renal stones.  The various methods of treatment have been discussed with the patient and family. After consideration of risks, benefits and other options for treatment, the patient has consented to  Procedure(s): CYSTOSCOPY/URETEROSCOPY/HOLMIUM LASER/STENT PLACEMENT (Right) as a surgical intervention.  The patient's history has been reviewed, patient examined, no change in status, stable for surgery.  I have reviewed the patient's chart and labs.  Questions were answered to the patient's satisfaction.    RRR CTAB  Vanna Scotland

## 2019-06-29 NOTE — Progress Notes (Addendum)
PROGRESS NOTE    Megan [name]:[id number] DOB: [birth year] DOA: 06/28/2019 PCP: Leonel Ramsay, MD   Brief Narrative:  Megan [name] is Megan [name] 59 y.o. female with medical history significant of medullary sponge kidney, recurrent nephrolithiasis, recurrent UTI's, hypothyroidism, asthma, PCOS, and multiple other medical problems presenting with abdominal pain.  She notes feeling run down for about 3 weeks.  Last Monday (9 days ago) she started noticing urgency, dysuria, typical for UTI.  She noted progressive sx with backache, nausea.  She noted progressive nausea, dysuria, and incontinence as well.  She saw urology on 3/5 and was started on abx.  She also had imaging done.  Urology was planning for ureteroscopy with laser lithotripsy and stent placement, but her pain progressively worsened.  She was seen in urology clinic today with progressive flank pain.  Due to her progressive pain, she presented to the emergency room.  Denies smoking, etoh.  +chills, no fevers.  No CP, no SOB. + nausea, vomiting.  No hematuria.  Assessment & Plan:   Active Problems:   Nephrolithiasis  E. Coli UTI  Nephrolithiasis  Hx Medullary Sponge Kidney:  Dysuria and typical UTI sx started about 1 week ago followed by sharp lower abdominal pain radiating to groin on R similar to prior stones. CT stone study with bilateral nonobstructive nephrolithiasis and 3 mm distal R ureteral calculus near the UVJ without significant ureteral dilatation or hydronephrosis 3/5 urine cx with pansensitive e. Coli - ancef UA today without nitrite/LE, but many bacteria, will continue IV abx, follow repeat cx Toradol, fentanyl, oxycodone for pain (morphine allergy noted, discussed with pharmacy, has tolerated both fentanyl and oxycodone in the past) S/p R ureteroscopy with laser lithotripsy, R retrograde pyelogram, R ureteral stent placement on 3/11 Zofran for nausea Flomax  Hypertensive Urgency: continue amlodipine, HCTZ.  Suspect  her significantly elevated BP's are related to pain related to above.  Treat pain.  Labetalol prn SBP >180.  BP improved overnight, now back up again Amlodipine, HCTZ If persistently elevated BP, will need to add additional PO meds  Trigeminal Neuralgia: tegretol  Asthma: albuterol, dulera, singulair  Hypothyroidism: synthroid  Post Menopausal: continue HRT  GERD: continue PPI  DVT prophylaxis: SCD Code Status: full Family Communication: husband at bedside Disposition Plan:  . Patient came from: home            . Anticipated d/c place: home . Barriers to d/c OR conditions which need to be met to effect [name] safe d/c: pending improved pain control  Consultants:   urology  Procedures:  S/p R ureteroscopy with laser lithotripsy, R retrograde pyelogram, R ureteral stent placement on 3/11  Antimicrobials:  Anti-infectives (From admission, onward)   Start     Dose/Rate Route Frequency Ordered Stop   06/29/19 1201  ceFAZolin (ANCEF) 1-4 GM/50ML-% IVPB    Note to Pharmacy: Leonia Reader   : cabinet override      06/29/19 1201 06/29/19 1251   06/29/19 1156  ceFAZolin (ANCEF) 1-4 GM/50ML-% IVPB    Note to Pharmacy: Leonia Reader   : cabinet override      06/29/19 1156 06/29/19 2359   06/28/19 1630  ceFAZolin (ANCEF) IVPB 1 g/50 mL premix     1 g 100 mL/hr over 30 Minutes Intravenous Every 8 hours 06/28/19 1618       Subjective: C/o pain after stent placement, shooting pain  Objective: Vitals:   06/29/19 1415 06/29/19 1430 06/29/19 1452 06/29/19 1529  BP: (!) 157/90 (!) 169/84 Marland Kitchen)  162/84 (!) 169/76  Pulse: 77 84 71 83  Resp: _0 Temp:  99.5 F (37.5 C)  97.9 F (36.6 C)  TempSrc:    Oral  SpO2: 93% 100% 98%   Weight:      Height:        Intake/Output Summary (Last 24 hours) at 06/29/2019 1611 Last data filed at 06/29/2019 1400 Gross per 24 hour  Intake 725.35 ml  Output 0 ml  Net 725.35 ml   Filed Weights   06/28/19 1130  Weight: 88.5 kg     Examination:  General exam: Appears calm and comfortable  Respiratory system: Clear to auscultation. Respiratory effort normal. Cardiovascular system: S1 & S2 heard, RRR.  Gastrointestinal system: Abdomen is nondistended, soft and R>LLQ tenderness to palpation.  Central nervous system: Alert and oriented. No focal neurological deficits. Extremities: no LEE Skin: No rashes, lesions or ulcers Psychiatry: Judgement and insight appear normal. Mood & affect appropriate.     Data Reviewed: I have personally reviewed following labs and imaging studies  CBC: Recent Labs  Lab 06/27/19 1031 06/28/19 1133 06/29/19 0511  WBC 5.2 6.3 7.6  HGB 14.1 14.5 13.1  HCT 42.6 43.7 40.1  MCV 97.3 98.0 99.8  PLT 304 317 742   Basic Metabolic Panel: Recent Labs  Lab 06/27/19 1031 06/28/19 1133 06/29/19 0511  NA 139 137 138  K 4.0 4.0 4.0  CL 105 103 104  CO2 _1 GLUCOSE 118* 103* 124*  BUN _2 CREATININE 0.69 0.64 0.61  CALCIUM 9.3 9.3 8.6*  MG  --   --  1.9  PHOS  --   --  2.6   GFR: Estimated Creatinine Clearance: 81.5 mL/min (by C-G formula based on SCr of 0.61 mg/dL). Liver Function Tests: Recent Labs  Lab 06/28/19 1133 06/29/19 0511  AST 22 18  ALT 23 19  ALKPHOS 68 56  BILITOT 0.7 0.4  PROT 7.4 6.2*  ALBUMIN 4.3 3.7   Recent Labs  Lab 06/28/19 1133  LIPASE 31   No results for input(s): AMMONIA in the last 168 hours. Coagulation Profile: No results for input(s): INR, PROTIME in the last 168 hours. Cardiac Enzymes: No results for input(s): CKTOTAL, CKMB, CKMBINDEX, TROPONINI in the last 168 hours. BNP (last 3 results) No results for input(s): PROBNP in the last 8760 hours. HbA1C: Recent Labs    06/28/19 1133  HGBA1C 5.5   CBG: Recent Labs  Lab 06/29/19 1036  GLUCAP 117*   Lipid Profile: No results for input(s): CHOL, HDL, LDLCALC, TRIG, CHOLHDL, LDLDIRECT in the last 72 hours. Thyroid Function Tests: No results for input(s): TSH,  T4TOTAL, FREET4, T3FREE, THYROIDAB in the last 72 hours. Anemia Panel: No results for input(s): VITAMINB12, FOLATE, FERRITIN, TIBC, IRON, RETICCTPCT in the last 72 hours. Sepsis Labs: No results for input(s): PROCALCITON, LATICACIDVEN in the last 168 hours.  Recent Results (from the past 240 hour(s))  Microscopic Examination     Status: Abnormal   Collection Time: 06/23/19  3:41 PM   URINE  Result Value Ref Range Status   WBC, UA 11-30 (A) 0 - 5 /hpf Final   RBC 3-10 (A) 0 - 2 /hpf Final   Epithelial Cells (non renal) >10 (A) 0 - 10 /hpf Final   Renal Epithel, UA 0-10 (A) None seen /hpf Final   Bacteria, UA Moderate (A) None seen/Few Final  CULTURE, URINE COMPREHENSIVE     Status: Abnormal   Collection Time:  06/23/19  4:19 PM   Specimen: Urine   URINE  Result Value Ref Range Status   Urine Culture, Comprehensive Final report (A)  Final   Organism ID, Bacteria Escherichia coli (A)  Final    Comment: Greater than 100,000 colony forming units per mL Cefazolin <=4 ug/mL Cefazolin with an MIC <=16 predicts susceptibility to the oral agents cefaclor, cefdinir, cefpodoxime, cefprozil, cefuroxime, cephalexin, and loracarbef when used for therapy of uncomplicated urinary tract infections due to E. coli, Klebsiella pneumoniae, and Proteus mirabilis.    ANTIMICROBIAL SUSCEPTIBILITY Comment  Final    Comment:       ** S = Susceptible; I = Intermediate; R = Resistant **                    P = Positive; N = Negative             MICS are expressed in micrograms per mL    Antibiotic                 RSLT#1    RSLT#2    RSLT#3    RSLT#4 Amoxicillin/Clavulanic Acid    S Ampicillin                     S Cefepime                       S Ceftriaxone                    S Cefuroxime                     S Ciprofloxacin                  S Ertapenem                      S Gentamicin                     S Imipenem                       S Levofloxacin                   S Meropenem                       S Nitrofurantoin                 S Piperacillin/Tazobactam        S Tetracycline                   S Tobramycin                     S Trimethoprim/Sulfa             S   SARS CORONAVIRUS 2 (TAT 6-24 HRS) Nasopharyngeal Nasopharyngeal Swab     Status: None   Collection Time: 06/27/19 10:31 AM   Specimen: Nasopharyngeal Swab  Result Value Ref Range Status   SARS Coronavirus 2 NEGATIVE NEGATIVE Final    Comment: (NOTE) SARS-CoV-2 target nucleic acids are NOT DETECTED. The SARS-CoV-2 RNA is generally detectable in upper and lower respiratory specimens during the acute phase of infection. Negative results do not preclude SARS-CoV-2 infection, do not rule out co-infections with other pathogens, and should not be used as the  sole basis for treatment or other patient management decisions. Negative results must be combined with clinical observations, patient history, and epidemiological information. The expected result is Negative. Fact Sheet for Patients: SugarRoll.be Fact Sheet for Healthcare Providers: https://www.woods-mathews.com/ This test is not yet approved or cleared by the [place] FDA and  has been authorized for detection and/or diagnosis of SARS-CoV-2 by FDA under an Emergency Use Authorization (EUA). This EUA will remain  in effect (meaning this test can be used) for the duration of the COVID-19 declaration under Section 56 4(b)(1) of the Act, 21 U.S.C. section 360bbb-3(b)(1), unless the authorization is terminated or revoked sooner. Performed at [facility], [place] [address] [place], [place] [address]   Microscopic Examination     Status: Abnormal   Collection Time: 06/28/19 11:45 AM   URINE  Result Value Ref Range Status   WBC, UA 0-5 0 - 5 /hpf Final   RBC None seen 0 - 2 /hpf Final   Epithelial Cells (non renal) 0-10 0 - 10 /hpf Final   Bacteria, UA Moderate (Sterlin Knightly) None seen/Few Final         Radiology  Studies: DG OR UROLOGY CYSTO IMAGE ([facility] ONLY)  Result Date: 06/29/2019 There is no interpretation for this exam.  This order is for images obtained during Aster Eckrich surgical procedure.  Please See "Surgeries" Tab for more information regarding the procedure.        Scheduled Meds: . amLODipine  10 mg Oral Daily  . carbamazepine  200 mg Oral QHS  . cholecalciferol  3,000 Units Oral Daily  . estradiol  0.05 mg Transdermal Q72H  . hydrochlorothiazide  25 mg Oral Daily  . levothyroxine  50 mcg Oral QAC breakfast  . mometasone-formoterol  2 puff Inhalation BID  . montelukast  10 mg Oral Daily  . ondansetron      . pantoprazole  40 mg Oral BID  . tamsulosin  0.4 mg Oral Daily   Continuous Infusions: . ceFAZolin    .  ceFAZolin (ANCEF) IV 0 mL/hr at 06/29/19 0329     LOS: 0 days    Time spent: over 30 min    Fayrene Helper, MD [facility]   To contact the attending provider between 7A-7P or the covering provider during after hours 7P-7A, please log into the web site [email or web address] and access using universal Justice password for that web site. If you do not have the password, please call the hospital operator.  06/29/2019, 4:11 PM

## 2019-06-30 ENCOUNTER — Other Ambulatory Visit: Payer: BC Managed Care – PPO

## 2019-06-30 DIAGNOSIS — N201 Calculus of ureter: Secondary | ICD-10-CM

## 2019-06-30 LAB — COMPREHENSIVE METABOLIC PANEL
ALT: 16 U/L (ref 0–44)
AST: 19 U/L (ref 15–41)
Albumin: 3.6 g/dL (ref 3.5–5.0)
Alkaline Phosphatase: 54 U/L (ref 38–126)
Anion gap: 7 (ref 5–15)
BUN: 19 mg/dL (ref 6–20)
CO2: 24 mmol/L (ref 22–32)
Calcium: 8.6 mg/dL — ABNORMAL LOW (ref 8.9–10.3)
Chloride: 107 mmol/L (ref 98–111)
Creatinine, Ser: 0.71 mg/dL (ref 0.44–1.00)
GFR calc Af Amer: >60 mL/min (ref >60)
GFR calc non Af Amer: >60 mL/min (ref >60)
Glucose, Bld: 116 mg/dL — ABNORMAL HIGH (ref 70–99)
Potassium: 3.4 mmol/L — ABNORMAL LOW (ref 3.5–5.1)
Sodium: 138 mmol/L (ref 135–145)
Total Bilirubin: 0.4 mg/dL (ref 0.3–1.2)
Total Protein: 6.2 g/dL — ABNORMAL LOW (ref 6.5–8.1)

## 2019-06-30 LAB — CBC WITH DIFFERENTIAL/PLATELET
Abs Immature Granulocytes: 0.05 10*3/uL (ref 0.00–0.07)
Basophils Absolute: 0 10*3/uL (ref 0.0–0.1)
Basophils Relative: 0 %
Eosinophils Absolute: 0.1 10*3/uL (ref 0.0–0.5)
Eosinophils Relative: 1 %
HCT: 40.3 % (ref 36.0–46.0)
Hemoglobin: 13.1 g/dL (ref 12.0–15.0)
Immature Granulocytes: 1 %
Lymphocytes Relative: 20 %
Lymphs Abs: 1.9 10*3/uL (ref 0.7–4.0)
MCH: 32.2 pg (ref 26.0–34.0)
MCHC: 32.5 g/dL (ref 30.0–36.0)
MCV: 99 fL (ref 80.0–100.0)
Monocytes Absolute: 1 10*3/uL (ref 0.1–1.0)
Monocytes Relative: 11 %
Neutro Abs: 6.4 10*3/uL (ref 1.7–7.7)
Neutrophils Relative %: 67 %
Platelets: 297 10*3/uL (ref 150–400)
RBC: 4.07 MIL/uL (ref 3.87–5.11)
RDW: 13.7 % (ref 11.5–15.5)
WBC: 9.5 10*3/uL (ref 4.0–10.5)
nRBC: 0 % (ref 0.0–0.2)

## 2019-06-30 LAB — URINE CULTURE: Culture: NO GROWTH

## 2019-06-30 LAB — MAGNESIUM: Magnesium: 2.3 mg/dL (ref 1.7–2.4)

## 2019-06-30 LAB — PHOSPHORUS: Phosphorus: 1.8 mg/dL — ABNORMAL LOW (ref 2.5–4.6)

## 2019-06-30 MED ORDER — POTASSIUM CHLORIDE CRYS ER 20 MEQ PO TBCR
40.0000 meq | EXTENDED_RELEASE_TABLET | Freq: Once | ORAL | Status: AC
  Administered 2019-06-30: 40 meq via ORAL
  Filled 2019-06-30: qty 2

## 2019-06-30 MED ORDER — OXYCODONE HCL 5 MG PO TABS: 5.0000 mg | ORAL_TABLET | Freq: Four times a day (QID) | ORAL | 0 refills | Status: AC | PRN

## 2019-06-30 MED ORDER — K PHOS MONO-SOD PHOS DI & MONO 155-852-130 MG PO TABS: 500.0000 mg | ORAL_TABLET | Freq: Four times a day (QID) | ORAL | 0 refills | Status: AC

## 2019-06-30 MED ORDER — K PHOS MONO-SOD PHOS DI & MONO 155-852-130 MG PO TABS
500.0000 mg | ORAL_TABLET | Freq: Four times a day (QID) | ORAL | Status: DC
  Administered 2019-06-30: 500 mg via ORAL
  Filled 2019-06-30 (×4): qty 2

## 2019-06-30 MED ORDER — LOSARTAN POTASSIUM 25 MG PO TABS: 25.0000 mg | ORAL_TABLET | Freq: Every day | ORAL | 0 refills | Status: DC

## 2019-06-30 NOTE — Discharge Summary (Signed)
Physician Discharge Summary  Megan Megan [name] WER:[id number] DOB: [birth year] DOA: 06/28/2019  PCP: Mick Sell, MD  Admit date: 06/28/2019 Discharge date: 07/01/2019  Time spent: 35 minutes  Recommendations for Outpatient Follow-up:  1. Follow outpatient CBC/CMP 2. Follow with urology outpatient as scheduled  3. Follow blood pressure outpatient, started on losartan - follow BMP within 2 weeks  Discharge Diagnoses:  Active Problems:   Nephrolithiasis   Discharge Condition: stable  Filed Weights   06/28/19 1130 06/30/19 0444  Weight: 88.5 kg 89.2 kg    History of present illness:  Megan Megan [name] 59 y.o.femalewith medical history significant ofmedullary sponge kidney, recurrent nephrolithiasis, recurrent UTI's, hypothyroidism, asthma, PCOS, and multiple other medical problems presenting with abdominal pain.  She notes feeling run down for about 3 weeks. Last Monday (9 days ago) she started noticing urgency, dysuria, typical for UTI. She noted progressive sx with backache, nausea. She noted progressive nausea, dysuria, and incontinence as well. She saw urology on 3/5 and was started on abx. She also had imaging done. Urology was planning for ureteroscopy with laser lithotripsy and stent placement, but her pain progressively worsened. She was seen in urology clinic today with progressive flank pain. Due to her progressive pain, she presented to the emergency room. Denies smoking, etoh. +chills, no fevers. No CP, no SOB. + nausea, vomiting. No hematuria.  She was admitted with e. Coli UTI and nephrolithiasis.  She was treated with abx and had R ureteroscopy with laser lithotripsy and stent placement and retrograde pyelogram with urology on 3/11.  She was discharged on 3/12 after her pain was controlled.  Her BP was elevated and she was started on losartan.  Hospital Course:  E. Coli UTI  Nephrolithiasis  Hx Medullary Sponge Kidney: Dysuria and typical UTI sx started  about 1 week ago followed by sharp lower abdominal pain radiating to groin on R similar to prior stones. CT stone study with bilateral nonobstructive nephrolithiasis and 3 mm distal R ureteral calculus near the UVJ without significant ureteral dilatation or hydronephrosis 3/5 urine cx with pansensitive e. Coli - ancef while admitted. UA today without nitrite/LE, but many bacteria, will continue IV abx, follow repeat cx - NG Toradol, fentanyl, oxycodone for pain (morphine allergy noted, discussed with pharmacy, has tolerated both fentanyl and oxycodone in the past) S/p R ureteroscopy with laser lithotripsy, R retrograde pyelogram, R ureteral stent placement on 3/11 Zofran for nausea Flomax  Hypertensive Urgency:continue amlodipine, HCTZ. Suspect her significantly elevated BP's are related to pain related to above. Treat pain. Labetalol prn SBP >180. BP improved overnight, now back up again Amlodipine, HCTZ Start lostartan at discharge  Trigeminal Neuralgia: tegretol  Asthma: albuterol, dulera, singulair  Hypothyroidism: synthroid  Post Menopausal:continue HRT  GERD: continue PPI  Procedures:  S/p R ureteroscopy with laser lithotripsy, R retrograde pyelogram, R ureteral stent placement on 3/11   Consultations:  urology  Discharge Exam: Vitals:   06/30/19 0000 06/30/19 0800  BP: (!) 148/69 (!) 172/88  Pulse:  82  Resp: 18 15  Temp: 98 F (36.7 C) 97.9 F (36.6 C)  SpO2: 97%    Feels better Ready for discharge  General: No acute distress. Cardiovascular: Heart sounds show Megan Megan [name] regular rate, and rhythm.  Lungs: Clear to auscultation bilaterally  Abdomen: Soft, nontender, nondistended  Neurological: Alert and oriented 3. Moves all extremities 4. Cranial nerves II through XII grossly intact. Skin: Warm and dry. No rashes or lesions. Extremities: No clubbing or cyanosis. No edema.   Discharge  Instructions   Discharge Instructions    Call MD for:   difficulty breathing, headache or visual disturbances   Complete by: As directed    Call MD for:  extreme fatigue   Complete by: As directed    Call MD for:  hives   Complete by: As directed    Call MD for:  persistant dizziness or light-headedness   Complete by: As directed    Call MD for:  persistant nausea and vomiting   Complete by: As directed    Call MD for:  redness, tenderness, or signs of infection (pain, Megan [name], redness, odor or green/yellow discharge around incision site)   Complete by: As directed    Call MD for:  severe uncontrolled pain   Complete by: As directed    Call MD for:  temperature >100.4   Complete by: As directed    Diet - low sodium heart healthy   Complete by: As directed    Discharge instructions   Complete by: As directed    You were seen for Megan Megan [name] UTI and kidney stones.  You had Megan Megan [name] stent placed with urology.  Please follow up with urology outpatient as scheduled.  Complete your course of antibiotics prescribed prior to admission.  Return for new, recurrent, or worsening symptoms.  Please ask your PCP to request records from this hospitalization so they know what was done and what the next steps will be.   Increase activity slowly   Complete by: As directed      Allergies as of 06/30/2019      Reactions   Latex Other (See Comments), Rash   Blisters  Blisters Blisters    Morphine Anaphylaxis, Shortness Of Breath, Nausea And Vomiting, Megan [name], Other (See Comments)   Megan Megan [name]   Nickel Megan [name], Rash   Petrolatum Rash, Other (See Comments)   Blister All petroleum products per patient, facial Megan [name]   Tape Rash   Pt can use paper tape   Erythromycin Nausea Only, Nausea And Vomiting   Nausea/vomiting   Erythromycin Ethylsuccinate Nausea And Vomiting   Hydromorphone Hcl Other (See Comments)   Bad headache   Other Other (See Comments)   Generic medications per patient- redness and burning in gums   Sulfa Antibiotics Rash          Medication List    STOP taking these medications   traMADol 50 MG tablet Commonly known as: ULTRAM     TAKE these medications   albuterol 108 (90 Base) MCG/ACT inhaler Commonly known as: VENTOLIN HFA Inhale 1-2 puffs into the lungs every 6 (six) hours as needed for wheezing or shortness of breath.   amLODipine 10 MG tablet Commonly known as: NORVASC Take 10 mg by mouth daily.   carbamazepine 200 MG tablet Commonly known as: TEGRETOL Take 200 mg by mouth at bedtime.   celecoxib 200 MG capsule Commonly known as: CELEBREX Take 200 mg by mouth 2 (two) times daily.   fluticasone 50 MCG/ACT nasal spray Commonly known as: FLONASE Place 2 sprays into both nostrils in the morning and at bedtime.   Fluticasone-Salmeterol 250-50 MCG/DOSE Aepb Commonly known as: ADVAIR Inhale 1 puff into the lungs 2 (two) times daily.   hydrochlorothiazide 25 MG tablet Commonly known as: HYDRODIURIL Take 25 mg by mouth daily as needed (kidney function).   levothyroxine 50 MCG tablet Commonly known as: SYNTHROID Take 50 mcg by mouth daily before breakfast.   losartan 25 MG tablet Commonly known as: Cozaar Take 1  tablet (25 mg total) by mouth daily. Please follow up with your PCP within 2 weeks for labs and follow up blood pressures   metFORMIN 500 MG 24 hr tablet Commonly known as: GLUCOPHAGE-XR Take 500 mg by mouth 2 (two) times daily with Megan Megan [name] meal.   montelukast 10 MG tablet Commonly known as: SINGULAIR Take 10 mg by mouth daily.   omeprazole 20 MG capsule Commonly known as: PRILOSEC Take 20 mg by mouth 2 (two) times daily before Madeeha Costantino meal.   ondansetron 4 MG tablet Commonly known as: Zofran Take 1 tablet (4 mg total) by mouth every 8 (eight) hours as needed for up to 14 days.   oxybutynin 5 MG tablet Commonly known as: DITROPAN Take 1 tablet (5 mg total) by mouth every 8 (eight) hours as needed for bladder spasms.   oxyCODONE 5 MG immediate release tablet Commonly known as:  Oxy IR/ROXICODONE Take 1 tablet (5 mg total) by mouth every 6 (six) hours as needed for up to 2 days for severe pain.   phosphorus 155-852-130 MG tablet Commonly known as: K PHOS NEUTRAL Take 2 tablets (500 mg total) by mouth 4 (four) times daily for 1 day.   tamsulosin 0.4 MG Caps capsule Commonly known as: FLOMAX Take 1 capsule (0.4 mg total) by mouth daily for 14 days. What changed: Another medication with the same name was added. Make sure you understand how and when to take each.   tamsulosin 0.4 MG Caps capsule Commonly known as: Flomax Take 1 capsule (0.4 mg total) by mouth daily. What changed: You were already taking Aeralyn Barna medication with the same name, and this prescription was added. Make sure you understand how and when to take each.   Vitamin D3 75 MCG (3000 UT) Tabs Take 3,000 Units by mouth daily.   Vivelle-Dot 0.05 MG/24HR patch Generic drug: estradiol Place 1 patch onto the skin every 3 (three) days.     ASK your doctor about these medications   cephALEXin 500 MG capsule Commonly known as: Keflex Take 1 capsule (500 mg total) by mouth 2 (two) times daily for 7 days. Ask about: Should I take this medication?      Allergies  Allergen Reactions  . Latex Other (See Comments) and Rash    Blisters  Blisters Blisters    . Morphine Anaphylaxis, Shortness Of Breath, Nausea And Vomiting, Megan [name] and Other (See Comments)    Megan Megan [name]   . Nickel Megan [name] and Rash  . Petrolatum Rash and Other (See Comments)    Blister All petroleum products per patient, facial Megan [name]   . Tape Rash    Pt can use paper tape  . Erythromycin Nausea Only and Nausea And Vomiting    Nausea/vomiting   . Erythromycin Ethylsuccinate Nausea And Vomiting  . Hydromorphone Hcl Other (See Comments)    Bad headache  . Other Other (See Comments)    Generic medications per patient- redness and burning in gums   . Sulfa Antibiotics Rash        Follow-up Information     Vaillancourt, Aldona Bar, PA-C In 4 weeks.   Specialty: Urology Why: For follow up with renal ultrasound prior to visit Contact information: [place]  [id number] [phone]            The results of significant diagnostics from this hospitalization (including imaging, microbiology, ancillary and laboratory) are listed below for reference.    Significant Diagnostic Studies: Abdomen 1 view ([facility])  Result Date:  06/24/2019 CLINICAL DATA:  Bilateral kidney stones. Pt reports bilateral flank pain, UTI symptoms. EXAM: ABDOMEN - 1 VIEW COMPARISON:  Abdominal radiograph 02/22/2019 FINDINGS: Redemonstrated small bilateral renal calculi, similar burden to the prior radiograph with at least 3 stones on the right and 1 stone on the left. Radiopaque densities in the pelvis likely represent vascular phleboliths. Nonobstructive bowel gas pattern. No acute finding in the visualized skeleton. Lung bases are clear. IMPRESSION: Redemonstrated bilateral renal calculi, similar burden to the prior radiograph. Electronically Signed   By: Emmaline KluverNancy  Ballantyne M.D.   On: 06/24/2019 17:17   DG OR UROLOGY CYSTO IMAGE ([facility] ONLY)  Result Date: 06/29/2019 There is no interpretation for this exam.  This order is for images obtained during Megan Megan [name] surgical procedure.  Please See "Surgeries" Tab for more information regarding the procedure.   CT RENAL STONE STUDY  Result Date: 06/26/2019 CLINICAL DATA:  Right flank pain. EXAM: CT ABDOMEN AND PELVIS WITHOUT CONTRAST TECHNIQUE: Multidetector CT imaging of the abdomen and pelvis was performed following the standard protocol without IV contrast. COMPARISON:  None. FINDINGS: Lower chest: No acute abnormality. Hepatobiliary: Small gallstone is noted. No biliary dilatation is noted. Hepatic steatosis is noted. Pancreas: Unremarkable. No pancreatic ductal dilatation or surrounding inflammatory changes. Spleen: Normal in size without focal abnormality. Adrenals/Urinary  Tract: Adrenal glands appear normal. Bilateral nonobstructive nephrolithiasis is noted. 3 mm calculus is noted in the distal right ureter near the ureterovesical junction. No significant ureteral dilatation or hydronephrosis is noted. Urinary bladder is unremarkable. Stomach/Bowel: Stomach appears normal. There is no evidence of bowel obstruction or inflammation. Status post appendectomy. Vascular/Lymphatic: Aortic atherosclerosis. No enlarged abdominal or pelvic lymph nodes. Reproductive: Status post hysterectomy. No adnexal masses. Other: No abdominal wall hernia or abnormality. No abdominopelvic ascites. Musculoskeletal: No acute or significant osseous findings. IMPRESSION: 1. Bilateral nonobstructive nephrolithiasis. 2. 3 mm distal right ureteral calculus is noted near the ureterovesical junction, but no significant ureteral dilatation or hydronephrosis is noted. 3. Hepatic steatosis. 4. Small gallstone. Aortic Atherosclerosis (ICD10-I70.0). Electronically Signed   By: [name] RaiderJames  Green Jr M.D.   On: 06/26/2019 14:15    Microbiology: Recent Results (from the past 240 hour(s))  Microscopic Examination     Status: Abnormal   Collection Time: 06/23/19  3:41 PM   URINE  Result Value Ref Range Status   WBC, UA 11-30 (Megan Megan [name]) 0 - 5 /hpf Final   RBC 3-10 (Megan Megan [name]) 0 - 2 /hpf Final   Epithelial Cells (non renal) >10 (Megan Megan [name]) 0 - 10 /hpf Final   Renal Epithel, UA 0-10 (Megan Megan [name]) None seen /hpf Final   Bacteria, UA Moderate (Megan Megan [name]) None seen/Few Final  CULTURE, URINE COMPREHENSIVE     Status: Abnormal   Collection Time: 06/23/19  4:19 PM   Specimen: Urine   URINE  Result Value Ref Range Status   Urine Culture, Comprehensive Final report (Megan Megan [name])  Final   Organism ID, Bacteria Escherichia coli (Megan Megan [name])  Final    Comment: Greater than 100,000 colony forming units per mL Cefazolin <=4 ug/mL Cefazolin with an MIC <=16 predicts susceptibility to the oral agents cefaclor, cefdinir, cefpodoxime, cefprozil, cefuroxime, cephalexin, and loracarbef  when used for therapy of uncomplicated urinary tract infections due to E. coli, Klebsiella pneumoniae, and Proteus mirabilis.    ANTIMICROBIAL SUSCEPTIBILITY Comment  Final    Comment:       ** S = Susceptible; I = Intermediate; R = Resistant **  P = Positive; N = Negative             MICS are expressed in micrograms per mL    Antibiotic                 RSLT#1    RSLT#2    RSLT#3    RSLT#4 Amoxicillin/Clavulanic Acid    S Ampicillin                     S Cefepime                       S Ceftriaxone                    S Cefuroxime                     S Ciprofloxacin                  S Ertapenem                      S Gentamicin                     S Imipenem                       S Levofloxacin                   S Meropenem                      S Nitrofurantoin                 S Piperacillin/Tazobactam        S Tetracycline                   S Tobramycin                     S Trimethoprim/Sulfa             S   SARS CORONAVIRUS 2 (TAT 6-24 HRS) Nasopharyngeal Nasopharyngeal Swab     Status: None   Collection Time: 06/27/19 10:31 AM   Specimen: Nasopharyngeal Swab  Result Value Ref Range Status   SARS Coronavirus 2 NEGATIVE NEGATIVE Final    Comment: (NOTE) SARS-CoV-2 target nucleic acids are NOT DETECTED. The SARS-CoV-2 RNA is generally detectable in upper and lower respiratory specimens during the acute phase of infection. Negative results do not preclude SARS-CoV-2 infection, do not rule out co-infections with other pathogens, and should not be used as the sole basis for treatment or other patient management decisions. Negative results must be combined with clinical observations, patient history, and epidemiological information. The expected result is Negative. Fact Sheet for Patients: HairSlick.no Fact Sheet for Healthcare Providers: quierodirigir.com This test is not yet approved or cleared by the  [place] FDA and  has been authorized for detection and/or diagnosis of SARS-CoV-2 by FDA under an Emergency Use Authorization (EUA). This EUA will remain  in effect (meaning this test can be used) for the duration of the COVID-19 declaration under Section 56 4(b)(1) of the Act, 21 U.S.C. section 360bbb-3(b)(1), unless the authorization is terminated or revoked sooner. Performed at [facility], [address], [place], [place] [address]   Urine Culture     Status: None   Collection Time: 06/28/19  11:32 AM   Specimen: Urine, Random  Result Value Ref Range Status   Specimen Description   Final    URINE, RANDOM Performed at [facility], [address] [place], [place] [address]    Special Requests   Final    NONE Performed at [facility], [address], [place], [place] [address]    Culture   Final    NO GROWTH Performed at [facility], [address] [place], [place] [address]    Report Status 06/30/2019 FINAL  Final  Microscopic Examination     Status: Abnormal   Collection Time: 06/28/19 11:45 AM   URINE  Result Value Ref Range Status   WBC, UA 0-5 0 - 5 /hpf Final   RBC None seen 0 - 2 /hpf Final   Epithelial Cells (non renal) 0-10 0 - 10 /hpf Final   Bacteria, UA Moderate (Khamarion Bjelland) None seen/Few Final     Labs: Basic Metabolic Panel: Recent Labs  Lab 06/27/19 1031 06/28/19 1133 06/29/19 0511 06/30/19 0532  NA 139 137 138 138  K 4.0 4.0 4.0 3.4*  CL 105 103 104 107  CO2 GLUCOSE 118* 103* 124* 116*  BUN CREATININE 0.69 0.64 0.61 0.71  CALCIUM 9.3 9.3 8.6* 8.6*  MG  --   --  1.9 2.3  PHOS  --   --  2.6 1.8*   Liver Function Tests: Recent Labs  Lab 06/28/19 1133 06/29/19 0511 06/30/19 0532  AST ALT ALKPHOS 68 56 54  BILITOT 0.7 0.4 0.4  PROT 7.4 6.2* 6.2*  ALBUMIN 4.3 3.7 3.6   Recent Labs  Lab 06/28/19 1133  LIPASE 31   No results for input(s): AMMONIA in the  last 168 hours. CBC: Recent Labs  Lab 06/27/19 1031 06/28/19 1133 06/29/19 0511 06/30/19 0532  WBC 5.2 6.3 7.6 9.5  NEUTROABS  --   --   --  6.4  HGB 14.1 14.5 13.1 13.1  HCT 42.6 43.7 40.1 40.3  MCV 97.3 98.0 99.8 99.0  PLT 304 317 281 297   Cardiac Enzymes: No results for input(s): CKTOTAL, CKMB, CKMBINDEX, TROPONINI in the last 168 hours. BNP: BNP (last 3 results) No results for input(s): BNP in the last 8760 hours.  ProBNP (last 3 results) No results for input(s): PROBNP in the last 8760 hours.  CBG: Recent Labs  Lab 06/29/19 1036  GLUCAP 117*       Signed:  Lacretia Nicks MD.  [facility] 07/01/2019, 8:55 PM

## 2019-06-30 NOTE — Consult Note (Signed)
Pharmacy Antibiotic Note  Megan [name] is a 59 y.o. female admitted on 06/28/2019 with UTI.  Pharmacy has been consulted for Cefazolin dosing.  UCx from 5 days ago was pan-sensitive; patient was on keflex in the outpatient setting.   Plan: 1. Day 4 Abx. Continue  Cefazolin 1g Q8H   Possible DC home today   Height: 5\' 4"  (162.6 cm) Weight: 196 lb 10.4 oz (89.2 kg) IBW/kg (Calculated) : 54.7  Temp (24hrs), Avg:98.2 F (36.8 C), Min:97.8 F (36.6 C), Max:99.5 F (37.5 C)  Recent Labs  Lab 06/27/19 1031 06/28/19 1133 06/29/19 0511 06/30/19 0532  WBC 5.2 6.3 7.6 9.5  CREATININE 0.69 0.64 0.61 0.71    Estimated Creatinine Clearance: 81.9 mL/min (by C-G formula based on SCr of 0.71 mg/dL).    Allergies  Allergen Reactions  . Latex Other (See Comments) and Rash    Blisters  Blisters Blisters    . Morphine Anaphylaxis, Shortness Of Breath, Nausea And Vomiting, Swelling and Other (See Comments)    Ha's and facial swelling   . Nickel Swelling and Rash  . Petrolatum Rash and Other (See Comments)    Blister All petroleum products per patient, facial swelling   . Tape Rash    Pt can use paper tape  . Erythromycin Nausea Only and Nausea And Vomiting    Nausea/vomiting   . Erythromycin Ethylsuccinate Nausea And Vomiting  . Hydromorphone Hcl Other (See Comments)    Bad headache  . Other Other (See Comments)    Generic medications per patient- redness and burning in gums   . Sulfa Antibiotics Rash          Thank you for allowing pharmacy to be a part of this patient's care.  08/30/19, PharmD, BCPS Clinical Pharmacist 06/30/2019 11:49 AM

## 2019-07-01 DIAGNOSIS — N201 Calculus of ureter: Secondary | ICD-10-CM

## 2019-07-06 NOTE — Anesthesia Postprocedure Evaluation (Signed)
Anesthesia Post Note  Patient: Megan [name]  Procedure(s) Performed: CYSTOSCOPY/URETEROSCOPY/HOLMIUM LASER/STENT PLACEMENT (Right Ureter)  Patient location during evaluation: PACU Anesthesia Type: General Level of consciousness: awake and alert Pain management: pain level controlled Vital Signs Assessment: post-procedure vital signs reviewed and stable Respiratory status: spontaneous breathing, nonlabored ventilation and respiratory function stable Cardiovascular status: blood pressure returned to baseline and stable Postop Assessment: no apparent nausea or vomiting Anesthetic complications: no     Last Vitals:  Vitals:   06/30/19 0000 06/30/19 0800  BP: (!) 148/69 (!) 172/88  Pulse:  82  Resp: 18 15  Temp: 36.7 C 36.6 C  SpO2: 97%     Last Pain:  Vitals:   06/30/19 0800  TempSrc: Axillary  PainSc: 0-No pain                 Christia Reading

## 2019-07-07 ENCOUNTER — Encounter (INDEPENDENT_AMBULATORY_CARE_PROVIDER_SITE_OTHER): Payer: Self-pay

## 2019-07-07 ENCOUNTER — Encounter (INDEPENDENT_AMBULATORY_CARE_PROVIDER_SITE_OTHER): Payer: Self-pay | Admitting: Hospital

## 2019-07-28 ENCOUNTER — Ambulatory Visit: Payer: BC Managed Care – PPO

## 2019-08-01 ENCOUNTER — Ambulatory Visit: Payer: BC Managed Care – PPO | Admitting: Physician Assistant

## 2019-08-01 ENCOUNTER — Ambulatory Visit: Payer: BC Managed Care – PPO | Admitting: Urology

## 2019-08-02 ENCOUNTER — Other Ambulatory Visit: Payer: Self-pay

## 2019-08-02 ENCOUNTER — Ambulatory Visit: Payer: BC Managed Care – PPO

## 2019-08-02 ENCOUNTER — Ambulatory Visit
Admission: RE | Admit: 2019-08-02 | Discharge: 2019-08-02 | Disposition: A | Discharge status: Home / Self Care | Payer: BC Managed Care – PPO | Source: Ambulatory Visit | Attending: Urology | Admitting: Urology

## 2019-08-02 DIAGNOSIS — N201 Calculus of ureter: Secondary | ICD-10-CM | POA: Insufficient documentation

## 2019-08-04 ENCOUNTER — Ambulatory Visit: Payer: BLUE CROSS/BLUE SHIELD | Admitting: Urology

## 2019-08-07 ENCOUNTER — Other Ambulatory Visit: Payer: Self-pay

## 2019-08-07 DIAGNOSIS — N200 Calculus of kidney: Secondary | ICD-10-CM

## 2019-08-09 ENCOUNTER — Ambulatory Visit: Payer: BC Managed Care – PPO | Admitting: Urology

## 2019-08-11 ENCOUNTER — Telehealth: Payer: Self-pay | Admitting: *Deleted

## 2019-08-11 NOTE — Telephone Encounter (Addendum)
Patient informed, scheduled follow up-preferred [place] location.   ----- Message from Vanna Scotland, MD sent at 08/11/2019 12:14 PM EDT ----- This patient's renal ultrasound looks fine.  It looks like her follow-up appointments were canceled.  She really does need to follow-up with [place], at minimum for 2-month recheck with [facility].  Please arrange.  Vanna Scotland, MD

## 2019-08-16 ENCOUNTER — Other Ambulatory Visit: Payer: Self-pay | Admitting: Orthopedic Surgery

## 2019-08-16 DIAGNOSIS — G8929 Other chronic pain: Secondary | ICD-10-CM

## 2019-08-20 DIAGNOSIS — G8929 Other chronic pain: Secondary | ICD-10-CM | POA: Insufficient documentation

## 2019-08-20 DIAGNOSIS — M5442 Lumbago with sciatica, left side: Secondary | ICD-10-CM | POA: Insufficient documentation

## 2019-08-20 DIAGNOSIS — N201 Calculus of ureter: Secondary | ICD-10-CM

## 2019-08-20 DIAGNOSIS — R1030 Lower abdominal pain, unspecified: Secondary | ICD-10-CM | POA: Insufficient documentation

## 2019-08-23 ENCOUNTER — Ambulatory Visit: Payer: BLUE CROSS/BLUE SHIELD | Admitting: Urology

## 2019-08-25 ENCOUNTER — Ambulatory Visit: Payer: BLUE CROSS/BLUE SHIELD | Admitting: Urology

## 2019-08-30 ENCOUNTER — Ambulatory Visit
Admission: RE | Admit: 2019-08-30 | Discharge: 2019-08-30 | Disposition: A | Discharge status: Home / Self Care | Payer: BC Managed Care – PPO | Source: Ambulatory Visit | Attending: Orthopedic Surgery | Admitting: Orthopedic Surgery

## 2019-08-30 ENCOUNTER — Other Ambulatory Visit: Payer: Self-pay

## 2019-08-30 DIAGNOSIS — G8929 Other chronic pain: Secondary | ICD-10-CM | POA: Diagnosis present

## 2019-08-30 DIAGNOSIS — M5441 Lumbago with sciatica, right side: Secondary | ICD-10-CM | POA: Insufficient documentation

## 2019-09-06 ENCOUNTER — Ambulatory Visit: Payer: BC Managed Care – PPO | Admitting: Urology

## 2019-09-21 DIAGNOSIS — E669 Obesity, unspecified: Secondary | ICD-10-CM | POA: Insufficient documentation

## 2019-09-21 DIAGNOSIS — K219 Gastro-esophageal reflux disease without esophagitis: Secondary | ICD-10-CM | POA: Insufficient documentation

## 2019-10-25 ENCOUNTER — Encounter (INDEPENDENT_AMBULATORY_CARE_PROVIDER_SITE_OTHER): Payer: Self-pay | Admitting: Hospital

## 2019-11-02 ENCOUNTER — Encounter (INDEPENDENT_AMBULATORY_CARE_PROVIDER_SITE_OTHER): Payer: Self-pay | Admitting: Hospital

## 2019-11-13 ENCOUNTER — Other Ambulatory Visit (INDEPENDENT_AMBULATORY_CARE_PROVIDER_SITE_OTHER): Payer: Self-pay | Admitting: Internal Medicine

## 2019-11-13 DIAGNOSIS — Z5181 Encounter for therapeutic drug level monitoring: Secondary | ICD-10-CM

## 2019-11-13 DIAGNOSIS — E1169 Type 2 diabetes mellitus with other specified complication: Secondary | ICD-10-CM

## 2019-11-13 NOTE — Telephone Encounter (Signed)
Patient is requesting refill via MyChart       Established with: [place], [place] with PCP: Visit date not found   Next OV with PCP: Visit date not found   Last refill date: 11/26/2016      Requested Medication(s):  Requested Prescriptions     Pending Prescriptions Disp Refills    carBAMazepine (TEGRETOL) 200 MG tablet [Pharmacy Med Name: CARBAMAZEPINE 200MG  TABLETS] 90 tablet 1     Sig: TAKE 1 TABLET(200 MG) BY MOUTH DAILY       Send to:     [id number] [place], [place] - [address] AT [address]  [place]  [place] [address]  Phone: [phone] Fax: [phone]

## 2019-11-13 NOTE — Telephone Encounter (Signed)
[facility]     carBAMazepine  is not currently included in the Pharmacy Refill Clinic protocols. Re-routing to the responsible staff for processing.  Thank you

## 2019-11-21 ENCOUNTER — Telehealth: Payer: Self-pay

## 2019-11-21 NOTE — Telephone Encounter (Signed)
Attempted to call  No answer. Left message to confirm appt and to make sure she brings in her medications and complete history form. If she can not keep appt to please call our office.

## 2019-11-21 NOTE — Progress Notes (Signed)
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 (DVT) 11/11/2018  . Hypothyroidism 11/01/2016  . 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 Mild intermittent asthma 11/11/2018  . Derangement, knee internal 11/22/2019  . OA (osteoarthritis) of knee 11/04/2015  . Primary osteoarthritis of right knee 11/06/2015  . Obesity, Class II, BMI 35-39.9 09/21/2019  . Orthostatic hypotension 11/12/2018  . Pain in eye 03/28/2012  . Prediabetes 11/02/2018  . Recurrent UTI 11/01/2016  . Trigeminal neuralgia 03/28/2012  . Valgus deformity, not elsewhere classified, unspecified knee 02/01/2019  . Calculus of kidney 03/26/2016  . Chronic pain syndrome 11/22/2019  . Pharmacologic therapy  11/22/2019  . Disorder of skeletal system 11/22/2019  . Problems influencing health status 11/22/2019  . Abnormal MRI, lumbar spine (08/31/2019) 11/22/2019  . Chronic hip pain (2ry area of Pain) (Bilateral) (R>L) 11/22/2019  . Chronic lower extremity pain (3ry area of Pain) (Bilateral) (R>L) 11/22/2019  . Chronic knee pain (4th area of Pain) (Bilateral) (R>L) 11/22/2019  .  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 Chronic ankle and foot pain (5th area of Pain) (Right) 11/22/2019  . Foot numbness (Left) 11/22/2019  . Chronic lumbar radiculitis (Bilateral) (Right: Pain) (Left: Numbness) 11/22/2019  . Lumbar facet arthropathy (Multilevel) (Bilateral) 11/22/2019  . Chronic low back pain (Bilateral) w/o sciatica 11/22/2019  . 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 DDD (degenerative disc disease), lumbosacral 11/22/2019  . Lumbar facet syndrome (Bilateral) 11/22/2019  . Lumbar foraminal stenosis (L2-3, L4-5) (Bilateral) 11/22/2019  . Lumbar lateral recess stenosis (L3-4) (Bilateral) 11/22/2019   Resolved Ambulatory Problems    Diagnosis Date Noted  . No Resolved Ambulatory Problems   Past Medical History:  Diagnosis Date  . Asthma   . Diabetes mellitus without complication ([place])   . Hypertension   . Thyroid disease    Constitutional Exam  General appearance: Well nourished, well developed, and well hydrated. In no apparent acute distress Vitals:   11/22/19 0838  BP: (!) 170/98  Pulse: 88  Resp: 17  Temp: (!) 97.1 F (36.2 C)  TempSrc: Temporal  SpO2: 100%  Weight: 200 lb (90.7 kg)  Height: 5' 2.5" (1.588 m)   BMI Assessment: Estimated body mass index is 36 kg/m as calculated from the following:   Height as of this encounter: 5' 2.5" (1.588 m).   Weight as of this encounter: 200 lb (90.7 kg).  BMI interpretation table: BMI level Category Range association with higher incidence of chronic pain  <18 kg/m2 Underweight   18.5-24.9 kg/m2 Ideal body weight   25-29.9 kg/m2 Overweight Increased incidence by 20%  30-34.9 kg/m2 Obese (Class I) Increased incidence by 68%  35-39.9 kg/m2 Severe obesity (Class II) Increased incidence by 136%  >40 kg/m2 Extreme obesity (Class III) Increased incidence by 254%   Patient's current BMI Ideal Body weight  Body mass index is 36 kg/m. Ideal body weight: 51.3 kg (112 lb 15.8 oz) Adjusted  ideal body weight: 67 kg (147 lb 12.7 oz)   BMI Readings from Last 4 Encounters:  11/22/19 36.00 kg/m  06/30/19 33.76 kg/m  06/28/19 33.47 kg/m  06/23/19 33.47 kg/m   Wt Readings from Last 4 Encounters:  11/22/19 200 lb (90.7 kg)  06/30/19 196 lb 10.4 oz (89.2 kg)  06/28/19 195 lb (88.5 kg)  06/23/19 195 lb (88.5 kg)    Psych/Mental status: Alert, oriented x 3 (person, place, & time)       Eyes: PERLA Respiratory: No evidence of acute respiratory distress  Lumbar Exam  Skin & Axial Inspection: No masses, redness, or swelling Alignment: Asymmetric Functional ROM: Decreased ROM affecting both sides Stability: No instability detected Muscle Tone/Strength: Guarding detected Sensory (Neurological): Movement-associated pain Palpation: Complains of area being tender to palpation       Provocative Tests: Hyperextension/rotation test: (+) bilaterally for facet joint pain. Lumbar quadrant test (Kemp's test): (+) bilaterally for facet joint pain. Patrick's Maneuver: (+) for bilateral hip arthralgia             FABER* test: (+) for bilateral hip arthralgia             *(Flexion, ABduction and  External Rotation)  Gait & Posture Assessment  Ambulation: Patient ambulates using a cane Gait: Antalgic gait (limping).  In addition the patient demonstrated having problems with balance. Posture: Difficulty standing up straight, due to pain   Lower Extremity Exam    Side: Right lower extremity  Side: Left lower extremity  Stability: No instability observed          Stability: No instability observed          Skin & Extremity Inspection: Skin color, temperature, and hair growth are WNL. No peripheral edema or cyanosis. No masses, redness, swelling, asymmetry, or associated skin lesions. No contractures.  Skin & Extremity Inspection: Skin color, temperature, and hair growth are WNL. No peripheral edema or cyanosis. No masses, redness, swelling, asymmetry, or associated skin lesions. No  contractures.  Functional ROM: Decreased ROM for hip and knee joints Adequate SLR (straight leg raise)  Functional ROM: Decreased ROM for hip joint Adequate SLR (straight leg raise)  Muscle Tone/Strength: Able to Toe-walk & Heel-walk without problems  Muscle Tone/Strength: Able to Toe-walk & Heel-walk without problems  Sensory (Neurological): Movement-associated pain        Sensory (Neurological): Movement-associated discomfort        DTR: Patellar: 1+: trace (total knee replacement) Achilles: 2+: normal Plantar: deferred today  DTR: Patellar: 2+: normal Achilles: 0: absent Plantar: deferred today  Palpation: Complains of area being tender to palpation  Palpation: No palpable anomalies   Assessment  Primary Diagnosis & Pertinent Problem List: The primary encounter diagnosis was Chronic pain syndrome. 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), Chronic ankle and foot pain (5th area of Pain) (Right), Foot numbness (Left), Chronic lumbar radiculitis (Bilateral) (Right: Pain) (Left: Numbness), Pharmacologic therapy, Disorder of skeletal system, Problems influencing health status, Abnormal MRI, lumbar spine (08/31/2019), Lumbar facet arthropathy (Multilevel) (Bilateral), Chronic low back pain (Bilateral) w/o sciatica, Spinal stenosis of lumbar region with neurogenic claudication, DDD (degenerative disc disease), lumbosacral, Lumbar facet syndrome (Bilateral), Lumbar foraminal stenosis (L2-3, L4-5) (Bilateral), and Lumbar lateral recess stenosis (L3-4) (Bilateral) were also pertinent to this visit.  Visit Diagnosis (New problems to examiner): 1. Chronic pain syndrome   2. Chronic low back pain (1ry area of Pain) (Bilateral) (R>L) w/  sciatica (Bilateral)   3. Chronic hip pain (2ry area of Pain) (Bilateral) (R>L)   4. Chronic lower extremity pain (3ry area of Pain) (Bilateral) (R>L)   5. Chronic knee pain (4th area of Pain) (Bilateral) (R>L)   6. 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 Chronic ankle and foot pain (5th area of Pain) (Right)   8. Foot numbness (Left)   9. Chronic lumbar radiculitis (Bilateral) (Right: Pain) (Left: Numbness)   10. Pharmacologic therapy   11. Disorder of skeletal system   12. Problems influencing health status   13. Abnormal MRI, lumbar spine (08/31/2019)   14. Lumbar facet arthropathy (Multilevel) (Bilateral)   15. Chronic low back pain (Bilateral) w/o sciatica   16. Spinal stenosis of lumbar region with neurogenic claudication   17. DDD (degenerative disc disease), lumbosacral   18. Lumbar facet syndrome (Bilateral)   19. Lumbar foraminal stenosis (L2-3, L4-5) (Bilateral)   20. Lumbar lateral recess stenosis (L3-4) (Bilateral)    Plan of Care (Initial workup plan)  Note: Megan [name] was reminded that as per protocol, today's visit has been an evaluation only. We have not taken over the patient's controlled substance management.  Problem-specific plan: No problem-specific Assessment & Plan notes found for this encounter.   Lab Orders     Compliance Drug Analysis, Ur     Comp. Metabolic Panel (12)     Magnesium     Vitamin B12     Sedimentation rate     25-Hydroxy vitamin D Lcms D2+D3     C-reactive protein  Imaging Orders     DG HIP UNILAT W OR W/O PELVIS 2-3 VIEWS RIGHT     DG HIP UNILAT W OR W/O PELVIS 2-3 VIEWS LEFT     DG Knee 1-2 Views Right     DG Knee 1-2 Views Left Referral Orders  No referral(s) requested today   Procedure Orders    No procedure(s) ordered today   Pharmacotherapy (current): Medications ordered:  No orders of the defined types were placed in this encounter.  Medications administered during this visit: Megan [name] had no medications  administered during this visit.   Pharmacological management options:  Opioid Analgesics: The patient was informed that there is no guarantee that she would be a candidate for opioid analgesics. The decision will be made following CDC guidelines. This decision will be based on the results of diagnostic studies, as well as Megan [name] risk profile.   Membrane stabilizer: To be determined at a later time  Muscle relaxant: To be determined at a later time  NSAID: To be determined at a later time  Other analgesic(s): To be determined at a later time   Interventional management options: Megan [name] was informed that there is no guarantee that she would be a candidate for interventional therapies. The decision will be based on the results of diagnostic studies, as well as Megan [name] risk profile.  Procedure(s) under consideration:  Diagnostic bilateral lumbar facet block  Diagnostic bilateral intra-articular hip joint injection  Diagnostic right L4-5 LESI  Diagnostic bilateral L3 transforaminal ESI    Provider-requested follow-up: Return for F2F encounter, 2V(40-min) on eval day, (s/p Tests).  Future Appointments  Date Time Provider [place]  02/02/2020 10:00 AM Hollice Espy, MD [facility] None    Note by: Gaspar Cola, MD Date: 11/22/2019; Time: 6:08 PM

## 2019-11-22 ENCOUNTER — Ambulatory Visit
Admission: RE | Admit: 2019-11-22 | Discharge: 2019-11-22 | Disposition: A | Discharge status: Home / Self Care | Payer: BC Managed Care – PPO | Source: Ambulatory Visit | Attending: Pain Medicine | Admitting: Pain Medicine

## 2019-11-22 ENCOUNTER — Encounter: Payer: Self-pay | Admitting: Pain Medicine

## 2019-11-22 ENCOUNTER — Other Ambulatory Visit: Payer: Self-pay

## 2019-11-22 ENCOUNTER — Ambulatory Visit (HOSPITAL_BASED_OUTPATIENT_CLINIC_OR_DEPARTMENT_OTHER): Payer: BC Managed Care – PPO | Admitting: Pain Medicine

## 2019-11-22 ENCOUNTER — Telehealth: Payer: Self-pay | Admitting: *Deleted

## 2019-11-22 VITALS — BP 170/98 | HR 88 | Temp 97.1°F | Resp 17 | Ht 62.5 in | Wt 200.0 lb

## 2019-11-22 DIAGNOSIS — G8929 Other chronic pain: Secondary | ICD-10-CM | POA: Insufficient documentation

## 2019-11-22 DIAGNOSIS — M25561 Pain in right knee: Secondary | ICD-10-CM | POA: Insufficient documentation

## 2019-11-22 DIAGNOSIS — M25551 Pain in right hip: Secondary | ICD-10-CM

## 2019-11-22 DIAGNOSIS — M48062 Spinal stenosis, lumbar region with neurogenic claudication: Secondary | ICD-10-CM | POA: Insufficient documentation

## 2019-11-22 DIAGNOSIS — M47816 Spondylosis without myelopathy or radiculopathy, lumbar region: Secondary | ICD-10-CM

## 2019-11-22 DIAGNOSIS — M48061 Spinal stenosis, lumbar region without neurogenic claudication: Secondary | ICD-10-CM | POA: Insufficient documentation

## 2019-11-22 DIAGNOSIS — M5137 Other intervertebral disc degeneration, lumbosacral region: Secondary | ICD-10-CM | POA: Insufficient documentation

## 2019-11-22 DIAGNOSIS — Z79899 Other long term (current) drug therapy: Secondary | ICD-10-CM

## 2019-11-22 DIAGNOSIS — M25562 Pain in left knee: Secondary | ICD-10-CM

## 2019-11-22 DIAGNOSIS — R200 Anesthesia of skin: Secondary | ICD-10-CM | POA: Insufficient documentation

## 2019-11-22 DIAGNOSIS — M5416 Radiculopathy, lumbar region: Secondary | ICD-10-CM | POA: Insufficient documentation

## 2019-11-22 DIAGNOSIS — Z789 Other specified health status: Secondary | ICD-10-CM | POA: Insufficient documentation

## 2019-11-22 DIAGNOSIS — Z96651 Presence of right artificial knee joint: Secondary | ICD-10-CM | POA: Insufficient documentation

## 2019-11-22 DIAGNOSIS — M5441 Lumbago with sciatica, right side: Secondary | ICD-10-CM

## 2019-11-22 DIAGNOSIS — M545 Low back pain, unspecified: Secondary | ICD-10-CM | POA: Insufficient documentation

## 2019-11-22 DIAGNOSIS — M899 Disorder of bone, unspecified: Secondary | ICD-10-CM | POA: Insufficient documentation

## 2019-11-22 DIAGNOSIS — M5442 Lumbago with sciatica, left side: Secondary | ICD-10-CM | POA: Insufficient documentation

## 2019-11-22 DIAGNOSIS — R208 Other disturbances of skin sensation: Secondary | ICD-10-CM | POA: Insufficient documentation

## 2019-11-22 DIAGNOSIS — M25552 Pain in left hip: Secondary | ICD-10-CM | POA: Insufficient documentation

## 2019-11-22 DIAGNOSIS — M2390 Unspecified internal derangement of unspecified knee: Secondary | ICD-10-CM | POA: Insufficient documentation

## 2019-11-22 DIAGNOSIS — M79604 Pain in right leg: Secondary | ICD-10-CM | POA: Insufficient documentation

## 2019-11-22 DIAGNOSIS — M25571 Pain in right ankle and joints of right foot: Secondary | ICD-10-CM | POA: Insufficient documentation

## 2019-11-22 DIAGNOSIS — G894 Chronic pain syndrome: Secondary | ICD-10-CM | POA: Insufficient documentation

## 2019-11-22 DIAGNOSIS — M51379 Other intervertebral disc degeneration, lumbosacral region without mention of lumbar back pain or lower extremity pain: Secondary | ICD-10-CM

## 2019-11-22 DIAGNOSIS — M79605 Pain in left leg: Secondary | ICD-10-CM | POA: Insufficient documentation

## 2019-11-22 DIAGNOSIS — R937 Abnormal findings on diagnostic imaging of other parts of musculoskeletal system: Secondary | ICD-10-CM | POA: Insufficient documentation

## 2019-11-22 NOTE — Progress Notes (Signed)
Safety precautions to be maintained throughout the outpatient stay will include: orient to surroundings, keep bed in low position, maintain call bell within reach at all times, provide assistance with transfer out of bed and ambulation.  

## 2019-11-22 NOTE — Patient Instructions (Signed)
____________________________________________________________________________________________  General Risks and Possible Complications  Patient Responsibilities: It is important that you read this as it is part of your informed consent. It is our duty to inform you of the risks and possible complications associated with treatments offered to you. It is your responsibility as a patient to read this and to ask questions about anything that is not clear or that you believe was not covered in this document.  Patient's Rights: You have the right to refuse treatment. You also have the right to change your mind, even after initially having agreed to have the treatment done. However, under this last option, if you wait until the last second to change your mind, you may be charged for the materials used up to that point.  Introduction: Medicine is not an exact science. Everything in Medicine, including the lack of treatment(s), carries the potential for danger, harm, or loss (which is by definition: Risk). In Medicine, a complication is a secondary problem, condition, or disease that can aggravate an already existing one. All treatments carry the risk of possible complications. The fact that a side effects or complications occurs, does not imply that the treatment was conducted incorrectly. It must be clearly understood that these can happen even when everything is done following the highest safety standards.  No treatment: You can choose not to proceed with the proposed treatment alternative. The "PRO(s)" would include: avoiding the risk of complications associated with the therapy. The "CON(s)" would include: not getting any of the treatment benefits. These benefits fall under one of three categories: diagnostic; therapeutic; and/or palliative. Diagnostic benefits include: getting information which can ultimately lead to improvement of the disease or symptom(s). Therapeutic benefits are those associated with the  successful treatment of the disease. Finally, palliative benefits are those related to the decrease of the primary symptoms, without necessarily curing the condition (example: decreasing the pain from a flare-up of a chronic condition, such as incurable terminal cancer).  General Risks and Complications: These are associated to most interventional treatments. They can occur alone, or in combination. They fall under one of the following six (6) categories: no benefit or worsening of symptoms; bleeding; infection; nerve damage; allergic reactions; and/or death. 1. No benefits or worsening of symptoms: In Medicine there are no guarantees, only probabilities. No healthcare provider can ever guarantee that a medical treatment will work, they can only state the probability that it may. Furthermore, there is always the possibility that the condition may worsen, either directly, or indirectly, as a consequence of the treatment. 2. Bleeding: This is more common if the patient is taking a blood thinner, either prescription or over the counter (example: Goody Powders, Fish oil, Aspirin, Garlic, etc.), or if suffering a condition associated with impaired coagulation (example: Hemophilia, cirrhosis of the liver, low platelet counts, etc.). However, even if you do not have one on these, it can still happen. If you have any of these conditions, or take one of these drugs, make sure to notify your treating physician. 3. Infection: This is more common in patients with a compromised immune system, either due to disease (example: diabetes, cancer, human immunodeficiency virus [HIV], etc.), or due to medications or treatments (example: therapies used to treat cancer and rheumatological diseases). However, even if you do not have one on these, it can still happen. If you have any of these conditions, or take one of these drugs, make sure to notify your treating physician. 4. Nerve Damage: This is more common when the   treatment is  an invasive one, but it can also happen with the use of medications, such as those used in the treatment of cancer. The damage can occur to small secondary nerves, or to large primary ones, such as those in the spinal cord and brain. This damage may be temporary or permanent and it may lead to impairments that can range from temporary numbness to permanent paralysis and/or brain death. 5. Allergic Reactions: Any time a substance or material comes in contact with our body, there is the possibility of an allergic reaction. These can range from a mild skin rash (contact dermatitis) to a severe systemic reaction (anaphylactic reaction), which can result in death. 6. Death: In general, any medical intervention can result in death, most of the time due to an unforeseen complication. ____________________________________________________________________________________________   ______________________________________________________________________________________________  Specialty Pain Scale  Introduction:  There are significant differences in how pain is reported. The word pain usually refers to physical pain, but it is also a common synonym of suffering. The medical community uses a scale from 0 (zero) to 10 (ten) to report pain level. Zero (0) is described as "no pain", while ten (10) is described as "the worse pain you can imagine". The problem with this scale is that physical pain is reported along with suffering. Suffering refers to mental pain, or more often yet it refers to any unpleasant feeling, emotion or aversion associated with the perception of harm or threat of harm. It is the psychological component of pain.  Pain Specialists prefer to separate the two components. The pain scale used by this practice is the Verbal Numerical Rating Scale (VNRS-11). This scale is for the physical pain only. DO NOT INCLUDE how your pain psychologically affects you. This scale is for adults 21 years of age and  older. It has 11 (eleven) levels. The 1st level is 0/10. This means: "right now, I have no pain". In the context of pain management, it also means: "right now, my physical pain is under control with the current therapy".  General Information:  The scale should reflect your current level of pain. Unless you are specifically asked for the level of your worst pain, or your average pain. If you are asked for one of these two, then it should be understood that it is over the past 24 hours.  Levels 1 (one) through 5 (five) are described below, and can be treated as an outpatient. Ambulatory pain management facilities such as ours are more than adequate to treat these levels. Levels 6 (six) through 10 (ten) are also described below, however, these must be treated as a hospitalized patient. While levels 6 (six) and 7 (seven) may be evaluated at an urgent care facility, levels 8 (eight) through 10 (ten) constitute medical emergencies and as such, they belong in a hospital's emergency department. When having these levels (as described below), do not come to our office. Our facility is not equipped to manage these levels. Go directly to an urgent care facility or an emergency department to be evaluated.  Definitions:  Activities of Daily Living (ADL): Activities of daily living (ADL or ADLs) is a term used in healthcare to refer to people's daily self-care activities. Health professionals often use a person's ability or inability to perform ADLs as a measurement of their functional status, particularly in regard to people post injury, with disabilities and the elderly. There are two ADL levels: Basic and Instrumental. Basic Activities of Daily Living (BADL  or BADLs) consist of self-care   tasks that include: Bathing and showering; personal hygiene and grooming (including brushing/combing/styling hair); dressing; Toilet hygiene (getting to the toilet, cleaning oneself, and getting back up); eating and self-feeding (not  including cooking or chewing and swallowing); functional mobility, often referred to as "transferring", as measured by the ability to walk, get in and out of bed, and get into and out of a chair; the broader definition (moving from one place to another while performing activities) is useful for people with different physical abilities who are still able to get around independently. Basic ADLs include the things many people do when they get up in the morning and get ready to go out of the house: get out of bed, go to the toilet, bathe, dress, groom, and eat. On the average, loss of function typically follows a particular order. Hygiene is the first to go, followed by loss of toilet use and locomotion. The last to go is the ability to eat. When there is only one remaining area in which the person is independent, there is a 62.9% chance that it is eating and only a 3.5% chance that it is hygiene. Instrumental Activities of Daily Living (IADL or IADLs) are not necessary for fundamental functioning, but they let an individual live independently in a community. IADL consist of tasks that include: cleaning and maintaining the house; home establishment and maintenance; care of others (including selecting and supervising caregivers); care of pets; child rearing; managing money; managing financials (investments, etc.); meal preparation and cleanup; shopping for groceries and necessities; moving within the community; safety procedures and emergency responses; health management and maintenance (taking prescribed medications); and using the telephone or other form of communication.  Instructions:  Most patients tend to report their pain as a combination of two factors, their physical pain and their psychosocial pain. This last one is also known as "suffering" and it is reflection of how physical pain affects you socially and psychologically. From now on, report them separately.  From this point on, when asked to report  your pain level, report only your physical pain. Use the following table for reference.  Pain Clinic Pain Levels (0-5/10)  Pain Level Score  Description  No Pain 0   Mild pain 1 Nagging, annoying, but does not interfere with basic activities of daily living (ADL). Patients are able to eat, bathe, get dressed, toileting (being able to get on and off the toilet and perform personal hygiene functions), transfer (move in and out of bed or a chair without assistance), and maintain continence (able to control bladder and bowel functions). Blood pressure and heart rate are unaffected. A normal heart rate for a healthy adult ranges from 60 to 100 bpm (beats per minute).   Mild to moderate pain 2 Noticeable and distracting. Impossible to hide from other people. More frequent flare-ups. Still possible to adapt and function close to normal. It can be very annoying and may have occasional stronger flare-ups. With discipline, patients may get used to it and adapt.   Moderate pain 3 Interferes significantly with activities of daily living (ADL). It becomes difficult to feed, bathe, get dressed, get on and off the toilet or to perform personal hygiene functions. Difficult to get in and out of bed or a chair without assistance. Very distracting. With effort, it can be ignored when deeply involved in activities.   Moderately severe pain 4 Impossible to ignore for more than a few minutes. With effort, patients may still be able to manage work or participate in   some social activities. Very difficult to concentrate. Signs of autonomic nervous system discharge are evident: dilated pupils (mydriasis); mild sweating (diaphoresis); sleep interference. Heart rate becomes elevated (>115 bpm). Diastolic blood pressure (lower number) rises above 100 mmHg. Patients find relief in laying down and not moving.   Severe pain 5 Intense and extremely unpleasant. Associated with frowning face and frequent crying. Pain overwhelms the  senses.  Ability to do any activity or maintain social relationships becomes significantly limited. Conversation becomes difficult. Pacing back and forth is common, as getting into a comfortable position is nearly impossible. Pain wakes you up from deep sleep. Physical signs will be obvious: pupillary dilation; increased sweating; goosebumps; brisk reflexes; cold, clammy hands and feet; nausea, vomiting or dry heaves; loss of appetite; significant sleep disturbance with inability to fall asleep or to remain asleep. When persistent, significant weight loss is observed due to the complete loss of appetite and sleep deprivation.  Blood pressure and heart rate becomes significantly elevated. Caution: If elevated blood pressure triggers a pounding headache associated with blurred vision, then the patient should immediately seek attention at an urgent or emergency care unit, as these may be signs of an impending stroke.    Emergency Department Pain Levels (6-10/10)  Emergency Room Pain 6 Severely limiting. Requires emergency care and should not be seen or managed at an outpatient pain management facility. Communication becomes difficult and requires great effort. Assistance to reach the emergency department may be required. Facial flushing and profuse sweating along with potentially dangerous increases in heart rate and blood pressure will be evident.   Distressing pain 7 Self-care is very difficult. Assistance is required to transport, or use restroom. Assistance to reach the emergency department will be required. Tasks requiring coordination, such as bathing and getting dressed become very difficult.   Disabling pain 8 Self-care is no longer possible. At this level, pain is disabling. The individual is unable to do even the most "basic" activities such as walking, eating, bathing, dressing, transferring to a bed, or toileting. Fine motor skills are lost. It is difficult to think clearly.   Incapacitating pain  9 Pain becomes incapacitating. Thought processing is no longer possible. Difficult to remember your own name. Control of movement and coordination are lost.   The worst pain imaginable 10 At this level, most patients pass out from pain. When this level is reached, collapse of the autonomic nervous system occurs, leading to a sudden drop in blood pressure and heart rate. This in turn results in a temporary and dramatic drop in blood flow to the brain, leading to a loss of consciousness. Fainting is one of the body's self defense mechanisms. Passing out puts the brain in a calmed state and causes it to shut down for a while, in order to begin the healing process.    Summary: 1.   Refer to this scale when providing us with your pain level. 2.   Be accurate and careful when reporting your pain level. This will help with your care. 3.   Over-reporting your pain level will lead to loss of credibility. 4.   Even a level of 1/10 means that there is pain and will be treated at our facility. 5.   High, inaccurate reporting will be documented as "Symptom Exaggeration", leading to loss of credibility and suspicions of possible secondary gains such as obtaining more narcotics, or wanting to appear disabled, for fraudulent reasons. 6.   Only pain levels of 5 or below will be seen   at our facility. 7.   Pain levels of 6 and above will be sent to the Emergency Department and the appointment cancelled.  ______________________________________________________________________________________________     

## 2019-11-23 NOTE — Telephone Encounter (Signed)
She called in to say there are things in her chart that are inaccurate and she wants removed. She states she has never smoked and she doesn't drink alcohol but both are in her h & p stating she is a current user. Please  Remove. Thanks

## 2019-11-24 LAB — COMPLIANCE DRUG ANALYSIS, UR

## 2019-11-27 LAB — COMP. METABOLIC PANEL (12)
AST: 24 IU/L (ref 0–40)
Albumin/Globulin Ratio: 1.9 (ref 1.2–2.2)
Albumin: 4.4 g/dL (ref 3.8–4.9)
Alkaline Phosphatase: 75 IU/L (ref 48–121)
BUN/Creatinine Ratio: 20 (ref 9–23)
BUN: 15 mg/dL (ref 6–24)
Bilirubin Total: <0.2 mg/dL (ref 0.0–1.2)
Calcium: 9.9 mg/dL (ref 8.7–10.2)
Chloride: 102 mmol/L (ref 96–106)
Creatinine, Ser: 0.76 mg/dL (ref 0.57–1.00)
GFR calc Af Amer: 99 mL/min/{1.73_m2} (ref >59)
GFR calc non Af Amer: 86 mL/min/{1.73_m2} (ref >59)
Globulin, Total: 2.3 g/dL (ref 1.5–4.5)
Glucose: 92 mg/dL (ref 65–99)
Potassium: 4.5 mmol/L (ref 3.5–5.2)
Sodium: 140 mmol/L (ref 134–144)
Total Protein: 6.7 g/dL (ref 6.0–8.5)

## 2019-11-27 LAB — C-REACTIVE PROTEIN: CRP: 2 mg/L (ref 0–10)

## 2019-11-27 LAB — 25-HYDROXY VITAMIN D LCMS D2+D3
25-Hydroxy, Vitamin D-2: <1 ng/mL
25-Hydroxy, Vitamin D-3: 54 ng/mL
25-Hydroxy, Vitamin D: 54 ng/mL

## 2019-11-27 LAB — SEDIMENTATION RATE: Sed Rate: 14 mm/hr (ref 0–40)

## 2019-11-27 LAB — MAGNESIUM: Magnesium: 1.9 mg/dL (ref 1.6–2.3)

## 2019-11-27 LAB — VITAMIN B12: Vitamin B-12: 809 pg/mL (ref 232–1245)

## 2019-11-29 ENCOUNTER — Ambulatory Visit (INDEPENDENT_AMBULATORY_CARE_PROVIDER_SITE_OTHER): Payer: BC Managed Care – PPO | Admitting: Physician Assistant

## 2019-11-29 ENCOUNTER — Ambulatory Visit
Admission: RE | Admit: 2019-11-29 | Discharge: 2019-11-29 | Disposition: A | Discharge status: Home / Self Care | Payer: BC Managed Care – PPO | Attending: Physician Assistant | Admitting: Physician Assistant

## 2019-11-29 ENCOUNTER — Other Ambulatory Visit: Payer: Self-pay

## 2019-11-29 ENCOUNTER — Encounter: Payer: Self-pay | Admitting: Physician Assistant

## 2019-11-29 ENCOUNTER — Ambulatory Visit
Admission: RE | Admit: 2019-11-29 | Discharge: 2019-11-29 | Disposition: A | Discharge status: Home / Self Care | Payer: BC Managed Care – PPO | Source: Ambulatory Visit | Attending: Physician Assistant | Admitting: Physician Assistant

## 2019-11-29 VITALS — BP 128/73 | HR 102 | Ht 64.0 in | Wt 200.0 lb

## 2019-11-29 DIAGNOSIS — Z87442 Personal history of urinary calculi: Secondary | ICD-10-CM

## 2019-11-29 DIAGNOSIS — R109 Unspecified abdominal pain: Secondary | ICD-10-CM

## 2019-11-29 DIAGNOSIS — N200 Calculus of kidney: Secondary | ICD-10-CM

## 2019-11-29 MED ORDER — ONDANSETRON HCL 4 MG PO TABS: 4.0000 mg | ORAL_TABLET | Freq: Three times a day (TID) | ORAL | 0 refills | Status: AC | PRN

## 2019-11-29 MED ORDER — TAMSULOSIN HCL 0.4 MG PO CAPS: 0.4000 mg | ORAL_CAPSULE | Freq: Every day | ORAL | 0 refills | Status: DC

## 2019-11-29 MED ORDER — TRAMADOL HCL 50 MG PO TABS: 50.0000 mg | ORAL_TABLET | Freq: Four times a day (QID) | ORAL | 0 refills | Status: DC | PRN

## 2019-11-29 NOTE — Progress Notes (Signed)
11/29/2019 6:13 PM   Megan [name] 1960-11-05 [id number]  CC: Chief Complaint  Patient presents with  . Flank Pain    stone?   HPI: Megan [name] is a 59 y.o. female with PMH medullary sponge kidney, recurrent nephrolithiasis s/p right URS/LL/stent with Dr. Apolinar Junes on 06/29/2019, and recurrent UTI who presents today for evaluation of possible acute stone episode.  Today she reports a 4-week history of evolving urinary symptoms that began with right flank pain followed by urinary incontinence over the past week and shooting RLQ pains over the last day.  She describes her current pain as 8 out of 10 in severity that has been gradually increasing throughout the day.  She has taken Advil for symptom relief and notes that this only "takes the edge off."  Additionally, she reports chills 1 week ago, vomiting 2 days ago, and ongoing nausea.  She denies fever, but states she has not been taking her temperature.  Per CT stone study dated 06/26/2019, she was noted to have multiple bilateral nonobstructing kidney stones, largest on the right measuring 5 mm.  She subsequently underwent right URS/LL/stent placement with Dr. Apolinar Junes to treat a distal right ureteral stone and multiple right renal stones.  Follow-up renal ultrasound dated 08/02/2019 with no right-sided stones visualized.  KUB today with no clear right-sided ureteral stones.  In-office UA today positive for trace-intact blood; urine microscopy pan negative.   PMH: Past Medical History:  Diagnosis Date  . Asthma   . Diabetes mellitus without complication (HCC)   . Hypertension   . Thyroid disease    Surgical History: Past Surgical History:  Procedure Laterality Date  . ABDOMINAL HYSTERECTOMY    . APPENDECTOMY    . CYSTOSCOPY/URETEROSCOPY/HOLMIUM LASER/STENT PLACEMENT Right 06/29/2019   Procedure: CYSTOSCOPY/URETEROSCOPY/HOLMIUM LASER/STENT PLACEMENT;  Surgeon: Vanna Scotland, MD;  Location: [facility] ORS;  Service: Urology;  Laterality:  Right;  . FEMORAL OSTEOTOMY W/ APPLICATION EXTERNAL FIXATOR Right   . ROTATOR CUFF REPAIR      Home Medications:  Allergies as of 11/29/2019      Reactions   Latex Other (See Comments), Rash   Blisters  Blisters Blisters    Morphine Anaphylaxis, Shortness Of Breath, Nausea And Vomiting, Swelling, Other (See Comments)   Ha's and facial swelling   Nickel Swelling, Rash   Petrolatum Rash, Other (See Comments)   Blister All petroleum products per patient, facial swelling   Tape Rash   Pt can use paper tape   Erythromycin Nausea Only, Nausea And Vomiting   Nausea/vomiting   Erythromycin Ethylsuccinate Nausea And Vomiting   Hydromorphone Hcl Other (See Comments)   Bad headache   Other Other (See Comments)   Generic medications per patient- redness and burning in gums   Sulfa Antibiotics Rash         Medication List       Accurate as of November 29, 2019  6:13 PM. If you have any questions, ask your nurse or doctor.        albuterol 108 (90 Base) MCG/ACT inhaler Commonly known as: VENTOLIN HFA Inhale 1-2 puffs into the lungs every 6 (six) hours as needed for wheezing or shortness of breath.   amLODipine 10 MG tablet Commonly known as: NORVASC Take 10 mg by mouth daily.   carbamazepine 200 MG tablet Commonly known as: TEGRETOL Take 200 mg by mouth at bedtime.   celecoxib 200 MG capsule Commonly known as: CELEBREX Take 200 mg by mouth 2 (two) times daily.   fluticasone 50  MCG/ACT nasal spray Commonly known as: FLONASE Place 2 sprays into both nostrils in the morning and at bedtime.   Fluticasone-Salmeterol 250-50 MCG/DOSE Aepb Commonly known as: ADVAIR Inhale 1 puff into the lungs 2 (two) times daily.   gabapentin 300 MG capsule Commonly known as: NEURONTIN Take 300 mg by mouth 3 (three) times daily.   hydrochlorothiazide 25 MG tablet Commonly known as: HYDRODIURIL Take 25 mg by mouth daily as needed (kidney function).   levothyroxine 50 MCG tablet Commonly  known as: SYNTHROID Take 50 mcg by mouth daily before breakfast.   losartan 25 MG tablet Commonly known as: Cozaar Take 1 tablet (25 mg total) by mouth daily. Please follow up with your PCP within 2 weeks for labs and follow up blood pressures   meloxicam 7.5 MG tablet Commonly known as: MOBIC Take 7.5 mg by mouth 2 (two) times daily.   metFORMIN 500 MG 24 hr tablet Commonly known as: GLUCOPHAGE-XR Take 500 mg by mouth 2 (two) times daily with a meal.   montelukast 10 MG tablet Commonly known as: SINGULAIR Take 10 mg by mouth daily.   omeprazole 20 MG capsule Commonly known as: PRILOSEC Take 20 mg by mouth 2 (two) times daily before a meal.   ondansetron 4 MG tablet Commonly known as: Zofran Take 1 tablet (4 mg total) by mouth every 8 (eight) hours as needed for up to 14 days. Started by: Carman Ching, PA-C   tamsulosin 0.4 MG Caps capsule Commonly known as: FLOMAX Take 1 capsule (0.4 mg total) by mouth daily. Started by: Carman Ching, PA-C   traMADol 50 MG tablet Commonly known as: ULTRAM Take 1 tablet (50 mg total) by mouth every 6 (six) hours as needed.   Vitamin D3 75 MCG (3000 UT) Tabs Take 3,000 Units by mouth daily.   Vivelle-Dot 0.05 MG/24HR patch Generic drug: estradiol Place 1 patch onto the skin every 3 (three) days.       Allergies:  Allergies  Allergen Reactions  . Latex Other (See Comments) and Rash    Blisters  Blisters Blisters    . Morphine Anaphylaxis, Shortness Of Breath, Nausea And Vomiting, Swelling and Other (See Comments)    Ha's and facial swelling   . Nickel Swelling and Rash  . Petrolatum Rash and Other (See Comments)    Blister All petroleum products per patient, facial swelling   . Tape Rash    Pt can use paper tape  . Erythromycin Nausea Only and Nausea And Vomiting    Nausea/vomiting   . Erythromycin Ethylsuccinate Nausea And Vomiting  . Hydromorphone Hcl Other (See Comments)    Bad headache  .  Other Other (See Comments)    Generic medications per patient- redness and burning in gums   . Sulfa Antibiotics Rash         Family History: No family history on file.  Social History:   reports that she has been smoking. She has never used smokeless tobacco. She reports current alcohol use. She reports that she does not use drugs.  Physical Exam: BP 128/73   Pulse (!) 102   Ht 5\' 4"  (1.626 m)   Wt 200 lb (90.7 kg)   BMI 34.33 kg/m   Constitutional:  Alert and oriented, no acute distress, nontoxic appearing HEENT: [place], AT Cardiovascular: No clubbing, cyanosis, or edema Respiratory: Normal respiratory effort, no increased work of breathing Skin: No rashes, bruises or suspicious lesions Neurologic: Grossly intact, no focal deficits, moving all 4 extremities Psychiatric:  Normal mood and affect  Laboratory Data: Results for orders placed or performed in visit on 11/29/19  CULTURE, URINE COMPREHENSIVE   Specimen: Urine   UR  Result Value Ref Range   Urine Culture, Comprehensive Final report    Organism ID, Bacteria Comment   Microscopic Examination   Urine  Result Value Ref Range   WBC, UA 0-5 0 - 5 /hpf   RBC 0-2 0 - 2 /hpf   Epithelial Cells (non renal) 0-10 0 - 10 /hpf   Bacteria, UA Few None seen/Few  Urinalysis, Complete  Result Value Ref Range   Specific Gravity, UA 1.015 1.005 - 1.030   pH, UA 6.0 5.0 - 7.5   Color, UA Yellow Yellow   Appearance Ur Clear Clear   Leukocytes,UA Negative Negative   Protein,UA Negative Negative/Trace   Glucose, UA Negative Negative   Ketones, UA Negative Negative   RBC, UA Trace (A) Negative   Bilirubin, UA Negative Negative   Urobilinogen, Ur 0.2 0.2 - 1.0 mg/dL   Nitrite, UA Negative Negative   Microscopic Examination See below:    Pertinent Imaging: KUB, 11/29/2019: CLINICAL DATA:  Right flank pain, urinary calculi, polyuria, incontinence  EXAM: ABDOMEN - 1 VIEW  COMPARISON:  06/23/2019  FINDINGS: 2 supine  frontal views of the abdomen and pelvis are obtained. Probable punctate calculi overlying the right kidney, largest measuring 3 mm over the lower aspect of the right renal silhouette. Multiple left renal calculi again identified measuring up to 4 mm, stable. Stable phleboliths within the pelvis. No definitive ureteral calculi. Bowel gas pattern is unremarkable. Lung bases are clear. No acute bony abnormalities.  IMPRESSION: 1. Punctate residual right renal calculi, decreased in size and conspicuity since previous exam. 2. Stable left renal calculi.   Electronically Signed   By: Sharlet Salina M.D.   On: 11/30/2019 13:37  I personally reviewed the images referenced above and note no apparent ureteral stones.  Assessment & Plan:   1. Flank pain with history of urolithiasis 59 year old female with a history of recurrent nephrolithiasis most recently undergoing right ureteroscopy 5 months ago presents with a 3-week history of progressive right-sided urinary symptoms including right flank pain, urinary leakage, urgency, frequency, and RLQ pain accompanied by nausea, vomiting, and chills.  KUB without clear ureteral stone.  UA reassuring for infection today, will send for culture for further evaluation.  I recommend further imaging with a CT stone study and starting her on empiric Flomax for possible acute stone episode today.  Patient prefers to defer CT until tomorrow, I am in agreement with this plan due to her stable vitals today in clinic.  Also prescribing Zofran and tramadol for nausea and pain relief as needed pending CT results.  Counseled patient that I would contact her with her CT results to discuss neck steps.  She expressed understanding. - Urinalysis, Complete - CT RENAL STONE STUDY - tamsulosin (FLOMAX) 0.4 MG CAPS capsule; Take 1 capsule (0.4 mg total) by mouth daily.  Dispense: 30 capsule; Refill: 0 - CULTURE, URINE COMPREHENSIVE - ondansetron (ZOFRAN) 4 MG tablet; Take 1  tablet (4 mg total) by mouth every 8 (eight) hours as needed for up to 14 days.  Dispense: 20 tablet; Refill: 0 - traMADol (ULTRAM) 50 MG tablet; Take 1 tablet (50 mg total) by mouth every 6 (six) hours as needed.  Dispense: 4 tablet; Refill: 0   Return for will call to schedule CT scan and call with results.  Carman Ching, PA-C  [facility] [address], [place], [place] [address] [phone]

## 2019-12-01 LAB — URINALYSIS, COMPLETE
Bilirubin, UA: NEGATIVE
Glucose, UA: NEGATIVE
Ketones, UA: NEGATIVE
Leukocytes,UA: NEGATIVE
Nitrite, UA: NEGATIVE
Protein,UA: NEGATIVE
Specific Gravity, UA: 1.015 (ref 1.005–1.030)
Urobilinogen, Ur: 0.2 mg/dL (ref 0.2–1.0)
pH, UA: 6 (ref 5.0–7.5)

## 2019-12-01 LAB — MICROSCOPIC EXAMINATION

## 2019-12-03 LAB — CULTURE, URINE COMPREHENSIVE

## 2019-12-18 ENCOUNTER — Ambulatory Visit: Payer: BC Managed Care – PPO | Admitting: Pain Medicine

## 2020-01-02 ENCOUNTER — Emergency Department
Admission: EM | Admit: 2020-01-02 | Discharge: 2020-01-02 | Disposition: A | Discharge status: Home / Self Care | Payer: BC Managed Care – PPO | Attending: Emergency Medicine | Admitting: Emergency Medicine

## 2020-01-02 ENCOUNTER — Other Ambulatory Visit: Payer: Self-pay

## 2020-01-02 ENCOUNTER — Emergency Department: Payer: BC Managed Care – PPO

## 2020-01-02 DIAGNOSIS — Z5321 Procedure and treatment not carried out due to patient leaving prior to being seen by health care provider: Secondary | ICD-10-CM | POA: Diagnosis not present

## 2020-01-02 DIAGNOSIS — J029 Acute pharyngitis, unspecified: Secondary | ICD-10-CM | POA: Diagnosis not present

## 2020-01-02 DIAGNOSIS — I10 Essential (primary) hypertension: Secondary | ICD-10-CM | POA: Insufficient documentation

## 2020-01-02 DIAGNOSIS — R1110 Vomiting, unspecified: Secondary | ICD-10-CM | POA: Insufficient documentation

## 2020-01-02 DIAGNOSIS — R197 Diarrhea, unspecified: Secondary | ICD-10-CM | POA: Diagnosis not present

## 2020-01-02 HISTORY — DX: Disorder of kidney and ureter, unspecified: N28.9

## 2020-01-02 LAB — PROTIME-INR
INR: 0.9 (ref 0.8–1.2)
Prothrombin Time: 11.6 s (ref 11.4–15.2)

## 2020-01-02 LAB — COMPREHENSIVE METABOLIC PANEL WITH GFR
ALT: 22 U/L (ref 0–44)
AST: 20 U/L (ref 15–41)
Albumin: 4.4 g/dL (ref 3.5–5.0)
Alkaline Phosphatase: 70 U/L (ref 38–126)
Anion gap: 11 (ref 5–15)
BUN: 13 mg/dL (ref 6–20)
CO2: 25 mmol/L (ref 22–32)
Calcium: 9.3 mg/dL (ref 8.9–10.3)
Chloride: 104 mmol/L (ref 98–111)
Creatinine, Ser: 0.6 mg/dL (ref 0.44–1.00)
GFR calc Af Amer: >60 mL/min
GFR calc non Af Amer: >60 mL/min
Glucose, Bld: 112 mg/dL — ABNORMAL HIGH (ref 70–99)
Potassium: 3.8 mmol/L (ref 3.5–5.1)
Sodium: 140 mmol/L (ref 135–145)
Total Bilirubin: 0.7 mg/dL (ref 0.3–1.2)
Total Protein: 7.5 g/dL (ref 6.5–8.1)

## 2020-01-02 LAB — DIFFERENTIAL
Abs Immature Granulocytes: 0.04 K/uL (ref 0.00–0.07)
Basophils Absolute: 0.1 K/uL (ref 0.0–0.1)
Basophils Relative: 1 %
Eosinophils Absolute: 0.1 K/uL (ref 0.0–0.5)
Eosinophils Relative: 2 %
Immature Granulocytes: 1 %
Lymphocytes Relative: 23 %
Lymphs Abs: 1.6 K/uL (ref 0.7–4.0)
Monocytes Absolute: 0.6 K/uL (ref 0.1–1.0)
Monocytes Relative: 9 %
Neutro Abs: 4.6 K/uL (ref 1.7–7.7)
Neutrophils Relative %: 64 %

## 2020-01-02 LAB — CBC
HCT: 41.4 % (ref 36.0–46.0)
Hemoglobin: 14.2 g/dL (ref 12.0–15.0)
MCH: 33 pg (ref 26.0–34.0)
MCHC: 34.3 g/dL (ref 30.0–36.0)
MCV: 96.3 fL (ref 80.0–100.0)
Platelets: 305 10*3/uL (ref 150–400)
RBC: 4.3 MIL/uL (ref 3.87–5.11)
RDW: 13.5 % (ref 11.5–15.5)
WBC: 7 10*3/uL (ref 4.0–10.5)
nRBC: 0 % (ref 0.0–0.2)

## 2020-01-02 LAB — APTT: aPTT: 26 s (ref 24–36)

## 2020-01-02 NOTE — ED Triage Notes (Addendum)
Pt to ED from [facility]. Went in for "flu sx" such as sore throat, headache, diarrhea and vomiting but they found her BP was high and sent her over. D/t vomiting pt has not been able to keep her BP medications down.  No unilateral weakness noted, speech clear. PT alert and oriented. TX headache with tylenol and ibu, which only takes the edge off, has been going on since last Monday.

## 2020-01-02 NOTE — ED Notes (Signed)
Pt had an epidural in her back on Dec 15, 2019

## 2020-01-03 ENCOUNTER — Ambulatory Visit: Payer: BC Managed Care – PPO | Admitting: Pain Medicine

## 2020-01-03 ENCOUNTER — Telehealth: Payer: Self-pay | Admitting: Emergency Medicine

## 2020-01-03 NOTE — Telephone Encounter (Signed)
Called patient due to lwot to inquire about condition and follow up plans. Left message.   

## 2020-01-18 LAB — COLOGUARD: COLOGUARD: NEGATIVE

## 2020-02-01 NOTE — Progress Notes (Signed)
02/02/2020 3:52 PM   [name] [birth year] [id number]  Referring provider: Mick Sell, MD [address] [place],  [place] [address] Chief Complaint  Patient presents with  . Nephrolithiasis    66mo w/KUB    HPI: Mishayla Sliwinski is a 59 y.o. female who returns for a 6 month follow up of history of recurrent nephrolithiasis.   She has a history of 10+ procedures both electively and for treatment of septic obstructing stones.  There is some concern about development of bilateral ureteral stricture given the numerous procedures.  She is had ESWL in the very remote past but none in the recent past.  She underwent bilateral ureteroscopy in 05/2017.   CT renal stone study from 06/26/2019 noted multiple bilateral nonobstructing kidney stones, largest on the right measuring 5 mm.  She is s/p right ureteroscopy on 06/29/2019. Unremarkable retrograde pyelogram without evidence of hydroureteronephrosis.  Very small stone identified within distal ureter, dislodged with endoscopic manipulation.  Nonobstructing upper tract stones also treated including a few Randall's plaques.  Uncomplicated.  Stent placed on tether.  Follow up renal US from 08/02/2019 showed no right-sided stones visualized.  Patient last saw Carman Ching, PA-C on 11/29/2019. She had right flank pain with urinary incontinence x 4 weeks. She had chills, vomiting and nausea. KUB showed no clear right-sided ureteral stones. Microscopic UA and urine culture were negative. Patient was recommended to take flomax, zofran and tramadol prn.   She has been having RLQ pain and urinary urge incontinence on and off since 11/2019. She has had incontinence since 06/2019.  She felt like she had another stone back in 11/2019. She is staying hydrated and drinking plenty fluids. She is on a low oxalate diet.   She reports back, hip and knee pain. She can barely walk down the steps now. She would like to have total knee  replacement.   She will have bariatric surgery.   KUB shows punctate stone in the right kidney, no left kidney stone, no ureteral calculi. Imaging is unchanged in comparison to 11/2019.   PMH: Past Medical History:  Diagnosis Date  . Asthma   . Hypertension   . Renal disorder   . Thyroid disease     Surgical History: Past Surgical History:  Procedure Laterality Date  . ABDOMINAL HYSTERECTOMY    . APPENDECTOMY    . CYSTOSCOPY/URETEROSCOPY/HOLMIUM LASER/STENT PLACEMENT Right 06/29/2019   Procedure: CYSTOSCOPY/URETEROSCOPY/HOLMIUM LASER/STENT PLACEMENT;  Surgeon: Vanna Scotland, MD;  Location: [facility] ORS;  Service: Urology;  Laterality: Right;  . FEMORAL OSTEOTOMY W/ APPLICATION EXTERNAL FIXATOR Right   . LITHOTRIPSY    . ROTATOR CUFF REPAIR      Home Medications:  Allergies as of 02/02/2020      Reactions   Latex Other (See Comments), Rash   Blisters  Blisters Blisters    Morphine Anaphylaxis, Shortness Of Breath, Nausea And Vomiting, Swelling, Other (See Comments)   Ha's and facial swelling   Nickel Swelling, Rash   Petrolatum Rash, Other (See Comments)   Blister All petroleum products per patient, facial swelling   Tape Rash   Pt can use paper tape   Erythromycin Nausea Only, Nausea And Vomiting   Nausea/vomiting   Erythromycin Ethylsuccinate Nausea And Vomiting   Hydromorphone Hcl Other (See Comments)   Bad headache   Other Other (See Comments)   Generic medications per patient- redness and burning in gums   Sulfa Antibiotics Rash         Medication List  Accurate as of February 02, 2020 11:59 PM. If you have any questions, ask your nurse or doctor.        STOP taking these medications   amLODipine 10 MG tablet Commonly known as: NORVASC Stopped by: Vanna ScotlandAshley Aijalon Demuro, MD   meloxicam 7.5 MG tablet Commonly known as: MOBIC Stopped by: Vanna ScotlandAshley Constantinos Krempasky, MD   tamsulosin 0.4 MG Caps capsule Commonly known as: FLOMAX Stopped by: Vanna ScotlandAshley Allahna Husband, MD     traMADol 50 MG tablet Commonly known as: ULTRAM Stopped by: Vanna ScotlandAshley Nashalie Sallis, MD     TAKE these medications   albuterol 108 (90 Base) MCG/ACT inhaler Commonly known as: VENTOLIN HFA Inhale 1-2 puffs into the lungs every 6 (six) hours as needed for wheezing or shortness of breath.   carbamazepine 200 MG tablet Commonly known as: TEGRETOL Take 200 mg by mouth at bedtime.   celecoxib 200 MG capsule Commonly known as: CELEBREX Take 200 mg by mouth 2 (two) times daily.   fluticasone 50 MCG/ACT nasal spray Commonly known as: FLONASE Place 2 sprays into both nostrils in the morning and at bedtime.   Fluticasone-Salmeterol 250-50 MCG/DOSE Aepb Commonly known as: ADVAIR Inhale 1 puff into the lungs 2 (two) times daily.   gabapentin 300 MG capsule Commonly known as: NEURONTIN Take 300 mg by mouth 3 (three) times daily.   hydrALAZINE 50 MG tablet Commonly known as: APRESOLINE Take 50 mg by mouth 2 (two) times daily.   hydrochlorothiazide 25 MG tablet Commonly known as: HYDRODIURIL Take 25 mg by mouth daily as needed (kidney function).   levothyroxine 50 MCG tablet Commonly known as: SYNTHROID Take 50 mcg by mouth daily before breakfast.   losartan 25 MG tablet Commonly known as: Cozaar Take 1 tablet (25 mg total) by mouth daily. Please follow up with your PCP within 2 weeks for labs and follow up blood pressures   metFORMIN 500 MG 24 hr tablet Commonly known as: GLUCOPHAGE-XR Take 500 mg by mouth 2 (two) times daily with a meal.   montelukast 10 MG tablet Commonly known as: SINGULAIR Take 10 mg by mouth daily.   omeprazole 20 MG capsule Commonly known as: PRILOSEC Take 20 mg by mouth 2 (two) times daily before a meal.   Vitamin D3 75 MCG (3000 UT) Tabs Take 3,000 Units by mouth daily.   Vivelle-Dot 0.05 MG/24HR patch Generic drug: estradiol Place 1 patch onto the skin every 3 (three) days.       Allergies:  Allergies  Allergen Reactions  . Latex Other (See  Comments) and Rash    Blisters  Blisters Blisters    . Morphine Anaphylaxis, Shortness Of Breath, Nausea And Vomiting, Swelling and Other (See Comments)    Ha's and facial swelling   . Nickel Swelling and Rash  . Petrolatum Rash and Other (See Comments)    Blister All petroleum products per patient, facial swelling   . Tape Rash    Pt can use paper tape  . Erythromycin Nausea Only and Nausea And Vomiting    Nausea/vomiting   . Erythromycin Ethylsuccinate Nausea And Vomiting  . Hydromorphone Hcl Other (See Comments)    Bad headache  . Other Other (See Comments)    Generic medications per patient- redness and burning in gums   . Sulfa Antibiotics Rash         Family History: No family history on file.  Social History:  reports that she has quit smoking. She has never used smokeless tobacco. She reports previous alcohol use.  She reports that she does not use drugs.   Physical Exam: BP (!) 165/92   Pulse 80   Ht 5\' 2"  (1.575 m)   Wt 200 lb (90.7 kg)   BMI 36.58 kg/m   Constitutional:  Alert and oriented, No acute distress. HEENT: [place] AT, moist mucus membranes.  Trachea midline, no masses. Cardiovascular: No clubbing, cyanosis, or edema. Respiratory: Normal respiratory effort, no increased work of breathing. Skin: No rashes, bruises or suspicious lesions. Neurologic: Grossly intact, no focal deficits, moving all 4 extremities. Psychiatric: Normal mood and affect.  Laboratory Data:  Lab Results  Component Value Date   CREATININE 0.60 01/02/2020    Lab Results  Component Value Date   HGBA1C 5.5 06/28/2019    Urinalysis Results for orders placed or performed during the hospital encounter of 02/02/20  Urine culture   Specimen: Urine, Random  Result Value Ref Range   Specimen Description      URINE, RANDOM Performed at [facility], [address], [place], [place]    Special Requests      NONE Performed at [facility]  Lab, [address] [place], [place]    Culture (A)     <10,000 COLONIES/mL INSIGNIFICANT GROWTH Performed at [facility], [address], [place], [place]    Report Status 02/03/2020 FINAL   Urinalysis, Complete w Microscopic  Result Value Ref Range   Color, Urine YELLOW YELLOW   APPearance CLEAR CLEAR   Specific Gravity, Urine 1.020 1.005 - 1.030   pH 5.5 5.0 - 8.0   Glucose, UA NEGATIVE NEGATIVE mg/dL   Hgb urine dipstick NEGATIVE NEGATIVE   Bilirubin Urine NEGATIVE NEGATIVE   Ketones, ur NEGATIVE NEGATIVE mg/dL   Protein, ur NEGATIVE NEGATIVE mg/dL   Nitrite NEGATIVE NEGATIVE   Leukocytes,Ua NEGATIVE NEGATIVE   Squamous Epithelial / LPF 6-10 0 - 5   WBC, UA 6-10 0 - 5 WBC/hpf   RBC / HPF NONE SEEN 0 - 5 RBC/hpf   Bacteria, UA MANY (A) NONE SEEN   WBC Clumps PRESENT      Pertinent Imaging: CLINICAL DATA:  Kidney stones.  EXAM: ABDOMEN - 1 VIEW  COMPARISON:  11/29/2019  FINDINGS: Small stones project over the kidneys bilaterally measuring up to approximately 2-3 mm on the right and 4 mm on the left. There are calcifications projecting over the patient's pelvis that are stable from prior study. There are mild degenerative changes throughout the lumbar spine and bilateral hips. The bowel gas pattern is nonobstructive. The stool burden is moderate.  IMPRESSION: Bilateral nephrolithiasis, similar to prior study.   Electronically Signed   By: 01/29/2020 M.D.   On: 02/03/2020 20:28   I have personally reviewed the images and agree with radiologist interpretation.    Assessment & Plan:    1. History of recurrent nephrolithiasis disease KUB today shows punctate stone in the right kidney, no left kidney stone, no ureteral calculi. Imaging is unchanged in comparison to 11/2019.  We discussed general stone prevention techniques including drinking plenty water with goal of producing 2.5 L urine daily, increased citric acid  intake, avoidance of high oxalate containing foods, and decreased salt intake.  Information about dietary recommendations given today  RTC in 1 year for KUB.   2. RLQ pain Patient is concerned she may have a ureteral calculus.  Stone is not appreciated on KUB today.   Risk and warning signs were reviewed regarding the patient  having a ureter stone prior to bariatric surgery. Patient understood and will work with dietitian at [facility] to balance bariatric surgery and low oxalate diet.    Will having patient undergo a non contrast CT scan to rule out ureter calculus prior to bariatric surgery.   3. Urinary urge incontinence UA is contaminated today This could be related to a stone vs OAB symptoms.  Patient declined medication to treat OAB symptoms.  -Urine culture sent    [facility] [address] [place], [place] [address] [phone]  I, Theador Hawthorne, am acting as a scribe for Dr. Vanna Scotland.  I have reviewed the above documentation for accuracy and completeness, and I agree with the above.   Vanna Scotland, MD  I spent 30 total minutes on the day of the encounter including pre-visit review of the medical record, face-to-face time with the patient, and post visit ordering of labs/imaging/tests.

## 2020-02-02 ENCOUNTER — Ambulatory Visit
Admission: RE | Admit: 2020-02-02 | Discharge: 2020-02-02 | Disposition: A | Discharge status: Home / Self Care | Payer: BC Managed Care – PPO | Attending: Urology | Admitting: Urology

## 2020-02-02 ENCOUNTER — Other Ambulatory Visit: Payer: Self-pay

## 2020-02-02 ENCOUNTER — Ambulatory Visit
Admission: RE | Admit: 2020-02-02 | Discharge: 2020-02-02 | Disposition: A | Discharge status: Home / Self Care | Payer: BC Managed Care – PPO | Source: Ambulatory Visit | Attending: Urology | Admitting: Urology

## 2020-02-02 ENCOUNTER — Encounter: Payer: Self-pay | Admitting: Urology

## 2020-02-02 ENCOUNTER — Ambulatory Visit (INDEPENDENT_AMBULATORY_CARE_PROVIDER_SITE_OTHER): Payer: BC Managed Care – PPO | Admitting: Urology

## 2020-02-02 VITALS — BP 165/92 | HR 80 | Ht 62.0 in | Wt 200.0 lb

## 2020-02-02 DIAGNOSIS — R1084 Generalized abdominal pain: Secondary | ICD-10-CM

## 2020-02-02 DIAGNOSIS — N200 Calculus of kidney: Secondary | ICD-10-CM

## 2020-02-02 LAB — URINALYSIS, COMPLETE (UACMP) WITH MICROSCOPIC
Bilirubin Urine: NEGATIVE
Glucose, UA: NEGATIVE mg/dL
Hgb urine dipstick: NEGATIVE
Ketones, ur: NEGATIVE mg/dL
Leukocytes,Ua: NEGATIVE
Nitrite: NEGATIVE
Protein, ur: NEGATIVE mg/dL
RBC / HPF: NONE SEEN RBC/hpf (ref 0–5)
Specific Gravity, Urine: 1.02 (ref 1.005–1.030)
pH: 5.5 (ref 5.0–8.0)

## 2020-02-03 LAB — URINE CULTURE: Culture: <10000 — AB

## 2020-02-12 HISTORY — PX: LAPAROSCOPIC GASTRIC BYPASS: SUR771

## 2020-02-21 IMAGING — CR DG ABDOMEN 1V
2 series · 2 of 2 positions shown · non-contrast
Comparison: None.

CLINICAL DATA: Kidney stones.  Passed a stone 2 weeks ago.

EXAM:
ABDOMEN - 1 VIEW

[abdomen kub (1 of 2)]
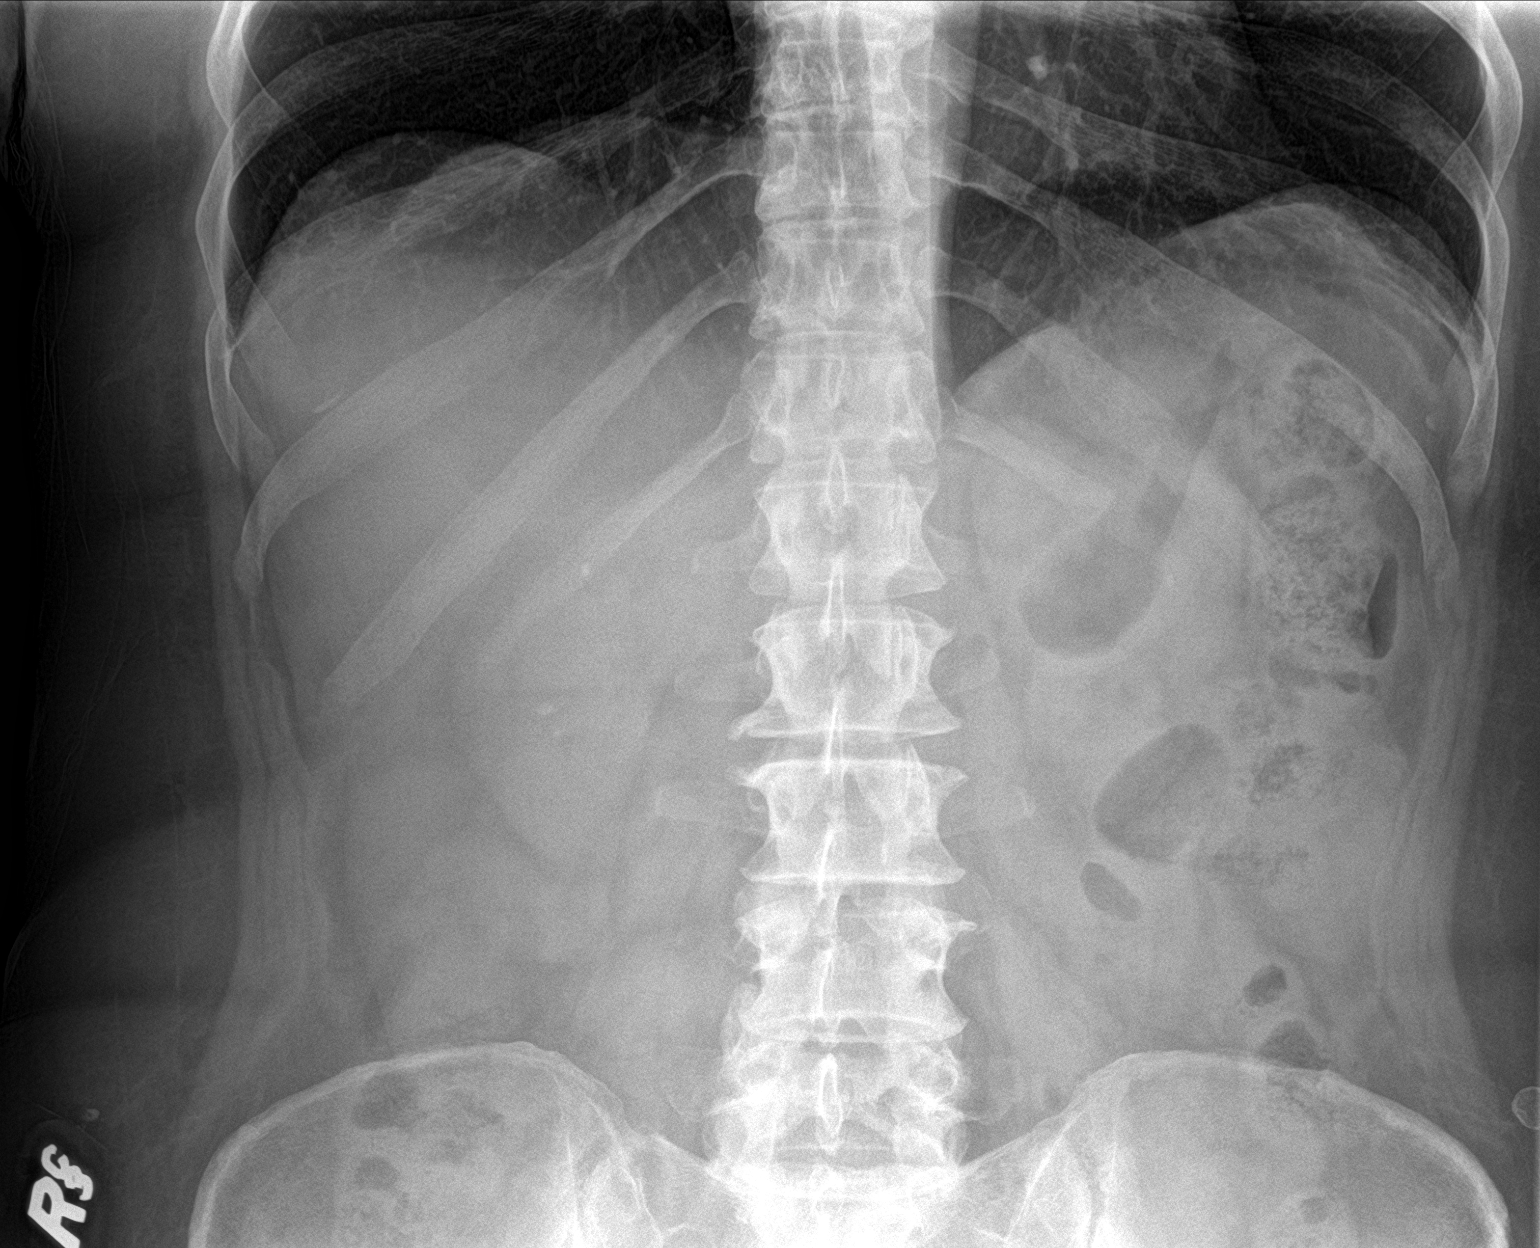

[abdomen kub (2 of 2)]
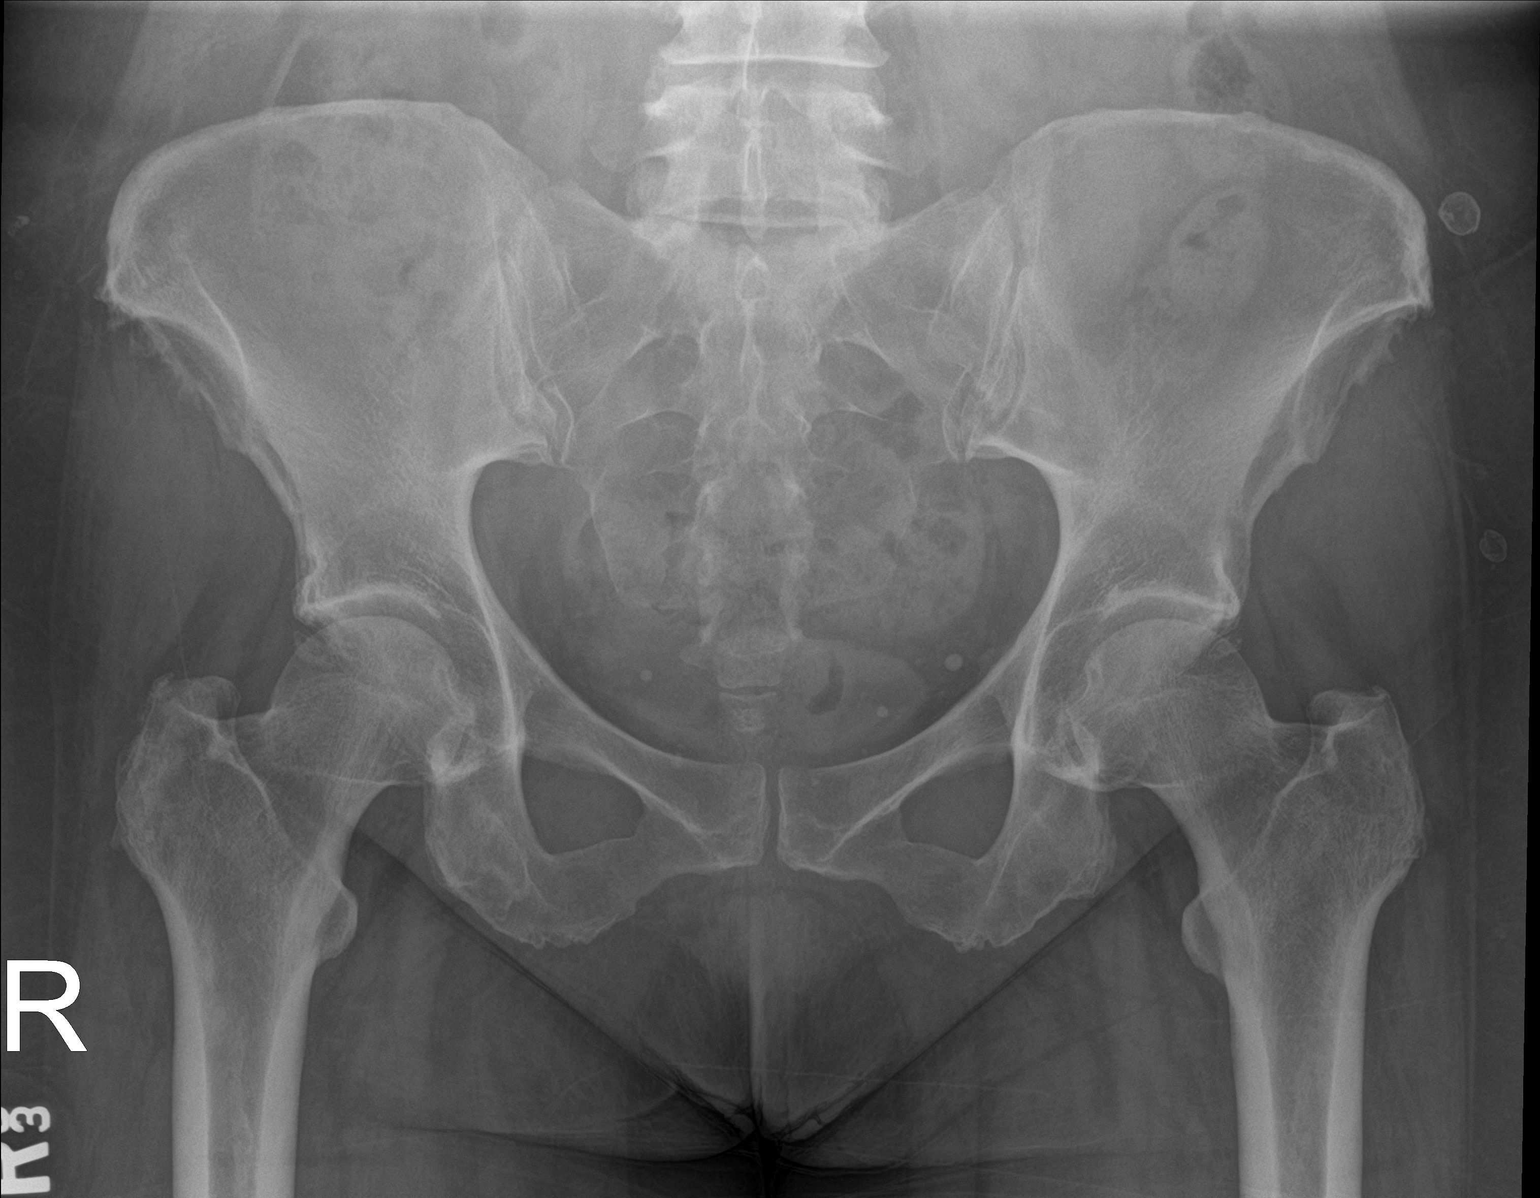

[2 of 2 positions shown; findings below may reference images not displayed]

FINDINGS: The bowel gas pattern is normal. Three small right renal calculi are
identified. Possible small left renal calculus. Phleboliths in the
pelvis. No acute osseous abnormality.
IMPRESSION: Bilateral nephrolithiasis.

## 2020-03-04 ENCOUNTER — Ambulatory Visit: Payer: BC Managed Care – PPO

## 2020-06-02 ENCOUNTER — Other Ambulatory Visit: Payer: Self-pay

## 2020-06-02 ENCOUNTER — Emergency Department: Payer: BC Managed Care – PPO

## 2020-06-02 ENCOUNTER — Emergency Department
Admission: EM | Admit: 2020-06-02 | Discharge: 2020-06-02 | Disposition: A | Discharge status: Home / Self Care | Payer: BC Managed Care – PPO | Attending: Emergency Medicine | Admitting: Emergency Medicine

## 2020-06-02 ENCOUNTER — Encounter: Payer: Self-pay | Admitting: Emergency Medicine

## 2020-06-02 DIAGNOSIS — E039 Hypothyroidism, unspecified: Secondary | ICD-10-CM | POA: Diagnosis not present

## 2020-06-02 DIAGNOSIS — Z87891 Personal history of nicotine dependence: Secondary | ICD-10-CM | POA: Diagnosis not present

## 2020-06-02 DIAGNOSIS — Z91040 Latex allergy status: Secondary | ICD-10-CM | POA: Diagnosis not present

## 2020-06-02 DIAGNOSIS — Z7951 Long term (current) use of inhaled steroids: Secondary | ICD-10-CM | POA: Insufficient documentation

## 2020-06-02 DIAGNOSIS — I10 Essential (primary) hypertension: Secondary | ICD-10-CM | POA: Diagnosis not present

## 2020-06-02 DIAGNOSIS — Z79899 Other long term (current) drug therapy: Secondary | ICD-10-CM | POA: Diagnosis not present

## 2020-06-02 DIAGNOSIS — J45909 Unspecified asthma, uncomplicated: Secondary | ICD-10-CM | POA: Insufficient documentation

## 2020-06-02 DIAGNOSIS — Z96651 Presence of right artificial knee joint: Secondary | ICD-10-CM | POA: Diagnosis not present

## 2020-06-02 DIAGNOSIS — R109 Unspecified abdominal pain: Secondary | ICD-10-CM | POA: Diagnosis present

## 2020-06-02 DIAGNOSIS — N201 Calculus of ureter: Secondary | ICD-10-CM | POA: Insufficient documentation

## 2020-06-02 LAB — CBC
HCT: 38.8 % (ref 36.0–46.0)
Hemoglobin: 13.1 g/dL (ref 12.0–15.0)
MCH: 31.6 pg (ref 26.0–34.0)
MCHC: 33.8 g/dL (ref 30.0–36.0)
MCV: 93.5 fL (ref 80.0–100.0)
Platelets: 243 10*3/uL (ref 150–400)
RBC: 4.15 MIL/uL (ref 3.87–5.11)
RDW: 13.6 % (ref 11.5–15.5)
WBC: 6.7 10*3/uL (ref 4.0–10.5)
nRBC: 0 % (ref 0.0–0.2)

## 2020-06-02 LAB — BASIC METABOLIC PANEL
Anion gap: 9 (ref 5–15)
BUN: 16 mg/dL (ref 6–20)
CO2: 24 mmol/L (ref 22–32)
Calcium: 9.3 mg/dL (ref 8.9–10.3)
Chloride: 107 mmol/L (ref 98–111)
Creatinine, Ser: 0.69 mg/dL (ref 0.44–1.00)
GFR, Estimated: >60 mL/min (ref >60)
Glucose, Bld: 113 mg/dL — ABNORMAL HIGH (ref 70–99)
Potassium: 4 mmol/L (ref 3.5–5.1)
Sodium: 140 mmol/L (ref 135–145)

## 2020-06-02 LAB — URINALYSIS, COMPLETE (UACMP) WITH MICROSCOPIC
Bilirubin Urine: NEGATIVE
Glucose, UA: NEGATIVE mg/dL
Hgb urine dipstick: NEGATIVE
Ketones, ur: 5 mg/dL — AB
Leukocytes,Ua: NEGATIVE
Nitrite: NEGATIVE
Protein, ur: NEGATIVE mg/dL
Specific Gravity, Urine: 1.018 (ref 1.005–1.030)
pH: 6 (ref 5.0–8.0)

## 2020-06-02 LAB — TROPONIN I (HIGH SENSITIVITY)
Troponin I (High Sensitivity): 3 ng/L (ref <18)
Troponin I (High Sensitivity): 3 ng/L (ref <18)

## 2020-06-02 MED ORDER — LACTATED RINGERS IV BOLUS
1000.0000 mL | Freq: Once | INTRAVENOUS | Status: AC
  Administered 2020-06-02: 1000 mL via INTRAVENOUS

## 2020-06-02 MED ORDER — OXYCODONE-ACETAMINOPHEN 5-325 MG PO TABS: 1.0000 | ORAL_TABLET | ORAL | 0 refills | Status: DC | PRN

## 2020-06-02 MED ORDER — KETOROLAC TROMETHAMINE 30 MG/ML IJ SOLN
15.0000 mg | Freq: Once | INTRAMUSCULAR | Status: AC
  Administered 2020-06-02: 15 mg via INTRAVENOUS
  Filled 2020-06-02: qty 1

## 2020-06-02 MED ORDER — ONDANSETRON HCL 4 MG/2ML IJ SOLN
4.0000 mg | Freq: Once | INTRAMUSCULAR | Status: AC
  Administered 2020-06-02: 4 mg via INTRAVENOUS
  Filled 2020-06-02: qty 2

## 2020-06-02 MED ORDER — ONDANSETRON 4 MG PO TBDP: 4.0000 mg | ORAL_TABLET | Freq: Three times a day (TID) | ORAL | 0 refills | Status: DC | PRN

## 2020-06-02 NOTE — ED Notes (Signed)
EDP at bedside  

## 2020-06-02 NOTE — ED Triage Notes (Signed)
Pt reports has had a kidney stone for the last 5 weeks that will not pass. Pt states continues to have pain to her right flank. Pt also reports has been having trouble keeping her BP down and now has some CP that is pressure like in nature and non radiating.

## 2020-06-02 NOTE — ED Provider Notes (Signed)
[facility] Emergency Department Provider Note   ____________________________________________   Event Date/Time   First MD Initiated Contact with Patient 06/02/20 1623     (approximate)  I have reviewed the triage vital signs and the nursing notes.   HISTORY  Chief Complaint Flank Pain, Chest Pain, and Hypertension    HPI Megan [name] is a 60 y.o. female with possible history of hypertension, asthma, kidney stones, diabetes, and chronic pain syndrome who presents to the ED complaining of flank and abdominal pain.  Patient reports that she has had a flareup and pain extending from her right flank to the right lower quadrant of her abdomen over the past 2 days.  She has noticed some hematuria but denies any fevers or dysuria.  She describes symptoms as similar to prior kidney stones and she was recently diagnosed with a nonobstructing stone in her right ureter.  She has been taking tramadol and Toradol at home without significant relief.  She is also concerned that her blood pressure has been running as high as 200 systolic at home, despite being compliant with her antihypertensives.  She denies any vision changes, speech changes, numbness, weakness, chest pain, shortness of breath, or decreased urine output.  She spoke with her urology team at [facility] earlier today, who recommended she come to the ED for further evaluation.        Past Medical History:  Diagnosis Date  . Asthma   . Hypertension   . Renal disorder   . Thyroid disease     Patient Active Problem List   Diagnosis Date Noted  . Derangement, knee internal 11/22/2019  . Chronic pain syndrome 11/22/2019  . Pharmacologic therapy 11/22/2019  . Disorder of skeletal system 11/22/2019  . Problems influencing health status 11/22/2019  . Abnormal MRI, lumbar spine (08/31/2019) 11/22/2019  . Chronic hip pain (2ry area of Pain) (Bilateral) (R>L) 11/22/2019  . Chronic lower extremity pain (3ry area of  Pain) (Bilateral) (R>L) 11/22/2019  . Chronic knee pain (4th area of Pain) (Bilateral) (R>L) 11/22/2019  . 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 Chronic ankle and foot pain (5th area of Pain) (Right) 11/22/2019  . Foot numbness (Left) 11/22/2019  . Chronic lumbar radiculitis (Bilateral) (Right: Pain) (Left: Numbness) 11/22/2019  . Lumbar facet arthropathy (Multilevel) (Bilateral) 11/22/2019  . Chronic low back pain (Bilateral) w/o sciatica 11/22/2019  . 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 DDD (degenerative disc disease), lumbosacral 11/22/2019  . Lumbar facet syndrome (Bilateral) 11/22/2019  . Lumbar foraminal stenosis (L2-3, L4-5) (Bilateral) 11/22/2019  . Lumbar lateral recess stenosis (L3-4) (Bilateral) 11/22/2019  . GERD (gastroesophageal reflux disease) 09/21/2019  . Obesity, Class II, BMI 35-39.9 09/21/2019  . Intractable lower abdominal pain 08/20/2019  . Ureterolithiasis 08/20/2019  . Chronic low back pain (1ry area of Pain) (Bilateral) (R>L) w/ sciatica (Bilateral) 08/20/2019  . Nephrolithiasis 06/28/2019  . Valgus deformity, not elsewhere classified, unspecified knee 02/01/2019  . Orthostatic hypotension 11/12/2018  . Arthritis of right knee 11/11/2018  . History of total knee replacement (Right) 11/11/2018  . History of maternal deep vein thrombosis (DVT) 11/11/2018  . Mild intermittent asthma 11/11/2018  . Prediabetes 11/02/2018  . Derangement of collateral ligament of right knee 12/25/2016  . Essential hypertension 11/01/2016  . Hypothyroidism 11/01/2016  . 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 Recurrent UTI 11/01/2016  . Calculus of kidney 03/26/2016  . Primary osteoarthritis of right knee 11/06/2015  . OA (osteoarthritis) of knee 11/04/2015  .  Dry eyes 03/28/2012  . Pain in eye 03/28/2012  . Trigeminal neuralgia 03/28/2012    Past Surgical History:  Procedure Laterality Date  . ABDOMINAL HYSTERECTOMY    . APPENDECTOMY    .  CYSTOSCOPY/URETEROSCOPY/HOLMIUM LASER/STENT PLACEMENT Right 06/29/2019   Procedure: CYSTOSCOPY/URETEROSCOPY/HOLMIUM LASER/STENT PLACEMENT;  Surgeon: Vanna Scotland, MD;  Location: [facility] ORS;  Service: Urology;  Laterality: Right;  . FEMORAL OSTEOTOMY W/ APPLICATION EXTERNAL FIXATOR Right   . LITHOTRIPSY    . ROTATOR CUFF REPAIR      Prior to Admission medications   Medication Sig Start Date End Date Taking? Authorizing Provider  ondansetron (ZOFRAN ODT) 4 MG disintegrating tablet Take 1 tablet (4 mg total) by mouth every 8 (eight) hours as needed for nausea or vomiting. 06/02/20  Yes Chesley Noon, MD  oxyCODONE-acetaminophen (PERCOCET) 5-325 MG tablet Take 1 tablet by mouth every 4 (four) hours as needed. 06/02/20 06/02/21 Yes Chesley Noon, MD  albuterol (PROVENTIL HFA;VENTOLIN HFA) 108 (90 Base) MCG/ACT inhaler Inhale 1-2 puffs into the lungs every 6 (six) hours as needed for wheezing or shortness of breath. 07/19/18   Maxwell Caul, PA-C  carbamazepine (TEGRETOL) 200 MG tablet Take 200 mg by mouth at bedtime.  02/12/19   [provider]  celecoxib (CELEBREX) 200 MG capsule Take 200 mg by mouth 2 (two) times daily.  03/04/16   [provider]  Cholecalciferol (VITAMIN D3) 75 MCG (3000 UT) TABS Take 3,000 Units by mouth daily.    [provider]  estradiol (VIVELLE-DOT) 0.05 MG/24HR patch Place 1 patch onto the skin every 3 (three) days.  09/29/15   [provider]  fluticasone (FLONASE) 50 MCG/ACT nasal spray Place 2 sprays into both nostrils in the morning and at bedtime.    [provider]  Fluticasone-Salmeterol (ADVAIR) 250-50 MCG/DOSE AEPB Inhale 1 puff into the lungs 2 (two) times daily.    [provider]  gabapentin (NEURONTIN) 300 MG capsule Take 300 mg by mouth 3 (three) times daily.    [provider]  hydrALAZINE (APRESOLINE) 50 MG tablet Take 50 mg by mouth 2 (two) times daily. 01/02/20   [provider]   hydrochlorothiazide (HYDRODIURIL) 25 MG tablet Take 25 mg by mouth daily as needed (kidney function).  06/28/17   [provider]  levothyroxine (SYNTHROID) 50 MCG tablet Take 50 mcg by mouth daily before breakfast.    [provider]  losartan (COZAAR) 25 MG tablet Take 1 tablet (25 mg total) by mouth daily. Please follow up with your PCP within 2 weeks for labs and follow up blood pressures 06/30/19 07/30/19  Zigmund Daniel., MD  metFORMIN (GLUCOPHAGE-XR) 500 MG 24 hr tablet Take 500 mg by mouth 2 (two) times daily with a meal.    [provider]  montelukast (SINGULAIR) 10 MG tablet Take 10 mg by mouth daily.  11/10/18   [provider]  omeprazole (PRILOSEC) 20 MG capsule Take 20 mg by mouth 2 (two) times daily before a meal.    [provider]    Allergies Latex, Morphine, Nickel, Petrolatum, Tape, Erythromycin, Erythromycin ethylsuccinate, Hydromorphone hcl, Other, and Sulfa antibiotics  No family history on file.  Social History Social History   Tobacco Use  . Smoking status: Former Games developer  . Smokeless tobacco: Never Used  Vaping Use  . Vaping Use: Never used  Substance Use Topics  . Alcohol use: Not Currently  . Drug use: Never    Review of Systems  Constitutional: No fever/chills  Eyes: No visual changes. ENT: No sore throat. Cardiovascular: Denies chest pain. Respiratory: Denies shortness of breath. Gastrointestinal: Positive for flank and abdominal pain.  No nausea, no vomiting.  No diarrhea.  No constipation. Genitourinary: Negative for dysuria. Musculoskeletal: Negative for back pain. Skin: Negative for rash. Neurological: Negative for headaches, focal weakness or numbness.  ____________________________________________   PHYSICAL EXAM:  VITAL SIGNS: ED Triage Vitals  Enc Vitals Group     BP 06/02/20 1423 (!) 191/108     Pulse Rate 06/02/20 1423 78     Resp 06/02/20 1423 20     Temp 06/02/20 1423 98.7 F  (37.1 C)     Temp Source 06/02/20 1423 Oral     SpO2 06/02/20 1423 98 %     Weight 06/02/20 1420 180 lb (81.6 kg)     Height 06/02/20 1420 5\' 4"  (1.626 m)     Head Circumference --      Peak Flow --      Pain Score 06/02/20 1420 10     Pain Loc --      Pain Edu? --      Excl. in GC? --     Constitutional: Alert and oriented. Eyes: Conjunctivae are normal. Head: Atraumatic. Nose: No congestion/rhinnorhea. Mouth/Throat: Mucous membranes are moist. Neck: Normal ROM Cardiovascular: Normal rate, regular rhythm. Grossly normal heart sounds. Respiratory: Normal respiratory effort.  No retractions. Lungs CTAB. Gastrointestinal: Soft and tender to palpation in the right lower quadrant of the abdomen with no rebound or guarding. No distention. Genitourinary: deferred Musculoskeletal: No lower extremity tenderness nor edema. Neurologic:  Normal speech and language. No gross focal neurologic deficits are appreciated. Skin:  Skin is warm, dry and intact. No rash noted. Psychiatric: Mood and affect are normal. Speech and behavior are normal.  ____________________________________________   LABS (all labs ordered are listed, but only abnormal results are displayed)  Labs Reviewed  BASIC METABOLIC PANEL - Abnormal; Notable for the following components:      Result Value   Glucose, Bld 113 (*)    All other components within normal limits  URINALYSIS, COMPLETE (UACMP) WITH MICROSCOPIC - Abnormal; Notable for the following components:   Color, Urine AMBER (*)    APPearance CLOUDY (*)    Ketones, ur 5 (*)    Bacteria, UA MANY (*)    All other components within normal limits  URINE CULTURE  CBC  TROPONIN I (HIGH SENSITIVITY)  TROPONIN I (HIGH SENSITIVITY)   ____________________________________________  EKG  ED ECG REPORT I, 06/04/20, the attending physician, personally viewed and interpreted this ECG.   Date: 06/02/2020  EKG Time: 14:09  Rate: 72  Rhythm: normal sinus  rhythm  Axis: Normal  Intervals:none  ST&T Change: None   PROCEDURES  Procedure(s) performed (including Critical Care):  Procedures   ____________________________________________   INITIAL IMPRESSION / ASSESSMENT AND PLAN / ED COURSE       60 year old female with past medical history of hypertension, asthma, kidney stones, diabetes, and chronic pain syndrome who presents to the ED with ongoing flank and right lower quadrant abdominal pain after being diagnosed with nonobstructing right kidney stone last week.  She denies any symptoms consistent with urinary tract infection but UA is pending at this time.  EKG shows no evidence of arrhythmia or ischemia and initial troponin is negative.  No findings consistent with hypertensive emergency at this time and I suspect her elevated blood pressure is due to ongoing pain.  We will treat pain with IV  Toradol as she is allergic to both morphine and Dilaudid.  We will also recheck CT scan to better assess status of right ureterolithiasis.  CT scan reviewed by me and shows ureterolithiasis at right UVJ with no evidence of hydronephrosis.  UA does not appear consistent for infection but we will send for culture.  Patient report significant provement in symptoms following Toradol and there remains no evidence of hypertensive emergency.  Patient is appropriate for discharge home with urology follow-up, was prescribed Percocet as she has tolerated this in the past.  She was also prescribed Zofran and counseled to return to the ED for any new or worsening symptoms.  Patient agrees with plan.      ____________________________________________   FINAL CLINICAL IMPRESSION(S) / ED DIAGNOSES  Final diagnoses:  Ureterolithiasis     ED Discharge Orders         Ordered    oxyCODONE-acetaminophen (PERCOCET) 5-325 MG tablet  Every 4 hours PRN        06/02/20 1834    ondansetron (ZOFRAN ODT) 4 MG disintegrating tablet  Every 8 hours PRN        06/02/20  1834           Note:  This document was prepared using Dragon voice recognition software and may include unintentional dictation errors.   Chesley NoonJessup, Manahil Vanzile, MD 06/02/20 [phone]

## 2020-06-02 NOTE — ED Notes (Signed)
Pt taken to CT.

## 2020-06-04 LAB — URINE CULTURE: Culture: NO GROWTH

## 2020-07-15 HISTORY — PX: CYSTOSCOPY/URETEROSCOPY/HOLMIUM LASER/STENT PLACEMENT: SHX6546

## 2020-09-19 ENCOUNTER — Other Ambulatory Visit: Payer: Self-pay | Admitting: Neurosurgery

## 2020-09-19 DIAGNOSIS — M5441 Lumbago with sciatica, right side: Secondary | ICD-10-CM

## 2020-09-19 DIAGNOSIS — G8929 Other chronic pain: Secondary | ICD-10-CM

## 2020-10-01 ENCOUNTER — Other Ambulatory Visit: Payer: Self-pay

## 2020-10-01 ENCOUNTER — Ambulatory Visit
Admission: RE | Admit: 2020-10-01 | Discharge: 2020-10-01 | Disposition: A | Discharge status: Home / Self Care | Payer: BC Managed Care – PPO | Source: Ambulatory Visit | Attending: Neurosurgery | Admitting: Neurosurgery

## 2020-10-01 DIAGNOSIS — M5442 Lumbago with sciatica, left side: Secondary | ICD-10-CM | POA: Diagnosis present

## 2020-10-01 DIAGNOSIS — G8929 Other chronic pain: Secondary | ICD-10-CM | POA: Diagnosis present

## 2020-10-01 DIAGNOSIS — M5441 Lumbago with sciatica, right side: Secondary | ICD-10-CM | POA: Diagnosis not present

## 2020-12-09 ENCOUNTER — Other Ambulatory Visit: Payer: Self-pay

## 2020-12-09 ENCOUNTER — Emergency Department: Payer: BC Managed Care – PPO

## 2020-12-09 ENCOUNTER — Encounter: Payer: Self-pay | Admitting: Emergency Medicine

## 2020-12-09 ENCOUNTER — Emergency Department
Admission: EM | Admit: 2020-12-09 | Discharge: 2020-12-09 | Disposition: A | Discharge status: Home / Self Care | Payer: BC Managed Care – PPO | Attending: Emergency Medicine | Admitting: Emergency Medicine

## 2020-12-09 DIAGNOSIS — I10 Essential (primary) hypertension: Secondary | ICD-10-CM | POA: Diagnosis not present

## 2020-12-09 DIAGNOSIS — R519 Headache, unspecified: Secondary | ICD-10-CM | POA: Diagnosis not present

## 2020-12-09 DIAGNOSIS — R112 Nausea with vomiting, unspecified: Secondary | ICD-10-CM | POA: Insufficient documentation

## 2020-12-09 DIAGNOSIS — R1012 Left upper quadrant pain: Secondary | ICD-10-CM | POA: Insufficient documentation

## 2020-12-09 DIAGNOSIS — Z5321 Procedure and treatment not carried out due to patient leaving prior to being seen by health care provider: Secondary | ICD-10-CM | POA: Insufficient documentation

## 2020-12-09 DIAGNOSIS — R42 Dizziness and giddiness: Secondary | ICD-10-CM | POA: Diagnosis not present

## 2020-12-09 DIAGNOSIS — R197 Diarrhea, unspecified: Secondary | ICD-10-CM | POA: Diagnosis not present

## 2020-12-09 LAB — COMPREHENSIVE METABOLIC PANEL
ALT: 16 U/L (ref 0–44)
AST: 21 U/L (ref 15–41)
Albumin: 4.1 g/dL (ref 3.5–5.0)
Alkaline Phosphatase: 82 U/L (ref 38–126)
Anion gap: 9 (ref 5–15)
BUN: 8 mg/dL (ref 6–20)
CO2: 28 mmol/L (ref 22–32)
Calcium: 9.6 mg/dL (ref 8.9–10.3)
Chloride: 103 mmol/L (ref 98–111)
Creatinine, Ser: 0.67 mg/dL (ref 0.44–1.00)
GFR, Estimated: >60 mL/min (ref >60)
Glucose, Bld: 102 mg/dL — ABNORMAL HIGH (ref 70–99)
Potassium: 3 mmol/L — ABNORMAL LOW (ref 3.5–5.1)
Sodium: 140 mmol/L (ref 135–145)
Total Bilirubin: 0.4 mg/dL (ref 0.3–1.2)
Total Protein: 7.2 g/dL (ref 6.5–8.1)

## 2020-12-09 LAB — CBC
HCT: 41.3 % (ref 36.0–46.0)
Hemoglobin: 13.8 g/dL (ref 12.0–15.0)
MCH: 30.9 pg (ref 26.0–34.0)
MCHC: 33.4 g/dL (ref 30.0–36.0)
MCV: 92.4 fL (ref 80.0–100.0)
Platelets: 307 10*3/uL (ref 150–400)
RBC: 4.47 MIL/uL (ref 3.87–5.11)
RDW: 14.2 % (ref 11.5–15.5)
WBC: 6.5 10*3/uL (ref 4.0–10.5)
nRBC: 0 % (ref 0.0–0.2)

## 2020-12-09 LAB — TYPE AND SCREEN
ABO/RH(D): A POS
Antibody Screen: NEGATIVE

## 2020-12-09 LAB — LIPASE, BLOOD: Lipase: 24 U/L (ref 11–51)

## 2020-12-09 NOTE — ED Triage Notes (Addendum)
Pt via POV from home. Pt c/o LUQ abd pain since Friday, bloody stool since Saturday pt states it is bright red blood, and NVD since Tuesday. Pt also c/o headache and dizziness since Friday. Pt is HTN during triage and states she has been taking her BP medications. Pt is A&Ox4 and NAD. Pt has a hx of bariatric surgery in last October. Pt also has a hx of hysterectomy and appendectomy. Pt states she also has been indirectly exposed to COVID.

## 2021-01-06 ENCOUNTER — Other Ambulatory Visit: Payer: Self-pay | Admitting: Neurosurgery

## 2021-01-15 ENCOUNTER — Encounter
Admission: RE | Admit: 2021-01-15 | Discharge: 2021-01-15 | Disposition: A | Discharge status: Home / Self Care | Payer: BC Managed Care – PPO | Source: Ambulatory Visit | Attending: Neurosurgery | Admitting: Neurosurgery

## 2021-01-15 ENCOUNTER — Other Ambulatory Visit: Payer: Self-pay

## 2021-01-15 DIAGNOSIS — Z01812 Encounter for preprocedural laboratory examination: Secondary | ICD-10-CM | POA: Diagnosis present

## 2021-01-15 HISTORY — DX: Nausea with vomiting, unspecified: R11.2

## 2021-01-15 HISTORY — DX: Anemia, unspecified: D64.9

## 2021-01-15 HISTORY — DX: Family history of other specified conditions: Z84.89

## 2021-01-15 HISTORY — DX: Polycystic ovarian syndrome: E28.2

## 2021-01-15 HISTORY — DX: Unspecified osteoarthritis, unspecified site: M19.90

## 2021-01-15 HISTORY — DX: Personal history of urinary calculi: Z87.442

## 2021-01-15 HISTORY — DX: Pneumonia, unspecified organism: J18.9

## 2021-01-15 HISTORY — DX: Hypothyroidism, unspecified: E03.9

## 2021-01-15 HISTORY — DX: Other specified postprocedural states: Z98.890

## 2021-01-15 HISTORY — DX: Gastro-esophageal reflux disease without esophagitis: K21.9

## 2021-01-15 HISTORY — DX: Cardiac murmur, unspecified: R01.1

## 2021-01-15 LAB — TYPE AND SCREEN
ABO/RH(D): A POS
Antibody Screen: NEGATIVE

## 2021-01-15 LAB — URINALYSIS, ROUTINE W REFLEX MICROSCOPIC
Bilirubin Urine: NEGATIVE
Glucose, UA: NEGATIVE mg/dL
Hgb urine dipstick: NEGATIVE
Ketones, ur: NEGATIVE mg/dL
Leukocytes,Ua: NEGATIVE
Nitrite: NEGATIVE
Protein, ur: NEGATIVE mg/dL
Specific Gravity, Urine: 1.02 (ref 1.005–1.030)
pH: 5 (ref 5.0–8.0)

## 2021-01-15 LAB — CBC
HCT: 39.8 % (ref 36.0–46.0)
Hemoglobin: 13.4 g/dL (ref 12.0–15.0)
MCH: 31.9 pg (ref 26.0–34.0)
MCHC: 33.7 g/dL (ref 30.0–36.0)
MCV: 94.8 fL (ref 80.0–100.0)
Platelets: 264 10*3/uL (ref 150–400)
RBC: 4.2 MIL/uL (ref 3.87–5.11)
RDW: 13.9 % (ref 11.5–15.5)
WBC: 4.5 10*3/uL (ref 4.0–10.5)
nRBC: 0 % (ref 0.0–0.2)

## 2021-01-15 LAB — BASIC METABOLIC PANEL
Anion gap: 9 (ref 5–15)
BUN: 14 mg/dL (ref 6–20)
CO2: 28 mmol/L (ref 22–32)
Calcium: 9.6 mg/dL (ref 8.9–10.3)
Chloride: 106 mmol/L (ref 98–111)
Creatinine, Ser: 0.79 mg/dL (ref 0.44–1.00)
GFR, Estimated: >60 mL/min (ref >60)
Glucose, Bld: 98 mg/dL (ref 70–99)
Potassium: 4 mmol/L (ref 3.5–5.1)
Sodium: 143 mmol/L (ref 135–145)

## 2021-01-15 LAB — SURGICAL PCR SCREEN
MRSA, PCR: NEGATIVE
Staphylococcus aureus: NEGATIVE

## 2021-01-15 NOTE — Patient Instructions (Addendum)
Your procedure is scheduled on: Wednesday, October 5 Report to the Registration Desk on the 1st floor of the [facility] To find out your arrival time, please call [phone] between 1PM - 3PM on: Tuesday, October 4  REMEMBER: Instructions that are not followed completely may result in serious medical risk, up to and including death; or upon the discretion of your surgeon and anesthesiologist your surgery may need to be rescheduled.  Do not eat food after midnight the night before surgery.  No gum chewing, lozengers or hard candies.  You may however, drink CLEAR liquids up to 2 hours before you are scheduled to arrive for your surgery. Do not drink anything within 2 hours of your scheduled arrival time.  Clear liquids include: - water  - apple juice without pulp - gatorade (not RED, PURPLE, OR BLUE) - black coffee or tea (Do NOT add milk or creamers to the coffee or tea) Do NOT drink anything that is not on this list.  TAKE THESE MEDICATIONS THE MORNING OF SURGERY WITH A SIP OF WATER:  Advair inhaler Levothyroxine Pantoprazole  One week prior to surgery: starting September 28 Stop Anti-inflammatories (NSAIDS) such as Advil, Aleve, Ibuprofen, Motrin, Naproxen, Naprosyn and Aspirin based products such as Excedrin, Goodys Powder, BC Powder. Stop ANY OVER THE COUNTER supplements until after surgery. Stop multiple vitamin and vitamin D. You may however, continue to take Tylenol if needed for pain up until the day of surgery.  No Alcohol for 24 hours before or after surgery.  No Smoking including e-cigarettes for 24 hours prior to surgery.  No chewable tobacco products for at least 6 hours prior to surgery.  No nicotine patches on the day of surgery.  Do not use any "recreational" drugs for at least a week prior to your surgery.  Please be advised that the combination of cocaine and anesthesia may have negative outcomes, up to and including death. If you test positive for  cocaine, your surgery will be cancelled.  On the morning of surgery brush your teeth with toothpaste and water, you may rinse your mouth with mouthwash if you wish. Do not swallow any toothpaste or mouthwash.  Use CHG Soap as directed on instruction sheet.  Do not wear jewelry, make-up, hairpins, clips or nail polish.  Do not wear lotions, powders, or perfumes.   Do not shave body from the neck down 48 hours prior to surgery just in case you cut yourself which could leave a site for infection.  Also, freshly shaved skin may become irritated if using the CHG soap.  Contact lenses, hearing aids and dentures may not be worn into surgery.  Do not bring valuables to the hospital. [facility] is not responsible for any missing/lost belongings or valuables.   Notify your doctor if there is any change in your medical condition (cold, fever, infection).  Wear comfortable clothing (specific to your surgery type) to the hospital.  After surgery, you can help prevent lung complications by doing breathing exercises.  Take deep breaths and cough every 1-2 hours. Your doctor may order a device called an Incentive Spirometer to help you take deep breaths.  If you are being discharged the day of surgery, you will not be allowed to drive home. You will need a responsible adult (18 years or older) to drive you home and stay with you that night.   If you are taking public transportation, you will need to have a responsible adult (18 years or older) with you.  Please confirm with your physician that it is acceptable to use public transportation.   Please call the Pre-admissions Testing Dept. at [phone] if you have any questions about these instructions.  Surgery Visitation Policy:  Patients undergoing a surgery or procedure may have one family member or support person with them as long as that person is not COVID-19 positive or experiencing its symptoms.  That person may remain in the waiting  area during the procedure and may rotate out with other people.

## 2021-01-22 ENCOUNTER — Encounter: Payer: Self-pay | Admitting: Neurosurgery

## 2021-01-22 ENCOUNTER — Other Ambulatory Visit: Payer: Self-pay

## 2021-01-22 ENCOUNTER — Ambulatory Visit
Admission: RE | Admit: 2021-01-22 | Discharge: 2021-01-22 | Disposition: A | Discharge status: Home / Self Care | Payer: BC Managed Care – PPO | Source: Ambulatory Visit | Attending: Neurosurgery | Admitting: Neurosurgery

## 2021-01-22 ENCOUNTER — Ambulatory Visit: Payer: BC Managed Care – PPO | Admitting: Urgent Care

## 2021-01-22 ENCOUNTER — Encounter
Admission: RE | Disposition: A | Discharge status: Home / Self Care | Payer: Self-pay | Source: Ambulatory Visit | Attending: Neurosurgery

## 2021-01-22 ENCOUNTER — Ambulatory Visit: Payer: BC Managed Care – PPO

## 2021-01-22 DIAGNOSIS — M5416 Radiculopathy, lumbar region: Secondary | ICD-10-CM | POA: Diagnosis not present

## 2021-01-22 DIAGNOSIS — M48062 Spinal stenosis, lumbar region with neurogenic claudication: Secondary | ICD-10-CM | POA: Diagnosis present

## 2021-01-22 DIAGNOSIS — Z886 Allergy status to analgesic agent status: Secondary | ICD-10-CM | POA: Insufficient documentation

## 2021-01-22 DIAGNOSIS — Z888 Allergy status to other drugs, medicaments and biological substances status: Secondary | ICD-10-CM | POA: Insufficient documentation

## 2021-01-22 DIAGNOSIS — Z882 Allergy status to sulfonamides status: Secondary | ICD-10-CM | POA: Diagnosis not present

## 2021-01-22 DIAGNOSIS — Z885 Allergy status to narcotic agent status: Secondary | ICD-10-CM | POA: Insufficient documentation

## 2021-01-22 DIAGNOSIS — Z419 Encounter for procedure for purposes other than remedying health state, unspecified: Secondary | ICD-10-CM

## 2021-01-22 HISTORY — PX: LUMBAR LAMINECTOMY/DECOMPRESSION MICRODISCECTOMY: SHX5026

## 2021-01-22 SURGERY — LUMBAR LAMINECTOMY/DECOMPRESSION MICRODISCECTOMY 1 LEVEL
Anesthesia: General | Laterality: Right

## 2021-01-22 MED ORDER — CHLORHEXIDINE GLUCONATE 0.12 % MT SOLN: 15.0000 mL | Freq: Once | OROMUCOSAL | Status: AC

## 2021-01-22 MED ORDER — FENTANYL CITRATE (PF) 100 MCG/2ML IJ SOLN
INTRAMUSCULAR | Status: AC
  Filled 2021-01-22: qty 2

## 2021-01-22 MED ORDER — ORAL CARE MOUTH RINSE: 15.0000 mL | Freq: Once | OROMUCOSAL | Status: AC

## 2021-01-22 MED ORDER — LIDOCAINE HCL (CARDIAC) PF 100 MG/5ML IV SOSY
PREFILLED_SYRINGE | INTRAVENOUS | Status: DC | PRN
  Administered 2021-01-22: 80 mg via INTRAVENOUS

## 2021-01-22 MED ORDER — METHOCARBAMOL 500 MG PO TABS: 500.0000 mg | ORAL_TABLET | Freq: Four times a day (QID) | ORAL | 0 refills | Status: DC

## 2021-01-22 MED ORDER — HEMOSTATIC AGENTS (NO CHARGE) OPTIME
TOPICAL | Status: DC | PRN
  Administered 2021-01-22: 1 via TOPICAL

## 2021-01-22 MED ORDER — FENTANYL CITRATE (PF) 100 MCG/2ML IJ SOLN
INTRAMUSCULAR | Status: AC
  Administered 2021-01-22: 25 ug via INTRAVENOUS
  Filled 2021-01-22: qty 2

## 2021-01-22 MED ORDER — ONDANSETRON HCL 4 MG/2ML IJ SOLN
INTRAMUSCULAR | Status: DC | PRN
  Administered 2021-01-22: 4 mg via INTRAVENOUS

## 2021-01-22 MED ORDER — PROPOFOL 10 MG/ML IV BOLUS
INTRAVENOUS | Status: DC | PRN
  Administered 2021-01-22: 150 mg via INTRAVENOUS

## 2021-01-22 MED ORDER — FENTANYL CITRATE (PF) 100 MCG/2ML IJ SOLN
INTRAMUSCULAR | Status: AC
  Administered 2021-01-22: 50 ug via INTRAVENOUS
  Filled 2021-01-22: qty 2

## 2021-01-22 MED ORDER — OXYCODONE HCL 5 MG PO CAPS: 5.0000 mg | ORAL_CAPSULE | Freq: Four times a day (QID) | ORAL | 0 refills | Status: AC | PRN

## 2021-01-22 MED ORDER — PROPOFOL 500 MG/50ML IV EMUL
INTRAVENOUS | Status: DC | PRN
  Administered 2021-01-22: 170 ug/kg/min via INTRAVENOUS

## 2021-01-22 MED ORDER — FENTANYL CITRATE (PF) 100 MCG/2ML IJ SOLN
25.0000 ug | INTRAMUSCULAR | Status: DC | PRN
  Administered 2021-01-22: 25 ug via INTRAVENOUS
  Administered 2021-01-22: 50 ug via INTRAVENOUS

## 2021-01-22 MED ORDER — APREPITANT 40 MG PO CAPS: 40.0000 mg | ORAL_CAPSULE | Freq: Once | ORAL | Status: AC

## 2021-01-22 MED ORDER — FENTANYL CITRATE (PF) 100 MCG/2ML IJ SOLN
INTRAMUSCULAR | Status: DC | PRN
  Administered 2021-01-22 (×2): 50 ug via INTRAVENOUS

## 2021-01-22 MED ORDER — CHLORHEXIDINE GLUCONATE 0.12 % MT SOLN
OROMUCOSAL | Status: AC
  Administered 2021-01-22: 15 mL via OROMUCOSAL
  Filled 2021-01-22: qty 15

## 2021-01-22 MED ORDER — BUPIVACAINE-EPINEPHRINE (PF) 0.5% -1:200000 IJ SOLN
INTRAMUSCULAR | Status: DC | PRN
  Administered 2021-01-22: 9 mL

## 2021-01-22 MED ORDER — PROPOFOL 1000 MG/100ML IV EMUL
INTRAVENOUS | Status: AC
  Filled 2021-01-22: qty 100

## 2021-01-22 MED ORDER — SODIUM CHLORIDE 0.9 % IV SOLN
INTRAVENOUS | Status: DC | PRN
  Administered 2021-01-22: 40 mL

## 2021-01-22 MED ORDER — OXYCODONE HCL 5 MG PO TABS
ORAL_TABLET | ORAL | Status: AC
  Administered 2021-01-22: 5 mg via ORAL
  Filled 2021-01-22: qty 1

## 2021-01-22 MED ORDER — SENNA 8.6 MG PO TABS: 1.0000 | ORAL_TABLET | Freq: Every day | ORAL | 0 refills | Status: DC | PRN

## 2021-01-22 MED ORDER — LIDOCAINE HCL (PF) 2 % IJ SOLN
INTRAMUSCULAR | Status: AC
  Filled 2021-01-22: qty 5

## 2021-01-22 MED ORDER — ONDANSETRON HCL 4 MG/2ML IJ SOLN
INTRAMUSCULAR | Status: AC
  Filled 2021-01-22: qty 2

## 2021-01-22 MED ORDER — CEFAZOLIN SODIUM-DEXTROSE 2-4 GM/100ML-% IV SOLN
INTRAVENOUS | Status: AC
  Filled 2021-01-22: qty 100

## 2021-01-22 MED ORDER — SODIUM CHLORIDE FLUSH 0.9 % IV SOLN
INTRAVENOUS | Status: AC
  Filled 2021-01-22: qty 10

## 2021-01-22 MED ORDER — LACTATED RINGERS IV SOLN: INTRAVENOUS | Status: DC

## 2021-01-22 MED ORDER — ONDANSETRON HCL 4 MG/2ML IJ SOLN: 4.0000 mg | Freq: Once | INTRAMUSCULAR | Status: AC | PRN

## 2021-01-22 MED ORDER — CEFAZOLIN SODIUM-DEXTROSE 2-4 GM/100ML-% IV SOLN
2.0000 g | INTRAVENOUS | Status: AC
  Administered 2021-01-22: 2 g via INTRAVENOUS

## 2021-01-22 MED ORDER — ACETAMINOPHEN 10 MG/ML IV SOLN
INTRAVENOUS | Status: DC | PRN
  Administered 2021-01-22: 1000 mg via INTRAVENOUS

## 2021-01-22 MED ORDER — ACETAMINOPHEN 10 MG/ML IV SOLN
INTRAVENOUS | Status: AC
  Filled 2021-01-22: qty 100

## 2021-01-22 MED ORDER — METHYLPREDNISOLONE ACETATE 40 MG/ML IJ SUSP
INTRAMUSCULAR | Status: DC | PRN
  Administered 2021-01-22: 40 mg

## 2021-01-22 MED ORDER — SUCCINYLCHOLINE CHLORIDE 200 MG/10ML IV SOSY
PREFILLED_SYRINGE | INTRAVENOUS | Status: AC
  Filled 2021-01-22: qty 10

## 2021-01-22 MED ORDER — DEXAMETHASONE SODIUM PHOSPHATE 10 MG/ML IJ SOLN
INTRAMUSCULAR | Status: AC
  Filled 2021-01-22: qty 1

## 2021-01-22 MED ORDER — MIDAZOLAM HCL 2 MG/2ML IJ SOLN
INTRAMUSCULAR | Status: AC
  Filled 2021-01-22: qty 2

## 2021-01-22 MED ORDER — MIDAZOLAM HCL 2 MG/2ML IJ SOLN
INTRAMUSCULAR | Status: DC | PRN
  Administered 2021-01-22: 2 mg via INTRAVENOUS

## 2021-01-22 MED ORDER — DEXAMETHASONE SODIUM PHOSPHATE 10 MG/ML IJ SOLN
INTRAMUSCULAR | Status: DC | PRN
  Administered 2021-01-22: 10 mg via INTRAVENOUS

## 2021-01-22 MED ORDER — METHOCARBAMOL 500 MG PO TABS
ORAL_TABLET | ORAL | Status: AC
  Administered 2021-01-22: 500 mg
  Filled 2021-01-22: qty 1

## 2021-01-22 MED ORDER — OXYCODONE HCL 5 MG PO TABS
5.0000 mg | ORAL_TABLET | Freq: Four times a day (QID) | ORAL | Status: DC | PRN
Start: 2021-01-22 — End: 2021-01-22

## 2021-01-22 MED ORDER — BUPIVACAINE HCL 0.5 % IJ SOLN
INTRAMUSCULAR | Status: DC | PRN
  Administered 2021-01-22: 20 mL

## 2021-01-22 MED ORDER — PROPOFOL 10 MG/ML IV BOLUS
INTRAVENOUS | Status: AC
  Filled 2021-01-22: qty 40

## 2021-01-22 MED ORDER — 0.9 % SODIUM CHLORIDE (POUR BTL) OPTIME
TOPICAL | Status: DC | PRN
  Administered 2021-01-22: 1000 mL

## 2021-01-22 MED ORDER — APREPITANT 40 MG PO CAPS
ORAL_CAPSULE | ORAL | Status: AC
  Administered 2021-01-22: 40 mg via ORAL
  Filled 2021-01-22: qty 1

## 2021-01-22 MED ORDER — ONDANSETRON HCL 4 MG/2ML IJ SOLN
INTRAMUSCULAR | Status: AC
  Administered 2021-01-22: 4 mg via INTRAVENOUS
  Filled 2021-01-22: qty 2

## 2021-01-22 MED ORDER — SUCCINYLCHOLINE CHLORIDE 200 MG/10ML IV SOSY
PREFILLED_SYRINGE | INTRAVENOUS | Status: DC | PRN
  Administered 2021-01-22: 100 mg via INTRAVENOUS

## 2021-01-22 SURGICAL SUPPLY — 56 items
ADH SKN CLS APL DERMABOND .7 (GAUZE/BANDAGES/DRESSINGS) ×1
AGENT HMST KT MTR STRL THRMB (HEMOSTASIS) ×1
APL PRP STRL LF DISP 70% ISPRP (MISCELLANEOUS) ×1
BUR NEURO DRILL SOFT 3.0X3.8M (BURR) ×2 IMPLANT
CHLORAPREP W/TINT 26 (MISCELLANEOUS) ×3 IMPLANT
CNTNR SPEC 2.5X3XGRAD LEK (MISCELLANEOUS) ×1
CONT SPEC 4OZ STER OR WHT (MISCELLANEOUS) ×1
CONT SPEC 4OZ STRL OR WHT (MISCELLANEOUS) ×1
CONTAINER SPEC 2.5X3XGRAD LEK (MISCELLANEOUS) ×1 IMPLANT
COUNTER NEEDLE 20/40 LG (NEEDLE) ×2 IMPLANT
CUP MEDICINE 2OZ PLAST GRAD ST (MISCELLANEOUS) ×4 IMPLANT
DERMABOND ADVANCED (GAUZE/BANDAGES/DRESSINGS) ×1
DERMABOND ADVANCED .7 DNX12 (GAUZE/BANDAGES/DRESSINGS) ×1 IMPLANT
DRAPE C ARM PK CFD 31 SPINE (DRAPES) ×2 IMPLANT
DRAPE LAPAROTOMY 100X77 ABD (DRAPES) ×2 IMPLANT
DRAPE MICROSCOPE SPINE 48X150 (DRAPES) ×2 IMPLANT
DRAPE SURG 17X11 SM STRL (DRAPES) ×8 IMPLANT
ELECT CAUTERY BLADE TIP 2.5 (TIP) ×2
ELECT EZSTD 165MM 6.5IN (MISCELLANEOUS)
ELECT REM PT RETURN 9FT ADLT (ELECTROSURGICAL) ×2
ELECTRODE CAUTERY BLDE TIP 2.5 (TIP) ×1 IMPLANT
ELECTRODE EZSTD 165MM 6.5IN (MISCELLANEOUS) IMPLANT
ELECTRODE REM PT RTRN 9FT ADLT (ELECTROSURGICAL) ×1 IMPLANT
GAUZE 4X4 16PLY ~~LOC~~+RFID DBL (SPONGE) ×2 IMPLANT
GLOVE SURG SYN 6.5 ES PF (GLOVE) ×4 IMPLANT
GLOVE SURG SYN 6.5 PF PI (GLOVE) ×2 IMPLANT
GLOVE SURG SYN 8.5  E (GLOVE) ×3
GLOVE SURG SYN 8.5 E (GLOVE) ×3 IMPLANT
GLOVE SURG SYN 8.5 PF PI (GLOVE) ×3 IMPLANT
GLOVE SURG UNDER POLY LF SZ6.5 (GLOVE) ×2 IMPLANT
GOWN SRG LRG LVL 4 IMPRV REINF (GOWNS) ×1 IMPLANT
GOWN SRG XL LVL 3 NONREINFORCE (GOWNS) ×1 IMPLANT
GOWN STRL NON-REIN TWL XL LVL3 (GOWNS) ×2
GOWN STRL REIN LRG LVL4 (GOWNS) ×2
GRADUATE 1200CC STRL 31836 (MISCELLANEOUS) ×2 IMPLANT
KIT SPINAL PRONEVIEW (KITS) ×2 IMPLANT
MANIFOLD NEPTUNE II (INSTRUMENTS) ×2 IMPLANT
MARKER SKIN DUAL TIP RULER LAB (MISCELLANEOUS) ×4 IMPLANT
NDL SAFETY ECLIPSE 18X1.5 (NEEDLE) ×1 IMPLANT
NEEDLE HYPO 18GX1.5 SHARP (NEEDLE) ×2
NEEDLE HYPO 22GX1.5 SAFETY (NEEDLE) ×2 IMPLANT
NS IRRIG 1000ML POUR BTL (IV SOLUTION) ×2 IMPLANT
PACK LAMINECTOMY NEURO (CUSTOM PROCEDURE TRAY) ×2 IMPLANT
PAD ARMBOARD 7.5X6 YLW CONV (MISCELLANEOUS) ×2 IMPLANT
SURGIFLO W/THROMBIN 8M KIT (HEMOSTASIS) ×2 IMPLANT
SUT DVC VLOC 3-0 CL 6 P-12 (SUTURE) ×2 IMPLANT
SUT VIC AB 0 CT1 27 (SUTURE) ×2
SUT VIC AB 0 CT1 27XCR 8 STRN (SUTURE) ×1 IMPLANT
SUT VIC AB 2-0 CT1 18 (SUTURE) ×2 IMPLANT
SYR 10ML LL (SYRINGE) ×2 IMPLANT
SYR 20ML LL LF (SYRINGE) ×2 IMPLANT
SYR 30ML LL (SYRINGE) ×4 IMPLANT
SYR 3ML LL SCALE MARK (SYRINGE) ×2 IMPLANT
TOWEL OR 17X26 4PK STRL BLUE (TOWEL DISPOSABLE) ×6 IMPLANT
TUBING CONNECTING 10 (TUBING) ×2 IMPLANT
WATER STERILE IRR 500ML POUR (IV SOLUTION) ×2 IMPLANT

## 2021-01-22 NOTE — Transfer of Care (Signed)
Immediate Anesthesia Transfer of Care Note  Patient: Megan [name]  Procedure(s) Performed: L4-5 DECOMPRESSION (Right)  Patient Location: PACU  Anesthesia Type:General  Level of Consciousness: awake and alert   Airway & Oxygen Therapy: Patient Spontanous Breathing and Patient connected to nasal cannula oxygen  Post-op Assessment: Report given to RN and Post -op Vital signs reviewed and stable  Post vital signs: Reviewed and stable  Last Vitals:  Vitals Value Taken Time  BP 163/86 01/22/21 1211  Temp    Pulse 72 01/22/21 1214  Resp    SpO2 100 % 01/22/21 1214  Vitals shown include unvalidated device data.  Last Pain:  Vitals:   01/22/21 0951  PainSc: 0-No pain         Complications: No notable events documented.

## 2021-01-22 NOTE — Anesthesia Preprocedure Evaluation (Signed)
Anesthesia Evaluation  Patient identified by MRN, date of birth, ID band Patient awake    Reviewed: Allergy & Precautions, NPO status , Patient's Chart, lab work & pertinent test results  History of Anesthesia Complications (+) PONV and history of anesthetic complications  Airway Mallampati: II  TM Distance: >3 FB Neck ROM: Full    Dental no notable dental hx. (+) Teeth Intact   Pulmonary asthma , neg sleep apnea, neg COPD, Patient abstained from smoking.Not current smoker,    Pulmonary exam normal breath sounds clear to auscultation       Cardiovascular Exercise Tolerance: Good METShypertension, (-) CAD and (-) Past MI (-) dysrhythmias  Rhythm:Regular Rate:Normal - Systolic murmurs    Neuro/Psych  Neuromuscular disease negative psych ROS   GI/Hepatic GERD  Controlled and Medicated,(+)     (-) substance abuse  ,   Endo/Other  neg diabetesHypothyroidism   Renal/GU Renal disease     Musculoskeletal   Abdominal   Peds  Hematology   Anesthesia Other Findings Past Medical History: No date: Anemia No date: Arthritis No date: Asthma 2008: DVT (deep venous thrombosis) (HCC)     Comment:  left femeral s/p femeral osteotomy No date: Family history of adverse reaction to anesthesia     Comment:  mother had reaction but doesn't know what it is No date: GERD (gastroesophageal reflux disease) No date: Heart murmur No date: History of kidney stones No date: Hypertension No date: Hypothyroidism No date: Pneumonia No date: Polycystic ovary No date: PONV (postoperative nausea and vomiting) No date: Renal disorder No date: Thyroid disease  Reproductive/Obstetrics                             Anesthesia Physical Anesthesia Plan  ASA: 2  Anesthesia Plan: General   Post-op Pain Management:    Induction: Intravenous  PONV Risk Score and Plan: 4 or greater and Ondansetron, Dexamethasone,  Midazolam, TIVA, Propofol infusion and Aprepitant  Airway Management Planned: Oral ETT  Additional Equipment: None  Intra-op Plan:   Post-operative Plan: Extubation in OR  Informed Consent: I have reviewed the patients History and Physical, chart, labs and discussed the procedure including the risks, benefits and alternatives for the proposed anesthesia with the patient or authorized representative who has indicated his/her understanding and acceptance.     Dental advisory given  Plan Discussed with: CRNA and Surgeon  Anesthesia Plan Comments: (Discussed risks of anesthesia with patient, including PONV, sore throat, lip/dental damage. Rare risks discussed as well, such as cardiorespiratory and neurological sequelae, and allergic reactions. Patient understands.)        Anesthesia Quick Evaluation

## 2021-01-22 NOTE — OR Nursing (Signed)
Patient unable to void on bedpan and bedside commode.  Bladder scan done for 680 ml. Noted.  In & out cath done at 1500 for 350 ml.  Patient states,"feels better."

## 2021-01-22 NOTE — Progress Notes (Signed)
Patient reports nausea, gave ordered antiemetic

## 2021-01-22 NOTE — Anesthesia Procedure Notes (Signed)
Procedure Name: Intubation Date/Time: 01/22/2021 10:46 AM Performed by: Fredderick Phenix, CRNA Pre-anesthesia Checklist: Patient identified, Emergency Drugs available, Suction available and Patient being monitored Patient Re-evaluated:Patient Re-evaluated prior to induction Oxygen Delivery Method: Circle system utilized Preoxygenation: Pre-oxygenation with 100% oxygen Induction Type: IV induction Ventilation: Mask ventilation without difficulty Laryngoscope Size: Mac and 4 Grade View: Grade I Tube type: Oral Tube size: 7.0 mm Number of attempts: 1 Airway Equipment and Method: Stylet and Oral airway Placement Confirmation: ETT inserted through vocal cords under direct vision, positive ETCO2 and breath sounds checked- equal and bilateral Secured at: 21 cm Tube secured with: Tape Dental Injury: Teeth and Oropharynx as per pre-operative assessment

## 2021-01-22 NOTE — Op Note (Signed)
Indications: Megan [name] is a 60 yo female who presented with lumbar radiculopathy.  She failed conservative management prompting surgical intervention  Findings: stenosis at L4-5  Preoperative Diagnosis: Lumbar Stenosis with neurogenic claudication Postoperative Diagnosis: same   EBL: 10 ml IVF: 700 ml Drains: none Disposition: Extubated and Stable to PACU Complications: none  No foley catheter was placed.   Preoperative Note:   Risks of surgery discussed include: infection, bleeding, stroke, coma, death, paralysis, CSF leak, nerve/spinal cord injury, numbness, tingling, weakness, complex regional pain syndrome, recurrent stenosis and/or disc herniation, vascular injury, development of instability, neck/back pain, need for further surgery, persistent symptoms, development of deformity, and the risks of anesthesia. The patient understood these risks and agreed to proceed.  Operative Note:   1. L4-5 lumbar decompression including central laminectomy and bilateral medial facetectomies including foraminotomies  The patient was then brought from the preoperative center with intravenous access established.  The patient underwent general anesthesia and endotracheal tube intubation, and was then rotated on the [place] rail top where all pressure points were appropriately padded.  The skin was then thoroughly cleansed.  Perioperative antibiotic prophylaxis was administered.  Sterile prep and drapes were then applied and a timeout was then observed.  C-arm was brought into the field under sterile conditions and under lateral visualization the L4-5 interspace was identified and marked.  The incision was marked on the right and injected with local anesthetic. Once this was complete a 2 cm incision was opened with the use of a #10 blade knife.    The metrx tubes were sequentially advanced and confirmed in position at L4-5. An 65mm by 20mm tube was locked in place to the bed side attachment.  The  microscope was then sterilely brought into the field and muscle creep was hemostased with a bipolar and resected with a pituitary rongeur.  A Bovie extender was then used to expose the spinous process and lamina.  Careful attention was placed to not violate the facet capsule. A 3 mm matchstick drill bit was then used to make a hemi-laminotomy trough until the ligamentum flavum was exposed.  This was extended to the base of the spinous process and to the contralateral side to remove all the central bone from each side.  Once this was complete and the underlying ligamentum flavum was visualized, it was dissected with a curette and resected with Kerrison rongeurs.  Extensive ligamentum hypertrophy was noted, requiring a substantial amount of time and care for removal.  The dura was identified and palpated. The kerrison rongeur was then used to remove the medial facet bilaterally until no compression was noted.  A balltip probe was used to confirm decompression of the ipsilateral L5 nerve root.  Additional attention was paid to completion of the contralateral L4-5 foraminotomy until the contralateral traversing nerve root was completely free.  Once this was complete, L4-5 central decompression including medial facetectomy and foraminotomy was confirmed and decompression on both sides was confirmed. No CSF leak was noted.  A Depo-Medrol soaked Gelfoam pledget was placed in the defect.  The wound was copiously irrigated. The tube system was then removed under microscopic visualization and hemostasis was obtained with a bipolar.    The fascial layer was reapproximated with the use of a 0 Vicryl suture.  Subcutaneous tissue layer was reapproximated using 2-0 Vicryl suture.  3-0 monocryl was placed in subcuticular fashion. The skin was then cleansed and Dermabond was used to close the skin opening.  Patient was then rotated back to  the preoperative bed awakened from anesthesia and taken to recovery all counts are  correct in this case.  I performed the entire procedure with the assistance of Manning Charity PA as an Designer, television/film set.  Austan Nicholl K. Myer Haff MD

## 2021-01-22 NOTE — Discharge Summary (Signed)
Physician Discharge Summary  Patient ID: Megan [name] MRN: [id number] DOB/AGE: [birth year] 60 y.o.  Admit date: 01/22/2021 Discharge date: 01/22/2021  Admission Diagnoses: lumbar radiculopathy  Discharge Diagnoses:  Active Problems:   * No active hospital problems. *   Discharged Condition: good  Hospital Course: Megan [name] is a 60 y.o presenting with lumbar stenosis and radiculopathy s/p L4-5 decompression. Her interoperative course was uncomplicated and she was monitored in the PACU post-op. She was discharged home after ambulating, urinating, and tolerating PO intake with prescriptions for Oxycodone, Robaxin, and Senna.   Consults: None  Significant Diagnostic Studies: none  Treatments: surgery: As above. See separately dictated operative report for further details.   Discharge Exam: Blood pressure (!) 187/102, pulse 81, temperature 97.8 F (36.6 C), resp. rate 14, SpO2 100 %. CN II-XII grossly intact 5/5 strength BLE Incision c/d/I with dermabond in place.  Disposition: Discharge disposition: 01-Home or Self Care        Allergies as of 01/22/2021       Reactions   Hydrocodone-acetaminophen Shortness Of Breath   Headache, Vomiting, nightmares. Pt is able to take acetaminophen.   Latex Rash, Other (See Comments)   Blisters    Morphine Anaphylaxis, Shortness Of Breath, Nausea And Vomiting, Swelling, Other (See Comments)   Has and facial swelling   Nickel Swelling, Rash   Petrolatum Rash, Other (See Comments)   Blister All petroleum products per patient, facial swelling   Tape Rash   Pt can use paper tape   Hydromorphone Hcl Nausea And Vomiting   Bad headache   Nsaids    Avoid due to bariatric surgery   Other Other (See Comments)   Generic medications per patient- redness and burning in gums   Sulfa Antibiotics Rash           Medication List     TAKE these medications    carbamazepine 200 MG tablet Commonly known as: TEGRETOL Take 200 mg by mouth  at bedtime.   chlorthalidone 25 MG tablet Commonly known as: HYGROTON Take 25 mg by mouth daily.   estradiol 0.05 MG/24HR patch Commonly known as: VIVELLE-DOT Place 1 patch onto the skin 2 (two) times a week.   fluticasone 50 MCG/ACT nasal spray Commonly known as: FLONASE Place 2 sprays into both nostrils daily.   Fluticasone-Salmeterol 250-50 MCG/DOSE Aepb Commonly known as: ADVAIR Inhale 1 puff into the lungs daily as needed. Winter use   gabapentin 300 MG capsule Commonly known as: NEURONTIN Take 600 mg by mouth at bedtime.   levalbuterol 45 MCG/ACT inhaler Commonly known as: XOPENEX HFA Inhale 2 puffs into the lungs every 4 (four) hours as needed for wheezing.   levothyroxine 50 MCG tablet Commonly known as: SYNTHROID Take 50 mcg by mouth daily before breakfast.   losartan 50 MG tablet Commonly known as: COZAAR Take 50 mg by mouth at bedtime.   methocarbamol 500 MG tablet Commonly known as: Robaxin Take 1 tablet (500 mg total) by mouth 4 (four) times daily.   misoprostol 200 MCG tablet Commonly known as: CYTOTEC Take 200 mcg by mouth 4 (four) times daily.   montelukast 10 MG tablet Commonly known as: SINGULAIR Take 10 mg by mouth daily.   multivitamin with minerals Tabs tablet Take 1 tablet by mouth daily.   oxycodone 5 MG capsule Commonly known as: OXY-IR Take 1 capsule (5 mg total) by mouth every 6 (six) hours as needed for up to 5 days.   pantoprazole 40 MG tablet Commonly known as:  PROTONIX Take 40 mg by mouth 3 (three) times daily.   senna 8.6 MG Tabs tablet Commonly known as: SENOKOT Take 1 tablet (8.6 mg total) by mouth daily as needed for mild constipation.   sucralfate 1 g tablet Commonly known as: CARAFATE Take 1 g by mouth 4 (four) times daily as needed for indigestion.   Vitamin D3 75 MCG (3000 UT) Tabs Take 3,000 Units by mouth daily.        Follow-up Information     Susanne Borders, PA Follow up in 2 week(s).   Why: For  incision check. This should be schedule with [facility]. Please call the office with any questions regarding appointment date or time. Contact information: [address] [place] [address] [phone]                 Signed: Susanne Borders 01/22/2021, 12:06 PM

## 2021-01-22 NOTE — H&P (Signed)
I have reviewed and confirmed my history and physical from 01/03/21 with no additions or changes. Plan for R L4-5 decompression.  Risks and benefits reviewed.  Heart sounds normal no MRG. Chest Clear to Auscultation Bilaterally.

## 2021-01-22 NOTE — Discharge Instructions (Addendum)
Information for Discharge Teaching: EXPAREL (bupivacaine liposome injectable suspension); TEAL Bracelet - DO NOT REMOVE FOR 4 DAYS (96 hours)  Your surgeon or anesthesiologist gave you EXPAREL(bupivacaine) to help control your pain after surgery.  EXPAREL is a local anesthetic that provides pain relief by numbing the tissue around the surgical site. EXPAREL is designed to release pain medication over time and can control pain for up to 72 hours. Depending on how you respond to EXPAREL, you may require less pain medication during your recovery.  Possible side effects: Temporary loss of sensation or ability to move in the area where bupivacaine was injected. Nausea, vomiting, constipation Rarely, numbness and tingling in your mouth or lips, lightheadedness, or anxiety may occur. Call your doctor right away if you think you may be experiencing any of these sensations, or if you have other questions regarding possible side effects.  Follow all other discharge instructions given to you by your surgeon or nurse. Eat a healthy diet and drink plenty of water or other fluids.  If you return to the hospital for any reason within 96 hours following the administration of EXPAREL, it is important for health care providers to know that you have received this anesthetic. A teal colored band has been placed on your arm with the date, time and amount of EXPAREL you have received in order to alert and inform your health care providers. Please leave this armband in place for the full 96 hours following administration, and then you may remove the band.    Your surgeon has performed an operation on your lumbar spine (low back) to relieve pressure on one or more nerves. Many times, patients feel better immediately after surgery and can "overdo it." Even if you feel well, it is important that you follow these activity guidelines. If you do not let your back heal properly from the surgery, you can increase the chance of  a disc herniation and/or return of your symptoms. The following are instructions to help in your recovery once you have been discharged from the hospital.   Activity    No bending, lifting, or twisting ("BLT"). Avoid lifting objects heavier than 10 pounds (gallon milk jug).  Where possible, avoid household activities that involve lifting, bending, pushing, or pulling such as laundry, vacuuming, grocery shopping, and childcare. Try to arrange for help from friends and family for these activities while your back heals.  Increase physical activity slowly as tolerated.  Taking short walks is encouraged, but avoid strenuous exercise. Do not jog, run, bicycle, lift weights, or participate in any other exercises unless specifically allowed by your doctor. Avoid prolonged sitting, including car rides.  Talk to your doctor before resuming sexual activity.  You should not drive until cleared by your doctor.  Until released by your doctor, you should not return to work or school.  You should rest at home and let your body heal.   You may shower two days after your surgery.  After showering, lightly dab your incision dry. Do not take a tub bath or go swimming for 3 weeks, or until approved by your doctor at your follow-up appointment.  If you smoke, we strongly recommend that you quit.  Smoking has been proven to interfere with normal healing in your back and will dramatically reduce the success rate of your surgery. Please contact [facility] (800-QUIT-NOW) and use the resources at www.[facility].com for assistance in stopping smoking.  Surgical Incision   If you have a dressing on your incision, you may  remove it three days after your surgery. Keep your incision area clean and dry.  If you have staples or stitches on your incision, you should have a follow up scheduled for removal. If you do not have staples or stitches, you will have steri-strips (small pieces of surgical tape) or Dermabond glue. The  steri-strips/glue should begin to peel away within about a week (it is fine if the steri-strips fall off before then). If the strips are still in place one week after your surgery, you may gently remove them.  Diet            You may return to your usual diet. Be sure to stay hydrated.  When to Contact us  Although your surgery and recovery will likely be uneventful, you may have some residual numbness, aches, and pains in your back and/or legs. This is normal and should improve in the next few weeks.  However, should you experience any of the following, contact us immediately: New numbness or weakness Pain that is progressively getting worse, and is not relieved by your pain medications or rest Bleeding, redness, swelling, pain, or drainage from surgical incision Chills or flu-like symptoms Fever greater than 101.0 F (38.3 C) Problems with bowel or bladder functions Difficulty breathing or shortness of breath Warmth, tenderness, or swelling in your calf  Contact Information During office hours (Monday-Friday 9 am to 5 pm), please call your physician at [phone] After hours and weekends, please call [phone] and speak with the answering service, who will contact the doctor on call.  If that fails, call the [facility] Operator at [phone] and ask for the Neurosurgery Resident On Call  For a life-threatening emergency, call 911

## 2021-01-22 NOTE — Anesthesia Postprocedure Evaluation (Signed)
Anesthesia Post Note  Patient: Megan [name]  Procedure(s) Performed: L4-5 DECOMPRESSION (Right)  Patient location during evaluation: PACU Anesthesia Type: General Level of consciousness: awake and alert Pain management: pain level controlled Vital Signs Assessment: post-procedure vital signs reviewed and stable Respiratory status: spontaneous breathing, nonlabored ventilation, respiratory function stable and patient connected to nasal cannula oxygen Cardiovascular status: blood pressure returned to baseline and stable Postop Assessment: no apparent nausea or vomiting Anesthetic complications: no   No notable events documented.   Last Vitals:  Vitals:   01/22/21 1220 01/22/21 1225  BP: (!) 151/88   Pulse: 73 71  Resp: 10 (!) 8  Temp:    SpO2: 100% 100%    Last Pain:  Vitals:   01/22/21 1210  PainSc: 0-No pain                 Corinda Gubler

## 2021-01-23 ENCOUNTER — Encounter: Payer: Self-pay | Admitting: Neurosurgery

## 2021-01-31 ENCOUNTER — Ambulatory Visit: Payer: BC Managed Care – PPO | Admitting: Urology

## 2021-02-06 ENCOUNTER — Other Ambulatory Visit: Payer: Self-pay | Admitting: *Deleted

## 2021-02-06 DIAGNOSIS — N200 Calculus of kidney: Secondary | ICD-10-CM

## 2021-02-10 ENCOUNTER — Ambulatory Visit
Admission: RE | Admit: 2021-02-10 | Discharge: 2021-02-10 | Disposition: A | Discharge status: Home / Self Care | Payer: BC Managed Care – PPO | Source: Ambulatory Visit | Attending: Urology | Admitting: Urology

## 2021-02-10 ENCOUNTER — Ambulatory Visit
Admission: RE | Admit: 2021-02-10 | Discharge: 2021-02-10 | Disposition: A | Discharge status: Home / Self Care | Payer: BC Managed Care – PPO | Attending: Urology | Admitting: Urology

## 2021-02-10 DIAGNOSIS — N200 Calculus of kidney: Secondary | ICD-10-CM | POA: Insufficient documentation

## 2021-02-10 NOTE — Progress Notes (Signed)
02/11/21 1:47 PM   Megan [name] November 04, 1960 [id number]  Referring provider:  Mick Sell, MD [address] [place],  [place] [address] Chief Complaint  Patient presents with   Nephrolithiasis     HPI: Megan [name] is a 60 y.o.female with a personal history of medullary sponge kidney, recurrent nephrolithiasis, and recurrent UTIs, who presents today for 1 year follow-up with KUB.   Per CT stone study dated 06/26/2019, she was noted to have multiple bilateral nonobstructing kidney stones, largest on the right measuring 5 mm.  She subsequently underwent right URS/LL/stent placement with Dr. Apolinar Junes to treat a distal right ureteral stone and multiple right renal stones.  Follow-up renal ultrasound dated 08/02/2019 with no right-sided stones visualized.  KUB on 02/03/2020 revealed bilateral nephrolithiasis, similar to prior study.   She underwent a CT renal stone study for right flank pain that revealed a 3 mm calculus at the right UVJ with no hydronephrosis. As well as multiple additional small bilateral nonobstructive renal calculi.   KUB today showed multiple small bilateral stone no greater than 3 mm. This was in comparison to CT in 05/2020 and is unchanged.   She is doing well today. She reports that she feels like she passed a stone yesterday or could still be passing it. She had pain for two weeks and yesterday was the worse pain.  Today, it is improving.  She reports that she is seeing a nephrologist to create a dietary plan.  She is also recently had a 24-hour urine metabolic evaluation for stone prevention.   PMH: Past Medical History:  Diagnosis Date   Anemia    Arthritis    Asthma    DVT (deep venous thrombosis) (HCC) 2008   left femeral s/p femeral osteotomy   Family history of adverse reaction to anesthesia    mother had reaction but doesn't know what it is   GERD (gastroesophageal reflux disease)    Heart murmur    History of kidney stones     Hypertension    Hypothyroidism    Pneumonia    Polycystic ovary    PONV (postoperative nausea and vomiting)    Renal disorder    Thyroid disease     Surgical History: Past Surgical History:  Procedure Laterality Date   ABDOMINAL HYSTERECTOMY     APPENDECTOMY  1980   CYSTOSCOPY/URETEROSCOPY/HOLMIUM LASER/STENT PLACEMENT Right 06/29/2019   Procedure: CYSTOSCOPY/URETEROSCOPY/HOLMIUM LASER/STENT PLACEMENT;  Surgeon: Vanna Scotland, MD;  Location: [facility] OR[facility];  Service: Urology;  Laterality: Right;   CYSTOSCOPY/URETEROSCOPY/HOLMIUM LASER/STENT PLACEMENT  07/15/2020   ESOPHAGOGASTRODUODENOSCOPY     2021, 08/16/2020, 11/13/2020   FEMORAL OSTEOTOMY W/ APPLICATION EXTERNAL FIXATOR Right    KNEE ARTHROSCOPY W/ ACL RECONSTRUCTION Left    KNEE ARTHROSCOPY W/ HARDWARE REMOVAL Right 01/13/2017   LAPAROSCOPIC GASTRIC BYPASS  02/12/2020   LITHOTRIPSY     LUMBAR LAMINECTOMY/DECOMPRESSION MICRODISCECTOMY Right 01/22/2021   Procedure: L4-5 DECOMPRESSION;  Surgeon: Venetia Night, MD;  Location: [facility] ORS;  Service: Neurosurgery;  Laterality: Right;   OSTEOTOMY Bilateral 2017   femoral shaft/supracondylar   ROTATOR CUFF REPAIR Right    TONSILLECTOMY  1964   TOTAL KNEE ARTHROPLASTY Right 11/11/2018    Home Medications:  Allergies as of 02/11/2021       Reactions   Hydrocodone-acetaminophen Shortness Of Breath   Headache, Vomiting, nightmares. Pt is able to take acetaminophen.   Latex Rash, Other (See Comments)   Blisters    Morphine Anaphylaxis, Shortness Of Breath, Nausea And Vomiting, Swelling, Other (See  Comments)   Has and facial swelling   Nickel Swelling, Rash   Petrolatum Rash, Other (See Comments)   Blister All petroleum products per patient, facial swelling   Tape Rash   Pt can use paper tape Other reaction(s): Other (See Comments) Blisters, tegaderm okay Prefers paper tape   Erythromycin Nausea And Vomiting, Nausea Only   Hydromorphone Hcl Nausea And Vomiting   Bad headache    Nsaids    Avoid due to bariatric surgery   Other Other (See Comments)   Generic medications per patient- redness and burning in gums   Sulfa Antibiotics Rash           Medication List        Accurate as of February 11, 2021  1:47 PM. If you have any questions, ask your nurse or doctor.          STOP taking these medications    senna 8.6 MG Tabs tablet Commonly known as: SENOKOT Stopped by: Vanna Scotland, MD       TAKE these medications    budesonide 0.5 MG/2ML nebulizer solution Commonly known as: PULMICORT Take by nebulization.   carbamazepine 200 MG tablet Commonly known as: TEGRETOL Take 200 mg by mouth at bedtime.   chlorthalidone 25 MG tablet Commonly known as: HYGROTON Take 25 mg by mouth daily.   Desoximetasone 0.05 % Gel Apply topically.   estradiol 0.05 MG/24HR patch Commonly known as: VIVELLE-DOT Place 1 patch onto the skin 2 (two) times a week.   fluticasone 50 MCG/ACT nasal spray Commonly known as: FLONASE Place 2 sprays into both nostrils daily.   Fluticasone-Salmeterol 250-50 MCG/DOSE Aepb Commonly known as: ADVAIR Inhale 1 puff into the lungs daily as needed. Winter use   gabapentin 300 MG capsule Commonly known as: NEURONTIN Take 600 mg by mouth at bedtime.   levalbuterol 45 MCG/ACT inhaler Commonly known as: XOPENEX HFA Inhale 2 puffs into the lungs every 4 (four) hours as needed for wheezing.   levothyroxine 50 MCG tablet Commonly known as: SYNTHROID Take 50 mcg by mouth daily before breakfast.   losartan 50 MG tablet Commonly known as: COZAAR Take 50 mg by mouth at bedtime.   methocarbamol 500 MG tablet Commonly known as: Robaxin Take 1 tablet (500 mg total) by mouth 4 (four) times daily.   misoprostol 200 MCG tablet Commonly known as: CYTOTEC Take 200 mcg by mouth 4 (four) times daily.   montelukast 10 MG tablet Commonly known as: SINGULAIR Take 10 mg by mouth daily.   multivitamin with minerals Tabs  tablet Take 1 tablet by mouth daily.   MULTIVITAMIN ADULT PO Take 1 tablet by mouth daily.   pantoprazole 40 MG tablet Commonly known as: PROTONIX Take 40 mg by mouth 3 (three) times daily.   sucralfate 1 g tablet Commonly known as: CARAFATE Take 1 g by mouth 4 (four) times daily as needed for indigestion.   traMADol 50 MG tablet Commonly known as: ULTRAM Take 50 mg by mouth every 6 (six) hours as needed.   Vitamin D3 75 MCG (3000 UT) Tabs Take 3,000 Units by mouth daily.        Allergies:  Allergies  Allergen Reactions   Hydrocodone-Acetaminophen Shortness Of Breath    Headache, Vomiting, nightmares. Pt is able to take acetaminophen.     Latex Rash and Other (See Comments)    Blisters      Morphine Anaphylaxis, Shortness Of Breath, Nausea And Vomiting, Swelling and Other (See Comments)  Has and facial swelling    Nickel Swelling and Rash   Petrolatum Rash and Other (See Comments)    Blister All petroleum products per patient, facial swelling    Tape Rash    Pt can use paper tape Other reaction(s): Other (See Comments) Blisters, tegaderm okay Prefers paper tape   Erythromycin Nausea And Vomiting and Nausea Only   Hydromorphone Hcl Nausea And Vomiting    Bad headache   Nsaids     Avoid due to bariatric surgery   Other Other (See Comments)    Generic medications per patient- redness and burning in gums    Sulfa Antibiotics Rash         Family History: No family history on file.  Social History:  reports that she has never smoked. She has never used smokeless tobacco. She reports that she does not currently use alcohol. She reports that she does not use drugs.   Physical Exam: BP 139/81   Pulse 61   Ht 5\' 2"  (1.575 m)   Wt 150 lb (68 kg)   BMI 27.44 kg/m   Constitutional:  Alert and oriented, No acute distress. HEENT: [place] AT, moist mucus membranes.  Trachea midline, no masses. Cardiovascular: No clubbing, cyanosis, or edema. Respiratory:  Normal respiratory effort, no increased work of breathing. Skin: No rashes, bruises or suspicious lesions. Neurologic: Grossly intact, no focal deficits, moving all 4 extremities. Psychiatric: Normal mood and affect.  Laboratory Data:  Lab Results  Component Value Date   CREATININE 0.79 01/15/2021    Urinalysis Urinalysis today with greater than 30 RBCs  Pertinent Imaging: CLINICAL DATA:  Kidney stones.   EXAM: ABDOMEN - 1 VIEW   COMPARISON:  11/29/2019   FINDINGS: Small stones project over the kidneys bilaterally measuring up to approximately 2-3 mm on the right and 4 mm on the left. There are calcifications projecting over the patient's pelvis that are stable from prior study. There are mild degenerative changes throughout the lumbar spine and bilateral hips. The bowel gas pattern is nonobstructive. The stool burden is moderate.   IMPRESSION: Bilateral nephrolithiasis, similar to prior study.     Electronically Signed   By: 01/29/2020 M.D.   On: 02/03/2020 20:28  The above KUB was personally reviewed today.  Compared to her previous scan from earlier this year, overall stone burden is similar/unchanged.  Assessment & Plan:    History of recurrent nephrolithiasis  - KUB today shows punctate stone in the right kidney, no left kidney stone, no ureteral calculi. Imaging is unchanged in comparison to 05/2020 CT.   -Agree with continued follow-up with nephrology for stone prevention/dietary counseling, etc.  -Pain is subsiding, suspect this is a small stone.  Conservative management for the time being and she will let 06/2020 know if her pain worsens or fails to resolve.  2.  Microscopic hematuria -Likely related to an acute stone event, not clearly seen on KUB although this was a few days ago   F/u 1 year with KUB or sooner as needed  I,Kailey Littlejohn,acting as a scribe for [place], MD.,have documented all relevant documentation on the behalf of  Vanna Scotland, MD,as directed by  Vanna Scotland, MD while in the presence of Vanna Scotland, MD.  I have reviewed the above documentation for accuracy and completeness, and I agree with the above.   Vanna Scotland, MD    [facility] [address], [address] [place], [place] [phone]

## 2021-02-11 ENCOUNTER — Encounter: Payer: Self-pay | Admitting: Urology

## 2021-02-11 ENCOUNTER — Other Ambulatory Visit: Payer: Self-pay

## 2021-02-11 ENCOUNTER — Ambulatory Visit (INDEPENDENT_AMBULATORY_CARE_PROVIDER_SITE_OTHER): Payer: BC Managed Care – PPO | Admitting: Urology

## 2021-02-11 ENCOUNTER — Telehealth: Payer: Self-pay

## 2021-02-11 VITALS — BP 139/81 | HR 61 | Ht 62.0 in | Wt 150.0 lb

## 2021-02-11 DIAGNOSIS — N200 Calculus of kidney: Secondary | ICD-10-CM

## 2021-02-11 LAB — URINALYSIS, COMPLETE
Bilirubin, UA: NEGATIVE
Glucose, UA: NEGATIVE
Ketones, UA: NEGATIVE
Leukocytes,UA: NEGATIVE
Nitrite, UA: NEGATIVE
Specific Gravity, UA: 1.025 (ref 1.005–1.030)
Urobilinogen, Ur: 0.2 mg/dL (ref 0.2–1.0)
pH, UA: 5.5 (ref 5.0–7.5)

## 2021-02-11 LAB — MICROSCOPIC EXAMINATION
Bacteria, UA: NONE SEEN
RBC, Urine: >30 /hpf — ABNORMAL HIGH (ref 0–2)

## 2021-02-11 NOTE — Telephone Encounter (Signed)
As per Dr. Apolinar Junes pt does have blood in urine, actively passing a stone. If symptoms worsen or persist please contact office.  Left detailed message for patient.

## 2021-06-20 ENCOUNTER — Other Ambulatory Visit: Payer: Self-pay | Admitting: Neurosurgery

## 2021-06-20 DIAGNOSIS — G894 Chronic pain syndrome: Secondary | ICD-10-CM

## 2021-06-20 DIAGNOSIS — G500 Trigeminal neuralgia: Secondary | ICD-10-CM

## 2021-07-02 ENCOUNTER — Other Ambulatory Visit: Payer: Self-pay

## 2021-07-02 ENCOUNTER — Ambulatory Visit
Admission: RE | Admit: 2021-07-02 | Discharge: 2021-07-02 | Disposition: A | Discharge status: Home / Self Care | Payer: BC Managed Care – PPO | Source: Ambulatory Visit | Attending: Neurosurgery | Admitting: Neurosurgery

## 2021-07-02 DIAGNOSIS — G500 Trigeminal neuralgia: Secondary | ICD-10-CM | POA: Insufficient documentation

## 2021-07-02 DIAGNOSIS — G894 Chronic pain syndrome: Secondary | ICD-10-CM | POA: Insufficient documentation

## 2021-08-19 ENCOUNTER — Other Ambulatory Visit: Payer: Self-pay | Admitting: Neurosurgery

## 2021-08-19 DIAGNOSIS — Z01818 Encounter for other preprocedural examination: Secondary | ICD-10-CM

## 2021-08-22 ENCOUNTER — Encounter
Admission: RE | Admit: 2021-08-22 | Discharge: 2021-08-22 | Disposition: A | Discharge status: Home / Self Care | Payer: BC Managed Care – PPO | Source: Ambulatory Visit | Attending: Neurosurgery | Admitting: Neurosurgery

## 2021-08-22 VITALS — BP 179/91 | HR 65 | Resp 16 | Ht 63.0 in | Wt 140.7 lb

## 2021-08-22 DIAGNOSIS — Z79899 Other long term (current) drug therapy: Secondary | ICD-10-CM | POA: Diagnosis not present

## 2021-08-22 DIAGNOSIS — Q615 Medullary cystic kidney: Secondary | ICD-10-CM

## 2021-08-22 DIAGNOSIS — Z01818 Encounter for other preprocedural examination: Secondary | ICD-10-CM

## 2021-08-22 DIAGNOSIS — I10 Essential (primary) hypertension: Secondary | ICD-10-CM | POA: Insufficient documentation

## 2021-08-22 HISTORY — DX: Other intervertebral disc degeneration, lumbosacral region: M51.37

## 2021-08-22 HISTORY — DX: Spinal stenosis, lumbar region without neurogenic claudication: M48.061

## 2021-08-22 HISTORY — DX: Other intervertebral disc degeneration, lumbosacral region without mention of lumbar back pain or lower extremity pain: M51.379

## 2021-08-22 HISTORY — DX: Spondylosis without myelopathy or radiculopathy, lumbar region: M47.816

## 2021-08-22 HISTORY — DX: Prediabetes: R73.03

## 2021-08-22 HISTORY — DX: Medullary cystic kidney: Q61.5

## 2021-08-22 HISTORY — DX: Trigeminal neuralgia: G50.0

## 2021-08-22 LAB — CBC
HCT: 40.7 % (ref 36.0–46.0)
Hemoglobin: 13 g/dL (ref 12.0–15.0)
MCH: 29.9 pg (ref 26.0–34.0)
MCHC: 31.9 g/dL (ref 30.0–36.0)
MCV: 93.6 fL (ref 80.0–100.0)
Platelets: 250 10*3/uL (ref 150–400)
RBC: 4.35 MIL/uL (ref 3.87–5.11)
RDW: 14.8 % (ref 11.5–15.5)
WBC: 5.8 10*3/uL (ref 4.0–10.5)
nRBC: 0 % (ref 0.0–0.2)

## 2021-08-22 LAB — BASIC METABOLIC PANEL
Anion gap: 9 (ref 5–15)
BUN: 16 mg/dL (ref 8–23)
CO2: 27 mmol/L (ref 22–32)
Calcium: 9.5 mg/dL (ref 8.9–10.3)
Chloride: 105 mmol/L (ref 98–111)
Creatinine, Ser: 0.72 mg/dL (ref 0.44–1.00)
GFR, Estimated: >60 mL/min (ref >60)
Glucose, Bld: 96 mg/dL (ref 70–99)
Potassium: 4.2 mmol/L (ref 3.5–5.1)
Sodium: 141 mmol/L (ref 135–145)

## 2021-08-22 LAB — SURGICAL PCR SCREEN
MRSA, PCR: NEGATIVE
Staphylococcus aureus: POSITIVE — AB

## 2021-08-22 NOTE — Patient Instructions (Addendum)
Your procedure is scheduled on:09-03-21 Wednesday ?Report to the Registration Desk on the 1st floor of the [facility].Then proceed to the 2nd floor Surgery Desk ?To find out your arrival time, please call [phone] between 1PM - 3PM on:09-02-21 Tuesday ?If your arrival time is 6:00 am, do not arrive prior to that time as the [facility] entrance doors do not open until 6:00 am. ? ?REMEMBER: ?Instructions that are not followed completely may result in serious medical risk, up to and including death; or upon the discretion of your surgeon and anesthesiologist your surgery may need to be rescheduled. ? ?Do not eat food after midnight the night before surgery.  ?No gum chewing, lozengers or hard candies. ? ?You may however, drink CLEAR liquids up to 2 hours before you are scheduled to arrive for your surgery. Do not drink anything within 2 hours of your scheduled arrival time. ? ?Clear liquids include: ?- water  ?- apple juice without pulp ?- gatorade (not RED colors) ?- black coffee or tea (Do NOT add milk or creamers to the coffee or tea) ?Do NOT drink anything that is not on this list. ? ?TAKE THESE MEDICATIONS THE MORNING OF SURGERY WITH A SIP OF WATER: ?-levothyroxine (SYNTHROID) ?-methocarbamol (ROBAXIN)   ?-pantoprazole (PROTONIX) ? ?Use your budesonide (PULMICORT) Nebulizer, Advair Inhaler and levalbuterol (XOPENEX HFA) Inhaler the day of surgery and bring your Xopenex inhaler to the hospital ? ?One week prior to surgery:Last dose on 08-26-21 ?Stop Anti-inflammatories (NSAIDS) such as Advil, Aleve, Ibuprofen, Motrin, Naproxen, Naprosyn and Aspirin based products such as Excedrin, Goodys Powder, BC Powder.You may however, take Tylenol/Tylenol if needed for pain up until the day of surgery. ?Stop ANY OVER THE COUNTER supplements/vitamins 7 days prior to surgery (Multiple Vitamin , VITAMIN D3) ? ?No Alcohol for 24 hours before or after surgery. ? ?No Smoking including e-cigarettes for 24 hours prior to surgery.   ?No chewable tobacco products for at least 6 hours prior to surgery.  ?No nicotine patches on the day of surgery. ? ?Do not use any "recreational" drugs for at least a week prior to your surgery.  ?Please be advised that the combination of cocaine and anesthesia may have negative outcomes, up to and including death. ?If you test positive for cocaine, your surgery will be cancelled. ? ?On the morning of surgery brush your teeth with toothpaste and water, you may rinse your mouth with mouthwash if you wish. ?Do not swallow any toothpaste or mouthwash. ? ?Use CHG Soap as directed on instruction sheet. ? ?Do not wear jewelry, make-up, hairpins, clips or nail polish. ? ?Do not wear lotions, powders, or perfumes.  ? ?Do not shave body from the neck down 48 hours prior to surgery just in case you cut yourself which could leave a site for infection.  ?Also, freshly shaved skin may become irritated if using the CHG soap. ? ?Contact lenses, hearing aids and dentures may not be worn into surgery. ? ?Do not bring valuables to the hospital. [facility] is not responsible for any missing/lost belongings or valuables.  ? ?Notify your doctor if there is any change in your medical condition (cold, fever, infection). ? ?Wear comfortable clothing (specific to your surgery type) to the hospital. ? ?After surgery, you can help prevent lung complications by doing breathing exercises.  ?Take deep breaths and cough every 1-2 hours. ?When coughing or sneezing, hold a pillow firmly against your incision with both hands. This is called ?splinting.? Doing this helps protect your  incision. It also decreases belly discomfort. ? ?If you are being admitted to the hospital overnight, leave your suitcase in the car. ?After surgery it may be brought to your room. ? ?If you are being discharged the day of surgery, you will not be allowed to drive home. ?You will need a responsible adult (18 years or older) to drive you home and stay with you that  night.  ? ?If you are taking public transportation, you will need to have a responsible adult (18 years or older) with you. ?Please confirm with your physician that it is acceptable to use public transportation.  ? ?Please call the Pre-admissions Testing Dept. at [phone] if you have any questions about these instructions. ? ?Surgery Visitation Policy: ? ?Patients undergoing a surgery or procedure may have two family members or support persons with them as long as the person is not COVID-19 positive or experiencing its symptoms.  ?

## 2021-09-03 ENCOUNTER — Observation Stay
Admission: RE | Admit: 2021-09-03 | Discharge: 2021-09-04 | Disposition: A | Discharge status: Home / Self Care | Payer: BC Managed Care – PPO | Attending: Neurosurgery | Admitting: Neurosurgery

## 2021-09-03 ENCOUNTER — Ambulatory Visit: Payer: BC Managed Care – PPO | Admitting: Anesthesiology

## 2021-09-03 ENCOUNTER — Encounter
Admission: RE | Disposition: A | Discharge status: Home / Self Care | Payer: Self-pay | Source: Home / Self Care | Attending: Neurosurgery

## 2021-09-03 ENCOUNTER — Other Ambulatory Visit: Payer: Self-pay

## 2021-09-03 ENCOUNTER — Ambulatory Visit: Payer: BC Managed Care – PPO

## 2021-09-03 ENCOUNTER — Encounter: Payer: Self-pay | Admitting: Neurosurgery

## 2021-09-03 DIAGNOSIS — Z91040 Latex allergy status: Secondary | ICD-10-CM | POA: Insufficient documentation

## 2021-09-03 DIAGNOSIS — M545 Low back pain, unspecified: Secondary | ICD-10-CM

## 2021-09-03 DIAGNOSIS — G894 Chronic pain syndrome: Secondary | ICD-10-CM | POA: Diagnosis present

## 2021-09-03 DIAGNOSIS — N200 Calculus of kidney: Secondary | ICD-10-CM

## 2021-09-03 HISTORY — PX: THORACIC LAMINECTOMY FOR SPINAL CORD STIMULATOR: SHX6887

## 2021-09-03 HISTORY — PX: PULSE GENERATOR IMPLANT: SHX5370

## 2021-09-03 SURGERY — THORACIC LAMINECTOMY FOR SPINAL CORD STIMULATOR
Anesthesia: General

## 2021-09-03 MED ORDER — SODIUM CHLORIDE 0.9 % IV SOLN: 250.0000 mL | INTRAVENOUS | Status: DC

## 2021-09-03 MED ORDER — ONDANSETRON HCL 4 MG/2ML IJ SOLN
4.0000 mg | Freq: Four times a day (QID) | INTRAMUSCULAR | Status: DC | PRN
  Administered 2021-09-03: 4 mg via INTRAVENOUS

## 2021-09-03 MED ORDER — FENTANYL CITRATE (PF) 100 MCG/2ML IJ SOLN
INTRAMUSCULAR | Status: AC
  Administered 2021-09-03: 50 ug via INTRAVENOUS
  Filled 2021-09-03: qty 2

## 2021-09-03 MED ORDER — GABAPENTIN 300 MG PO CAPS
ORAL_CAPSULE | ORAL | Status: AC
  Administered 2021-09-03: 600 mg via ORAL
  Filled 2021-09-03: qty 2

## 2021-09-03 MED ORDER — ADULT MULTIVITAMIN W/MINERALS CH
1.0000 | ORAL_TABLET | Freq: Every day | ORAL | Status: DC
  Administered 2021-09-03: 1 via ORAL
  Filled 2021-09-03 (×2): qty 1

## 2021-09-03 MED ORDER — BUDESONIDE 0.5 MG/2ML IN SUSP
0.5000 mg | Freq: Every day | RESPIRATORY_TRACT | Status: DC | PRN
  Filled 2021-09-03: qty 2

## 2021-09-03 MED ORDER — OXYCODONE HCL 5 MG PO TABS
ORAL_TABLET | ORAL | Status: AC
  Administered 2021-09-03: 10 mg via ORAL
  Filled 2021-09-03: qty 2

## 2021-09-03 MED ORDER — PROPOFOL 10 MG/ML IV BOLUS
INTRAVENOUS | Status: AC
  Filled 2021-09-03: qty 20

## 2021-09-03 MED ORDER — LIDOCAINE HCL (CARDIAC) PF 100 MG/5ML IV SOSY
PREFILLED_SYRINGE | INTRAVENOUS | Status: DC | PRN
  Administered 2021-09-03: 60 mg via INTRAVENOUS

## 2021-09-03 MED ORDER — SENNOSIDES-DOCUSATE SODIUM 8.6-50 MG PO TABS: 1.0000 | ORAL_TABLET | Freq: Every evening | ORAL | Status: DC | PRN

## 2021-09-03 MED ORDER — METHOCARBAMOL 1000 MG/10ML IJ SOLN
500.0000 mg | Freq: Four times a day (QID) | INTRAVENOUS | Status: DC | PRN
  Administered 2021-09-03: 500 mg via INTRAVENOUS
  Filled 2021-09-03: qty 5
  Filled 2021-09-03: qty 500

## 2021-09-03 MED ORDER — METHOCARBAMOL 500 MG PO TABS
500.0000 mg | ORAL_TABLET | Freq: Four times a day (QID) | ORAL | Status: DC | PRN
Start: 2021-09-03 — End: 2021-09-04

## 2021-09-03 MED ORDER — REMIFENTANIL HCL 1 MG IV SOLR
INTRAVENOUS | Status: AC
  Filled 2021-09-03: qty 1000

## 2021-09-03 MED ORDER — SODIUM CHLORIDE FLUSH 0.9 % IV SOLN
INTRAVENOUS | Status: AC
  Filled 2021-09-03: qty 10

## 2021-09-03 MED ORDER — ONDANSETRON HCL 4 MG PO TABS: 4.0000 mg | ORAL_TABLET | Freq: Four times a day (QID) | ORAL | Status: DC | PRN

## 2021-09-03 MED ORDER — BUPIVACAINE-EPINEPHRINE (PF) 0.5% -1:200000 IJ SOLN
INTRAMUSCULAR | Status: AC
  Filled 2021-09-03: qty 30

## 2021-09-03 MED ORDER — LEVALBUTEROL HCL 0.63 MG/3ML IN NEBU: 0.6300 mg | INHALATION_SOLUTION | RESPIRATORY_TRACT | Status: DC | PRN

## 2021-09-03 MED ORDER — PANTOPRAZOLE SODIUM 40 MG PO TBEC
DELAYED_RELEASE_TABLET | ORAL | Status: AC
  Administered 2021-09-03: 40 mg via ORAL
  Filled 2021-09-03: qty 1

## 2021-09-03 MED ORDER — ACETAMINOPHEN 500 MG PO TABS
ORAL_TABLET | ORAL | Status: AC
  Administered 2021-09-03: 1000 mg via ORAL
  Filled 2021-09-03: qty 2

## 2021-09-03 MED ORDER — PHENYLEPHRINE 80 MCG/ML (10ML) SYRINGE FOR IV PUSH (FOR BLOOD PRESSURE SUPPORT)
PREFILLED_SYRINGE | INTRAVENOUS | Status: DC | PRN
  Administered 2021-09-03: 80 ug via INTRAVENOUS
  Administered 2021-09-03: 160 ug via INTRAVENOUS

## 2021-09-03 MED ORDER — PROPOFOL 500 MG/50ML IV EMUL
INTRAVENOUS | Status: DC | PRN
  Administered 2021-09-03: 150 ug/kg/min via INTRAVENOUS

## 2021-09-03 MED ORDER — ACETAMINOPHEN 500 MG PO TABS
ORAL_TABLET | ORAL | Status: AC
Start: 2021-09-03 — End: 2021-09-03
  Filled 2021-09-03: qty 2

## 2021-09-03 MED ORDER — MENTHOL 3 MG MT LOZG: 1.0000 | LOZENGE | OROMUCOSAL | Status: DC | PRN

## 2021-09-03 MED ORDER — SODIUM CHLORIDE 0.9% FLUSH: 3.0000 mL | Freq: Two times a day (BID) | INTRAVENOUS | Status: DC

## 2021-09-03 MED ORDER — MOMETASONE FURO-FORMOTEROL FUM 200-5 MCG/ACT IN AERO
2.0000 | INHALATION_SPRAY | Freq: Two times a day (BID) | RESPIRATORY_TRACT | Status: DC
  Administered 2021-09-03 – 2021-09-04 (×2): 2 via RESPIRATORY_TRACT
  Filled 2021-09-03: qty 8.8

## 2021-09-03 MED ORDER — PROPOFOL 1000 MG/100ML IV EMUL
INTRAVENOUS | Status: AC
  Filled 2021-09-03: qty 100

## 2021-09-03 MED ORDER — 0.9 % SODIUM CHLORIDE (POUR BTL) OPTIME
TOPICAL | Status: DC | PRN
  Administered 2021-09-03: 1000 mL

## 2021-09-03 MED ORDER — ACETAMINOPHEN 325 MG PO TABS: 650.0000 mg | ORAL_TABLET | ORAL | Status: DC | PRN

## 2021-09-03 MED ORDER — VANCOMYCIN HCL IN DEXTROSE 1-5 GM/200ML-% IV SOLN
INTRAVENOUS | Status: AC
  Filled 2021-09-03: qty 200

## 2021-09-03 MED ORDER — CHLORTHALIDONE 25 MG PO TABS
25.0000 mg | ORAL_TABLET | Freq: Every day | ORAL | Status: DC
  Administered 2021-09-03: 25 mg via ORAL
  Filled 2021-09-03 (×2): qty 1

## 2021-09-03 MED ORDER — ORAL CARE MOUTH RINSE: 15.0000 mL | Freq: Once | OROMUCOSAL | Status: AC

## 2021-09-03 MED ORDER — SODIUM CHLORIDE FLUSH 0.9 % IV SOLN
INTRAVENOUS | Status: AC
  Filled 2021-09-03: qty 20

## 2021-09-03 MED ORDER — MIDAZOLAM HCL 2 MG/2ML IJ SOLN
INTRAMUSCULAR | Status: AC
  Filled 2021-09-03: qty 2

## 2021-09-03 MED ORDER — GABAPENTIN 300 MG PO CAPS
ORAL_CAPSULE | ORAL | Status: AC
  Filled 2021-09-03: qty 2

## 2021-09-03 MED ORDER — ACETAMINOPHEN 10 MG/ML IV SOLN
INTRAVENOUS | Status: DC | PRN
Start: 2021-09-03 — End: 2021-09-03
  Administered 2021-09-03: 1000 mg via INTRAVENOUS

## 2021-09-03 MED ORDER — CARBAMAZEPINE 200 MG PO TABS
200.0000 mg | ORAL_TABLET | Freq: Every day | ORAL | Status: DC
  Administered 2021-09-03: 200 mg via ORAL
  Filled 2021-09-03 (×2): qty 1

## 2021-09-03 MED ORDER — PHENOL 1.4 % MT LIQD: 1.0000 | OROMUCOSAL | Status: DC | PRN

## 2021-09-03 MED ORDER — LABETALOL HCL 5 MG/ML IV SOLN: 10.0000 mg | Freq: Once | INTRAVENOUS | Status: AC

## 2021-09-03 MED ORDER — POTASSIUM CHLORIDE IN NACL 20-0.9 MEQ/L-% IV SOLN
INTRAVENOUS | Status: DC
  Filled 2021-09-03 (×3): qty 1000

## 2021-09-03 MED ORDER — LACTATED RINGERS IV SOLN: INTRAVENOUS | Status: DC

## 2021-09-03 MED ORDER — VANCOMYCIN HCL IN DEXTROSE 1-5 GM/200ML-% IV SOLN
1000.0000 mg | INTRAVENOUS | Status: AC
  Administered 2021-09-03: 1000 mg via INTRAVENOUS

## 2021-09-03 MED ORDER — ESTRADIOL 1 MG PO TABS
1.0000 mg | ORAL_TABLET | Freq: Every day | ORAL | Status: DC
  Administered 2021-09-03 – 2021-09-04 (×2): 1 mg via ORAL
  Filled 2021-09-03 (×2): qty 1

## 2021-09-03 MED ORDER — LOSARTAN POTASSIUM 50 MG PO TABS
75.0000 mg | ORAL_TABLET | Freq: Every day | ORAL | Status: DC
  Administered 2021-09-03: 75 mg via ORAL
  Filled 2021-09-03 (×2): qty 1

## 2021-09-03 MED ORDER — SUCCINYLCHOLINE CHLORIDE 200 MG/10ML IV SOSY
PREFILLED_SYRINGE | INTRAVENOUS | Status: DC | PRN
  Administered 2021-09-03: 100 mg via INTRAVENOUS

## 2021-09-03 MED ORDER — SUCCINYLCHOLINE CHLORIDE 200 MG/10ML IV SOSY
PREFILLED_SYRINGE | INTRAVENOUS | Status: AC
  Filled 2021-09-03: qty 10

## 2021-09-03 MED ORDER — BUPIVACAINE HCL (PF) 0.5 % IJ SOLN
INTRAMUSCULAR | Status: AC
  Filled 2021-09-03: qty 30

## 2021-09-03 MED ORDER — METHOCARBAMOL 500 MG PO TABS
ORAL_TABLET | ORAL | Status: AC
  Administered 2021-09-03: 500 mg via ORAL
  Filled 2021-09-03: qty 1

## 2021-09-03 MED ORDER — FENTANYL CITRATE (PF) 100 MCG/2ML IJ SOLN
INTRAMUSCULAR | Status: DC | PRN
  Administered 2021-09-03 (×2): 50 ug via INTRAVENOUS

## 2021-09-03 MED ORDER — PROMETHAZINE HCL 25 MG/ML IJ SOLN: 6.2500 mg | INTRAMUSCULAR | Status: DC | PRN

## 2021-09-03 MED ORDER — FENTANYL CITRATE (PF) 100 MCG/2ML IJ SOLN
25.0000 ug | INTRAMUSCULAR | Status: DC | PRN
  Administered 2021-09-03: 50 ug via INTRAVENOUS

## 2021-09-03 MED ORDER — MONTELUKAST SODIUM 10 MG PO TABS
10.0000 mg | ORAL_TABLET | Freq: Every day | ORAL | Status: DC
  Administered 2021-09-03: 10 mg via ORAL
  Filled 2021-09-03 (×2): qty 1

## 2021-09-03 MED ORDER — FLUTICASONE PROPIONATE 50 MCG/ACT NA SUSP: 2.0000 | Freq: Every day | NASAL | Status: DC | PRN

## 2021-09-03 MED ORDER — ONDANSETRON HCL 4 MG/2ML IJ SOLN
INTRAMUSCULAR | Status: DC | PRN
  Administered 2021-09-03: 4 mg via INTRAVENOUS

## 2021-09-03 MED ORDER — GABAPENTIN 300 MG PO CAPS: 600.0000 mg | ORAL_CAPSULE | Freq: Every day | ORAL | Status: DC

## 2021-09-03 MED ORDER — SODIUM CHLORIDE (PF) 0.9 % IJ SOLN
INTRAMUSCULAR | Status: DC | PRN
  Administered 2021-09-03 (×2): 60 mL

## 2021-09-03 MED ORDER — CEFAZOLIN SODIUM-DEXTROSE 2-4 GM/100ML-% IV SOLN
INTRAVENOUS | Status: AC
Start: 2021-09-03 — End: 2021-09-03
  Filled 2021-09-03: qty 100

## 2021-09-03 MED ORDER — ACETAMINOPHEN 10 MG/ML IV SOLN
INTRAVENOUS | Status: AC
  Filled 2021-09-03: qty 100

## 2021-09-03 MED ORDER — VITAMIN D3 25 MCG (1000 UNIT) PO TABS
3000.0000 [IU] | ORAL_TABLET | Freq: Every day | ORAL | Status: DC
  Administered 2021-09-03: 3000 [IU] via ORAL
  Filled 2021-09-03 (×2): qty 3

## 2021-09-03 MED ORDER — LABETALOL HCL 5 MG/ML IV SOLN
INTRAVENOUS | Status: AC
  Administered 2021-09-03: 10 mg via INTRAVENOUS
  Filled 2021-09-03: qty 4

## 2021-09-03 MED ORDER — DEXAMETHASONE SODIUM PHOSPHATE 10 MG/ML IJ SOLN
INTRAMUSCULAR | Status: DC | PRN
  Administered 2021-09-03: 10 mg via INTRAVENOUS

## 2021-09-03 MED ORDER — OXYCODONE HCL 5 MG PO TABS
ORAL_TABLET | ORAL | Status: AC
Start: 2021-09-03 — End: 2021-09-03
  Administered 2021-09-03: 10 mg via ORAL
  Filled 2021-09-03: qty 2

## 2021-09-03 MED ORDER — LEVOTHYROXINE SODIUM 50 MCG PO TABS
50.0000 ug | ORAL_TABLET | Freq: Every day | ORAL | Status: DC
  Administered 2021-09-04: 50 ug via ORAL
  Filled 2021-09-03: qty 1

## 2021-09-03 MED ORDER — METHOCARBAMOL 500 MG PO TABS: 500.0000 mg | ORAL_TABLET | Freq: Four times a day (QID) | ORAL | Status: DC

## 2021-09-03 MED ORDER — SODIUM CHLORIDE 0.9 % IV SOLN
INTRAVENOUS | Status: DC | PRN
  Administered 2021-09-03: .1 ug/kg/min via INTRAVENOUS

## 2021-09-03 MED ORDER — PHENYLEPHRINE HCL-NACL 20-0.9 MG/250ML-% IV SOLN
INTRAVENOUS | Status: DC | PRN
  Administered 2021-09-03: 25 ug/min via INTRAVENOUS

## 2021-09-03 MED ORDER — APREPITANT 40 MG PO CAPS: 40.0000 mg | ORAL_CAPSULE | Freq: Once | ORAL | Status: AC

## 2021-09-03 MED ORDER — ONDANSETRON HCL 4 MG/2ML IJ SOLN
INTRAMUSCULAR | Status: AC
  Filled 2021-09-03: qty 2

## 2021-09-03 MED ORDER — SODIUM CHLORIDE 0.9% FLUSH: 3.0000 mL | INTRAVENOUS | Status: DC | PRN

## 2021-09-03 MED ORDER — LIDOCAINE HCL (PF) 2 % IJ SOLN
INTRAMUSCULAR | Status: AC
  Filled 2021-09-03: qty 5

## 2021-09-03 MED ORDER — PANTOPRAZOLE SODIUM 40 MG PO TBEC: 40.0000 mg | DELAYED_RELEASE_TABLET | Freq: Two times a day (BID) | ORAL | Status: DC

## 2021-09-03 MED ORDER — FENTANYL CITRATE (PF) 100 MCG/2ML IJ SOLN
INTRAMUSCULAR | Status: AC
  Filled 2021-09-03: qty 2

## 2021-09-03 MED ORDER — CHLORHEXIDINE GLUCONATE 0.12 % MT SOLN
15.0000 mL | Freq: Once | OROMUCOSAL | Status: AC
  Administered 2021-09-03: 15 mL via OROMUCOSAL

## 2021-09-03 MED ORDER — APREPITANT 40 MG PO CAPS
ORAL_CAPSULE | ORAL | Status: AC
  Administered 2021-09-03: 40 mg via ORAL
  Filled 2021-09-03: qty 1

## 2021-09-03 MED ORDER — FAMOTIDINE 20 MG PO TABS
ORAL_TABLET | ORAL | Status: AC
  Administered 2021-09-03: 20 mg
  Filled 2021-09-03: qty 1

## 2021-09-03 MED ORDER — OXYCODONE HCL 5 MG PO TABS: 10.0000 mg | ORAL_TABLET | ORAL | Status: DC | PRN

## 2021-09-03 MED ORDER — ACETAMINOPHEN 650 MG RE SUPP: 650.0000 mg | RECTAL | Status: DC | PRN

## 2021-09-03 MED ORDER — DEXAMETHASONE SODIUM PHOSPHATE 10 MG/ML IJ SOLN
INTRAMUSCULAR | Status: AC
  Filled 2021-09-03: qty 1

## 2021-09-03 MED ORDER — PROPOFOL 10 MG/ML IV BOLUS
INTRAVENOUS | Status: DC | PRN
  Administered 2021-09-03 (×3): 50 mg via INTRAVENOUS
  Administered 2021-09-03: 150 mg via INTRAVENOUS

## 2021-09-03 MED ORDER — BUPIVACAINE LIPOSOME 1.3 % IJ SUSP
INTRAMUSCULAR | Status: AC
  Filled 2021-09-03: qty 20

## 2021-09-03 MED ORDER — OXYCODONE HCL 5 MG PO TABS: 5.0000 mg | ORAL_TABLET | ORAL | Status: DC | PRN

## 2021-09-03 MED ORDER — MIDAZOLAM HCL 2 MG/2ML IJ SOLN
INTRAMUSCULAR | Status: DC | PRN
  Administered 2021-09-03: 2 mg via INTRAVENOUS

## 2021-09-03 MED ORDER — LABETALOL HCL 5 MG/ML IV SOLN
10.0000 mg | Freq: Once | INTRAVENOUS | Status: AC
  Administered 2021-09-03: 10 mg via INTRAVENOUS

## 2021-09-03 MED ORDER — CEFAZOLIN SODIUM-DEXTROSE 2-4 GM/100ML-% IV SOLN
2.0000 g | INTRAVENOUS | Status: AC
  Administered 2021-09-03: 2 g via INTRAVENOUS

## 2021-09-03 MED ORDER — BUPIVACAINE-EPINEPHRINE (PF) 0.5% -1:200000 IJ SOLN
INTRAMUSCULAR | Status: DC | PRN
  Administered 2021-09-03: 10 mL via PERINEURAL

## 2021-09-03 MED ORDER — ACETAMINOPHEN 500 MG PO TABS
1000.0000 mg | ORAL_TABLET | Freq: Four times a day (QID) | ORAL | Status: AC
  Administered 2021-09-03 – 2021-09-04 (×2): 1000 mg via ORAL

## 2021-09-03 MED ORDER — PROMETHAZINE HCL 25 MG/ML IJ SOLN
INTRAMUSCULAR | Status: AC
  Administered 2021-09-03: 12.5 mg via INTRAVENOUS
  Filled 2021-09-03: qty 1

## 2021-09-03 SURGICAL SUPPLY — 63 items
BUR NEURO DRILL SOFT 3.0X3.8M (BURR) ×2 IMPLANT
CHLORAPREP W/TINT 26 (MISCELLANEOUS) ×4 IMPLANT
CONTROL REMOTE FREELINK ALPHA (NEUROSURGERY SUPPLIES) ×1 IMPLANT
COUNTER NEEDLE 20/40 LG (NEEDLE) ×2 IMPLANT
CUP MEDICINE 2OZ PLAST GRAD ST (MISCELLANEOUS) ×2 IMPLANT
DERMABOND ADVANCED (GAUZE/BANDAGES/DRESSINGS) ×2
DERMABOND ADVANCED .7 DNX12 (GAUZE/BANDAGES/DRESSINGS) ×2 IMPLANT
DRAPE C ARM PK CFD 31 SPINE (DRAPES) ×2 IMPLANT
DRAPE LAPAROTOMY 100X77 ABD (DRAPES) ×2 IMPLANT
DRAPE SURG 17X11 SM STRL (DRAPES) ×2 IMPLANT
DRSG OPSITE POSTOP 4X6 (GAUZE/BANDAGES/DRESSINGS) ×2 IMPLANT
DRSG OPSITE POSTOP 4X8 (GAUZE/BANDAGES/DRESSINGS) ×4 IMPLANT
ELECT CAUTERY BLADE TIP 2.5 (TIP) ×2
ELECT REM PT RETURN 9FT ADLT (ELECTROSURGICAL) ×2
ELECTRODE CAUTERY BLDE TIP 2.5 (TIP) ×1 IMPLANT
ELECTRODE REM PT RTRN 9FT ADLT (ELECTROSURGICAL) ×1 IMPLANT
FEE INTRAOP CADWELL SUPPLY NCS (MISCELLANEOUS) IMPLANT
FEE INTRAOP MONITOR IMPULS NCS (MISCELLANEOUS) IMPLANT
GAUZE 4X4 16PLY ~~LOC~~+RFID DBL (SPONGE) ×2 IMPLANT
GLOVE BIOGEL PI IND STRL 6.5 (GLOVE) ×1 IMPLANT
GLOVE BIOGEL PI INDICATOR 6.5 (GLOVE) ×1
GLOVE SURG SYN 6.5 ES PF (GLOVE) ×2 IMPLANT
GLOVE SURG SYN 6.5 PF PI (GLOVE) ×1 IMPLANT
GLOVE SURG SYN 8.5  E (GLOVE) ×3
GLOVE SURG SYN 8.5 E (GLOVE) ×3 IMPLANT
GLOVE SURG SYN 8.5 PF PI (GLOVE) ×3 IMPLANT
GOWN SRG LRG LVL 4 IMPRV REINF (GOWNS) ×1 IMPLANT
GOWN SRG XL LVL 3 NONREINFORCE (GOWNS) ×1 IMPLANT
GOWN STRL NON-REIN TWL XL LVL3 (GOWNS) ×1
GOWN STRL REIN LRG LVL4 (GOWNS) ×1
GRADUATE 1200CC STRL 31836 (MISCELLANEOUS) ×2 IMPLANT
GRAFT DURAGEN MATRIX 1WX1L (Tissue) IMPLANT
INTRAOP CADWELL SUPPLY FEE NCS (MISCELLANEOUS) ×1
INTRAOP DISP SUPPLY FEE NCS (MISCELLANEOUS) ×1
INTRAOP MONITOR FEE IMPULS NCS (MISCELLANEOUS) ×1
INTRAOP MONITOR FEE IMPULSE (MISCELLANEOUS) ×1
KIT CHARGING (KITS) ×1
KIT CHARGING PRECISION NEURO (KITS) IMPLANT
KIT IPG ALPHA WAVEWRITER (Stimulator) ×1 IMPLANT
KIT SPINAL PRONEVIEW (KITS) ×2 IMPLANT
KIT TURNOVER KIT A (KITS) ×2 IMPLANT
LEAD ARTISAN 50CM (Spinal Cord Stimulator) ×1 IMPLANT
MANIFOLD NEPTUNE II (INSTRUMENTS) ×2 IMPLANT
MARKER SKIN DUAL TIP RULER LAB (MISCELLANEOUS) ×4 IMPLANT
NDL SAFETY ECLIPSE 18X1.5 (NEEDLE) ×1 IMPLANT
NEEDLE HYPO 18GX1.5 SHARP (NEEDLE) ×1
NEEDLE HYPO 22GX1.5 SAFETY (NEEDLE) ×2 IMPLANT
NS IRRIG 1000ML POUR BTL (IV SOLUTION) ×2 IMPLANT
PACK LAMINECTOMY NEURO (CUSTOM PROCEDURE TRAY) ×2 IMPLANT
SOLUTION IRRIG SURGIPHOR (IV SOLUTION) ×2 IMPLANT
STAPLER SKIN PROX 35W (STAPLE) ×2 IMPLANT
SURGIFLO W/THROMBIN 8M KIT (HEMOSTASIS) ×2 IMPLANT
SUT DVC VLOC 3-0 CL 6 P-12 (SUTURE) ×3 IMPLANT
SUT SILK 2 0SH CR/8 30 (SUTURE) ×2 IMPLANT
SUT VIC AB 0 CT1 18XCR BRD 8 (SUTURE) ×1 IMPLANT
SUT VIC AB 0 CT1 8-18 (SUTURE) ×3
SUT VIC AB 2-0 CT1 18 (SUTURE) ×4 IMPLANT
SYR 10ML LL (SYRINGE) ×2 IMPLANT
SYR 30ML LL (SYRINGE) ×4 IMPLANT
TOOL LONG TUNNEL (SPINAL CORD STIMULATOR) ×2 IMPLANT
TOOL TUNNELING 35CM (MISCELLANEOUS) IMPLANT
TOWEL OR 17X26 4PK STRL BLUE (TOWEL DISPOSABLE) ×6 IMPLANT
TUBING CONNECTING 10 (TUBING) ×2 IMPLANT

## 2021-09-03 NOTE — H&P (Signed)
I have reviewed and confirmed my history and physical from 08/07/21 with no additions or changes. Plan for SCS implantation.  Risks and benefits reviewed. ? ?Heart sounds normal no MRG. Chest Clear to Auscultation Bilaterally. ? ? ?  ? ?

## 2021-09-03 NOTE — Anesthesia Preprocedure Evaluation (Signed)
Anesthesia Evaluation  ?Patient identified by MRN, date of birth, ID band ?Patient awake ? ? ? ?Reviewed: ?Allergy & Precautions, NPO status , Patient's Chart, lab work & pertinent test results ? ?History of Anesthesia Complications ?(+) PONV, Family history of anesthesia reaction and history of anesthetic complications ? ?Airway ?Mallampati: II ? ?TM Distance: >3 FB ?Neck ROM: Full ? ? ? Dental ?no notable dental hx. ?(+) Teeth Intact, Dental Advidsory Given ?  ?Pulmonary ?neg shortness of breath, asthma , neg sleep apnea, neg COPD, neg recent URI, Patient abstained from smoking.Not current smoker,  ?  ?Pulmonary exam normal ?breath sounds clear to auscultation ? ? ? ? ? ? Cardiovascular ?Exercise Tolerance: Good ?METShypertension, (-) angina(-) CAD, (-) Past MI and (-) Cardiac Stents (-) dysrhythmias  ?Rhythm:Regular Rate:Normal ?- Systolic murmurs ? ?  ?Neuro/Psych ?neg Seizures  Neuromuscular disease negative psych ROS  ? GI/Hepatic ?GERD  Controlled and Medicated,(+)  ?  ? (-) substance abuse ? ,   ?Endo/Other  ?neg diabetesHypothyroidism  ? Renal/GU ?Renal disease  ? ?  ?Musculoskeletal ? ? Abdominal ?  ?Peds ? Hematology ?  ?Anesthesia Other Findings ?Past Medical History: ?No date: Anemia ?No date: Arthritis ?No date: Asthma ?2008: DVT (deep venous thrombosis) ([place]) ?    Comment:  left femeral s/p femeral osteotomy ?No date: Family history of adverse reaction to anesthesia ?    Comment:  mother had reaction but doesn't know what it is ?No date: GERD (gastroesophageal reflux disease) ?No date: Heart murmur ?No date: History of kidney stones ?No date: Hypertension ?No date: Hypothyroidism ?No date: Pneumonia ?No date: Polycystic ovary ?No date: PONV (postoperative nausea and vomiting) ?No date: Renal disorder ?No date: Thyroid disease ? Reproductive/Obstetrics ? ?  ? ? ? ? ? ? ? ? ? ? ? ? ? ?  ?  ? ? ? ? ? ? ? ? ?Anesthesia Physical ? ?Anesthesia Plan ? ?ASA: 2 ? ?Anesthesia  Plan: General  ? ?Post-op Pain Management:   ? ?Induction: Intravenous ? ?PONV Risk Score and Plan: 4 or greater and Ondansetron, Dexamethasone, Midazolam, TIVA, Propofol infusion and Aprepitant ? ?Airway Management Planned: Oral ETT ? ?Additional Equipment: None ? ?Intra-op Plan:  ? ?Post-operative Plan: Extubation in OR ? ?Informed Consent: I have reviewed the patients History and Physical, chart, labs and discussed the procedure including the risks, benefits and alternatives for the proposed anesthesia with the patient or authorized representative who has indicated his/her understanding and acceptance.  ? ? ? ?Dental advisory given ? ?Plan Discussed with: CRNA and Surgeon ? ?Anesthesia Plan Comments: (Discussed risks of anesthesia with patient, including PONV, sore throat, lip/dental damage. Rare risks discussed as well, such as cardiorespiratory and neurological sequelae, and allergic reactions. Patient understands.)  ? ? ? ? ? ? ?Anesthesia Quick Evaluation ? ?

## 2021-09-03 NOTE — Op Note (Signed)
Indications: the patient is a 61 yo female who was diagnosed with G89.4 chronic pain syndrome. The patient had a successful trial for spinal cord stimulation, so was consented for placement of a permanent device ?  ?Findings: successful placement of a [employer] spinal cord stimulator.   ?Preoperative Diagnosis: G89.4 chronic pain syndrome ?Postoperative Diagnosis: same ?  ?  ?EBL: 25 ml ?IVF: 1000 ml ?Drains: none ?Disposition: Extubated and Stable to PACU ?Complications: none ?  ?No foley catheter was placed. ?  ?  ?Preoperative Note:  ?  ?Risks of surgery discussed in clinic. ?  ?Operative Note:  ?  ?  ?The patient was then brought from the preoperative center with intravenous access established.  The patient underwent general anesthesia and endotracheal tube intubation, then was rotated on the [facility] table where all pressure points were appropriately padded.  An incision was marked with flouroscopy at T8/9, and on the left flank. The skin was then thoroughly cleansed.  Perioperative antibiotic prophylaxis was administered.  Sterile prep and drapes were then applied and a timeout was then observed.   ?  ?Once this was complete an incision was opened with the use of a #10 blade knife in the midline at the thoracic incision.  The paraspinus muscled were subperiosteally dissected until the laminae of T8 and T9 were visualized. Flouroscopy was used to confirm the level. A self-retaining retractor was placed. ?  ?The rongeur was used to remove the spinous process of T8.  The drill was used to thin the bone until the ligamentum flavum was visualized.  The ligamentum was then removed and the dura visualized. This was widened until placement of the paddle lead was possible.   ?  ?The lead was then advanced to the T6/7 disc space at the top of the lead.  The lead was secured with a 2-0 silk suture.  ? ?  ?The incision on the flank was then opened and a pocket formed until it was large enough for the pulse  generator.  The tunneler was used to connect between the pocket and the incision.  The lead was inserted into the tunneler and tunneled to the buttock.  The leads were attached to the IPG and impedances checked.  The leads were then tightened.  The IPG was then inserted into the pouch. ?  ?Both sites were irrigated.  The wounds were closed in layers with 0 and 2-0 vicryl.  The skin was approximated with monocryl. A sterile dressing was applied. ?  ?Monitoring was stable throughout. ?  ?Patient was then rotated back to the preoperative bed awakened from anesthesia and taken to recovery all counts are correct in this case. ?  ? ?  ?Venetia Night MD  ?

## 2021-09-03 NOTE — Anesthesia Postprocedure Evaluation (Signed)
Anesthesia Post Note ? ?Patient: Megan [name] ? ?Procedure(s) Performed: THORACIC LAMINECTOMY FOR SPINAL CORD STIMULATOR PLACEMENT ([employer]) ?PULSE GENERATOR IMPLANT ? ?Patient location during evaluation: PACU ?Anesthesia Type: General ?Level of consciousness: awake and alert ?Pain management: pain level controlled ?Vital Signs Assessment: post-procedure vital signs reviewed and stable ?Respiratory status: spontaneous breathing, nonlabored ventilation, respiratory function stable and patient connected to nasal cannula oxygen ?Cardiovascular status: blood pressure returned to baseline and stable ?Postop Assessment: no apparent nausea or vomiting ?Anesthetic complications: no ? ? ?No notable events documented. ? ? ?Last Vitals:  ?Vitals:  ? 09/03/21 1130 09/03/21 1149  ?BP: (!) 149/93 (!) 142/80  ?Pulse: (!) 57 60  ?Resp:  15  ?Temp: (!) 36.4 ?C (!) 36.1 ?C  ?SpO2: 100% 100%  ?  ?Last Pain:  ?Vitals:  ? 09/03/21 1149  ?TempSrc:   ?PainSc: Asleep  ? ? ?  ?  ?  ?  ?  ?  ? ?Corinda Gubler ? ? ? ? ?

## 2021-09-03 NOTE — Anesthesia Procedure Notes (Addendum)
Procedure Name: Intubation ?Date/Time: 09/03/2021 7:26 AM ?Performed by: Aline Brochure, CRNA ?Pre-anesthesia Checklist: Patient identified, Patient being monitored, Timeout performed, Emergency Drugs available and Suction available ?Patient Re-evaluated:Patient Re-evaluated prior to induction ?Oxygen Delivery Method: Circle system utilized ?Preoxygenation: Pre-oxygenation with 100% oxygen ?Induction Type: IV induction ?Ventilation: Mask ventilation without difficulty ?Laryngoscope Size: 3 and McGraph ?Grade View: Grade I ?Tube type: Oral ?Tube size: 6.5 mm ?Number of attempts: 1 ?Airway Equipment and Method: Stylet and Video-laryngoscopy ?Placement Confirmation: ETT inserted through vocal cords under direct vision, positive ETCO2 and breath sounds checked- equal and bilateral ?Secured at: 21 cm ?Tube secured with: Tape ?Dental Injury: Teeth and Oropharynx as per pre-operative assessment  ? ? ? ? ?

## 2021-09-03 NOTE — Progress Notes (Signed)
PHARMACY -  BRIEF ANTIBIOTIC NOTE  ? ?Pharmacy has received consult(s) for Cefazolin and Vancomycin from an OR provider.  The patient's profile has been reviewed for ht/wt/allergies/indication/available labs.   ? ?One time pre-op order(s) placed for Cefazolin 2 gm and Vancomycin 1 gm per pt wt 63.8 kg. ? ?Further antibiotics/pharmacy consults should be ordered by admitting physician if indicated.       ?                ?Thank you, ?Otelia Sergeant, PharmD, MBA ?09/03/2021 ?12:29 AM ? ? ?

## 2021-09-03 NOTE — Transfer of Care (Signed)
Immediate Anesthesia Transfer of Care Note ? ?Patient: Megan [name] ? ?Procedure(s) Performed: THORACIC LAMINECTOMY FOR SPINAL CORD STIMULATOR PLACEMENT ([employer]) ?PULSE GENERATOR IMPLANT ? ?Patient Location: PACU ? ?Anesthesia Type:General ? ?Level of Consciousness: drowsy ? ?Airway & Oxygen Therapy: Patient Spontanous Breathing and Patient connected to face mask oxygen ? ?Post-op Assessment: Report given to RN and Post -op Vital signs reviewed and stable ? ?Post vital signs: Reviewed and stable ? ?Last Vitals:  ?Vitals Value Taken Time  ?BP 135/82 09/03/21 0930  ?Temp 36.1 ?C 09/03/21 0926  ?Pulse 71 09/03/21 0932  ?Resp 12 09/03/21 0932  ?SpO2 100 % 09/03/21 0932  ?Vitals shown include unvalidated device data. ? ?Last Pain:  ?Vitals:  ? 09/03/21 0633  ?TempSrc: Temporal  ?PainSc: 8   ?   ? ?Patients Stated Pain Goal: 3 (09/03/21 1761) ? ?Complications: No notable events documented. ?

## 2021-09-04 DIAGNOSIS — G894 Chronic pain syndrome: Secondary | ICD-10-CM | POA: Diagnosis not present

## 2021-09-04 MED ORDER — DEXAMETHASONE SODIUM PHOSPHATE 10 MG/ML IJ SOLN: 4.0000 mg | Freq: Once | INTRAMUSCULAR | Status: AC

## 2021-09-04 MED ORDER — ACETAMINOPHEN 500 MG PO TABS
ORAL_TABLET | ORAL | Status: AC
  Administered 2021-09-04: 1000 mg via ORAL
  Filled 2021-09-04: qty 2

## 2021-09-04 MED ORDER — PANTOPRAZOLE SODIUM 40 MG PO TBEC
DELAYED_RELEASE_TABLET | ORAL | Status: AC
Start: 2021-09-04 — End: 2021-09-04
  Administered 2021-09-04: 40 mg via ORAL
  Filled 2021-09-04: qty 1

## 2021-09-04 MED ORDER — TRAMADOL HCL 50 MG PO TABS: 50.0000 mg | ORAL_TABLET | Freq: Four times a day (QID) | ORAL | 0 refills | Status: AC | PRN

## 2021-09-04 MED ORDER — ACETAMINOPHEN 500 MG PO TABS
ORAL_TABLET | ORAL | Status: AC
  Filled 2021-09-04: qty 2

## 2021-09-04 MED ORDER — DEXAMETHASONE SODIUM PHOSPHATE 10 MG/ML IJ SOLN
INTRAMUSCULAR | Status: AC
Start: 2021-09-04 — End: 2021-09-04
  Administered 2021-09-04: 4 mg via INTRAVENOUS
  Filled 2021-09-04: qty 1

## 2021-09-04 MED ORDER — OXYCODONE HCL 5 MG PO TABS
ORAL_TABLET | ORAL | Status: AC
  Filled 2021-09-04: qty 1

## 2021-09-04 MED ORDER — METHOCARBAMOL 500 MG PO TABS: 500.0000 mg | ORAL_TABLET | Freq: Four times a day (QID) | ORAL | 0 refills | Status: DC | PRN

## 2021-09-04 MED ORDER — SODIUM CHLORIDE FLUSH 0.9 % IV SOLN
INTRAVENOUS | Status: AC
Start: 2021-09-04 — End: 2021-09-04
  Administered 2021-09-04: 3 mL via INTRAVENOUS
  Filled 2021-09-04: qty 10

## 2021-09-04 MED ORDER — OXYCODONE HCL 5 MG PO TABS
ORAL_TABLET | ORAL | Status: AC
Start: 2021-09-04 — End: 2021-09-04
  Administered 2021-09-04: 10 mg via ORAL
  Filled 2021-09-04: qty 1

## 2021-09-04 MED ORDER — ACETAMINOPHEN 325 MG PO TABS: 650.0000 mg | ORAL_TABLET | ORAL | 0 refills | Status: DC | PRN

## 2021-09-04 MED ORDER — METHOCARBAMOL 500 MG PO TABS
ORAL_TABLET | ORAL | Status: DC
Start: 2021-09-04 — End: 2021-09-04
  Filled 2021-09-04: qty 1

## 2021-09-04 NOTE — Discharge Instructions (Signed)
Your surgeon has performed an operation on your spine (low back) for pain. Many times, patients feel better immediately after surgery and can "overdo it." Even if you feel well, it is important that you follow these activity guidelines. If you do not let your back heal properly from the surgery, you can increase the chance of a disc herniation and/or return of your symptoms. The following are instructions to help in your recovery once you have been discharged from the hospital.    Activity    No bending, lifting, or twisting ("BLT"). Avoid lifting objects heavier than 10 pounds (gallon milk jug).  Where possible, avoid household activities that involve lifting, bending, pushing, or pulling such as laundry, vacuuming, grocery shopping, and childcare. Try to arrange for help from friends and family for these activities while your back heals.  Increase physical activity slowly as tolerated.  Taking short walks is encouraged, but avoid strenuous exercise. Do not jog, run, bicycle, lift weights, or participate in any other exercises unless specifically allowed by your doctor. Avoid prolonged sitting, including car rides.  Talk to your doctor before resuming sexual activity.  You should not drive until cleared by your doctor.  Until released by your doctor, you should not return to work or school.  You should rest at home and let your body heal.   You may shower two days after your surgery.  After showering, lightly dab your incision dry. Do not take a tub bath or go swimming for 3 weeks, or until approved by your doctor at your follow-up appointment.  If you smoke, we strongly recommend that you quit.  Smoking has been proven to interfere with normal healing in your back and will dramatically reduce the success rate of your surgery. Please contact [facility] (800-QUIT-NOW) and use the resources at www.[facility].com for assistance in stopping smoking.  Surgical Incision   If you have a dressing on  your incision, you may remove it three days after your surgery. Keep your incision area clean and dry.  If you have staples or stitches on your incision, you should have a follow up scheduled for removal. If you do not have staples or stitches, you will have steri-strips (small pieces of surgical tape) or Dermabond glue. The steri-strips/glue should begin to peel away within about a week (it is fine if the steri-strips fall off before then). If the strips are still in place one week after your surgery, you may gently remove them.  Diet            You may return to your usual diet. Be sure to stay hydrated.  When to Contact us  Although your surgery and recovery will likely be uneventful, you may have some residual numbness, aches, and pains in your back and/or legs. This is normal and should improve in the next few weeks.  However, should you experience any of the following, contact us immediately: New numbness or weakness Pain that is progressively getting worse, and is not relieved by your pain medications or rest Bleeding, redness, swelling, pain, or drainage from surgical incision Chills or flu-like symptoms Fever greater than 101.0 F (38.3 C) Problems with bowel or bladder functions Difficulty breathing or shortness of breath Warmth, tenderness, or swelling in your calf  Contact Information During office hours (Monday-Friday 9 am to 5 pm), please call your physician at [phone] After hours and weekends, please call [phone] and speak with the answering service, who will contact the doctor on call.  If that  fails, call the [facility] Operator at [phone] and ask for the Neurosurgery Resident On Call  For a life-threatening emergency, call 911

## 2021-09-04 NOTE — Discharge Summary (Signed)
Physician Discharge Summary  Patient ID: Megan [name] MRN: [id number] DOB/AGE: Nov 19, 1960 61 y.o.  Admit date: 09/03/2021 Discharge date: 09/04/2021  Admission Diagnoses: chronic pain syndrome  Discharge Diagnoses:  Principal Problem:   Chronic pain syndrome   Discharged Condition: good  Hospital Course: Megan [name] was admitted for spinal cord stimulator placement.  She stayed overnight and was stable for discharge on POD1.  Consults: None  Significant Diagnostic Studies: radiology: X-Ray: correct placement of stimulator  Treatments: surgery: SCS implantation  Discharge Exam: Blood pressure 131/78, pulse 65, temperature 98.1 F (36.7 C), temperature source Temporal, resp. rate 18, height 5\' 3"  (1.6 m), weight 63.8 kg, SpO2 100 %. General appearance: alert and cooperative CNI 5/5 throughout  Disposition: Discharge disposition: 01-Home or Self Care       Discharge Instructions     Discharge patient   Complete by: As directed    Discharge disposition: 01-Home or Self Care   Discharge patient date: 09/04/2021   Incentive spirometry RT   Complete by: As directed       Allergies as of 09/04/2021       Reactions   Hydrocodone-acetaminophen Shortness Of Breath   Headache, Vomiting, nightmares. Pt is able to take acetaminophen.   Latex Rash, Other (See Comments)   Blisters    Morphine Anaphylaxis, Shortness Of Breath, Nausea And Vomiting, Swelling, Other (See Comments)   Has and facial swelling   Nickel Swelling, Rash   Petrolatum Rash, Other (See Comments)   Blister All petroleum products per patient, facial swelling   Tape Rash   Pt can use paper tape Blisters, tegaderm okay Prefers paper tape   Erythromycin Nausea And Vomiting   Hydromorphone Hcl Nausea And Vomiting   Bad headache   Nsaids    Avoid due to bariatric surgery   Other Other (See Comments)   Generic medications per patient- redness and burning in gums   Sulfa Antibiotics Rash            Medication List     TAKE these medications    acetaminophen 325 MG tablet Commonly known as: TYLENOL Take 2 tablets (650 mg total) by mouth every 4 (four) hours as needed for mild pain ((score 1 to 3) or temp > 100.5).   budesonide 0.5 MG/2ML nebulizer solution Commonly known as: PULMICORT Take 0.5 mg by nebulization daily as needed (shortness of breath).   carbamazepine 200 MG tablet Commonly known as: TEGRETOL Take 200 mg by mouth at bedtime. And PRN   chlorthalidone 25 MG tablet Commonly known as: HYGROTON Take 25 mg by mouth every morning.   estradiol 1 MG tablet Commonly known as: ESTRACE Take 1 mg by mouth daily.   fluticasone 50 MCG/ACT nasal spray Commonly known as: FLONASE Place 2 sprays into both nostrils daily.   Fluticasone-Salmeterol 250-50 MCG/DOSE Aepb Commonly known as: ADVAIR Inhale 1 puff into the lungs every morning.   gabapentin 300 MG capsule Commonly known as: NEURONTIN Take 600 mg by mouth at bedtime. And PRN   levalbuterol 45 MCG/ACT inhaler Commonly known as: XOPENEX HFA Inhale 2 puffs into the lungs every 4 (four) hours as needed for wheezing.   levothyroxine 50 MCG tablet Commonly known as: SYNTHROID Take 50 mcg by mouth daily before breakfast.   losartan 50 MG tablet Commonly known as: COZAAR Take 75 mg by mouth at bedtime.   methocarbamol 500 MG tablet Commonly known as: Robaxin Take 1 tablet (500 mg total) by mouth 4 (four) times daily. What changed: Another  medication with the same name was added. Make sure you understand how and when to take each.   methocarbamol 500 MG tablet Commonly known as: ROBAXIN Take 1 tablet (500 mg total) by mouth every 6 (six) hours as needed for muscle spasms. What changed: You were already taking a medication with the same name, and this prescription was added. Make sure you understand how and when to take each.   montelukast 10 MG tablet Commonly known as: SINGULAIR Take 10 mg by mouth at  bedtime.   MULTIVITAMIN ADULT PO Take 1 tablet by mouth daily.   pantoprazole 40 MG tablet Commonly known as: PROTONIX Take 40 mg by mouth 2 (two) times daily. And PRN for 3rd dose   traMADol 50 MG tablet Commonly known as: ULTRAM Take 1 tablet (50 mg total) by mouth every 6 (six) hours as needed for up to 7 days for moderate pain.   Vitamin D3 75 MCG (3000 UT) Tabs Take 3,000 Units by mouth daily.        Follow-up Information     Susanne Borders, PA Follow up in 2 week(s).   Contact information: [address] [place] [address] [phone]                 Signed: Venetia Night 09/04/2021, 8:13 AM

## 2021-09-04 NOTE — Progress Notes (Signed)
    Attending Progress Note  History: Tamina Cyphers is here for chronic pain.  She had a stimulator placed.  POD1: Doing much better but with a pain flare in R leg.  Physical Exam: Vitals:   09/04/21 0600 09/04/21 0754  BP: 95/60 131/78  Pulse: 66 65  Resp:  18  Temp:  98.1 F (36.7 C)  SpO2:  100%    AA Ox3 CNI  Strength:5/5 throughout BLE Incisions c.d.i  Data:  No results for input(s): NA, K, CL, CO2, BUN, CREATININE, LABGLOM, GLUCOSE, CALCIUM in the last 168 hours. No results for input(s): AST, ALT, ALKPHOS in the last 168 hours.  Invalid input(s): TBILI   No results for input(s): WBC, HGB, HCT, PLT in the last 168 hours. No results for input(s): APTT, INR in the last 168 hours.       Other tests/results: n/a  Assessment/Plan:  Zarahi Fuerst is doing well.  - mobilize - pain control - DVT prophylaxis   Venetia Night MD, [facility] Department of Neurosurgery

## 2021-11-06 ENCOUNTER — Encounter: Payer: Self-pay | Admitting: Urology

## 2021-11-06 NOTE — Telephone Encounter (Signed)
Patient called stating she is having severe abdominal pain, severe back pain, and severe bladder pain with dysuria. Patient asked for [place] to call in antibiotic as she is away at camp and unable to come to clinic or urgent care. I advised pt we are unable to treat patient with out evaluating her. I advised pt with the severity of her symptoms she needs to seek medication attention as soon as possible. Patient declined recommendation stating she was unable to leave camp and wanted an appointment for Monday. Pt scheduled.

## 2021-11-10 ENCOUNTER — Other Ambulatory Visit: Payer: Self-pay

## 2021-11-10 ENCOUNTER — Ambulatory Visit
Admission: RE | Admit: 2021-11-10 | Discharge: 2021-11-10 | Disposition: A | Discharge status: Home / Self Care | Payer: BC Managed Care – PPO | Source: Ambulatory Visit | Attending: Physician Assistant | Admitting: Physician Assistant

## 2021-11-10 ENCOUNTER — Other Ambulatory Visit: Payer: BC Managed Care – PPO

## 2021-11-10 ENCOUNTER — Ambulatory Visit (INDEPENDENT_AMBULATORY_CARE_PROVIDER_SITE_OTHER): Payer: BC Managed Care – PPO | Admitting: Physician Assistant

## 2021-11-10 VITALS — BP 175/106 | HR 76 | Temp 97.8°F | Ht 63.0 in | Wt 140.0 lb

## 2021-11-10 DIAGNOSIS — R109 Unspecified abdominal pain: Secondary | ICD-10-CM

## 2021-11-10 DIAGNOSIS — Z87442 Personal history of urinary calculi: Secondary | ICD-10-CM | POA: Diagnosis not present

## 2021-11-10 LAB — URINALYSIS, COMPLETE
Bilirubin, UA: NEGATIVE
Glucose, UA: NEGATIVE
Ketones, UA: NEGATIVE
Leukocytes,UA: NEGATIVE
Nitrite, UA: NEGATIVE
Protein,UA: NEGATIVE
RBC, UA: NEGATIVE
Specific Gravity, UA: 1.02 (ref 1.005–1.030)
Urobilinogen, Ur: 0.2 mg/dL (ref 0.2–1.0)
pH, UA: 5.5 (ref 5.0–7.5)

## 2021-11-10 LAB — MICROSCOPIC EXAMINATION
Bacteria, UA: NONE SEEN
RBC, Urine: NONE SEEN /hpf (ref 0–2)

## 2021-11-10 LAB — BLADDER SCAN AMB NON-IMAGING

## 2021-11-10 NOTE — Telephone Encounter (Signed)
Refill request from Denise at [facility].  

## 2021-11-10 NOTE — Progress Notes (Signed)
11/10/2021 11:47 AM   Megan [name] [birth year] [id number]  CC: Chief Complaint  Patient presents with   Abdominal Pain   Urinary Urgency   HPI: Megan [name] is a 61 y.o. female with PMH medullary sponge kidney, recurrent nephrolithiasis, and recurrent UTI who presents today for evaluation of possible UTI versus acute stone episode.   Today she reports a 1 week history of abdominal cramping, bladder discomfort, and severe urgency.  She describes her symptoms as "like a UTI minus with dysuria."  She never saw a stone pass.  Her symptoms have improved today and she is currently experiencing urgency, frequency, and bladder discomfort rated 8/10 in severity.  She describes diffuse back pain and is unable to specify laterality.    Overall, she states she has been passing a stone approximately every 1 to 2 months and is bothered by this frequency of stone passage.  Most recently, she passed a stone on the right side approximately 3 weeks ago, however she did not see that stone pass either.   Notably, she had a spinal cord stimulator placed 2 months ago.  She is unsure if her back pain is related to a possible stone episode or her underlying chronic back pain.  She has a history of radiolucent stones.  In-office UA today pan negative; urine microscopy with calcium oxalate crystals.  PVR 62mL.  PMH: Past Medical History:  Diagnosis Date   Anemia    Arthritis    Asthma    DDD (degenerative disc disease), lumbosacral    DVT (deep venous thrombosis) (HCC) 2008   left femeral s/p femeral osteotomy   Facet syndrome, lumbar    Family history of adverse reaction to anesthesia    mother had reaction but doesn't know what it is   GERD (gastroesophageal reflux disease)    Heart murmur    History of kidney stones    Hypertension    Hypothyroidism    Lumbar foraminal stenosis    Medullary sponge kidney    Pneumonia    Polycystic ovary    PONV (postoperative nausea and vomiting)     Pre-diabetes    Renal disorder    Thyroid disease    Trigeminal neuralgia     Surgical History: Past Surgical History:  Procedure Laterality Date   ABDOMINAL HYSTERECTOMY     APPENDECTOMY  1980   CYSTOSCOPY/URETEROSCOPY/HOLMIUM LASER/STENT PLACEMENT Right 06/29/2019   Procedure: CYSTOSCOPY/URETEROSCOPY/HOLMIUM LASER/STENT PLACEMENT;  Surgeon: Vanna Scotland, MD;  Location: [facility];  Service: Urology;  Laterality: Right;   CYSTOSCOPY/URETEROSCOPY/HOLMIUM LASER/STENT PLACEMENT  07/15/2020   ESOPHAGOGASTRODUODENOSCOPY     2021, 08/16/2020, 11/13/2020   FEMORAL OSTEOTOMY W/ APPLICATION EXTERNAL FIXATOR Right    KNEE ARTHROSCOPY W/ ACL RECONSTRUCTION Left    KNEE ARTHROSCOPY W/ HARDWARE REMOVAL Right 01/13/2017   LAPAROSCOPIC GASTRIC BYPASS  02/12/2020   LITHOTRIPSY     LUMBAR LAMINECTOMY/DECOMPRESSION MICRODISCECTOMY Right 01/22/2021   Procedure: L4-5 DECOMPRESSION;  Surgeon: Venetia Night, MD;  Location: [facility];  Service: Neurosurgery;  Laterality: Right;   OSTEOTOMY Bilateral 2017   femoral shaft/supracondylar   PULSE GENERATOR IMPLANT N/A 09/03/2021   Procedure: PULSE GENERATOR IMPLANT;  Surgeon: Venetia Night, MD;  Location: [facility];  Service: Neurosurgery;  Laterality: N/A;   ROTATOR CUFF REPAIR Right    THORACIC LAMINECTOMY FOR SPINAL CORD STIMULATOR N/A 09/03/2021   Procedure: THORACIC LAMINECTOMY FOR SPINAL CORD STIMULATOR PLACEMENT ([employer]);  Surgeon: Venetia Night, MD;  Location: [facility];  Service: Neurosurgery;  Laterality: N/A;   TONSILLECTOMY  1964   TOTAL KNEE ARTHROPLASTY Right 11/11/2018    Home Medications:  Allergies as of 11/10/2021       Reactions   Hydrocodone-acetaminophen Shortness Of Breath   Headache, Vomiting, nightmares. Pt is able to take acetaminophen.   Latex Rash, Other (See Comments)   Blisters    Morphine Anaphylaxis, Shortness Of Breath, Nausea And Vomiting, Swelling, Other (See Comments)   Has and facial swelling    Nickel Swelling, Rash   Petrolatum Rash, Other (See Comments)   Blister All petroleum products per patient, facial swelling   Tape Rash   Pt can use paper tape Blisters, tegaderm okay Prefers paper tape   Erythromycin Nausea And Vomiting   Hydromorphone Hcl Nausea And Vomiting   Bad headache   Nsaids    Avoid due to bariatric surgery   Other Other (See Comments)   Generic medications per patient- redness and burning in gums   Sulfa Antibiotics Rash           Medication List        Accurate as of November 10, 2021 11:47 AM. If you have any questions, ask your nurse or doctor.          acetaminophen 325 MG tablet Commonly known as: TYLENOL Take 2 tablets (650 mg total) by mouth every 4 (four) hours as needed for mild pain ((score 1 to 3) or temp > 100.5).   budesonide 0.5 MG/2ML nebulizer solution Commonly known as: PULMICORT Take 0.5 mg by nebulization daily as needed (shortness of breath).   carbamazepine 200 MG tablet Commonly known as: TEGRETOL Take 200 mg by mouth at bedtime. And PRN   chlorthalidone 25 MG tablet Commonly known as: HYGROTON Take 25 mg by mouth every morning.   estradiol 1 MG tablet Commonly known as: ESTRACE Take 1 mg by mouth daily.   fluticasone 50 MCG/ACT nasal spray Commonly known as: FLONASE Place 2 sprays into both nostrils daily.   Fluticasone-Salmeterol 250-50 MCG/DOSE Aepb Commonly known as: ADVAIR Inhale 1 puff into the lungs every morning.   gabapentin 300 MG capsule Commonly known as: NEURONTIN Take 600 mg by mouth at bedtime. And PRN   levalbuterol 45 MCG/ACT inhaler Commonly known as: XOPENEX HFA Inhale 2 puffs into the lungs every 4 (four) hours as needed for wheezing.   levothyroxine 50 MCG tablet Commonly known as: SYNTHROID Take 50 mcg by mouth daily before breakfast.   losartan 50 MG tablet Commonly known as: COZAAR Take 75 mg by mouth at bedtime.   methocarbamol 500 MG tablet Commonly known as:  Robaxin Take 1 tablet (500 mg total) by mouth 4 (four) times daily.   methocarbamol 500 MG tablet Commonly known as: ROBAXIN Take 1 tablet (500 mg total) by mouth every 6 (six) hours as needed for muscle spasms.   montelukast 10 MG tablet Commonly known as: SINGULAIR Take 10 mg by mouth at bedtime.   MULTIVITAMIN ADULT PO Take 1 tablet by mouth daily.   pantoprazole 40 MG tablet Commonly known as: PROTONIX Take 40 mg by mouth 2 (two) times daily. And PRN for 3rd dose   Vitamin D3 75 MCG (3000 UT) Tabs Take 3,000 Units by mouth daily.        Allergies:  Allergies  Allergen Reactions   Hydrocodone-Acetaminophen Shortness Of Breath    Headache, Vomiting, nightmares. Pt is able to take acetaminophen.     Latex Rash and Other (See Comments)    Blisters      Morphine Anaphylaxis,  Shortness Of Breath, Nausea And Vomiting, Swelling and Other (See Comments)    Has and facial swelling    Nickel Swelling and Rash   Petrolatum Rash and Other (See Comments)    Blister All petroleum products per patient, facial swelling    Tape Rash    Pt can use paper tape Blisters, tegaderm okay Prefers paper tape   Erythromycin Nausea And Vomiting   Hydromorphone Hcl Nausea And Vomiting    Bad headache   Nsaids     Avoid due to bariatric surgery   Other Other (See Comments)    Generic medications per patient- redness and burning in gums    Sulfa Antibiotics Rash         Family History: No family history on file.  Social History:   reports that she has never smoked. She has never used smokeless tobacco. She reports that she does not currently use alcohol. She reports that she does not use drugs.  Physical Exam: BP (!) 175/106   Pulse 76   Temp 97.8 F (36.6 C) (Oral)   Ht 5\' 3"  (1.6 m)   Wt 140 lb (63.5 kg)   BMI 24.80 kg/m   Constitutional:  Alert and oriented, no acute distress, nontoxic appearing HEENT: [place], AT Cardiovascular: No clubbing, cyanosis, or  edema Respiratory: Normal respiratory effort, no increased work of breathing Skin: No rashes, bruises or suspicious lesions Neurologic: Grossly intact, no focal deficits, moving all 4 extremities Psychiatric: Normal mood and affect  Laboratory Data: See Epic Results for orders placed or performed in visit on 11/10/21  Bladder Scan (Post Void Residual) in office  Result Value Ref Range   Scan Result 8mL    Assessment & Plan:   1. Flank pain with history of urolithiasis UA is benign today, VSS and PVR WNL.  Low suspicion for UTI at this time, I think her symptoms are more likely consistent with an acute stone episode.  With her history of radiolucent stones, I recommend proceeding with CT stone study at this time.  She is in agreement with this plan. - Urinalysis, Complete - Bladder Scan (Post Void Residual) in office - CT RENAL STONE STUDY; Future   Return for Will call with results.  93m, PA-C  [facility] [address], [address] [place], [place] [phone]

## 2021-11-13 ENCOUNTER — Other Ambulatory Visit: Payer: Self-pay | Admitting: Physician Assistant

## 2021-11-13 ENCOUNTER — Telehealth: Payer: Self-pay

## 2021-11-13 DIAGNOSIS — R109 Unspecified abdominal pain: Secondary | ICD-10-CM

## 2021-11-13 NOTE — Telephone Encounter (Signed)
Spoke with [employer] about this patient regarding possible Lithotripsy with a Spinal Cord Stimulator in place. I was informed as long as the treatment area is 6 inches or greater away from stimulator it can be done. If less than 6 inches it can not be performed.

## 2021-11-18 ENCOUNTER — Encounter: Payer: Self-pay | Admitting: Urology

## 2021-11-19 NOTE — Telephone Encounter (Signed)
Called patient to discuss symptoms. Denies any worsening pain. Advised to increase fluids to stay hydrated. Voiced understanding.

## 2021-11-20 ENCOUNTER — Telehealth: Payer: Self-pay

## 2021-11-20 ENCOUNTER — Other Ambulatory Visit: Payer: Self-pay | Admitting: Urology

## 2021-11-20 ENCOUNTER — Telehealth (INDEPENDENT_AMBULATORY_CARE_PROVIDER_SITE_OTHER): Payer: BC Managed Care – PPO | Admitting: Urology

## 2021-11-20 DIAGNOSIS — N200 Calculus of kidney: Secondary | ICD-10-CM

## 2021-11-20 NOTE — Progress Notes (Signed)
Virtual Visit via Video Note  I connected with Megan [name] on  11/20/2021 at  9:00 AM EDT by a video enabled telemedicine application and verified that I am speaking with the correct person using two identifiers.  Location: Patient: Home Provider: [place]    I discussed the limitations of evaluation and management by telemedicine and the availability of in person appointments. The patient expressed understanding and agreed to proceed.  History of Present Illness: Megan [name] is a 61 y.o. female  with a personal history of medullary sponge kidney, recurrent nephrolithiasis, and recurrent UTIs, who presents today via video visit to discuss stone treatment options.   Per CT stone study dated 06/26/2019, she was noted to have multiple bilateral nonobstructing kidney stones, largest on the right measuring 5 mm.  She subsequently underwent right URS/LL/stent placement to treat a distal right ureteral stone and multiple right renal stones.  Follow-up renal ultrasound dated 08/02/2019 with no right-sided stones visualized.   She underwent a CT renal stone study for right flank pain that revealed a 3 mm calculus at the right UVJ with no hydronephrosis. As well as multiple additional small bilateral nonobstructive renal calculi.   She underwent a CT renal stone study on 11/10/2021 for further evaluation of flank pain, severe abdominal pain,back pain, and bladder pain with dysuria. It visualized nonobstructive bilateral nephrolithiasis measuring up to 0.9 cm on the right and 0.6 cm on the left.  She has a spinal cord stimulator.  Spinal stimulator is within proximity to the right-sided stone that ESWL is contraindicated.  She recently tested positive for Covid-19.   She has intermittent right flank pain.  She feels like the stone may be popping in and out of her ureter.  Is been going on for several months.    Observations/Objective: She is alert and answering all questions.   Assessment and  Plan: Bilateral nephrolithiasis  We discussed various treatment options for urolithiasis including observation with or without medical expulsive therapy, shockwave lithotripsy (SWL), ureteroscopy and laser lithotripsy with stent placement, and percutaneous nephrolithotomy.   We discussed that management is based on stone size, location, density, patient co-morbidities, and patient preference.    Stones <23mm in size have a >80% spontaneous passage rate. Data surrounding the use of tamsulosin for medical expulsive therapy is controversial, but meta analyses suggests it is most efficacious for distal stones between 5-11mm in size. Possible side effects include dizziness/lightheadedness, and retrograde ejaculation.   She is not a candidate for ESWL in light of presence of spinal cord stimulator    Ureteroscopy with laser lithotripsy and stent placement has a higher stone free rate than SWL in a single procedure, however increased complication rate including possible infection, ureteral injury, bleeding, and stent related morbidity. Common stent related symptoms include dysuria, urgency/frequency, and flank pain.   After an extensive discussion of the risks and benefits of the above treatment options, the patient would like to proceed with Bilateral ureteroscopy.   I also personally reviewed her CT scan today.  I was also able to screen share it with her.  With radiologic interpretation.   Follow Up Instructions: Schedule surgery   I discussed the assessment and treatment plan with the patient. The patient was provided an opportunity to ask questions and all were answered. The patient agreed with the plan and demonstrated an understanding of the instructions.   The patient was advised to call back or seek an in-person evaluation if the symptoms worsen or if the condition fails to  improve as anticipated.  I provided 15 minutes of non-face-to-face time during this encounter.   I,Megan  [name],acting as a scribe for Megan Pospisil, MD.,have documented all relevant documentation on the behalf of Megan Lekas, MD,as directed by  Megan Kneisley, MD while in the presence of Megan Topor, MD  I have reviewed the above documentation for accuracy and completeness, and I agree with the above.   Megan Bocchino, MD  

## 2021-11-20 NOTE — Progress Notes (Signed)
[facility] Posting Form   Surgery Date/Time: Date: 12/01/2021  Surgeon: Dr. Vanna Scotland, MD  Surgery Location: Day Surgery  Inpt ( No  )   Outpt (Yes)   Obs ( No  )   Diagnosis: N20.0 Bilateral Nephrolithiasis  -CPT: 52356  Surgery: Bilateral Ureteroscopy with laser lithotripsy and stent placement  Stop Anticoagulations: No, may continue ASA  Cardiac/Medical/Pulmonary Clearance needed: no  *Orders entered into EPIC  Date: 11/20/21   *Case booked in [place]  Date: 11/20/21  *Notified pt of Surgery: Date: 11/20/21  PRE-OP UA & CX: yes, will obtain in clinic on 11/25/2021  *Placed into Prior Authorization Work Angela Nevin Date: 11/20/21   Assistant/laser/rep:No

## 2021-11-20 NOTE — Progress Notes (Signed)
Surgical Physician Order Form [facility] [place]  * Scheduling expectation : Next Available (? Thursday if possible, maybe on litho day per patient request)  *Length of Case:   *Clearance needed: no  *Anticoagulation Instructions: N/A  *Aspirin Instructions: Ok to continue Aspirin  *Post-op visit Date/Instructions:  1 week cysto stent removal  *Diagnosis:  Bilateral nephrolithiasis  *Procedure: bilateral Ureteroscopy w/laser lithotripsy & stent placement (50569)   Additional orders: N/A  -Admit type: OUTpatient  -Anesthesia: General  -VTE Prophylaxis Standing Order SCD's       Other:   -Standing Lab Orders Per Anesthesia    Lab other: UA&Urine Culture  -Standing Test orders EKG/Chest x-ray per Anesthesia       Test other:   - Medications:  Ancef 2gm IV  -Other orders:  N/A

## 2021-11-20 NOTE — H&P (View-Only) (Signed)
Virtual Visit via Video Note  I connected with Megan [name] on  11/20/2021 at  9:00 AM EDT by a video enabled telemedicine application and verified that I am speaking with the correct person using two identifiers.  Location: Patient: Home Provider: [place]    I discussed the limitations of evaluation and management by telemedicine and the availability of in person appointments. The patient expressed understanding and agreed to proceed.  History of Present Illness: Megan [name] is a 61 y.o. female  with a personal history of medullary sponge kidney, recurrent nephrolithiasis, and recurrent UTIs, who presents today via video visit to discuss stone treatment options.   Per CT stone study dated 06/26/2019, she was noted to have multiple bilateral nonobstructing kidney stones, largest on the right measuring 5 mm.  She subsequently underwent right URS/LL/stent placement to treat a distal right ureteral stone and multiple right renal stones.  Follow-up renal ultrasound dated 08/02/2019 with no right-sided stones visualized.   She underwent a CT renal stone study for right flank pain that revealed a 3 mm calculus at the right UVJ with no hydronephrosis. As well as multiple additional small bilateral nonobstructive renal calculi.   She underwent a CT renal stone study on 11/10/2021 for further evaluation of flank pain, severe abdominal pain,back pain, and bladder pain with dysuria. It visualized nonobstructive bilateral nephrolithiasis measuring up to 0.9 cm on the right and 0.6 cm on the left.  She has a spinal cord stimulator.  Spinal stimulator is within proximity to the right-sided stone that ESWL is contraindicated.  She recently tested positive for Covid-19.   She has intermittent right flank pain.  She feels like the stone may be popping in and out of her ureter.  Is been going on for several months.    Observations/Objective: She is alert and answering all questions.   Assessment and  Plan: Bilateral nephrolithiasis  We discussed various treatment options for urolithiasis including observation with or without medical expulsive therapy, shockwave lithotripsy (SWL), ureteroscopy and laser lithotripsy with stent placement, and percutaneous nephrolithotomy.   We discussed that management is based on stone size, location, density, patient co-morbidities, and patient preference.    Stones <23mm in size have a >80% spontaneous passage rate. Data surrounding the use of tamsulosin for medical expulsive therapy is controversial, but meta analyses suggests it is most efficacious for distal stones between 5-11mm in size. Possible side effects include dizziness/lightheadedness, and retrograde ejaculation.   She is not a candidate for ESWL in light of presence of spinal cord stimulator    Ureteroscopy with laser lithotripsy and stent placement has a higher stone free rate than SWL in a single procedure, however increased complication rate including possible infection, ureteral injury, bleeding, and stent related morbidity. Common stent related symptoms include dysuria, urgency/frequency, and flank pain.   After an extensive discussion of the risks and benefits of the above treatment options, the patient would like to proceed with Bilateral ureteroscopy.   I also personally reviewed her CT scan today.  I was also able to screen share it with her.  With radiologic interpretation.   Follow Up Instructions: Schedule surgery   I discussed the assessment and treatment plan with the patient. The patient was provided an opportunity to ask questions and all were answered. The patient agreed with the plan and demonstrated an understanding of the instructions.   The patient was advised to call back or seek an in-person evaluation if the symptoms worsen or if the condition fails to  improve as anticipated.  I provided 15 minutes of non-face-to-face time during this encounter.   I,Kailey  Littlejohn,acting as a Neurosurgeon for Vanna Scotland, MD.,have documented all relevant documentation on the behalf of Vanna Scotland, MD,as directed by  Vanna Scotland, MD while in the presence of Vanna Scotland, MD  I have reviewed the above documentation for accuracy and completeness, and I agree with the above.   Vanna Scotland, MD

## 2021-11-20 NOTE — Telephone Encounter (Signed)
I spoke with Megan [name]. We have discussed possible surgery dates and Monday August 14th, 2023 was agreed upon by all parties. Patient given information about surgery date, what to expect pre-operatively and post operatively.  We discussed that a Pre-Admission Testing office will be calling to set up the pre-op visit that will take place prior to surgery, and that these appointments are typically done over the phone with a Pre-Admissions RN.  Informed patient that our office will communicate any additional care to be provided after surgery. Patients questions or concerns were discussed during our call. Advised to call our office should there be any additional information, questions or concerns that arise. Patient verbalized understanding.

## 2021-11-25 ENCOUNTER — Telehealth: Payer: Self-pay | Admitting: Neurosurgery

## 2021-11-25 ENCOUNTER — Other Ambulatory Visit: Payer: BC Managed Care – PPO

## 2021-11-25 DIAGNOSIS — N200 Calculus of kidney: Secondary | ICD-10-CM

## 2021-11-25 LAB — MICROSCOPIC EXAMINATION: RBC, Urine: >30 /hpf — AB (ref 0–2)

## 2021-11-25 LAB — URINALYSIS, COMPLETE
Bilirubin, UA: NEGATIVE
Glucose, UA: NEGATIVE
Ketones, UA: NEGATIVE
Leukocytes,UA: NEGATIVE
Nitrite, UA: NEGATIVE
Specific Gravity, UA: 1.025 (ref 1.005–1.030)
Urobilinogen, Ur: 0.2 mg/dL (ref 0.2–1.0)
pH, UA: 5.5 (ref 5.0–7.5)

## 2021-11-25 NOTE — Telephone Encounter (Signed)
I got an e-mail for refill request via e-mail in [facility] for Tizanidine. Unfortunately we are no longer following her. She will need to request this from her PCP. Will you please let her know? Also please inform her that we no longer have access to [facility] and any further communication to our office needs to come through [facility]? Thank you

## 2021-11-25 NOTE — Telephone Encounter (Signed)
Left message to call back  

## 2021-11-26 NOTE — Telephone Encounter (Signed)
Patient has requested to send Dr.Yarbrough a mychart message to explain in detail her symptoms. She is no longer getting any relief from the SCS, she has burning and aching down her leg and shin. She has developed a sore over the SCS. I offered to take a message but she insisted sending a mychart message directly to him.

## 2021-11-27 ENCOUNTER — Encounter
Admission: RE | Admit: 2021-11-27 | Discharge: 2021-11-27 | Disposition: A | Discharge status: Home / Self Care | Payer: BC Managed Care – PPO | Source: Ambulatory Visit | Attending: Urology | Admitting: Urology

## 2021-11-27 ENCOUNTER — Other Ambulatory Visit: Payer: Self-pay

## 2021-11-27 HISTORY — DX: Dyspnea, unspecified: R06.00

## 2021-11-27 NOTE — Patient Instructions (Addendum)
Your procedure is scheduled on: 12/01/21 - Monday Report to the Registration Desk on the 1st floor of the [facility]. To find out your arrival time, please call [phone] between 1PM - 3PM on: 11/28/21  If your arrival time is 6:00 am, do not arrive prior to that time as the Medical Mall entrance doors do not open until 6:00 am.  REMEMBER: Instructions that are not followed completely may result in serious medical risk, up to and including death; or upon the discretion of your surgeon and anesthesiologist your surgery may need to be rescheduled.  Do not eat food or drink any fluids after midnight the night before surgery.  No gum chewing, lozengers or hard candies.  TAKE THESE MEDICATIONS THE MORNING OF SURGERY WITH A SIP OF WATER:  - fluticasone (FLONASE) - Fluticasone-Salmeterol (ADVAIR)  - levothyroxine (SYNTHROID)  - misoprostol (CYTOTEC) - pantoprazole (PROTONIX)   Use inhalers  levalbuterol (XOPENEX HFA) 45 MCG/ACT  on the day of surgery and bring to the hospital.  You will need to bring your deactivation device for your Spinal Cord Stimulator on the day of surgery.  One week prior to surgery: Stop Anti-inflammatories (NSAIDS) such as Advil, Aleve, Ibuprofen, Motrin, Naproxen, Naprosyn and Aspirin based products such as Excedrin, Goodys Powder, BC Powder.  Stop ANY OVER THE COUNTER supplements until after surgery.  You may however, continue to take Tylenol if needed for pain up until the day of surgery.  No Alcohol for 24 hours before or after surgery.  No Smoking including e-cigarettes for 24 hours prior to surgery.  No chewable tobacco products for at least 6 hours prior to surgery.  No nicotine patches on the day of surgery.  Do not use any "recreational" drugs for at least a week prior to your surgery.  Please be advised that the combination of cocaine and anesthesia may have negative outcomes, up to and including death. If you test positive for cocaine, your  surgery will be cancelled.  On the morning of surgery brush your teeth with toothpaste and water, you may rinse your mouth with mouthwash if you wish. Do not swallow any toothpaste or mouthwash.  Do not wear jewelry, make-up, hairpins, clips or nail polish.  Do not wear lotions, powders, or perfumes.   Do not shave body from the neck down 48 hours prior to surgery just in case you cut yourself which could leave a site for infection.  Also, freshly shaved skin may become irritated if using the CHG soap.  Contact lenses, hearing aids and dentures may not be worn into surgery.  Do not bring valuables to the hospital. [facility] is not responsible for any missing/lost belongings or valuables.   Notify your doctor if there is any change in your medical condition (cold, fever, infection).  Wear comfortable clothing (specific to your surgery type) to the hospital.  After surgery, you can help prevent lung complications by doing breathing exercises.  Take deep breaths and cough every 1-2 hours. Your doctor may order a device called an Incentive Spirometer to help you take deep breaths. When coughing or sneezing, hold a pillow firmly against your incision with both hands. This is called "splinting." Doing this helps protect your incision. It also decreases belly discomfort.  If you are being admitted to the hospital overnight, leave your suitcase in the car. After surgery it may be brought to your room.  If you are being discharged the day of surgery, you will not be allowed to drive  home. You will need a responsible adult (18 years or older) to drive you home and stay with you that night.   If you are taking public transportation, you will need to have a responsible adult (18 years or older) with you. Please confirm with your physician that it is acceptable to use public transportation.   Please call the [place] Dept. at [phone] if you have any questions about these  instructions.  Surgery Visitation Policy:  Patients undergoing a surgery or procedure may have two family members or support persons with them as long as the person is not COVID-19 positive or experiencing its symptoms.   Inpatient Visitation:    Visiting hours are 7 a.m. to 8 p.m. Up to four visitors are allowed at one time in a patient room, including children. The visitors may rotate out with other people during the day. One designated support person (adult) may remain overnight.

## 2021-11-27 NOTE — Telephone Encounter (Signed)
Left message requesting she send a mychart message with a picture versus come in at 1:45pm tomorrow.

## 2021-11-28 ENCOUNTER — Other Ambulatory Visit: Payer: Self-pay | Admitting: Neurosurgery

## 2021-11-28 ENCOUNTER — Telehealth: Payer: Self-pay

## 2021-11-28 LAB — CULTURE, URINE COMPREHENSIVE

## 2021-11-28 MED ORDER — NITROFURANTOIN MONOHYD MACRO 100 MG PO CAPS: 100.0000 mg | ORAL_CAPSULE | Freq: Two times a day (BID) | ORAL | 0 refills | Status: DC

## 2021-11-28 MED ORDER — TIZANIDINE HCL 2 MG PO TABS: 2.0000 mg | ORAL_TABLET | Freq: Three times a day (TID) | ORAL | 2 refills | Status: DC | PRN

## 2021-11-28 NOTE — Telephone Encounter (Signed)
Spoke with pt. Pt. Advised and I have sent in medication to [facility] in [place] per her request.

## 2021-11-28 NOTE — Telephone Encounter (Signed)
-----   Message from Vanna Scotland, MD sent at 11/28/2021  1:48 PM EDT ----- Very low colony count of bacteria but given that she is going to have a procedure, think to go ahead and treat this.  Macrobid twice daily for 10 days.  Vanna Scotland, MD

## 2021-12-01 ENCOUNTER — Encounter
Admission: RE | Disposition: A | Discharge status: Home / Self Care | Payer: Self-pay | Source: Home / Self Care | Attending: Urology

## 2021-12-01 ENCOUNTER — Ambulatory Visit: Payer: BC Managed Care – PPO | Admitting: Certified Registered"

## 2021-12-01 ENCOUNTER — Ambulatory Visit
Admission: RE | Admit: 2021-12-01 | Discharge: 2021-12-01 | Disposition: A | Discharge status: Home / Self Care | Payer: BC Managed Care – PPO | Attending: Urology | Admitting: Urology

## 2021-12-01 ENCOUNTER — Ambulatory Visit: Payer: BC Managed Care – PPO

## 2021-12-01 ENCOUNTER — Other Ambulatory Visit: Payer: Self-pay

## 2021-12-01 ENCOUNTER — Encounter: Payer: Self-pay | Admitting: Urology

## 2021-12-01 DIAGNOSIS — N200 Calculus of kidney: Secondary | ICD-10-CM | POA: Diagnosis present

## 2021-12-01 DIAGNOSIS — Z87440 Personal history of urinary (tract) infections: Secondary | ICD-10-CM | POA: Insufficient documentation

## 2021-12-01 HISTORY — PX: CYSTOSCOPY/URETEROSCOPY/HOLMIUM LASER/STENT PLACEMENT: SHX6546

## 2021-12-01 SURGERY — CYSTOSCOPY/URETEROSCOPY/HOLMIUM LASER/STENT PLACEMENT
Anesthesia: General | Site: Ureter | Laterality: Bilateral

## 2021-12-01 MED ORDER — LACTATED RINGERS IV SOLN: INTRAVENOUS | Status: DC

## 2021-12-01 MED ORDER — ACETAMINOPHEN 10 MG/ML IV SOLN
INTRAVENOUS | Status: AC
Start: 2021-12-01 — End: ?
  Filled 2021-12-01: qty 100

## 2021-12-01 MED ORDER — OXYCODONE HCL 5 MG PO TABS
5.0000 mg | ORAL_TABLET | Freq: Once | ORAL | Status: AC | PRN
  Administered 2021-12-01: 5 mg via ORAL

## 2021-12-01 MED ORDER — FENTANYL CITRATE (PF) 100 MCG/2ML IJ SOLN
INTRAMUSCULAR | Status: AC
  Administered 2021-12-01: 25 ug via INTRAVENOUS
  Filled 2021-12-01: qty 2

## 2021-12-01 MED ORDER — TAMSULOSIN HCL 0.4 MG PO CAPS: 0.4000 mg | ORAL_CAPSULE | Freq: Every day | ORAL | 0 refills | Status: DC

## 2021-12-01 MED ORDER — PROPOFOL 10 MG/ML IV BOLUS
INTRAVENOUS | Status: DC | PRN
  Administered 2021-12-01: 150 mg via INTRAVENOUS

## 2021-12-01 MED ORDER — ROCURONIUM BROMIDE 100 MG/10ML IV SOLN
INTRAVENOUS | Status: DC | PRN
  Administered 2021-12-01: 50 mg via INTRAVENOUS

## 2021-12-01 MED ORDER — ORAL CARE MOUTH RINSE: 15.0000 mL | Freq: Once | OROMUCOSAL | Status: AC

## 2021-12-01 MED ORDER — SUGAMMADEX SODIUM 200 MG/2ML IV SOLN
INTRAVENOUS | Status: DC | PRN
  Administered 2021-12-01: 200 mg via INTRAVENOUS

## 2021-12-01 MED ORDER — PROPOFOL 500 MG/50ML IV EMUL
INTRAVENOUS | Status: DC | PRN
  Administered 2021-12-01: 125 ug/kg/min via INTRAVENOUS

## 2021-12-01 MED ORDER — FENTANYL CITRATE (PF) 100 MCG/2ML IJ SOLN
INTRAMUSCULAR | Status: AC
  Filled 2021-12-01: qty 2

## 2021-12-01 MED ORDER — MIDAZOLAM HCL 2 MG/2ML IJ SOLN
INTRAMUSCULAR | Status: DC | PRN
  Administered 2021-12-01: 2 mg via INTRAVENOUS

## 2021-12-01 MED ORDER — MIDAZOLAM HCL 2 MG/2ML IJ SOLN
INTRAMUSCULAR | Status: AC
  Filled 2021-12-01: qty 2

## 2021-12-01 MED ORDER — CHLORHEXIDINE GLUCONATE 0.12 % MT SOLN
OROMUCOSAL | Status: AC
  Administered 2021-12-01: 15 mL via OROMUCOSAL
  Filled 2021-12-01: qty 15

## 2021-12-01 MED ORDER — OXYBUTYNIN CHLORIDE 5 MG PO TABS
ORAL_TABLET | ORAL | Status: AC
  Filled 2021-12-01: qty 1

## 2021-12-01 MED ORDER — CEFAZOLIN SODIUM-DEXTROSE 2-4 GM/100ML-% IV SOLN
2.0000 g | INTRAVENOUS | Status: AC
  Administered 2021-12-01: 2 g via INTRAVENOUS

## 2021-12-01 MED ORDER — IPRATROPIUM-ALBUTEROL 0.5-2.5 (3) MG/3ML IN SOLN
RESPIRATORY_TRACT | Status: AC
  Administered 2021-12-01: 3 mL via RESPIRATORY_TRACT
  Filled 2021-12-01: qty 3

## 2021-12-01 MED ORDER — FENTANYL CITRATE (PF) 100 MCG/2ML IJ SOLN
25.0000 ug | INTRAMUSCULAR | Status: DC | PRN
  Administered 2021-12-01 (×2): 25 ug via INTRAVENOUS

## 2021-12-01 MED ORDER — OXYBUTYNIN CHLORIDE 5 MG PO TABS: 5.0000 mg | ORAL_TABLET | Freq: Three times a day (TID) | ORAL | 0 refills | Status: DC | PRN

## 2021-12-01 MED ORDER — CHLORHEXIDINE GLUCONATE 0.12 % MT SOLN: 15.0000 mL | Freq: Once | OROMUCOSAL | Status: AC

## 2021-12-01 MED ORDER — LIDOCAINE HCL (CARDIAC) PF 100 MG/5ML IV SOSY
PREFILLED_SYRINGE | INTRAVENOUS | Status: DC | PRN
  Administered 2021-12-01: 100 mg via INTRAVENOUS

## 2021-12-01 MED ORDER — OXYCODONE HCL 5 MG/5ML PO SOLN: 5.0000 mg | Freq: Once | ORAL | Status: AC | PRN

## 2021-12-01 MED ORDER — SODIUM CHLORIDE 0.9 % IR SOLN
Status: DC | PRN
  Administered 2021-12-01: 3000 mL

## 2021-12-01 MED ORDER — ESMOLOL HCL 100 MG/10ML IV SOLN
INTRAVENOUS | Status: DC | PRN
  Administered 2021-12-01 (×2): 20 mg via INTRAVENOUS

## 2021-12-01 MED ORDER — CIPROFLOXACIN IN D5W 400 MG/200ML IV SOLN
400.0000 mg | Freq: Once | INTRAVENOUS | Status: AC
  Administered 2021-12-01: 400 mg via INTRAVENOUS

## 2021-12-01 MED ORDER — OXYBUTYNIN CHLORIDE 5 MG PO TABS
5.0000 mg | ORAL_TABLET | Freq: Once | ORAL | Status: AC
Start: 2021-12-01 — End: 2021-12-01
  Administered 2021-12-01: 5 mg via ORAL

## 2021-12-01 MED ORDER — KETOROLAC TROMETHAMINE 30 MG/ML IJ SOLN
INTRAMUSCULAR | Status: DC | PRN
  Administered 2021-12-01: 15 mg via INTRAVENOUS

## 2021-12-01 MED ORDER — IPRATROPIUM-ALBUTEROL 0.5-2.5 (3) MG/3ML IN SOLN
3.0000 mL | Freq: Once | RESPIRATORY_TRACT | Status: AC
Start: 2021-12-01 — End: 2021-12-01

## 2021-12-01 MED ORDER — GLYCOPYRROLATE 0.2 MG/ML IJ SOLN
INTRAMUSCULAR | Status: DC | PRN
  Administered 2021-12-01: .2 mg via INTRAVENOUS

## 2021-12-01 MED ORDER — IOHEXOL 180 MG/ML  SOLN
INTRAMUSCULAR | Status: DC | PRN
  Administered 2021-12-01: 30 mL

## 2021-12-01 MED ORDER — CIPROFLOXACIN IN D5W 400 MG/200ML IV SOLN
INTRAVENOUS | Status: AC
  Filled 2021-12-01: qty 200

## 2021-12-01 MED ORDER — ONDANSETRON HCL 4 MG/2ML IJ SOLN
INTRAMUSCULAR | Status: DC | PRN
  Administered 2021-12-01 (×2): 4 mg via INTRAVENOUS

## 2021-12-01 MED ORDER — OXYCODONE HCL 5 MG PO TABS: 5.0000 mg | ORAL_TABLET | ORAL | 0 refills | Status: DC | PRN

## 2021-12-01 MED ORDER — OXYCODONE HCL 5 MG PO TABS
ORAL_TABLET | ORAL | Status: AC
  Filled 2021-12-01: qty 1

## 2021-12-01 MED ORDER — ACETAMINOPHEN 10 MG/ML IV SOLN
INTRAVENOUS | Status: DC | PRN
  Administered 2021-12-01: 1000 mg via INTRAVENOUS

## 2021-12-01 MED ORDER — DEXAMETHASONE SODIUM PHOSPHATE 10 MG/ML IJ SOLN
INTRAMUSCULAR | Status: DC | PRN
  Administered 2021-12-01: 10 mg via INTRAVENOUS

## 2021-12-01 SURGICAL SUPPLY — 28 items
ADHESIVE MASTISOL STRL (MISCELLANEOUS) IMPLANT
BAG DRAIN SIEMENS DORNER NS (MISCELLANEOUS) ×2 IMPLANT
BASKET ZERO TIP 1.9FR (BASKET) IMPLANT
BRUSH SCRUB EZ 1% IODOPHOR (MISCELLANEOUS) ×2 IMPLANT
CATH URET FLEX-TIP 2 LUMEN 10F (CATHETERS) ×1 IMPLANT
CATH URETL OPEN 5X70 (CATHETERS) ×2 IMPLANT
CNTNR SPEC 2.5X3XGRAD LEK (MISCELLANEOUS)
CONT SPEC 4OZ STER OR WHT (MISCELLANEOUS)
CONTAINER SPEC 2.5X3XGRAD LEK (MISCELLANEOUS) IMPLANT
DRAPE UTILITY 15X26 TOWEL STRL (DRAPES) ×2 IMPLANT
DRSG TEGADERM 2-3/8X2-3/4 SM (GAUZE/BANDAGES/DRESSINGS) IMPLANT
GLOVE BIO SURGEON STRL SZ 6.5 (GLOVE) ×2 IMPLANT
GOWN STRL REUS W/ TWL LRG LVL3 (GOWN DISPOSABLE) ×2 IMPLANT
GOWN STRL REUS W/TWL LRG LVL3 (GOWN DISPOSABLE) ×2
GUIDEWIRE GREEN .038 145CM (MISCELLANEOUS) IMPLANT
GUIDEWIRE STR DUAL SENSOR (WIRE) ×3 IMPLANT
IV NS IRRIG 3000ML ARTHROMATIC (IV SOLUTION) ×2 IMPLANT
KIT TURNOVER CYSTO (KITS) ×2 IMPLANT
PACK CYSTO AR (MISCELLANEOUS) ×2 IMPLANT
SET CYSTO W/LG BORE CLAMP LF (SET/KITS/TRAYS/PACK) ×2 IMPLANT
SHEATH NAVIGATOR HD 12/14X36 (SHEATH) IMPLANT
STENT URET 6FRX24 CONTOUR (STENTS) ×1 IMPLANT
STENT URET 6FRX26 CONTOUR (STENTS) IMPLANT
SURGILUBE 2OZ TUBE FLIPTOP (MISCELLANEOUS) ×2 IMPLANT
TRACTIP FLEXIVA PULSE ID 200 (Laser) ×2 IMPLANT
TRAP FLUID SMOKE EVACUATOR (MISCELLANEOUS) ×2 IMPLANT
WATER STERILE IRR 1000ML POUR (IV SOLUTION) ×2 IMPLANT
WATER STERILE IRR 500ML POUR (IV SOLUTION) ×2 IMPLANT

## 2021-12-01 NOTE — Anesthesia Procedure Notes (Signed)
Procedure Name: Intubation Date/Time: 12/01/2021 11:04 AM  Performed by: Mohammed Kindle, CRNAPre-anesthesia Checklist: Patient identified, Emergency Drugs available, Suction available and Patient being monitored Patient Re-evaluated:Patient Re-evaluated prior to induction Oxygen Delivery Method: Circle system utilized Preoxygenation: Pre-oxygenation with 100% oxygen Induction Type: IV induction Ventilation: Mask ventilation without difficulty Laryngoscope Size: McGraph and 3 Grade View: Grade I Tube type: Oral Tube size: 6.5 mm Number of attempts: 1 Airway Equipment and Method: Stylet and Oral airway Placement Confirmation: ETT inserted through vocal cords under direct vision, positive ETCO2, breath sounds checked- equal and bilateral and CO2 detector Secured at: 21 cm Tube secured with: Tape Dental Injury: Teeth and Oropharynx as per pre-operative assessment

## 2021-12-01 NOTE — Telephone Encounter (Signed)
-----   Message from Venetia Night, MD sent at 12/01/2021  9:52 AM EDT ----- Elvina Sidle-  I just ran into this patient in preop.  She is having a urology procedure.  I got some clarification from her.  She'd like to continue seeing Dr. Mattie Marlin for pain mgmt.  She just doesn't want to be affiliated with the reps in [place] any more (which is already solved).  She is probably going to reach out to schedule a reevaluation.   [employer]

## 2021-12-01 NOTE — Interval H&P Note (Deleted)
History and Physical Interval Note:  12/01/2021 10:40 AM  Megan [name]  has presented today for surgery, with the diagnosis of Bilateral Nephrolithiasis.  The various methods of treatment have been discussed with the patient and family. After consideration of risks, benefits and other options for treatment, the patient has consented to  Procedure(s): CYSTOSCOPY/URETEROSCOPY/HOLMIUM LASER/STENT PLACEMENT (Bilateral) as a surgical intervention.  The patient's history has been reviewed, patient examined, no change in status, stable for surgery.  I have reviewed the patient's chart and labs.  Questions were answered to the patient's satisfaction.    RRR CTAB   Vanna Scotland

## 2021-12-01 NOTE — Op Note (Signed)
Date of procedure: 12/01/21  Preoperative diagnosis:  Bilateral kidney stones Right flank pain   Postoperative diagnosis:  same   Procedure: Bilateral ureteroscopy with laser lithotripsy Bilateral retrograde pyelogram Bilateral ureteral stent placement Interpretation of fluoroscopy less than 30 minutes  Surgeon: Vanna Scotland, MD  Anesthesia: General  Complications: None  Intraoperative findings:   EBL: Minimal  Specimens: None  Drains: 624 French double-J ureteral stent bilaterally on tether  Indication: Megan [name] is a 61 y.o. patient with bilateral nonobstructing stones along with a 9 mm right renal pelvic stone.  After reviewing the management options for treatment, she elected to proceed with the above surgical procedure(s). We have discussed the potential benefits and risks of the procedure, side effects of the proposed treatment, the likelihood of the patient achieving the goals of the procedure, and any potential problems that might occur during the procedure or recuperation. Informed consent has been obtained.  Description of procedure:  The patient was taken to the operating room and general anesthesia was induced.  The patient was placed in the dorsal lithotomy position, prepped and draped in the usual sterile fashion, and preoperative antibiotics were administered. A preoperative time-out was performed.   A 21 French cystoscope was advanced per urethra into the bladder.  On scout imaging, the stone could be seen near the level UPJ versus renal pelvis on the right side.  An open-ended ureteral catheter was placed just within the UO and a gentle retrograde pyelogram on the side did confirm a filling defect at this level without hydronephrosis.  A sensor wire was then placed up to the level of the kidney.  I used a dual-lumen sheath to introduce a second Super Stiff wire up to the level of the kidney.  A 12/14 French ureteral access sheath was then advanced up to the  proximal ureter which went quite smoothly under fluoroscopic guidance over the Super Stiff wire.  The sensor wire was excluded and snapped in place as a safety wire.  I then introduced a digital flexible dual-lumen ureteroscope into the kidney where the 9 mm stone was encountered in the renal pelvis.  A 242 m laser fiber was used using settings of 0.3 J and 60 Hz to dust the stone.  I then entered each and every calyx and dusted additional nonobstructing stone burden until there was no significant residual stone burden within the entirety of the kidney greater than the size of the tip of the laser fiber.  Additional contrast was injected to ensure that each and every calyx was directly visualized.  There was no contrast extravasation.  The scope was then backed down the length of the ureter inspecting along the way.  There were no ureteral injuries appreciated and no ureteral injuries appreciated.  A 6 x 24 French double-J ureteral stent was then advanced over the safety wire up to the level of the kidney.  Upon wire removal, there was a focal noted both within the renal pelvis as well as within the bladder.  The stent string was left attached to this.  Next, attention was turned to the left side.  The same exact process were used used to introduce a ureteral access sheath and the digital flexible scope.  The mid upper pole stone within a fairly stenotic infundibulum was identified no additional stone beyond this.  The stone was dusted.  Each every calyx was directly visualized.  Additional contrast was injected and no hydronephrosis was appreciated.  After the stone was completely fragmented, the  scope was backed down the length the ureter.  There was a small mucosal abrasion but no significant trauma, no stone burden residual.  A 6 x 24 French double-J ureteral stent was advanced over this wire up to the level of the kidney.  Upon wire removal, there was a full coil within the renal pelvis as well as within the  bladder.  Both of the stent strings were attached to the patient's left inner thigh using Mastisol and Tegaderm.  The bladder was drained.  She was cleaned and dried, repositioned in supine position, reversed from anesthesia, and taken to the PACU in stable condition.  Plan: We will have her remove her own stent on Monday next week.  Follow-up in 4 weeks with renal ultrasound prior. Vanna Scotland, M.D.

## 2021-12-01 NOTE — Anesthesia Preprocedure Evaluation (Addendum)
Anesthesia Evaluation  Patient identified by MRN, date of birth, ID band Patient awake    Reviewed: Allergy & Precautions, NPO status , Patient's Chart, lab work & pertinent test results  History of Anesthesia Complications (+) PONV and history of anesthetic complications  Airway Mallampati: III  TM Distance: >3 FB Neck ROM: full    Dental  (+) Teeth Intact, Dental Advidsory Given   Pulmonary neg shortness of breath, asthma , neg COPD, neg recent URI,     + decreased breath sounds      Cardiovascular hypertension, (-) angina(-) Past MI and (-) CABG Normal cardiovascular exam+ Valvular Problems/Murmurs MR      Neuro/Psych negative neurological ROS  negative psych ROS   GI/Hepatic Neg liver ROS, GERD  ,  Endo/Other  Hypothyroidism   Renal/GU      Musculoskeletal   Abdominal   Peds  Hematology negative hematology ROS (+)   Anesthesia Other Findings Patient with PMH of asthma and states it has been worse the past few weeks. This morning she said she took her inhaler. On physical exam, decreased breathe sounds were heard. Will give the patient a duoneb before surgery.  Past Medical History: No date: Anemia No date: Arthritis No date: Asthma No date: DDD (degenerative disc disease), lumbosacral 2008: DVT (deep venous thrombosis) (HCC)     Comment:  left femeral s/p femeral osteotomy No date: Dyspnea No date: Facet syndrome, lumbar No date: Family history of adverse reaction to anesthesia     Comment:  mother had reaction but doesn't know what it is No date: GERD (gastroesophageal reflux disease) No date: Heart murmur No date: History of kidney stones No date: Hypertension No date: Hypothyroidism No date: Lumbar foraminal stenosis No date: Medullary sponge kidney No date: Pneumonia No date: Polycystic ovary No date: PONV (postoperative nausea and vomiting) No date: Pre-diabetes No date: Renal disorder No  date: Thyroid disease No date: Trigeminal neuralgia  Past Surgical History: No date: ABDOMINAL HYSTERECTOMY 1980: APPENDECTOMY 06/29/2019: CYSTOSCOPY/URETEROSCOPY/HOLMIUM LASER/STENT PLACEMENT;  Right     Comment:  Procedure: CYSTOSCOPY/URETEROSCOPY/HOLMIUM LASER/STENT               PLACEMENT;  Surgeon: Vanna Scotland, MD;  Location: [facility]              ORS;  Service: Urology;  Laterality: Right; 07/15/2020: CYSTOSCOPY/URETEROSCOPY/HOLMIUM LASER/STENT PLACEMENT No date: ESOPHAGOGASTRODUODENOSCOPY     Comment:  2021, 08/16/2020, 11/13/2020 No date: FEMORAL OSTEOTOMY W/ APPLICATION EXTERNAL FIXATOR; Right No date: KNEE ARTHROSCOPY W/ ACL RECONSTRUCTION; Left 01/13/2017: KNEE ARTHROSCOPY W/ HARDWARE REMOVAL; Right 02/12/2020: LAPAROSCOPIC GASTRIC BYPASS No date: LITHOTRIPSY 01/22/2021: LUMBAR LAMINECTOMY/DECOMPRESSION MICRODISCECTOMY; Right     Comment:  Procedure: L4-5 DECOMPRESSION;  Surgeon: Venetia Night, MD;  Location: [facility] ORS;  Service: Neurosurgery;              Laterality: Right; 2017: OSTEOTOMY; Bilateral     Comment:  femoral shaft/supracondylar 09/03/2021: PULSE GENERATOR IMPLANT; N/A     Comment:  Procedure: PULSE GENERATOR IMPLANT;  Surgeon: Venetia Night, MD;  Location: [facility] ORS;  Service: Neurosurgery;              Laterality: N/A; No date: ROTATOR CUFF REPAIR; Right 09/03/2021: THORACIC LAMINECTOMY FOR SPINAL CORD STIMULATOR; N/A     Comment:  Procedure: THORACIC LAMINECTOMY FOR SPINAL CORD  STIMULATOR PLACEMENT ([employer]);  Surgeon:               Venetia Night, MD;  Location: [facility] ORS;  Service:               Neurosurgery;  Laterality: N/A; 1964: TONSILLECTOMY 11/11/2018: TOTAL KNEE ARTHROPLASTY; Right  BMI    Body Mass Index: 25.69 kg/m      Reproductive/Obstetrics negative OB ROS                            Anesthesia Physical Anesthesia Plan  ASA: 3  Anesthesia Plan:  General ETT   Post-op Pain Management:    Induction: Intravenous  PONV Risk Score and Plan: 4 or greater and Ondansetron, Dexamethasone, Propofol infusion and TIVA  Airway Management Planned: Oral ETT  Additional Equipment:   Intra-op Plan:   Post-operative Plan: Extubation in OR  Informed Consent: I have reviewed the patients History and Physical, chart, labs and discussed the procedure including the risks, benefits and alternatives for the proposed anesthesia with the patient or authorized representative who has indicated his/her understanding and acceptance.     Dental Advisory Given  Plan Discussed with: Anesthesiologist, CRNA and Surgeon  Anesthesia Plan Comments: (Patient consented for risks of anesthesia including but not limited to:  - adverse reactions to medications - damage to eyes, teeth, lips or other oral mucosa - nerve damage due to positioning  - sore throat or hoarseness - Damage to heart, brain, nerves, lungs, other parts of body or loss of life  Patient voiced understanding.)       Anesthesia Quick Evaluation

## 2021-12-01 NOTE — Transfer of Care (Signed)
Immediate Anesthesia Transfer of Care Note  Patient: Megan [name]  Procedure(s) Performed: CYSTOSCOPY/URETEROSCOPY/HOLMIUM LASER/STENT PLACEMENT (Bilateral: Ureter)  Patient Location: PACU  Anesthesia Type:General  Level of Consciousness: awake, drowsy and patient cooperative  Airway & Oxygen Therapy: Patient Spontanous Breathing and Patient connected to face mask oxygen  Post-op Assessment: Report given to RN and Post -op Vital signs reviewed and stable  Post vital signs: Reviewed and stable  Last Vitals:  Vitals Value Taken Time  BP 140/83 12/01/21 1152  Temp    Pulse 88 12/01/21 1153  Resp 23 12/01/21 1153  SpO2 100 % 12/01/21 1153  Vitals shown include unvalidated device data.  Last Pain:  Vitals:   12/01/21 0932  PainSc: 0-No pain         Complications: No notable events documented.

## 2021-12-01 NOTE — Discharge Instructions (Addendum)
You have a ureteral stent in place.  This is a tube that extends from your kidney to your bladder.  This may cause urinary bleeding, burning with urination, and urinary frequency.  Please call our office or present to the ED if you develop fevers >101 or pain which is not able to be controlled with oral pain medications.  You may be given either Flomax and/ or ditropan to help with bladder spasms and stent pain in addition to pain medications.    Your stents are on strings.  They are taped together on your left inner thigh.  Next week on Monday morning, you may untape and pull the sting gently until the entire stent is removed.  If you have any difficulty, please call our office.  Complete the course of antibiotics.  [facility] [address], [address] [place], [place] [address] [phone]     AMBULATORY SURGERY  DISCHARGE INSTRUCTIONS   The drugs that you were given will stay in your system until tomorrow so for the next 24 hours you should not:  Drive an automobile Make any legal decisions Drink any alcoholic beverage   You may resume regular meals tomorrow.  Today it is better to start with liquids and gradually work up to solid foods.  You may eat anything you prefer, but it is better to start with liquids, then soup and crackers, and gradually work up to solid foods.   Please notify your doctor immediately if you have any unusual bleeding, trouble breathing, redness and pain at the surgery site, drainage, fever, or pain not relieved by medication.    Your post-operative visit with Dr.                                       is: Date:                        Time:    Please call to schedule your post-operative visit.  Additional Instructions:

## 2021-12-01 NOTE — Anesthesia Postprocedure Evaluation (Signed)
Anesthesia Post Note  Patient: Megan [name]  Procedure(s) Performed: CYSTOSCOPY/URETEROSCOPY/HOLMIUM LASER/STENT PLACEMENT (Bilateral: Ureter)  Patient location during evaluation: PACU Anesthesia Type: General Level of consciousness: awake and alert Pain management: pain level controlled Vital Signs Assessment: post-procedure vital signs reviewed and stable Respiratory status: spontaneous breathing, nonlabored ventilation, respiratory function stable and patient connected to nasal cannula oxygen Cardiovascular status: blood pressure returned to baseline and stable Postop Assessment: no apparent nausea or vomiting Anesthetic complications: no   No notable events documented.   Last Vitals:  Vitals:   12/01/21 1215 12/01/21 1233  BP: (!) 141/86 (!) 146/84  Pulse: 88 85  Resp: 17 15  Temp: 36.4 C 36.7 C  SpO2: 97% 98%    Last Pain:  Vitals:   12/01/21 1233  TempSrc: Temporal  PainSc:                  Stephanie Coup

## 2021-12-01 NOTE — Interval H&P Note (Signed)
History and Physical Interval Note:  12/01/2021 10:45 AM  Megan [name]  has presented today for surgery, with the diagnosis of Bilateral Nephrolithiasis.  The various methods of treatment have been discussed with the patient and family. After consideration of risks, benefits and other options for treatment, the patient has consented to  Procedure(s): CYSTOSCOPY/URETEROSCOPY/HOLMIUM LASER/STENT PLACEMENT (Bilateral) as a surgical intervention.  The patient's history has been reviewed, patient examined, no change in status, stable for surgery.  I have reviewed the patient's chart and labs.  Questions were answered to the patient's satisfaction.    RRR CTAB  Preoperative urine culture grew very low colony count, 900 of Enterococcus.  Urine analysis was otherwise negative other than microscopic blood.  Unfortunately, she did not take the preoperative antibiotic as she did not have access to pharmacy over the weekend.  She adamantly denies any urinary issues including no dysuria fevers or chills.  She does not feel like she has a urinary tract infection.  I have offered to reschedule this to when she has completed her preoperative antibiotics but she was adamantly against this and understands the very minimal risk of sepsis.  I explained my concerns to both her and her husband.  Given that I do think that this probably contamination because willing to proceed with understanding the risks fully.       Vanna Scotland

## 2021-12-02 ENCOUNTER — Other Ambulatory Visit: Payer: Self-pay

## 2021-12-02 ENCOUNTER — Encounter: Payer: Self-pay | Admitting: Urology

## 2021-12-02 DIAGNOSIS — N200 Calculus of kidney: Secondary | ICD-10-CM

## 2021-12-04 ENCOUNTER — Other Ambulatory Visit: Payer: Self-pay | Admitting: *Deleted

## 2021-12-04 MED ORDER — TRAMADOL HCL 50 MG PO TABS: 50.0000 mg | ORAL_TABLET | Freq: Four times a day (QID) | ORAL | 0 refills | Status: DC | PRN

## 2021-12-04 MED ORDER — ONDANSETRON 4 MG PO TBDP: 4.0000 mg | ORAL_TABLET | Freq: Three times a day (TID) | ORAL | 0 refills | Status: DC | PRN

## 2021-12-04 NOTE — Telephone Encounter (Addendum)
Patient called with mild nausea. She has not be tolerating the stents in place. Per Dr. Apolinar Junes to NOT take stents out until Monday. Will send in Zofran for patient to help with symptoms of nausea and have samples of Gemtesa to help with urgency and frequency.Sent in Tramadol to tolerate better.   Left patient a VM to confirm details.

## 2021-12-08 ENCOUNTER — Encounter: Payer: BC Managed Care – PPO | Admitting: Obstetrics & Gynecology

## 2021-12-10 ENCOUNTER — Encounter: Payer: BC Managed Care – PPO | Admitting: Urology

## 2021-12-23 ENCOUNTER — Other Ambulatory Visit: Payer: Self-pay | Admitting: Urology

## 2021-12-26 ENCOUNTER — Ambulatory Visit: Admission: RE | Admit: 2021-12-26 | Payer: BC Managed Care – PPO | Source: Ambulatory Visit

## 2021-12-29 ENCOUNTER — Encounter: Payer: BC Managed Care – PPO | Admitting: Obstetrics & Gynecology

## 2021-12-30 ENCOUNTER — Ambulatory Visit: Payer: BC Managed Care – PPO | Admitting: Urology

## 2022-01-02 ENCOUNTER — Ambulatory Visit
Admission: RE | Admit: 2022-01-02 | Discharge: 2022-01-02 | Disposition: A | Discharge status: Home / Self Care | Payer: BC Managed Care – PPO | Source: Ambulatory Visit | Attending: Urology | Admitting: Urology

## 2022-01-02 DIAGNOSIS — N200 Calculus of kidney: Secondary | ICD-10-CM

## 2022-01-05 ENCOUNTER — Encounter: Payer: Self-pay | Admitting: Urology

## 2022-01-06 ENCOUNTER — Telehealth (INDEPENDENT_AMBULATORY_CARE_PROVIDER_SITE_OTHER): Payer: BC Managed Care – PPO | Admitting: Urology

## 2022-01-06 ENCOUNTER — Other Ambulatory Visit: Payer: Self-pay | Admitting: Urology

## 2022-01-06 ENCOUNTER — Encounter: Payer: Self-pay | Admitting: Urology

## 2022-01-06 VITALS — Ht 63.0 in | Wt 145.0 lb

## 2022-01-06 DIAGNOSIS — N200 Calculus of kidney: Secondary | ICD-10-CM

## 2022-01-06 NOTE — Progress Notes (Signed)
Virtual Visit via Video Note  I connected with Delmer Islam on 01/06/22 at  4:00 PM EDT by a video enabled telemedicine application and verified that I am speaking with the correct person using two identifiers.  Location: Patient: Home Provider: Office   I discussed the limitations of evaluation and management by telemedicine and the availability of in person appointments. The patient expressed understanding and agreed to proceed.  History of Present Illness: 61 year old female with a personal history of extensive nephrolithiasis who returns today for postop following bilateral ureteroscopy.  She is an Therapist, sports since the procedure, she has no flank pain, urinary symptoms, or any other signs or symptoms of complications.  Follow-up renal ultrasound shows no stone burden or residual hydronephrosis-this study was personally reviewed by me and agree with radiologic interpretation.  Review of previous records indicate extensive history, originally followed in [place] by Dr. Reather Converse.  She had multiple 51-ZGYF urine metabolic evaluations.  She is been on multiple preventative medications which do not seem to be helpful or poorly tolerated.  Stones are related to gastric surgery.   Observations/Objective: Appears well  Assessment and Plan:  1. Bilateral nephrolithiasis Uncomplicated bilateral ureteroscopy  Currently stone free  She is done best with very low oxalate diets.  She will try to do her best to lifestyle although this is difficult given her various food intolerances.  Encouraged adequate hydration.  Offered another 74-BSWH metabolic evaluation, declined    Follow Up Instructions: Follow-up in 1 year with [facility] or sooner as needed   I discussed the assessment and treatment plan with the patient. The patient was provided an opportunity to ask questions and all were answered. The patient agreed with the plan and demonstrated an understanding of the instructions.   The patient was  advised to call back or seek an in-person evaluation if the symptoms worsen or if the condition fails to improve as anticipated.  I provided 12 minutes of non-face-to-face time during this encounter.   Hollice Espy, MD

## 2022-01-08 NOTE — Progress Notes (Unsigned)
ANNUAL PREVENTATIVE CARE GYNECOLOGY  ENCOUNTER NOTE  Subjective:       Megan [name] is a 61 y.o. female here to establish care, and discuss management of her postmenopausal HRT.  Previously seen at [facility] (Dr. Ouida [name]). The patient is sexually active, but notes issues with decreased libido since her hysterectomy ~ 10 or 11 year ago. The patient is currently taking hormone replacement therapy, has been on medication  for ~ 10-11 years after removal of ovaries. Patient denies post-menopausal vaginal bleeding.  oThe patient participates in regular exercise: no. Has the patient ever been transfused or tattooed?: yes.    Megan [name] has the following concerns today:  HRT - Reports that she was on Vivelle dot for many years which was covered by insurance but now is not. Notes that her PCP attempted multiple times to get covered by insurance but was unsuccessful. Was transitioned to oral Estradiol but notes that it  never truly controlled her menopausal symptoms (mood swings, cravings, hot flushes while on meds). Was doing well on this medication and did not have blisters. After insurance declined coverage patient was transitioned to generic, which caused blisters (notes trying 2 different generics).  Recently restarted on Vivelle Dot (1-2 months ago) but is paying out of pocket (using [facility], but prescription is still ~ $150-180 per month)  Decreased libido - Reports decreased libido beginning shortly after her oophorectomy.  Reports that when seen at [facility], she was prescribe Estradiol and testosterone injections.  Did not start testosterone injections as she was concerned about being prescribed the medication when her labs were normal range.  She does report h/o trying testosterone cream in the past, which did not help her symptoms.  Wonders if other factors are related, including issues with poor sleep, using muscle relaxors. Also deals with chronic back pain, intermittently takes pain  medication (Tramadol).  Recently had neuro stimulator placed, is seeing a pain specialist. Is considering seeing a pain therapist. Notes that she does not have a desire to initiate sex, however once engaged, has no problems with achieving orgasm.    Gynecologic History No LMP recorded. Patient has had a hysterectomy. Contraception: status post hysterectomy Last Pap: Reports up to date, performed 2021. Results were: normal Last mammogram: 09/13/2019. Results were: normal Last Colonoscopy: Had Cologuard in 2021. Was normal.    Obstetric History OB History  No obstetric history on file.    Past Medical History:  Diagnosis Date   Anemia    Arthritis    Asthma    DDD (degenerative disc disease), lumbosacral    DVT (deep venous thrombosis) ([place]) 2008   left femeral s/p femeral osteotomy   Dyspnea    Facet syndrome, lumbar    Family history of adverse reaction to anesthesia    mother had reaction but doesn't know what it is   GERD (gastroesophageal reflux disease)    Heart murmur    History of kidney stones    Hypertension    Hypothyroidism    Lumbar foraminal stenosis    Medullary sponge kidney    Pneumonia    Polycystic ovary    PONV (postoperative nausea and vomiting)    Pre-diabetes    Renal disorder    Thyroid disease    Trigeminal neuralgia     No family history on file.  Past Surgical History:  Procedure Laterality Date   ABDOMINAL HYSTERECTOMY     APPENDECTOMY  1980   CYSTOSCOPY/URETEROSCOPY/HOLMIUM LASER/STENT PLACEMENT Right 06/29/2019   Procedure:  CYSTOSCOPY/URETEROSCOPY/HOLMIUM LASER/STENT PLACEMENT;  Surgeon: Megan [name], Ashley, [name];  Location: [facility];  Service: Urology;  Laterality: Right;   CYSTOSCOPY/URETEROSCOPY/HOLMIUM LASER/STENT PLACEMENT  07/15/2020   CYSTOSCOPY/URETEROSCOPY/HOLMIUM LASER/STENT PLACEMENT Bilateral 12/01/2021   Procedure: CYSTOSCOPY/URETEROSCOPY/HOLMIUM LASER/STENT PLACEMENT;  Surgeon: Megan [name], Ashley, [name];  Location: [facility];  Service:  Urology;  Laterality: Bilateral;   ESOPHAGOGASTRODUODENOSCOPY     2021, 08/16/2020, 11/13/2020   FEMORAL OSTEOTOMY W/ APPLICATION EXTERNAL FIXATOR Right    KNEE ARTHROSCOPY W/ ACL RECONSTRUCTION Left    KNEE ARTHROSCOPY W/ HARDWARE REMOVAL Right 01/13/2017   LAPAROSCOPIC GASTRIC BYPASS  02/12/2020   LITHOTRIPSY     LUMBAR LAMINECTOMY/DECOMPRESSION MICRODISCECTOMY Right 01/22/2021   Procedure: L4-5 DECOMPRESSION;  Surgeon: Venetia NightYarbrough, Chester, [name];  Location: [facility];  Service: Neurosurgery;  Laterality: Right;   OSTEOTOMY Bilateral 2017   femoral shaft/supracondylar   PULSE GENERATOR IMPLANT N/A 09/03/2021   Procedure: PULSE GENERATOR IMPLANT;  Surgeon: Venetia NightYarbrough, Chester, [name];  Location: [facility];  Service: Neurosurgery;  Laterality: N/A;   ROTATOR CUFF REPAIR Right    THORACIC LAMINECTOMY FOR SPINAL CORD STIMULATOR N/A 09/03/2021   Procedure: THORACIC LAMINECTOMY FOR SPINAL CORD STIMULATOR PLACEMENT ([employer]);  Surgeon: Venetia NightYarbrough, Chester, [name];  Location: [facility];  Service: Neurosurgery;  Laterality: N/A;   TONSILLECTOMY  1964   TOTAL KNEE ARTHROPLASTY Right 11/11/2018    Social History   Socioeconomic History   Marital status: Married    Spouse name: ,Megan [name]   Number of children: 2   Years of education: Not on file   Highest education level: Not on file  Occupational History   Not on file  Tobacco Use   Smoking status: Never   Smokeless tobacco: Never  Vaping Use   Vaping Use: Never used  Substance and Sexual Activity   Alcohol use: Not Currently   Drug use: Never   Sexual activity: Yes  Other Topics Concern   Not on file  Social History Narrative   Not on file   Social Determinants of Health   Financial Resource Strain: Not on file  Food Insecurity: Not on file  Transportation Needs: Not on file  Physical Activity: Not on file  Stress: Not on file  Social Connections: Not on file  Intimate Partner Violence: Not on file    Current Outpatient Medications on  File Prior to Visit  Medication Sig Dispense Refill   acetaminophen (TYLENOL) 325 MG tablet Take 2 tablets (650 mg total) by mouth every 4 (four) hours as needed for mild pain ((score 1 to 3) or temp > 100.5). 30 tablet 0   budesonide (PULMICORT) 0.5 MG/2ML nebulizer solution Take 0.5 mg by nebulization daily as needed (shortness of breath).     carbamazepine (TEGRETOL) 200 MG tablet Take 200 mg by mouth at bedtime. And PRN     Cholecalciferol (VITAMIN D3) 75 MCG (3000 UT) TABS Take 3,000 Units by mouth daily.     fluticasone (FLONASE) 50 MCG/ACT nasal spray Place 2 sprays into both nostrils daily.     Fluticasone-Salmeterol (ADVAIR) 250-50 MCG/DOSE AEPB Inhale 1 puff into the lungs every morning.     gabapentin (NEURONTIN) 300 MG capsule Take 600 mg by mouth at bedtime. And PRN     levalbuterol (XOPENEX HFA) 45 MCG/ACT inhaler Inhale 2 puffs into the lungs every 4 (four) hours as needed for wheezing.     levothyroxine (SYNTHROID) 50 MCG tablet Take 50 mcg by mouth daily before breakfast.     losartan (COZAAR) 50 MG tablet Take 75 mg by mouth at  bedtime.     misoprostol (CYTOTEC) 200 MCG tablet Take 200 mcg by mouth 4 (four) times daily.     montelukast (SINGULAIR) 10 MG tablet Take 10 mg by mouth at bedtime.     Multiple Vitamin (MULTIVITAMIN ADULT PO) Take 1 tablet by mouth daily.     nitrofurantoin, macrocrystal-monohydrate, (MACROBID) 100 MG capsule Take 1 capsule (100 mg total) by mouth 2 (two) times daily. 20 capsule 0   ondansetron (ZOFRAN-ODT) 4 MG disintegrating tablet Take 1 tablet (4 mg total) by mouth every 8 (eight) hours as needed for nausea or vomiting. 20 tablet 0   oxybutynin (DITROPAN) 5 MG tablet Take 1 tablet (5 mg total) by mouth every 8 (eight) hours as needed for bladder spasms. 30 tablet 0   oxyCODONE (ROXICODONE) 5 MG immediate release tablet Take 1 tablet (5 mg total) by mouth every 4 (four) hours as needed for severe pain. 15 tablet 0   pantoprazole (PROTONIX) 40 MG  tablet Take 40 mg by mouth 2 (two) times daily. And PRN for 3rd dose     tamsulosin (FLOMAX) 0.4 MG CAPS capsule TAKE 1 CAPSULE BY MOUTH EVERY DAY 30 capsule 0   tiZANidine (ZANAFLEX) 2 MG tablet Take 1-2 tablets (2-4 mg total) by mouth every 8 (eight) hours as needed for muscle spasms. 120 tablet 2   traMADol (ULTRAM) 50 MG tablet Take 1 tablet (50 mg total) by mouth every 6 (six) hours as needed. 30 tablet 0   No current facility-administered medications on file prior to visit.    Allergies  Allergen Reactions   Hydrocodone-Acetaminophen Shortness Of Breath    Headache, Vomiting, nightmares. Pt is able to take acetaminophen.     Latex Rash and Other (See Comments)    Blisters      Morphine Anaphylaxis, Shortness Of Breath, Nausea And Vomiting, Swelling and Other (See Comments)    Has and facial swelling    Nickel Swelling and Rash   Petrolatum Rash and Other (See Comments)    Blister All petroleum products per patient, facial swelling    Tape Rash    Pt can use paper tape Blisters, tegaderm okay Prefers paper tape   Erythromycin Nausea And Vomiting   Hydromorphone Hcl Nausea And Vomiting    Bad headache   Nsaids     Avoid due to bariatric surgery   Other Other (See Comments)    Generic medications per patient- redness and burning in gums    Sulfa Antibiotics Rash           Review of Systems See HPI for positive review of systems. All other systems negative.    Objective:   BP (!) 173/84   Pulse 66   Resp 16   Ht 5\' 3"  (1.6 m)   Wt 138 lb 12.8 oz (63 kg)   BMI 24.59 kg/m  General Exam: no acute distress.  Remainder of exam deferred (had recent physical performed in May 2023 by GYN at [facility]).    Labs: Lab Results  Component Value Date   WBC 5.8 08/22/2021   HGB 13.0 08/22/2021   HCT 40.7 08/22/2021   MCV 93.6 08/22/2021   PLT 250 08/22/2021    Lab Results  Component Value Date   CREATININE 0.72 08/22/2021   BUN 16 08/22/2021   NA  141 08/22/2021   K 4.2 08/22/2021   CL 105 08/22/2021   CO2 27 08/22/2021    Lab Results  Component Value Date   ALT 16  12/09/2020   AST 21 12/09/2020   ALKPHOS 82 12/09/2020   BILITOT 0.4 12/09/2020    No results found for: "CHOL", "HDL", "LDLCALC", "LDLDIRECT", "TRIG", "CHOLHDL"  No results found for: "TSH"  Lab Results  Component Value Date   HGBA1C 5.5 06/28/2019     Assessment:   1. Encounter for medical examination to establish care   2. 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 Decreased libido   4. History of DVT (deep vein thrombosis)      Plan:  - Pap: Up to date. Due in 1 year.  - Mammogram: Ordered.  - Colon Screening:   Up to date.  - Labs:  up to date.  - Discussion had regarding further management of her HRT.  Will attempt to contact insurance company regarding further coverage of medication. Also discussed option of non-hormonal management of menopausal symptoms. Reviewed risk of continued use of HRT due to h/o DVT. Patient notes this is why she was placed on transdermal to decrease risk (over oral therapy)  - Decreased libido, discussed that this may be multifactorial (physical, as well as mental component). Discussed utilization of pain therapist, as well as consideration of sex therapist. Patient declines use of IM testosterone as levels were normal. Discussed limited options for postmenopausal libido issues.  Could consider off label use of Viagra. Notes that her husband has a prescription, may try a dose to see if any improvement.   Return to Clinic - 9 months for annual exam. Return sooner for further management of decreased libido.    A total of 62 minutes were spent during this encounter, including review of previous progress notes, recent imaging and labs, face-to-face with time with patient involving counseling and coordination of care, as well as documentation for current visit.   Hildred Laser, [name] [facility]

## 2022-01-09 ENCOUNTER — Encounter: Payer: Self-pay | Admitting: Obstetrics and Gynecology

## 2022-01-09 ENCOUNTER — Ambulatory Visit (INDEPENDENT_AMBULATORY_CARE_PROVIDER_SITE_OTHER): Payer: BC Managed Care – PPO | Admitting: Obstetrics and Gynecology

## 2022-01-09 VITALS — BP 173/84 | HR 66 | Resp 16 | Ht 63.0 in | Wt 138.8 lb

## 2022-01-09 DIAGNOSIS — R6882 Decreased libido: Secondary | ICD-10-CM

## 2022-01-09 DIAGNOSIS — Z0000 Encounter for general adult medical examination without abnormal findings: Secondary | ICD-10-CM | POA: Diagnosis not present

## 2022-01-09 DIAGNOSIS — Z79890 Hormone replacement therapy (postmenopausal): Secondary | ICD-10-CM

## 2022-01-09 DIAGNOSIS — E8940 Asymptomatic postprocedural ovarian failure: Secondary | ICD-10-CM

## 2022-01-09 DIAGNOSIS — Z86718 Personal history of other venous thrombosis and embolism: Secondary | ICD-10-CM | POA: Diagnosis not present

## 2022-01-09 NOTE — Progress Notes (Incomplete)
ANNUAL PREVENTATIVE CARE GYNECOLOGY  ENCOUNTER NOTE  Subjective:       Megan [name] is a 61 y.o. female here to establish care, and discuss management of her postmenopausal HRT.  Previously seen at [facility] (Dr. Ouida Sills). The patient is sexually active, but notes issues with decreased libido since her hysterectomy ~ 10 or 11 year ago. The patient is currently taking hormone replacement therapy, has been on medication  for ~ 10-11 years after removal of ovaries. Patient denies post-menopausal vaginal bleeding.  oThe patient participates in regular exercise: no. Has the patient ever been transfused or tattooed?: yes.    Phenyx has the following concerns today:  HRT - Reports that she was on Vivelle dot for many years which was covered by insurance but now is not. Notes that her PCP attempted multiple times to get covered by insurance but was unsuccessful. Was transitioned to oral Estradiol but notes that it  never truly controlled her menopausal symptoms (mood swings, cravings, hot flushes while on meds). Was doing well on this medication and did not have blisters. After insurance declined coverage patient was transitioned to generic, which caused blisters (notes trying 2 different generics).  Recently restarted on Vivelle Dot (1-2 months ago) but is paying out of pocket (using [facility], but prescription is still ~ $150-180 per month)  Decreased libido - Reports decreased libido beginning shortly after her oophorectomy.  Reports that when seen at [facility], she was prescribe Estradiol and testosterone injections.  Did not start testosterone injections as she was concerned about being prescribed the medication when her labs were normal range.  She does report h/o trying testosterone cream in the past, which did not help her symptoms.  Wonders if other factors are related, including issues with poor sleep, using muscle relaxors. Also deals with chronic back pain, intermittently takes pain  medication (Tramadol).  Recently had neuro stimulator placed, is seeing a pain specialist. Is considering seeing a pain therapist.    Gynecologic History No LMP recorded. Patient has had a hysterectomy. Contraception: status post hysterectomy Last Pap: Reports up to date, performed 2021. Results were: normal Last mammogram: 09/13/2019. Results were: normal Last Colonoscopy:  Last Dexa Scan:    Obstetric History OB History  No obstetric history on file.    Past Medical History:  Diagnosis Date  . Anemia   . Arthritis   . Asthma   . DDD (degenerative disc disease), lumbosacral   . DVT (deep venous thrombosis) ([place]) 2008   left femeral s/p femeral osteotomy  . Dyspnea   . Facet syndrome, lumbar   . Family history of adverse reaction to anesthesia    mother had reaction but doesn't know what it is  . GERD (gastroesophageal reflux disease)   . Heart murmur   . History of kidney stones   . Hypertension   . Hypothyroidism   . Lumbar foraminal stenosis   . Medullary sponge kidney   . Pneumonia   . Polycystic ovary   . PONV (postoperative nausea and vomiting)   . Pre-diabetes   . Renal disorder   . Thyroid disease   . Trigeminal neuralgia     No family history on file.  Past Surgical History:  Procedure Laterality Date  . ABDOMINAL HYSTERECTOMY    . APPENDECTOMY  1980  . CYSTOSCOPY/URETEROSCOPY/HOLMIUM LASER/STENT PLACEMENT Right 06/29/2019   Procedure: CYSTOSCOPY/URETEROSCOPY/HOLMIUM LASER/STENT PLACEMENT;  Surgeon: Hollice Espy, MD;  Location: [facility] ORS;  Service: Urology;  Laterality: Right;  . CYSTOSCOPY/URETEROSCOPY/HOLMIUM LASER/STENT  PLACEMENT  07/15/2020  . CYSTOSCOPY/URETEROSCOPY/HOLMIUM LASER/STENT PLACEMENT Bilateral 12/01/2021   Procedure: CYSTOSCOPY/URETEROSCOPY/HOLMIUM LASER/STENT PLACEMENT;  Surgeon: Hollice Espy, MD;  Location: [facility] ORS;  Service: Urology;  Laterality: Bilateral;  . ESOPHAGOGASTRODUODENOSCOPY     2021, 08/16/2020, 11/13/2020  .  FEMORAL OSTEOTOMY W/ APPLICATION EXTERNAL FIXATOR Right   . KNEE ARTHROSCOPY W/ ACL RECONSTRUCTION Left   . KNEE ARTHROSCOPY W/ HARDWARE REMOVAL Right 01/13/2017  . LAPAROSCOPIC GASTRIC BYPASS  02/12/2020  . LITHOTRIPSY    . LUMBAR LAMINECTOMY/DECOMPRESSION MICRODISCECTOMY Right 01/22/2021   Procedure: L4-5 DECOMPRESSION;  Surgeon: Meade Maw, MD;  Location: [facility] ORS;  Service: Neurosurgery;  Laterality: Right;  . OSTEOTOMY Bilateral 2017   femoral shaft/supracondylar  . PULSE GENERATOR IMPLANT N/A 09/03/2021   Procedure: PULSE GENERATOR IMPLANT;  Surgeon: Meade Maw, MD;  Location: [facility] ORS;  Service: Neurosurgery;  Laterality: N/A;  . ROTATOR CUFF REPAIR Right   . THORACIC LAMINECTOMY FOR SPINAL CORD STIMULATOR N/A 09/03/2021   Procedure: THORACIC LAMINECTOMY FOR SPINAL CORD STIMULATOR PLACEMENT ([employer]);  Surgeon: Meade Maw, MD;  Location: [facility] OR[facility];  Service: Neurosurgery;  Laterality: N/A;  . TONSILLECTOMY  1964  . TOTAL KNEE ARTHROPLASTY Right 11/11/2018    Social History   Socioeconomic History  . Marital status: Married    Spouse name: ,Linna Hoff  . Number of children: 2  . Years of education: Not on file  . Highest education level: Not on file  Occupational History  . Not on file  Tobacco Use  . Smoking status: Never  . Smokeless tobacco: Never  Vaping Use  . Vaping Use: Never used  Substance and Sexual Activity  . Alcohol use: Not Currently  . Drug use: Never  . Sexual activity: Yes  Other Topics Concern  . Not on file  Social History Narrative  . Not on file   Social Determinants of Health   Financial Resource Strain: Not on file  Food Insecurity: Not on file  Transportation Needs: Not on file  Physical Activity: Not on file  Stress: Not on file  Social Connections: Not on file  Intimate Partner Violence: Not on file    Current Outpatient Medications on File Prior to Visit  Medication Sig Dispense Refill  . acetaminophen  (TYLENOL) 325 MG tablet Take 2 tablets (650 mg total) by mouth every 4 (four) hours as needed for mild pain ((score 1 to 3) or temp > 100.5). 30 tablet 0  . budesonide (PULMICORT) 0.5 MG/2ML nebulizer solution Take 0.5 mg by nebulization daily as needed (shortness of breath).    . carbamazepine (TEGRETOL) 200 MG tablet Take 200 mg by mouth at bedtime. And PRN    . Cholecalciferol (VITAMIN D3) 75 MCG (3000 UT) TABS Take 3,000 Units by mouth daily.    . fluticasone (FLONASE) 50 MCG/ACT nasal spray Place 2 sprays into both nostrils daily.    . Fluticasone-Salmeterol (ADVAIR) 250-50 MCG/DOSE AEPB Inhale 1 puff into the lungs every morning.    . gabapentin (NEURONTIN) 300 MG capsule Take 600 mg by mouth at bedtime. And PRN    . levalbuterol (XOPENEX HFA) 45 MCG/ACT inhaler Inhale 2 puffs into the lungs every 4 (four) hours as needed for wheezing.    Marland Kitchen levothyroxine (SYNTHROID) 50 MCG tablet Take 50 mcg by mouth daily before breakfast.    . losartan (COZAAR) 50 MG tablet Take 75 mg by mouth at bedtime.    . misoprostol (CYTOTEC) 200 MCG tablet Take 200 mcg by mouth 4 (four) times daily.    Marland Kitchen  montelukast (SINGULAIR) 10 MG tablet Take 10 mg by mouth at bedtime.    . Multiple Vitamin (MULTIVITAMIN ADULT PO) Take 1 tablet by mouth daily.    . nitrofurantoin, macrocrystal-monohydrate, (MACROBID) 100 MG capsule Take 1 capsule (100 mg total) by mouth 2 (two) times daily. 20 capsule 0  . ondansetron (ZOFRAN-ODT) 4 MG disintegrating tablet Take 1 tablet (4 mg total) by mouth every 8 (eight) hours as needed for nausea or vomiting. 20 tablet 0  . oxybutynin (DITROPAN) 5 MG tablet Take 1 tablet (5 mg total) by mouth every 8 (eight) hours as needed for bladder spasms. 30 tablet 0  . oxyCODONE (ROXICODONE) 5 MG immediate release tablet Take 1 tablet (5 mg total) by mouth every 4 (four) hours as needed for severe pain. 15 tablet 0  . pantoprazole (PROTONIX) 40 MG tablet Take 40 mg by mouth 2 (two) times daily. And PRN  for 3rd dose    . tamsulosin (FLOMAX) 0.4 MG CAPS capsule TAKE 1 CAPSULE BY MOUTH EVERY DAY 30 capsule 0  . tiZANidine (ZANAFLEX) 2 MG tablet Take 1-2 tablets (2-4 mg total) by mouth every 8 (eight) hours as needed for muscle spasms. 120 tablet 2  . traMADol (ULTRAM) 50 MG tablet Take 1 tablet (50 mg total) by mouth every 6 (six) hours as needed. 30 tablet 0   No current facility-administered medications on file prior to visit.    Allergies  Allergen Reactions  . Hydrocodone-Acetaminophen Shortness Of Breath    Headache, Vomiting, nightmares. Pt is able to take acetaminophen.    . Latex Rash and Other (See Comments)    Blisters     . Morphine Anaphylaxis, Shortness Of Breath, Nausea And Vomiting, Swelling and Other (See Comments)    Has and facial swelling   . Nickel Swelling and Rash  . Petrolatum Rash and Other (See Comments)    Blister All petroleum products per patient, facial swelling   . Tape Rash    Pt can use paper tape Blisters, tegaderm okay Prefers paper tape  . Erythromycin Nausea And Vomiting  . Hydromorphone Hcl Nausea And Vomiting    Bad headache  . Nsaids     Avoid due to bariatric surgery  . Other Other (See Comments)    Generic medications per patient- redness and burning in gums   . Sulfa Antibiotics Rash           Review of Systems ROS Review of Systems - General ROS: negative for - chills, fatigue, fever, hot flashes, night sweats, weight gain or weight loss Psychological ROS: negative for - anxiety, decreased libido, depression, mood swings, physical abuse or sexual abuse Ophthalmic ROS: negative for - blurry vision, eye pain or loss of vision ENT ROS: negative for - headaches, hearing change, visual changes or vocal changes Allergy and Immunology ROS: negative for - hives, itchy/watery eyes or seasonal allergies Hematological and Lymphatic ROS: negative for - bleeding problems, bruising, swollen lymph nodes or weight loss Endocrine ROS:  negative for - galactorrhea, hair pattern changes, hot flashes, malaise/lethargy, mood swings, palpitations, polydipsia/polyuria, skin changes, temperature intolerance or unexpected weight changes Breast ROS: negative for - new or changing breast lumps or nipple discharge Respiratory ROS: negative for - cough or shortness of breath Cardiovascular ROS: negative for - chest pain, irregular heartbeat, palpitations or shortness of breath Gastrointestinal ROS: no abdominal pain, change in bowel habits, or black or bloody stools Genito-Urinary ROS: no dysuria, trouble voiding, or hematuria Musculoskeletal ROS: negative for - joint  pain or joint stiffness Neurological ROS: negative for - bowel and bladder control changes Dermatological ROS: negative for rash and skin lesion changes   Objective:   There were no vitals taken for this visit. CONSTITUTIONAL: Well-developed, well-nourished female in no acute distress.  PSYCHIATRIC: Normal mood and affect. Normal behavior. Normal judgment and thought content. Severance: Alert and oriented to person, place, and time. Normal muscle tone coordination. No cranial nerve deficit noted. HENT:  Normocephalic, atraumatic, External right and left ear normal. Oropharynx is clear and moist EYES: Conjunctivae and EOM are normal. Pupils are equal, round, and reactive to light. No scleral icterus.  NECK: Normal range of motion, supple, no masses.  Normal thyroid.  SKIN: Skin is warm and dry. No rash noted. Not diaphoretic. No erythema. No pallor. CARDIOVASCULAR: Normal heart rate noted, regular rhythm, no murmur. RESPIRATORY: Clear to auscultation bilaterally. Effort and breath sounds normal, no problems with respiration noted. BREASTS: Symmetric in size. No masses, skin changes, nipple drainage, or lymphadenopathy. ABDOMEN: Soft, normal bowel sounds, no distention noted.  No tenderness, rebound or guarding.  BLADDER: Normal PELVIC:  Bladder {:311640}  Urethra:  {:311719}  Vulva: {:311722}  Vagina: {:311643}  Cervix: {:311644}  Uterus: {:311718}  Adnexa: {:311645}  RV: {Blank multiple:19196::"External Exam NormaI","No Rectal Masses","Normal Sphincter tone"}  MUSCULOSKELETAL: Normal range of motion. No tenderness.  No cyanosis, clubbing, or edema.  2+ distal pulses. LYMPHATIC: No Axillary, Supraclavicular, or Inguinal Adenopathy.   Labs: Lab Results  Component Value Date   WBC 5.8 08/22/2021   HGB 13.0 08/22/2021   HCT 40.7 08/22/2021   MCV 93.6 08/22/2021   PLT 250 08/22/2021    Lab Results  Component Value Date   CREATININE 0.72 08/22/2021   BUN 16 08/22/2021   NA 141 08/22/2021   K 4.2 08/22/2021   CL 105 08/22/2021   CO2 27 08/22/2021    Lab Results  Component Value Date   ALT 16 12/09/2020   AST 21 12/09/2020   ALKPHOS 82 12/09/2020   BILITOT 0.4 12/09/2020    No results found for: "CHOL", "HDL", "LDLCALC", "LDLDIRECT", "TRIG", "CHOLHDL"  No results found for: "TSH"  Lab Results  Component Value Date   HGBA1C 5.5 06/28/2019     Assessment:   No diagnosis found.   Plan:  Pap: Not needed Mammogram: {Blank multiple:19196::"***","Ordered","Not Ordered","Not Indicated"} Colon Screening:  {Blank multiple:19196::"***","Ordered","Not Ordered","Not Indicated"} Labs: {Blank multiple:19196::"Lipid 1","FBS","TSH","Hemoglobin A1C","Vit D Level""***"} Routine preventative health maintenance measures emphasized: {Blank multiple:19196::"Exercise/Diet/Weight control","Tobacco Warnings","Alcohol/Substance use risks","Stress Management","Peer Pressure Issues","Safe Sex"} COVID Vaccination status: Return to [place], [facility]

## 2022-01-18 ENCOUNTER — Other Ambulatory Visit: Payer: Self-pay | Admitting: Neurosurgery

## 2022-02-05 ENCOUNTER — Other Ambulatory Visit: Payer: Self-pay | Admitting: Infectious Diseases

## 2022-02-05 DIAGNOSIS — Z87898 Personal history of other specified conditions: Secondary | ICD-10-CM

## 2022-02-09 IMAGING — CR DG ABDOMEN 1V
1 series · 1 of 1 positions shown · non-contrast
Comparison: 02/02/2020, CT 06/02/2020

CLINICAL DATA: History of kidney stone

EXAM:
ABDOMEN - 1 VIEW

[dg abd 1 view]
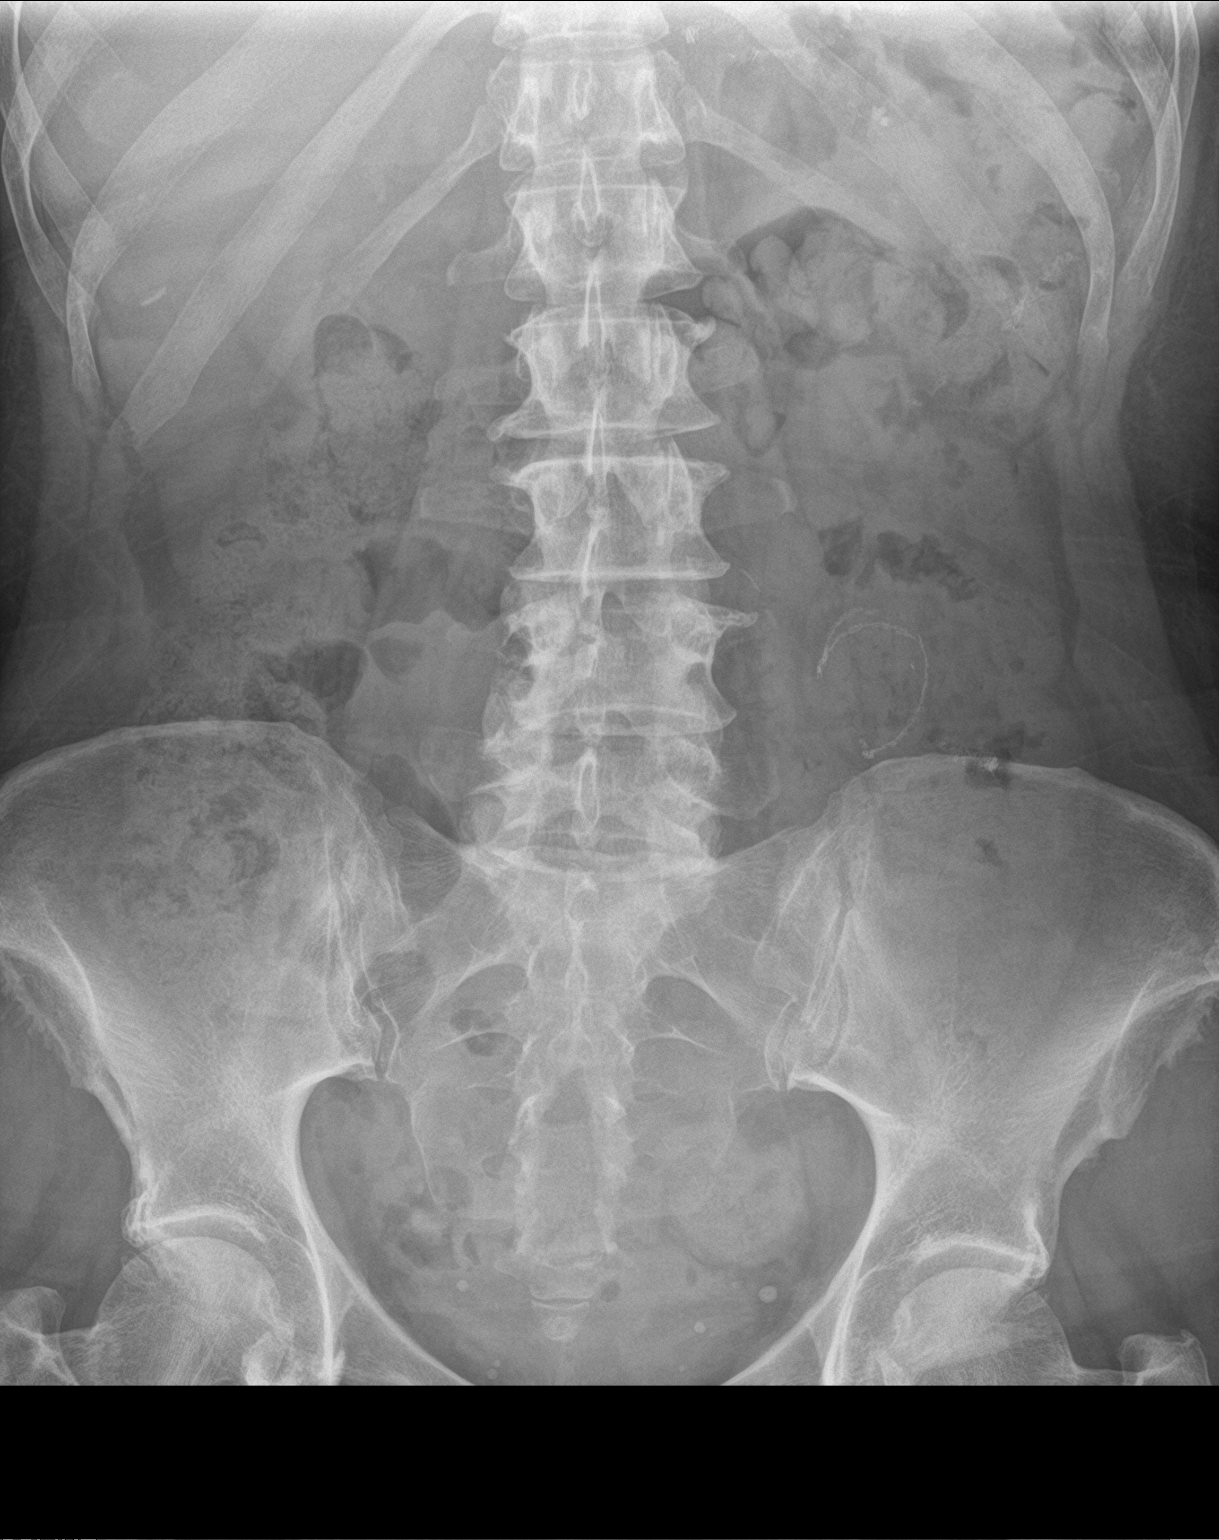

[1 of 1 positions shown; findings below may reference images not displayed]

FINDINGS: Nonobstructed gas pattern with moderate stool. Postsurgical changes
in the left mid abdomen. Small bilateral kidney stones, less well
visualized likely due to stool and bowel gas. Phleboliths in the
pelvis.
IMPRESSION: Bilateral kidney stones

## 2022-02-16 ENCOUNTER — Other Ambulatory Visit: Payer: BC Managed Care – PPO

## 2022-02-16 ENCOUNTER — Other Ambulatory Visit: Payer: Self-pay | Admitting: Pulmonary Disease

## 2022-02-16 DIAGNOSIS — Z8616 Personal history of COVID-19: Secondary | ICD-10-CM

## 2022-02-23 ENCOUNTER — Other Ambulatory Visit: Payer: BC Managed Care – PPO

## 2022-03-02 ENCOUNTER — Ambulatory Visit
Admission: RE | Admit: 2022-03-02 | Discharge: 2022-03-02 | Disposition: A | Discharge status: Home / Self Care | Payer: BC Managed Care – PPO | Source: Ambulatory Visit | Attending: Infectious Diseases | Admitting: Infectious Diseases

## 2022-03-02 DIAGNOSIS — Z87898 Personal history of other specified conditions: Secondary | ICD-10-CM

## 2022-04-01 ENCOUNTER — Other Ambulatory Visit: Payer: Self-pay | Admitting: Neurosurgery

## 2022-05-04 ENCOUNTER — Other Ambulatory Visit: Payer: Self-pay

## 2022-05-04 DIAGNOSIS — Z1231 Encounter for screening mammogram for malignant neoplasm of breast: Secondary | ICD-10-CM

## 2022-07-12 ENCOUNTER — Other Ambulatory Visit: Payer: Self-pay | Admitting: Neurosurgery

## 2022-08-31 ENCOUNTER — Other Ambulatory Visit: Payer: Self-pay | Admitting: Neurosurgery

## 2022-09-02 IMAGING — RF DG THORACIC SPINE 2V
1 series · 2 of 2 positions shown · non-contrast
Comparison: None Available.

CLINICAL DATA: Spinal cord stimulator

EXAM:
THORACIC SPINE 2 VIEWS

[Series 1: dg x-ray · 0.20mm/px · 2 of 2 slices shown]
[im 1/2]
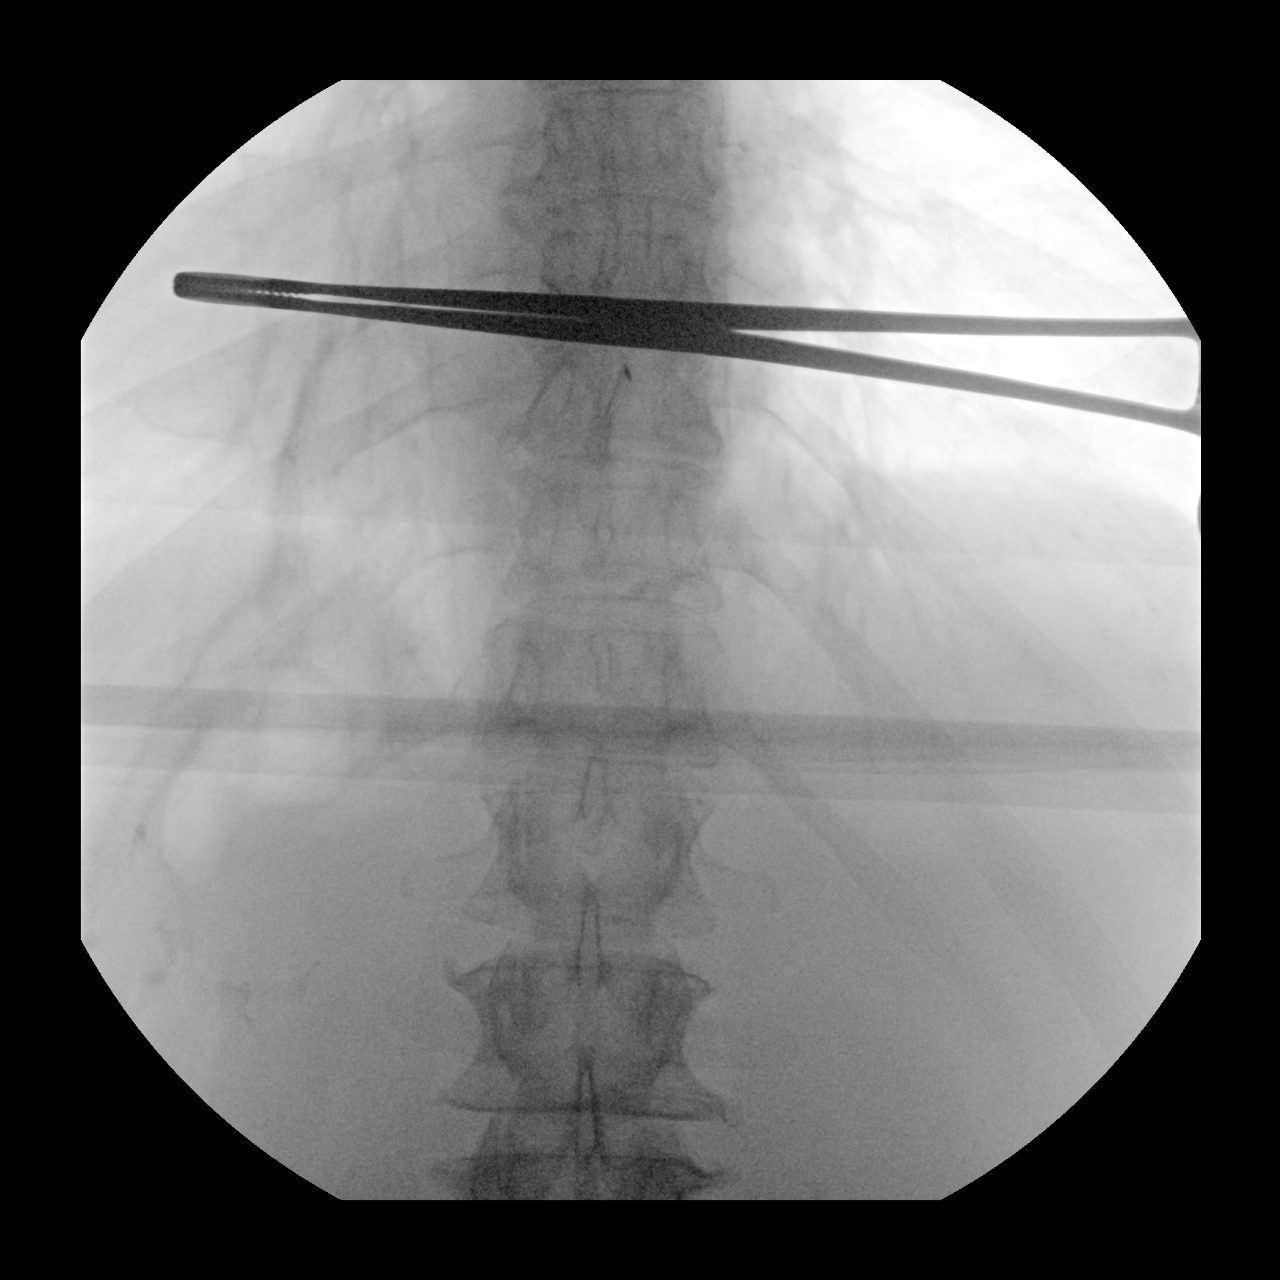
[im 2/2]
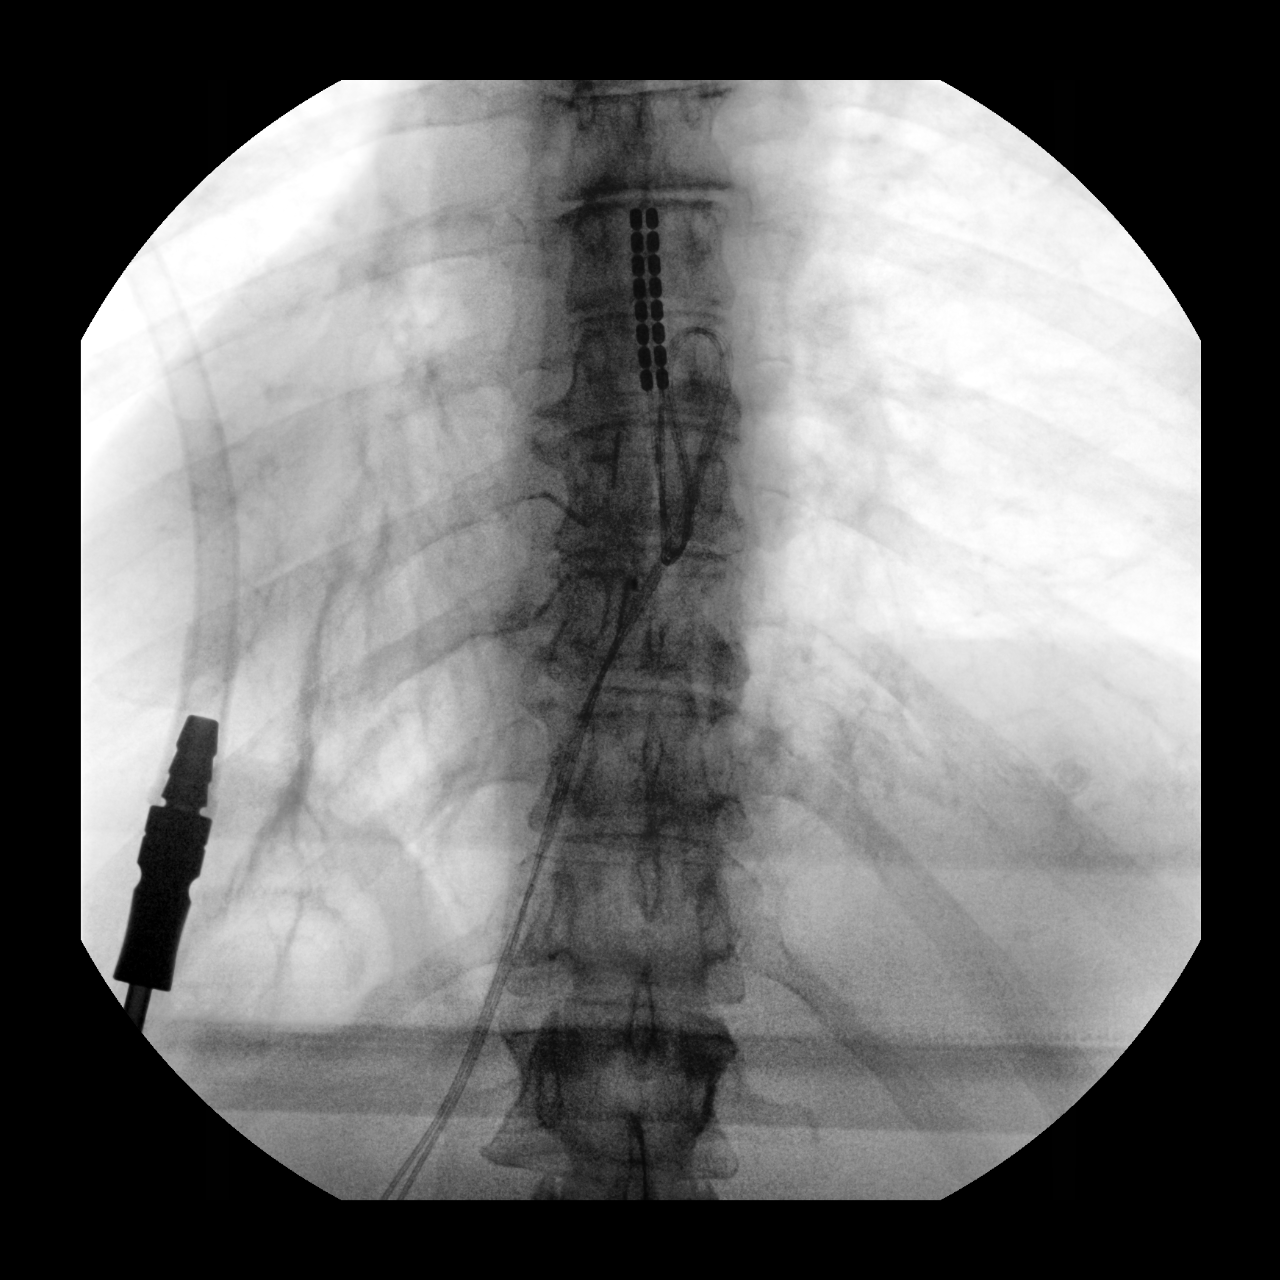

[2 of 2 positions shown; findings below may reference images not displayed]

FINDINGS: Intraoperative images demonstrate placement of spinal cord
stimulator with electrode spanning T7 and T8 vertebral bodies
assuming 12 thoracic ribs.
IMPRESSION: Intraoperative guidance.

## 2022-09-17 ENCOUNTER — Telehealth: Payer: Self-pay | Admitting: Neurosurgery

## 2022-09-17 NOTE — Telephone Encounter (Signed)
Making sure I have scheduled her correctly. She is requesting to see Dr.Yarbrough only (offered PA she said no). She had a SCS placed last year. She has been experiencing back pain similar to the pain before the SCS was placed. She has had the stimulator adjusted several times at Dr.Runyon's office but it's not helping. Injections are not helping either. She feels she needs a MRI of her lumbar and thoracic.  I scheduled her in a 15 min appt with Dr.Yarbrough, or should it be 30 mins?

## 2022-09-18 NOTE — Telephone Encounter (Signed)
Patient was contacted, she would rather wait and see Dr.Yarbrough only. She was added to the cancellation list.

## 2022-09-21 ENCOUNTER — Inpatient Hospital Stay
Admission: RE | Admit: 2022-09-21 | Discharge: 2022-09-21 | Disposition: A | Discharge status: Home / Self Care | Payer: Self-pay | Source: Ambulatory Visit | Attending: Neurosurgery | Admitting: Neurosurgery

## 2022-09-21 ENCOUNTER — Other Ambulatory Visit: Payer: Self-pay

## 2022-09-21 DIAGNOSIS — Z049 Encounter for examination and observation for unspecified reason: Secondary | ICD-10-CM

## 2022-10-08 ENCOUNTER — Encounter: Payer: Self-pay | Admitting: Neurosurgery

## 2022-10-08 ENCOUNTER — Ambulatory Visit (INDEPENDENT_AMBULATORY_CARE_PROVIDER_SITE_OTHER): Payer: BC Managed Care – PPO | Admitting: Neurosurgery

## 2022-10-08 VITALS — BP 134/84 | Ht 63.0 in | Wt 137.4 lb

## 2022-10-08 DIAGNOSIS — G8929 Other chronic pain: Secondary | ICD-10-CM

## 2022-10-08 DIAGNOSIS — M25551 Pain in right hip: Secondary | ICD-10-CM

## 2022-10-08 DIAGNOSIS — M5441 Lumbago with sciatica, right side: Secondary | ICD-10-CM

## 2022-10-08 DIAGNOSIS — M546 Pain in thoracic spine: Secondary | ICD-10-CM

## 2022-10-08 NOTE — Progress Notes (Signed)
Referring Physician:  No referring provider defined for this encounter.  Primary Physician:  Megan Sell, MD  DOS: 09/03/21 placement of SCS   History of Present Illness: 10/08/2022 [name] is here today with a chief complaint of continued back and right buttock pain.  She has a long history of this condition, and underwent spinal cord stimulator placement in May 2023.  Her trial lead gave her substantial pain relief in her recollection, but the permanent implant has not been as effective.  She now has pain in 2 locations primarily, though there are additional locations that bother her.  Her primary issues are on the right side near the same level of her battery implantation in the paraspinous region as well as in her right buttock particularly when she is changing position or insert in different positions.  She is continent worse to the point where she sleeps in a sitting position due to the severe pain that occurs when she changes positions from laying down to standing.  She does not have clear radicular pain at this point.  She is doing some aquatic and physical therapy.  She has some concern that her lead implantation is not at same location as her trial leads.   Bowel/Bladder Dysfunction: none  Conservative measures:  Physical therapy: doing aquatic/PT therapy right now Multimodal medical therapy including regular antiinflammatories: see MAR  Injections: has had multiple epidural steroid injections  Past Surgery: see above  Megan [name] has no symptoms of cervical myelopathy.  The symptoms are causing a significant impact on the patient's life.   I have utilized the care everywhere function in epic to review the outside records available from external health systems.  Review of Systems:  A 10 point review of systems is negative, except for the pertinent positives and negatives detailed in the HPI.  Past Medical History: Past Medical History:  Diagnosis  Date   Anemia    Arthritis    Asthma    DDD (degenerative disc disease), lumbosacral    DVT (deep venous thrombosis) (HCC) 2008   left femeral s/p femeral osteotomy   Dyspnea    Facet syndrome, lumbar    Family history of adverse reaction to anesthesia    mother had reaction but doesn't know what it is   GERD (gastroesophageal reflux disease)    Heart murmur    History of kidney stones    Hypertension    Hypothyroidism    Lumbar foraminal stenosis    Medullary sponge kidney    Pneumonia    Polycystic ovary    PONV (postoperative nausea and vomiting)    Pre-diabetes    Renal disorder    Thyroid disease    Trigeminal neuralgia     Past Surgical History: Past Surgical History:  Procedure Laterality Date   ABDOMINAL HYSTERECTOMY     APPENDECTOMY  1980   CYSTOSCOPY/URETEROSCOPY/HOLMIUM LASER/STENT PLACEMENT Right 06/29/2019   Procedure: CYSTOSCOPY/URETEROSCOPY/HOLMIUM LASER/STENT PLACEMENT;  Surgeon: Vanna Scotland, MD;  Location: [facility];  Service: Urology;  Laterality: Right;   CYSTOSCOPY/URETEROSCOPY/HOLMIUM LASER/STENT PLACEMENT  07/15/2020   CYSTOSCOPY/URETEROSCOPY/HOLMIUM LASER/STENT PLACEMENT Bilateral 12/01/2021   Procedure: CYSTOSCOPY/URETEROSCOPY/HOLMIUM LASER/STENT PLACEMENT;  Surgeon: Vanna Scotland, MD;  Location: [facility];  Service: Urology;  Laterality: Bilateral;   ESOPHAGOGASTRODUODENOSCOPY     2021, 08/16/2020, 11/13/2020   FEMORAL OSTEOTOMY W/ APPLICATION EXTERNAL FIXATOR Right    KNEE ARTHROSCOPY W/ ACL RECONSTRUCTION Left    KNEE ARTHROSCOPY W/ HARDWARE REMOVAL Right 01/13/2017   LAPAROSCOPIC GASTRIC BYPASS  02/12/2020   LITHOTRIPSY     LUMBAR LAMINECTOMY/DECOMPRESSION MICRODISCECTOMY Right 01/22/2021   Procedure: L4-5 DECOMPRESSION;  Surgeon: Venetia Night, MD;  Location: [facility];  Service: Neurosurgery;  Laterality: Right;   OSTEOTOMY Bilateral 2017   femoral shaft/supracondylar   PULSE GENERATOR IMPLANT N/A 09/03/2021   Procedure: PULSE  GENERATOR IMPLANT;  Surgeon: Venetia Night, MD;  Location: [facility];  Service: Neurosurgery;  Laterality: N/A;   ROTATOR CUFF REPAIR Right    THORACIC LAMINECTOMY FOR SPINAL CORD STIMULATOR N/A 09/03/2021   Procedure: THORACIC LAMINECTOMY FOR SPINAL CORD STIMULATOR PLACEMENT ([employer]);  Surgeon: Venetia Night, MD;  Location: [facility];  Service: Neurosurgery;  Laterality: N/A;   TONSILLECTOMY  1964   TOTAL KNEE ARTHROPLASTY Right 11/11/2018    Allergies: Allergies as of 10/08/2022 - Review Complete 10/08/2022  Allergen Reaction Noted   Hydrocodone-acetaminophen Shortness Of Breath 10/17/2015   Latex Rash and Other (See Comments) 08/06/2015   Morphine Anaphylaxis, Shortness Of Breath, Nausea And Vomiting, Swelling, and Other (See Comments) 03/28/2012   Nickel Swelling and Rash 10/17/2015   Petrolatum Rash and Other (See Comments) 11/04/2015   Tape Rash 08/06/2015   Erythromycin Nausea And Vomiting 03/28/2012   Amoxicillin-pot clavulanate Diarrhea 06/09/2022   Hydromorphone hcl Nausea And Vomiting 01/17/2019   Nsaids  01/09/2021   Other Other (See Comments) 11/04/2015   Sulfa antibiotics Rash 03/28/2012    Medications:  Current Outpatient Medications:    acetaminophen (TYLENOL) 325 MG tablet, Take 2 tablets (650 mg total) by mouth every 4 (four) hours as needed for mild pain ((score 1 to 3) or temp > 100.5)., Disp: 30 tablet, Rfl: 0   budesonide (PULMICORT) 0.5 MG/2ML nebulizer solution, Take 0.5 mg by nebulization daily as needed (shortness of breath)., Disp: , Rfl:    carbamazepine (TEGRETOL) 200 MG tablet, Take 200 mg by mouth at bedtime. And PRN, Disp: , Rfl:    Cholecalciferol (VITAMIN D3) 75 MCG (3000 UT) TABS, Take 3,000 Units by mouth daily., Disp: , Rfl:    fluticasone (FLONASE) 50 MCG/ACT nasal spray, Place 2 sprays into both nostrils daily., Disp: , Rfl:    gabapentin (NEURONTIN) 300 MG capsule, Take 600 mg by mouth at bedtime. And PRN, Disp: , Rfl:     levalbuterol (XOPENEX HFA) 45 MCG/ACT inhaler, Inhale 2 puffs into the lungs every 4 (four) hours as needed for wheezing., Disp: , Rfl:    levothyroxine (SYNTHROID) 50 MCG tablet, Take 50 mcg by mouth daily before breakfast., Disp: , Rfl:    losartan (COZAAR) 50 MG tablet, Take 75 mg by mouth at bedtime., Disp: , Rfl:    montelukast (SINGULAIR) 10 MG tablet, Take 10 mg by mouth at bedtime., Disp: , Rfl:    Multiple Vitamin (MULTIVITAMIN ADULT PO), Take 1 tablet by mouth daily., Disp: , Rfl:    tiZANidine (ZANAFLEX) 2 MG tablet, TAKE 1-2 TABLETS (2-4 MG TOTAL) BY MOUTH EVERY 8 (EIGHT) HOURS AS NEEDED FOR MUSCLE SPASMS., Disp: 360 tablet, Rfl: 0   traMADol (ULTRAM) 50 MG tablet, Take 1 tablet (50 mg total) by mouth every 6 (six) hours as needed., Disp: 30 tablet, Rfl: 0   TRELEGY ELLIPTA 200-62.5-25 MCG/ACT AEPB, Inhale 1 puff into the lungs daily., Disp: , Rfl:   Social History: Social History   Tobacco Use   Smoking status: Never   Smokeless tobacco: Never  Vaping Use   Vaping Use: Never used  Substance Use Topics   Alcohol use: Not Currently   Drug use: Never  Family Medical History: No family history on file.  Physical Examination: Vitals:   10/08/22 1449  BP: 134/84    General: Patient is in no apparent distress. Attention to examination is appropriate.  Neck:   Supple.  Full range of motion.  Respiratory: Patient is breathing without any difficulty.   NEUROLOGICAL:     Awake, alert, oriented to person, place, and time.  Speech is clear and fluent.   Cranial Nerves: Pupils equal round and reactive to light.  Facial tone is symmetric.  Facial sensation is symmetric. Shoulder shrug is symmetric. Tongue protrusion is midline.  There is no pronator drift.  Strength: Side Biceps Triceps Deltoid Interossei Grip Wrist Ext. Wrist Flex.  R 5 5 5 5 5 5 5   L 5 5 5 5 5 5 5    Side Iliopsoas Quads Hamstring PF DF EHL  R 5 5 5 5 5 5   L 5 5 5 5 5 5    Reflexes are 1+ and  symmetric at the biceps, triceps, brachioradialis, patella and achilles.   Hoffman's is absent.   Bilateral upper and lower extremity sensation is intact to light touch.    No evidence of dysmetria noted.  Gait is normal.    No tenderness to palpation around the sacroiliac joint.   Medical Decision Making  Imaging: She has no recent spine imaging.  I reviewed her x-rays from her implant placement.  Her paddle lead is at the top of T7 extending to the mid T8 vertebral body.  Her trial leads were between T8 and T10.  I did look at her CT abdomen pelvis from July of last year.  She does have some spondylotic changes in the L4-5 disc space.  I have personally reviewed the images and agree with the above interpretation.  Assessment and Plan: [name] is a pleasant 62 y.o. female with ongoing severe low back pain without clear radicular pain.  She notes significant pain in her right hip as well.  She has had extensive evaluation in the past, but not had an MRI of her right hip.  I recommended right hip MRI as well as lumbar spine MRI scan.  She is also having some thoracic back pain, so I recommended MRI scan of the thoracic spine.  I have discussed her case with Dr. Mattie Marlin at her request.  Her trial leads were approximately T8 and T10 and she did have some stimulation at T8.  The current lead is at T7 and T8, so should be readily provide some coverage but not full coverage to the area of the stimulator during her trial.  As such, we discussed possibility of revision of her lead to a lower position.  I think this is reasonable, but would like to see the results of her MRI scans before making a final determination.  I will see her after her MRI scans are performed.    Thank you for involving me in the care of this patient.      Megan Pellow K. Myer Haff MD, [facility] Neurosurgery

## 2022-10-29 ENCOUNTER — Ambulatory Visit (HOSPITAL_COMMUNITY)
Admission: RE | Admit: 2022-10-29 | Discharge: 2022-10-29 | Disposition: A | Discharge status: Home / Self Care | Payer: BC Managed Care – PPO | Source: Ambulatory Visit | Attending: Neurosurgery | Admitting: Neurosurgery

## 2022-10-29 DIAGNOSIS — M5441 Lumbago with sciatica, right side: Secondary | ICD-10-CM | POA: Insufficient documentation

## 2022-10-29 DIAGNOSIS — M546 Pain in thoracic spine: Secondary | ICD-10-CM | POA: Diagnosis present

## 2022-10-29 DIAGNOSIS — G8929 Other chronic pain: Secondary | ICD-10-CM | POA: Insufficient documentation

## 2022-11-02 ENCOUNTER — Ambulatory Visit (HOSPITAL_COMMUNITY)
Admission: RE | Admit: 2022-11-02 | Discharge: 2022-11-02 | Disposition: A | Discharge status: Home / Self Care | Payer: BC Managed Care – PPO | Source: Ambulatory Visit | Attending: Neurosurgery | Admitting: Neurosurgery

## 2022-11-02 DIAGNOSIS — M5441 Lumbago with sciatica, right side: Secondary | ICD-10-CM | POA: Insufficient documentation

## 2022-11-02 DIAGNOSIS — M546 Pain in thoracic spine: Secondary | ICD-10-CM | POA: Diagnosis present

## 2022-11-02 DIAGNOSIS — G8929 Other chronic pain: Secondary | ICD-10-CM | POA: Diagnosis present

## 2022-11-02 NOTE — Progress Notes (Signed)
MRI thoracic spine completed 11/02/22. Total scan time was 17:46, maximum scan time per device manufacturer is 30 minutes. Patient completed exam with no reported problems, there were no complications, some scan parameters adjusted to stay under the 3.2uT B1+RMS limit set by manufacturer.

## 2022-11-04 ENCOUNTER — Encounter: Payer: Self-pay | Admitting: Neurosurgery

## 2022-11-10 ENCOUNTER — Encounter: Payer: Self-pay | Admitting: Neurosurgery

## 2022-11-11 ENCOUNTER — Telehealth: Payer: Self-pay | Admitting: Orthopedic Surgery

## 2022-11-11 NOTE — Telephone Encounter (Signed)
Noted. I have resent clinicals to the fax number below.

## 2022-11-11 NOTE — Telephone Encounter (Signed)
Tried to do peer to peer for a Right Hip MRI. Patient has had PT, injections, medications, etc and yet they are still denying this.   Phone# [phone]   Case# [id number]    Told under her plan that peer to peer is not an open. They can do a provider courtesy review. Spoke with Sherrine Maples and they had no clinical documentation.   I will do a letter of medical necessity. Please add this to the medical records you sent previously and fax to [phone] Attn: Appeals by end of day tomorrow.   They will review and let us know.

## 2022-11-12 ENCOUNTER — Ambulatory Visit (INDEPENDENT_AMBULATORY_CARE_PROVIDER_SITE_OTHER): Payer: BC Managed Care – PPO | Admitting: Neurosurgery

## 2022-11-12 ENCOUNTER — Other Ambulatory Visit: Payer: Self-pay

## 2022-11-12 ENCOUNTER — Encounter: Payer: Self-pay | Admitting: Neurosurgery

## 2022-11-12 VITALS — BP 140/82 | Ht 63.0 in | Wt 137.0 lb

## 2022-11-12 DIAGNOSIS — T85192S Other mechanical complication of implanted electronic neurostimulator (electrode) of spinal cord, sequela: Secondary | ICD-10-CM

## 2022-11-12 DIAGNOSIS — Z01818 Encounter for other preprocedural examination: Secondary | ICD-10-CM

## 2022-11-12 DIAGNOSIS — M25551 Pain in right hip: Secondary | ICD-10-CM

## 2022-11-12 DIAGNOSIS — T85192A Other mechanical complication of implanted electronic neurostimulator (electrode) of spinal cord, initial encounter: Secondary | ICD-10-CM

## 2022-11-12 NOTE — Patient Instructions (Signed)
Please see below for information in regards to your upcoming surgery:   Planned surgery: Revision of spinal cord stimulator with laminoplasty plates ([employer])   Surgery date: 12/02/22 at [facility] - you will find out your arrival time the business day before your surgery.   Pre-op appointment at [facility] Pre-admit Testing: we will call you with a date/time for this. Pre-admit testing is located on the first floor of the [facility] building, [address] [facility] [address]. Please bring all prescriptions in the original prescription bottles to your appointment, even if you have reviewed medications by phone with a pharmacy representative. During this appointment, they will advise you which medications you can take the morning of surgery, and which medications you will need to hold for surgery. Pre-op labs may be done at your pre-op appointment. You are not required to fast for these labs. Should you need to change your pre-op appointment, please call Pre-admit testing at [phone].       Common restrictions after surgery: No bending, lifting, or twisting ("BLT"). Avoid lifting objects heavier than 10 pounds for the first 6 weeks after surgery. Where possible, avoid household activities that involve lifting, bending, reaching, pushing, or pulling such as laundry, vacuuming, grocery shopping, and childcare. Try to arrange for help from friends and family for these activities while you heal. Do not drive while taking prescription pain medication. Weeks 6 through 12 after surgery: avoid lifting more than 25 pounds.     How to contact us:  If you have any questions/concerns before or after surgery, you can reach [place] at [phone], or you can send a mychart message. We can be reached by phone or mychart 8am-4pm, Monday-Friday.  *Please note: Calls after 4pm are forwarded to a third party answering service. Mychart messages are not routinely monitored  during evenings, weekends, and holidays. Please call our office to contact the answering service for urgent concerns during non-business hours.    If you have FMLA/disability paperwork, please drop it off or fax it to [phone], attention Patty.   Appointments/FMLA & disability paperwork: Patty & Cristin  Nurse: Royston Cowper  Medical assistants: Laurann Montana, & Lyla Son Physician Assistants: Manning Charity & Drake Leach Surgeons: Venetia Night, MD & Ernestine Mcmurray, MD

## 2022-11-12 NOTE — Progress Notes (Addendum)
Referring Physician:  No referring provider defined for this encounter.  Primary Physician:  Mick Sell, MD  DOS: 09/03/21 placement of SCS   History of Present Illness: 11/12/2022 Megan [name] returns to see me with continued right buttock and hip area pain.  She is here to discuss revision of her spinal cord stimulator as it is nonfunctional currently.  10/08/2022 Megan [name] is here today with a chief complaint of continued back and right buttock pain.  She has a long history of this condition, and underwent spinal cord stimulator placement in May 2023.  Her trial lead gave her substantial pain relief in her recollection, but the permanent implant has not been as effective.  She now has pain in 2 locations primarily, though there are additional locations that bother her.  Her primary issues are on the right side near the same level of her battery implantation in the paraspinous region as well as in her right buttock particularly when she is changing position or insert in different positions.  She is continent worse to the point where she sleeps in a sitting position due to the severe pain that occurs when she changes positions from laying down to standing.  She does not have clear radicular pain at this point.  She is doing some aquatic and physical therapy.  She has some concern that her lead implantation is not at same location as her trial leads.   Bowel/Bladder Dysfunction: none  Conservative measures:  Physical therapy: doing aquatic/PT therapy right now Multimodal medical therapy including regular antiinflammatories: see MAR  Injections: has had multiple epidural steroid injections  Past Surgery: see above  Megan [name] has no symptoms of cervical myelopathy.  The symptoms are causing a significant impact on the patient's life.   I have utilized the care everywhere function in epic to review the outside records available from external health  systems.  Review of Systems:  A 10 point review of systems is negative, except for the pertinent positives and negatives detailed in the HPI.  Past Medical History: Past Medical History:  Diagnosis Date   Anemia    Arthritis    Asthma    DDD (degenerative disc disease), lumbosacral    DVT (deep venous thrombosis) (HCC) 2008   left femeral s/p femeral osteotomy   Dyspnea    Facet syndrome, lumbar    Family history of adverse reaction to anesthesia    mother had reaction but doesn't know what it is   GERD (gastroesophageal reflux disease)    Heart murmur    History of kidney stones    Hypertension    Hypothyroidism    Lumbar foraminal stenosis    Medullary sponge kidney    Pneumonia    Polycystic ovary    PONV (postoperative nausea and vomiting)    Pre-diabetes    Renal disorder    Thyroid disease    Trigeminal neuralgia     Past Surgical History: Past Surgical History:  Procedure Laterality Date   ABDOMINAL HYSTERECTOMY     APPENDECTOMY  1980   CYSTOSCOPY/URETEROSCOPY/HOLMIUM LASER/STENT PLACEMENT Right 06/29/2019   Procedure: CYSTOSCOPY/URETEROSCOPY/HOLMIUM LASER/STENT PLACEMENT;  Surgeon: Vanna Scotland, MD;  Location: [facility];  Service: Urology;  Laterality: Right;   CYSTOSCOPY/URETEROSCOPY/HOLMIUM LASER/STENT PLACEMENT  07/15/2020   CYSTOSCOPY/URETEROSCOPY/HOLMIUM LASER/STENT PLACEMENT Bilateral 12/01/2021   Procedure: CYSTOSCOPY/URETEROSCOPY/HOLMIUM LASER/STENT PLACEMENT;  Surgeon: Vanna Scotland, MD;  Location: [facility];  Service: Urology;  Laterality: Bilateral;   ESOPHAGOGASTRODUODENOSCOPY     2021, 08/16/2020, 11/13/2020  FEMORAL OSTEOTOMY W/ APPLICATION EXTERNAL FIXATOR Right    KNEE ARTHROSCOPY W/ ACL RECONSTRUCTION Left    KNEE ARTHROSCOPY W/ HARDWARE REMOVAL Right 01/13/2017   LAPAROSCOPIC GASTRIC BYPASS  02/12/2020   LITHOTRIPSY     LUMBAR LAMINECTOMY/DECOMPRESSION MICRODISCECTOMY Right 01/22/2021   Procedure: L4-5 DECOMPRESSION;  Surgeon: Venetia Night, MD;  Location: [facility];  Service: Neurosurgery;  Laterality: Right;   OSTEOTOMY Bilateral 2017   femoral shaft/supracondylar   PULSE GENERATOR IMPLANT N/A 09/03/2021   Procedure: PULSE GENERATOR IMPLANT;  Surgeon: Venetia Night, MD;  Location: [facility];  Service: Neurosurgery;  Laterality: N/A;   ROTATOR CUFF REPAIR Right    THORACIC LAMINECTOMY FOR SPINAL CORD STIMULATOR N/A 09/03/2021   Procedure: THORACIC LAMINECTOMY FOR SPINAL CORD STIMULATOR PLACEMENT ([employer]);  Surgeon: Venetia Night, MD;  Location: [facility];  Service: Neurosurgery;  Laterality: N/A;   TONSILLECTOMY  1964   TOTAL KNEE ARTHROPLASTY Right 11/11/2018    Allergies: Allergies as of 11/12/2022 - Review Complete 11/12/2022  Allergen Reaction Noted   Hydrocodone-acetaminophen Shortness Of Breath 10/17/2015   Latex Rash and Other (See Comments) 08/06/2015   Morphine Anaphylaxis, Shortness Of Breath, Nausea And Vomiting, Swelling, and Other (See Comments) 03/28/2012   Nickel Swelling and Rash 10/17/2015   Petrolatum Rash and Other (See Comments) 11/04/2015   Tape Rash 08/06/2015   Erythromycin Nausea And Vomiting 03/28/2012   Amoxicillin-pot clavulanate Diarrhea 06/09/2022   Hydromorphone hcl Nausea And Vomiting 01/17/2019   Nsaids  01/09/2021   Other Other (See Comments) 11/04/2015   Sulfa antibiotics Rash 03/28/2012    Medications:  Current Outpatient Medications:    acetaminophen (TYLENOL) 325 MG tablet, Take 2 tablets (650 mg total) by mouth every 4 (four) hours as needed for mild pain ((score 1 to 3) or temp > 100.5)., Disp: 30 tablet, Rfl: 0   budesonide (PULMICORT) 0.5 MG/2ML nebulizer solution, Take 0.5 mg by nebulization daily as needed (shortness of breath)., Disp: , Rfl:    carbamazepine (TEGRETOL) 200 MG tablet, Take 200 mg by mouth at bedtime. And PRN, Disp: , Rfl:    Cholecalciferol (VITAMIN D3) 75 MCG (3000 UT) TABS, Take 3,000 Units by mouth daily., Disp: , Rfl:     fluticasone (FLONASE) 50 MCG/ACT nasal spray, Place 2 sprays into both nostrils daily., Disp: , Rfl:    gabapentin (NEURONTIN) 300 MG capsule, Take 600 mg by mouth at bedtime. And PRN, Disp: , Rfl:    levalbuterol (XOPENEX HFA) 45 MCG/ACT inhaler, Inhale 2 puffs into the lungs every 4 (four) hours as needed for wheezing., Disp: , Rfl:    levothyroxine (SYNTHROID) 50 MCG tablet, Take 50 mcg by mouth daily before breakfast., Disp: , Rfl:    losartan (COZAAR) 50 MG tablet, Take 75 mg by mouth at bedtime., Disp: , Rfl:    montelukast (SINGULAIR) 10 MG tablet, Take 10 mg by mouth at bedtime., Disp: , Rfl:    Multiple Vitamin (MULTIVITAMIN ADULT PO), Take 1 tablet by mouth daily., Disp: , Rfl:    tiZANidine (ZANAFLEX) 2 MG tablet, TAKE 1-2 TABLETS (2-4 MG TOTAL) BY MOUTH EVERY 8 (EIGHT) HOURS AS NEEDED FOR MUSCLE SPASMS., Disp: 360 tablet, Rfl: 0   traMADol (ULTRAM) 50 MG tablet, Take 1 tablet (50 mg total) by mouth every 6 (six) hours as needed., Disp: 30 tablet, Rfl: 0   TRELEGY ELLIPTA 200-62.5-25 MCG/ACT AEPB, Inhale 1 puff into the lungs daily., Disp: , Rfl:   Social History: Social History   Tobacco Use   Smoking status:  Never   Smokeless tobacco: Never  Vaping Use   Vaping status: Never Used  Substance Use Topics   Alcohol use: Not Currently   Drug use: Never    Family Medical History: No family history on file.  Physical Examination: Vitals:   11/12/22 0833  BP: (!) 140/82     General: Patient is in no apparent distress. Attention to examination is appropriate.  Neck:   Supple.  Full range of motion.  Respiratory: Patient is breathing without any difficulty.   NEUROLOGICAL:     Awake, alert, oriented to person, place, and time.  Speech is clear and fluent.   Cranial Nerves: Pupils equal round and reactive to light.  Facial tone is symmetric.  Facial sensation is symmetric. Shoulder shrug is symmetric. Tongue protrusion is midline.  There is no pronator  drift.  Strength: Side Biceps Triceps Deltoid Interossei Grip Wrist Ext. Wrist Flex.  R 5 5 5 5 5 5 5   L 5 5 5 5 5 5 5    Side Iliopsoas Quads Hamstring PF DF EHL  R 5 5 5 5 5 5   L 5 5 5 5 5 5    Reflexes are 1+ and symmetric at the biceps, triceps, brachioradialis, patella and achilles.   Hoffman's is absent.   Bilateral upper and lower extremity sensation is intact to light touch.    No evidence of dysmetria noted.  Gait is normal.    R hip shows pain with active range of movement.  +Faber on R.   Medical Decision Making  Imaging: MRI L spine 10/29/2022 and MRI T spine 11/02/2022 IMPRESSION: MR THORACIC SPINE IMPRESSION   No complicating features of spinal cord stimulator placement. Degenerative changes are stable from 2023 and described above. The spinal canal is diffusely patent.   MR LUMBAR SPINE IMPRESSION   Lumbar spine degeneration at L2-3 and below with mild L4-5 anterolisthesis. There has been some progression of disc degeneration at L4-5 where there is chronic moderate right foraminal stenosis. Otherwise little change from 2022.     Electronically Signed   By: Tiburcio Pea M.D.   On: 11/06/2022 17:40   I have personally reviewed the images and agree with the above interpretation.  Assessment and Plan: Megan [name] is a pleasant 62 y.o. female with ongoing severe low back pain without clear radicular pain.  I discussed revising her stimulator with her pain doctor.  We will move it down 1 level to T8.  It is currently nonfunctional.  During the surgery, I will try to move the T9 lamina and secured it to the T8 bone with laminoplasty plates to provide a bony structure to secure the lead.  She is having significant pain in her right hip.  She has an x-ray from 3 years ago that shows evidence of osteoarthritis of the hip.  She has evidence of right hip pain on provocative testing today.  I think getting an MRI of the right hip is indicated.    I discussed the  planned procedure at length with the patient, including the risks, benefits, alternatives, and indications. The risks discussed include but are not limited to bleeding, infection, need for reoperation, spinal fluid leak, stroke, vision loss, anesthetic complication, coma, paralysis, and even death. I also described in detail that improvement was not guaranteed.  The patient expressed understanding of these risks, and asked that we proceed with surgery. I described the surgery in layman's terms, and gave ample opportunity for questions, which were answered to the  best of my ability.    Thank you for involving me in the care of this patient.      Greg Eckrich K. Myer Haff MD, [facility] Neurosurgery

## 2022-11-12 NOTE — H&P (View-Only) (Signed)
 Referring Physician:  No referring provider defined for this encounter.  Primary Physician:  Mick Sell, MD  DOS: 09/03/21 placement of SCS   History of Present Illness: 11/12/2022 [name] returns to see me with continued right buttock and hip area pain.  She is here to discuss revision of her spinal cord stimulator as it is nonfunctional currently.  10/08/2022 [name] is here today with a chief complaint of continued back and right buttock pain.  She has a long history of this condition, and underwent spinal cord stimulator placement in May 2023.  Her trial lead gave her substantial pain relief in her recollection, but the permanent implant has not been as effective.  She now has pain in 2 locations primarily, though there are additional locations that bother her.  Her primary issues are on the right side near the same level of her battery implantation in the paraspinous region as well as in her right buttock particularly when she is changing position or insert in different positions.  She is continent worse to the point where she sleeps in a sitting position due to the severe pain that occurs when she changes positions from laying down to standing.  She does not have clear radicular pain at this point.  She is doing some aquatic and physical therapy.  She has some concern that her lead implantation is not at same location as her trial leads.   Bowel/Bladder Dysfunction: none  Conservative measures:  Physical therapy: doing aquatic/PT therapy right now Multimodal medical therapy including regular antiinflammatories: see MAR  Injections: has had multiple epidural steroid injections  Past Surgery: see above  Megan [name] has no symptoms of cervical myelopathy.  The symptoms are causing a significant impact on the patient's life.   I have utilized the care everywhere function in epic to review the outside records available from external health  systems.  Review of Systems:  A 10 point review of systems is negative, except for the pertinent positives and negatives detailed in the HPI.  Past Medical History: Past Medical History:  Diagnosis Date   Anemia    Arthritis    Asthma    DDD (degenerative disc disease), lumbosacral    DVT (deep venous thrombosis) (HCC) 2008   left femeral s/p femeral osteotomy   Dyspnea    Facet syndrome, lumbar    Family history of adverse reaction to anesthesia    mother had reaction but doesn't know what it is   GERD (gastroesophageal reflux disease)    Heart murmur    History of kidney stones    Hypertension    Hypothyroidism    Lumbar foraminal stenosis    Medullary sponge kidney    Pneumonia    Polycystic ovary    PONV (postoperative nausea and vomiting)    Pre-diabetes    Renal disorder    Thyroid disease    Trigeminal neuralgia     Past Surgical History: Past Surgical History:  Procedure Laterality Date   ABDOMINAL HYSTERECTOMY     APPENDECTOMY  1980   CYSTOSCOPY/URETEROSCOPY/HOLMIUM LASER/STENT PLACEMENT Right 06/29/2019   Procedure: CYSTOSCOPY/URETEROSCOPY/HOLMIUM LASER/STENT PLACEMENT;  Surgeon: Vanna Scotland, MD;  Location: [facility];  Service: Urology;  Laterality: Right;   CYSTOSCOPY/URETEROSCOPY/HOLMIUM LASER/STENT PLACEMENT  07/15/2020   CYSTOSCOPY/URETEROSCOPY/HOLMIUM LASER/STENT PLACEMENT Bilateral 12/01/2021   Procedure: CYSTOSCOPY/URETEROSCOPY/HOLMIUM LASER/STENT PLACEMENT;  Surgeon: Vanna Scotland, MD;  Location: [facility];  Service: Urology;  Laterality: Bilateral;   ESOPHAGOGASTRODUODENOSCOPY     2021, 08/16/2020, 11/13/2020  FEMORAL OSTEOTOMY W/ APPLICATION EXTERNAL FIXATOR Right    KNEE ARTHROSCOPY W/ ACL RECONSTRUCTION Left    KNEE ARTHROSCOPY W/ HARDWARE REMOVAL Right 01/13/2017   LAPAROSCOPIC GASTRIC BYPASS  02/12/2020   LITHOTRIPSY     LUMBAR LAMINECTOMY/DECOMPRESSION MICRODISCECTOMY Right 01/22/2021   Procedure: L4-5 DECOMPRESSION;  Surgeon: Venetia Night, MD;  Location: [facility];  Service: Neurosurgery;  Laterality: Right;   OSTEOTOMY Bilateral 2017   femoral shaft/supracondylar   PULSE GENERATOR IMPLANT N/A 09/03/2021   Procedure: PULSE GENERATOR IMPLANT;  Surgeon: Venetia Night, MD;  Location: [facility];  Service: Neurosurgery;  Laterality: N/A;   ROTATOR CUFF REPAIR Right    THORACIC LAMINECTOMY FOR SPINAL CORD STIMULATOR N/A 09/03/2021   Procedure: THORACIC LAMINECTOMY FOR SPINAL CORD STIMULATOR PLACEMENT ([employer]);  Surgeon: Venetia Night, MD;  Location: [facility];  Service: Neurosurgery;  Laterality: N/A;   TONSILLECTOMY  1964   TOTAL KNEE ARTHROPLASTY Right 11/11/2018    Allergies: Allergies as of 11/12/2022 - Review Complete 11/12/2022  Allergen Reaction Noted   Hydrocodone-acetaminophen Shortness Of Breath 10/17/2015   Latex Rash and Other (See Comments) 08/06/2015   Morphine Anaphylaxis, Shortness Of Breath, Nausea And Vomiting, Swelling, and Other (See Comments) 03/28/2012   Nickel Swelling and Rash 10/17/2015   Petrolatum Rash and Other (See Comments) 11/04/2015   Tape Rash 08/06/2015   Erythromycin Nausea And Vomiting 03/28/2012   Amoxicillin-pot clavulanate Diarrhea 06/09/2022   Hydromorphone hcl Nausea And Vomiting 01/17/2019   Nsaids  01/09/2021   Other Other (See Comments) 11/04/2015   Sulfa antibiotics Rash 03/28/2012    Medications:  Current Outpatient Medications:    acetaminophen (TYLENOL) 325 MG tablet, Take 2 tablets (650 mg total) by mouth every 4 (four) hours as needed for mild pain ((score 1 to 3) or temp > 100.5)., Disp: 30 tablet, Rfl: 0   budesonide (PULMICORT) 0.5 MG/2ML nebulizer solution, Take 0.5 mg by nebulization daily as needed (shortness of breath)., Disp: , Rfl:    carbamazepine (TEGRETOL) 200 MG tablet, Take 200 mg by mouth at bedtime. And PRN, Disp: , Rfl:    Cholecalciferol (VITAMIN D3) 75 MCG (3000 UT) TABS, Take 3,000 Units by mouth daily., Disp: , Rfl:     fluticasone (FLONASE) 50 MCG/ACT nasal spray, Place 2 sprays into both nostrils daily., Disp: , Rfl:    gabapentin (NEURONTIN) 300 MG capsule, Take 600 mg by mouth at bedtime. And PRN, Disp: , Rfl:    levalbuterol (XOPENEX HFA) 45 MCG/ACT inhaler, Inhale 2 puffs into the lungs every 4 (four) hours as needed for wheezing., Disp: , Rfl:    levothyroxine (SYNTHROID) 50 MCG tablet, Take 50 mcg by mouth daily before breakfast., Disp: , Rfl:    losartan (COZAAR) 50 MG tablet, Take 75 mg by mouth at bedtime., Disp: , Rfl:    montelukast (SINGULAIR) 10 MG tablet, Take 10 mg by mouth at bedtime., Disp: , Rfl:    Multiple Vitamin (MULTIVITAMIN ADULT PO), Take 1 tablet by mouth daily., Disp: , Rfl:    tiZANidine (ZANAFLEX) 2 MG tablet, TAKE 1-2 TABLETS (2-4 MG TOTAL) BY MOUTH EVERY 8 (EIGHT) HOURS AS NEEDED FOR MUSCLE SPASMS., Disp: 360 tablet, Rfl: 0   traMADol (ULTRAM) 50 MG tablet, Take 1 tablet (50 mg total) by mouth every 6 (six) hours as needed., Disp: 30 tablet, Rfl: 0   TRELEGY ELLIPTA 200-62.5-25 MCG/ACT AEPB, Inhale 1 puff into the lungs daily., Disp: , Rfl:   Social History: Social History   Tobacco Use   Smoking status:  Never   Smokeless tobacco: Never  Vaping Use   Vaping status: Never Used  Substance Use Topics   Alcohol use: Not Currently   Drug use: Never    Family Medical History: No family history on file.  Physical Examination: Vitals:   11/12/22 0833  BP: (!) 140/82     General: Patient is in no apparent distress. Attention to examination is appropriate.  Neck:   Supple.  Full range of motion.  Respiratory: Patient is breathing without any difficulty.   NEUROLOGICAL:     Awake, alert, oriented to person, place, and time.  Speech is clear and fluent.   Cranial Nerves: Pupils equal round and reactive to light.  Facial tone is symmetric.  Facial sensation is symmetric. Shoulder shrug is symmetric. Tongue protrusion is midline.  There is no pronator  drift.  Strength: Side Biceps Triceps Deltoid Interossei Grip Wrist Ext. Wrist Flex.  R 5 5 5 5 5 5 5   L 5 5 5 5 5 5 5    Side Iliopsoas Quads Hamstring PF DF EHL  R 5 5 5 5 5 5   L 5 5 5 5 5 5    Reflexes are 1+ and symmetric at the biceps, triceps, brachioradialis, patella and achilles.   Hoffman's is absent.   Bilateral upper and lower extremity sensation is intact to light touch.    No evidence of dysmetria noted.  Gait is normal.    R hip shows pain with active range of movement.  +Faber on R.   Medical Decision Making  Imaging: MRI L spine 10/29/2022 and MRI T spine 11/02/2022 IMPRESSION: MR THORACIC SPINE IMPRESSION   No complicating features of spinal cord stimulator placement. Degenerative changes are stable from 2023 and described above. The spinal canal is diffusely patent.   MR LUMBAR SPINE IMPRESSION   Lumbar spine degeneration at L2-3 and below with mild L4-5 anterolisthesis. There has been some progression of disc degeneration at L4-5 where there is chronic moderate right foraminal stenosis. Otherwise little change from 2022.     Electronically Signed   By: Tiburcio Pea M.D.   On: 11/06/2022 17:40   I have personally reviewed the images and agree with the above interpretation.  Assessment and Plan: Ms. Glaus is a pleasant 62 y.o. female with ongoing severe low back pain without clear radicular pain.  I discussed revising her stimulator with her pain doctor.  We will move it down 1 level to T8.  It is currently nonfunctional.  During the surgery, I will try to move the T9 lamina and secured it to the T8 bone with laminoplasty plates to provide a bony structure to secure the lead.  She is having significant pain in her right hip.  She has an x-ray from 3 years ago that shows evidence of osteoarthritis of the hip.  She has evidence of right hip pain on provocative testing today.  I think getting an MRI of the right hip is indicated.    I discussed the  planned procedure at length with the patient, including the risks, benefits, alternatives, and indications. The risks discussed include but are not limited to bleeding, infection, need for reoperation, spinal fluid leak, stroke, vision loss, anesthetic complication, coma, paralysis, and even death. I also described in detail that improvement was not guaranteed.  The patient expressed understanding of these risks, and asked that we proceed with surgery. I described the surgery in layman's terms, and gave ample opportunity for questions, which were answered to the  best of my ability.    Thank you for involving me in the care of this patient.      Chester K. Myer Haff MD, [facility] Neurosurgery

## 2022-11-12 NOTE — Addendum Note (Signed)
Addended by: Ernie Hew on: 11/12/2022 09:19 AM   Modules accepted: Orders

## 2022-11-20 ENCOUNTER — Other Ambulatory Visit: Payer: Self-pay

## 2022-11-20 ENCOUNTER — Other Ambulatory Visit
Admission: RE | Admit: 2022-11-20 | Discharge: 2022-11-20 | Disposition: A | Discharge status: Home / Self Care | Payer: BC Managed Care – PPO | Source: Ambulatory Visit | Attending: Neurosurgery | Admitting: Neurosurgery

## 2022-11-20 ENCOUNTER — Encounter: Payer: Self-pay | Admitting: Neurosurgery

## 2022-11-20 VITALS — BP 136/79 | Temp 97.2°F | Resp 18 | Ht 63.0 in | Wt 137.0 lb

## 2022-11-20 DIAGNOSIS — G8929 Other chronic pain: Secondary | ICD-10-CM | POA: Insufficient documentation

## 2022-11-20 DIAGNOSIS — Z01818 Encounter for other preprocedural examination: Secondary | ICD-10-CM | POA: Insufficient documentation

## 2022-11-20 DIAGNOSIS — M48061 Spinal stenosis, lumbar region without neurogenic claudication: Secondary | ICD-10-CM | POA: Insufficient documentation

## 2022-11-20 DIAGNOSIS — Z01812 Encounter for preprocedural laboratory examination: Secondary | ICD-10-CM

## 2022-11-20 DIAGNOSIS — M5441 Lumbago with sciatica, right side: Secondary | ICD-10-CM | POA: Insufficient documentation

## 2022-11-20 DIAGNOSIS — M5442 Lumbago with sciatica, left side: Secondary | ICD-10-CM | POA: Insufficient documentation

## 2022-11-20 DIAGNOSIS — I10 Essential (primary) hypertension: Secondary | ICD-10-CM | POA: Diagnosis not present

## 2022-11-20 DIAGNOSIS — M1711 Unilateral primary osteoarthritis, right knee: Secondary | ICD-10-CM | POA: Diagnosis not present

## 2022-11-20 HISTORY — DX: Unilateral primary osteoarthritis, left knee: M17.12

## 2022-11-20 HISTORY — DX: Presence of intraocular lens: Z96.1

## 2022-11-20 HISTORY — DX: Central serous chorioretinopathy, left eye: H35.712

## 2022-11-20 LAB — URINALYSIS, COMPLETE (UACMP) WITH MICROSCOPIC
Bilirubin Urine: NEGATIVE
Glucose, UA: NEGATIVE mg/dL
Hgb urine dipstick: NEGATIVE
Ketones, ur: NEGATIVE mg/dL
Leukocytes,Ua: NEGATIVE
Nitrite: NEGATIVE
Protein, ur: NEGATIVE mg/dL
Specific Gravity, Urine: 1.019 (ref 1.005–1.030)
pH: 6 (ref 5.0–8.0)

## 2022-11-20 LAB — SURGICAL PCR SCREEN
MRSA, PCR: NEGATIVE
Staphylococcus aureus: NEGATIVE

## 2022-11-20 NOTE — Patient Instructions (Addendum)
Your procedure is scheduled on: 12/02/2022 Wednesday  Report to the Registration Desk on the 1st floor of the [facility]. To find out your arrival time, please call [phone] between 1PM - 3PM on:   If your arrival time is 6:00 am, do not arrive before that time as the [facility] entrance doors do not open until 6:00 am. 8/13/ 2024   REMEMBER: Instructions that are not followed completely may result in serious medical risk, up to and including death; or upon the discretion of your surgeon and anesthesiologist your surgery may need to be rescheduled.  Do not eat food after midnight the night before surgery.  No gum chewing or hard candies.    One week prior to surgery: Stop Anti-inflammatories (NSAIDS) such as Advil, Aleve, Ibuprofen, Motrin, Naproxen, Naprosyn and Aspirin based products such as Excedrin, Goody's Powder, BC Powder. Stop ANY OVER THE COUNTER supplements until after surgery. You may however, continue to take Tylenol if needed for pain up until the day of surgery.  Continue taking all prescribed medications with the exception of the following:    Follow recommendations from Cardiologist or PCP regarding stopping blood thinners.  TAKE ONLY THESE MEDICATIONS THE MORNING OF SURGERY WITH A SIP OF WATER:  levothyroxine (SYNTHROID)  Pantroprazole (one at night before bedtime and one in the am )    No Alcohol for 24 hours before or after surgery.  No Smoking including e-cigarettes for 24 hours before surgery.  No chewable tobacco products for at least 6 hours before surgery.  No nicotine patches on the day of surgery.  Do not use any "recreational" drugs for at least a week (preferably 2 weeks) before your surgery.  Please be advised that the combination of cocaine and anesthesia may have negative outcomes, up to and including death. If you test positive for cocaine, your surgery will be cancelled.  On the morning of surgery brush your teeth with toothpaste and  water, you may rinse your mouth with mouthwash if you wish. Do not swallow any toothpaste or mouthwash.  Use CHG Soap or wipes as directed on instruction sheet.  Do not wear jewelry, make-up, hairpins, clips or nail polish.  Do not wear lotions, powders, or perfumes.   Do not shave body hair from the neck down 48 hours before surgery.  Contact lenses, hearing aids and dentures may not be worn into surgery.  Do not bring valuables to the hospital. [facility] is not responsible for any missing/lost belongings or valuables.    Notify your doctor if there is any change in your medical condition (cold, fever, infection).  Wear comfortable clothing (specific to your surgery type) to the hospital.  After surgery, you can help prevent lung complications by doing breathing exercises.  Take deep breaths and cough every 1-2 hours. Your doctor may order a device called an Incentive Spirometer to help you take deep breaths. When coughing or sneezing, hold a pillow firmly against your incision with both hands. This is called "splinting." Doing this helps protect your incision. It also decreases belly discomfort.  If you are being admitted to the hospital overnight, leave your suitcase in the car. After surgery it may be brought to your room.  In case of increased patient census, it may be necessary for you, the patient, to continue your postoperative care in the Same Day Surgery department.  If you are being discharged the day of surgery, you will not be allowed to drive home. You will need a  responsible individual to drive you home and stay with you for 24 hours after surgery.   Please call the Pre-admissions Testing Dept. at [phone] if you have any questions about these instructions.  Surgery Visitation Policy:  Patients having surgery or a procedure may have two visitors.  Children under the age of 41 must have an adult with them who is not the patient.  Inpatient Visitation:     Visiting hours are 7 a.m. to 8 p.m. Up to four visitors are allowed at one time in a patient room. The visitors may rotate out with other people during the day.  One visitor age 40 or older may stay with the patient overnight and must be in the room by 8 p.m.       Pre-operative 5 CHG Bath Instructions   You can play a key role in reducing the risk of infection after surgery. Your skin needs to be as free of germs as possible. You can reduce the number of germs on your skin by washing with CHG (chlorhexidine gluconate) soap before surgery. CHG is an antiseptic soap that kills germs and continues to kill germs even after washing.   DO NOT use if you have an allergy to chlorhexidine/CHG or antibacterial soaps. If your skin becomes reddened or irritated, stop using the CHG and notify one of our RNs at [phone].   Please shower with the CHG soap starting 4 days before surgery using the following schedule:     Please keep in mind the following:  DO NOT shave, including legs and underarms, starting the day of your first shower.   You may shave your face at any point before/day of surgery.  Place clean sheets on your bed the day you start using CHG soap. Use a clean washcloth (not used since being washed) for each shower. DO NOT sleep with pets once you start using the CHG.   CHG Shower Instructions:  If you choose to wash your hair and private area, wash first with your normal shampoo/soap.  After you use shampoo/soap, rinse your hair and body thoroughly to remove shampoo/soap residue.  Turn the water OFF and apply about 3 tablespoons (45 ml) of CHG soap to a CLEAN washcloth.  Apply CHG soap ONLY FROM YOUR NECK DOWN TO YOUR TOES (washing for 3-5 minutes)  DO NOT use CHG soap on face, private areas, open wounds, or sores.  Pay special attention to the area where your surgery is being performed.  If you are having back surgery, having someone wash your back for you may be  helpful. Wait 2 minutes after CHG soap is applied, then you may rinse off the CHG soap.  Pat dry with a clean towel  Put on clean clothes/pajamas   If you choose to wear lotion, please use ONLY the CHG-compatible lotions on the back of this paper.     Additional instructions for the day of surgery: DO NOT APPLY any lotions, deodorants, cologne, or perfumes.   Put on clean/comfortable clothes.  Brush your teeth.  Ask your nurse before applying any prescription medications to the skin.      CHG Compatible Lotions   Aveeno Moisturizing lotion  Cetaphil Moisturizing Cream  Cetaphil Moisturizing Lotion  Clairol Herbal Essence Moisturizing Lotion, Dry Skin  Clairol Herbal Essence Moisturizing Lotion, Extra Dry Skin  Clairol Herbal Essence Moisturizing Lotion, Normal Skin  Curel Age Defying Therapeutic Moisturizing Lotion with Alpha Hydroxy  Curel Extreme Care Body Lotion  Curel Soothing Hands  Moisturizing Hand Lotion  Curel Therapeutic Moisturizing Cream, Fragrance-Free  Curel Therapeutic Moisturizing Lotion, Fragrance-Free  Curel Therapeutic Moisturizing Lotion, Original Formula  Eucerin Daily Replenishing Lotion  Eucerin Dry Skin Therapy Plus Alpha Hydroxy Crme  Eucerin Dry Skin Therapy Plus Alpha Hydroxy Lotion  Eucerin Original Crme  Eucerin Original Lotion  Eucerin Plus Crme Eucerin Plus Lotion  Eucerin TriLipid Replenishing Lotion  Keri Anti-Bacterial Hand Lotion  Keri Deep Conditioning Original Lotion Dry Skin Formula Softly Scented  Keri Deep Conditioning Original Lotion, Fragrance Free Sensitive Skin Formula  Keri Lotion Fast Absorbing Fragrance Free Sensitive Skin Formula  Keri Lotion Fast Absorbing Softly Scented Dry Skin Formula  Keri Original Lotion  Keri Skin Renewal Lotion Keri Silky Smooth Lotion  Keri Silky Smooth Sensitive Skin Lotion  Nivea Body Creamy Conditioning Oil  Nivea Body Extra Enriched Teacher, adult education  Moisturizing Lotion Nivea Crme  Nivea Skin Firming Lotion  NutraDerm 30 Skin Lotion  NutraDerm Skin Lotion  NutraDerm Therapeutic Skin Cream  NutraDerm Therapeutic Skin Lotion  ProShield Protective Hand Cream  Provon moisturizing lotion

## 2022-11-28 ENCOUNTER — Other Ambulatory Visit: Payer: Self-pay | Admitting: Neurosurgery

## 2022-11-30 ENCOUNTER — Telehealth: Payer: Self-pay

## 2022-11-30 NOTE — Telephone Encounter (Signed)
Megan [name] is scheduled for surgery on 12/02/22. She called in to report that about when attempting to put the remote on top of the battery, 2/3 times it says "problem with connectivity". She reports that this has been going on since it was placed in 2023 and she has discussed this with the reps numerous times.  I informed her that I would discuss this with Dr Myer Haff.

## 2022-11-30 NOTE — Telephone Encounter (Addendum)
Per discussion with Dr Myer Haff, it is too short notice to add on revision of her battery...we can postpone surgery to allow time to do previously scheduled revision along with revision of her battery (to allow time for authorization of the battery), or we can schedule revision of her battery for another date.   I have discussed this with Megan [name]. She does not wish to postpone her surgery at this time and would prefer to keep things as scheduled for 12/02/22 and consider revision of her battery at a later date if needed.

## 2022-12-01 MED ORDER — CEFAZOLIN IN SODIUM CHLORIDE 2-0.9 GM/100ML-% IV SOLN
2.0000 g | Freq: Once | INTRAVENOUS | Status: DC
  Filled 2022-12-01: qty 100

## 2022-12-01 MED ORDER — ACETAMINOPHEN 500 MG PO TABS
1000.0000 mg | ORAL_TABLET | Freq: Once | ORAL | Status: AC
  Administered 2022-12-02: 1000 mg via ORAL

## 2022-12-01 MED ORDER — CHLORHEXIDINE GLUCONATE 0.12 % MT SOLN
15.0000 mL | Freq: Once | OROMUCOSAL | Status: AC
  Administered 2022-12-02: 15 mL via OROMUCOSAL

## 2022-12-01 MED ORDER — ORAL CARE MOUTH RINSE: 15.0000 mL | Freq: Once | OROMUCOSAL | Status: AC

## 2022-12-01 MED ORDER — LACTATED RINGERS IV SOLN: INTRAVENOUS | Status: DC

## 2022-12-01 MED ORDER — GABAPENTIN 300 MG PO CAPS
300.0000 mg | ORAL_CAPSULE | Freq: Once | ORAL | Status: AC
  Administered 2022-12-02: 300 mg via ORAL

## 2022-12-01 MED ORDER — CEFAZOLIN SODIUM-DEXTROSE 2-4 GM/100ML-% IV SOLN
2.0000 g | INTRAVENOUS | Status: AC
  Administered 2022-12-02: 2 g via INTRAVENOUS

## 2022-12-01 MED ORDER — VANCOMYCIN HCL IN DEXTROSE 1-5 GM/200ML-% IV SOLN
1000.0000 mg | Freq: Once | INTRAVENOUS | Status: AC
  Administered 2022-12-02: 1000 mg via INTRAVENOUS

## 2022-12-01 NOTE — Anesthesia Preprocedure Evaluation (Signed)
Anesthesia Evaluation  Patient identified by MRN, date of birth, ID band Patient awake    Reviewed: Allergy & Precautions, H&P , NPO status , Patient's Chart, lab work & pertinent test results  History of Anesthesia Complications (+) PONV and history of anesthetic complications  Airway Mallampati: II  TM Distance: >3 FB Neck ROM: full    Dental no notable dental hx.    Pulmonary asthma  Asthma -moderate-persistent-controlled    Pulmonary exam normal        Cardiovascular Exercise Tolerance: Good hypertension, Normal cardiovascular exam  >4 METS (Limited by chronic back pain.  ECHO 01/20/22 EF>55% INTERPRETATION NORMAL LEFT VENTRICULAR SYSTOLIC FUNCTION NORMAL RIGHT VENTRICULAR SYSTOLIC FUNCTION MILD VALVULAR REGURGITATION (See above) NO VALVULAR STENOSIS   EKG 11/22/19: Normal sinus rhythm  DVT (LLE DVT in 2016 treated with OAC at the time)   Neuro/Psych         Pain Chronic pain: : musculoskeletal, neuropathic Opioid tolerance  Neuropathy (trigeminal neuralgia managed with tegratol)  Spinal cord stimulator placed on 09/03/21-  Malfunction of spinal cord stimulator, sequela DDD s/p L4-L5 decompression on 01/2021  Neuromuscular disease    GI/Hepatic Neg liver ROS,GERD  ,,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   Endo/Other  Hypothyroidism    Renal/GU Renal InsufficiencyRenal disease     Musculoskeletal  (+) Arthritis  ((s/p R TKA)),    Abdominal Normal abdominal exam  (+)   Peds  Hematology negative hematology ROS (+)   Anesthesia Other Findings Past Medical History: No date: Anemia No date: Arthritis No date: Asthma No date: CSR (central serous retinopathy), left No date: DDD (degenerative disc disease), lumbosacral 2008: DVT (deep venous thrombosis) (HCC)     Comment:  left femeral s/p femeral osteotomy No date: Dyspnea No date: Facet syndrome, lumbar No date: Family history of  adverse reaction to anesthesia     Comment:  mother had reaction but doesn't know what it is No date: GERD (gastroesophageal reflux disease) No date: Heart murmur No date: History of kidney stones No date: Hypertension No date: Hypothyroidism No date: Lumbar foraminal stenosis No date: Malfunction of spinal cord stimulator, 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 No date: Pneumonia No date: Polycystic ovary No date: PONV (postoperative nausea and vomiting) No date: Primary osteoarthritis of left knee No date: Pseudophakia of both eyes No date: Renal disorder No date: Thyroid disease No date: Trigeminal neuralgia  Past Surgical History: No date: ABDOMINAL HYSTERECTOMY 1980: APPENDECTOMY 06/29/2019: CYSTOSCOPY/URETEROSCOPY/HOLMIUM LASER/STENT PLACEMENT;  Right     Comment:  Procedure: CYSTOSCOPY/URETEROSCOPY/HOLMIUM LASER/STENT               PLACEMENT;  Surgeon: Vanna Scotland, MD;  Location: [facility]              ORS;  Service: Urology;  Laterality: Right; 07/15/2020: CYSTOSCOPY/URETEROSCOPY/HOLMIUM LASER/STENT PLACEMENT 12/01/2021: CYSTOSCOPY/URETEROSCOPY/HOLMIUM LASER/STENT PLACEMENT;  Bilateral     Comment:  Procedure: CYSTOSCOPY/URETEROSCOPY/HOLMIUM LASER/STENT               PLACEMENT;  Surgeon: Vanna Scotland, MD;  Location: [facility]              ORS;  Service: Urology;  Laterality: Bilateral; No date: ESOPHAGOGASTRODUODENOSCOPY     Comment:  2021, 08/16/2020, 11/13/2020 No date: FEMORAL OSTEOTOMY W/ APPLICATION EXTERNAL FIXATOR; Right No date: KNEE ARTHROSCOPY W/ ACL RECONSTRUCTION; Left 01/13/2017: KNEE ARTHROSCOPY W/ HARDWARE REMOVAL; Right 02/12/2020: LAPAROSCOPIC GASTRIC BYPASS No date: LITHOTRIPSY 01/22/2021: LUMBAR LAMINECTOMY/DECOMPRESSION MICRODISCECTOMY; Right     Comment:  Procedure: L4-5 DECOMPRESSION;  Surgeon:  Venetia Night, MD;  Location: [facility] ORS;  Service: Neurosurgery;              Laterality: Right; 2017: OSTEOTOMY; Bilateral      Comment:  femoral shaft/supracondylar 09/03/2021: PULSE GENERATOR IMPLANT; N/A     Comment:  Procedure: PULSE GENERATOR IMPLANT;  Surgeon: Venetia Night, MD;  Location: [facility] ORS;  Service: Neurosurgery;              Laterality: N/A; No date: ROTATOR CUFF REPAIR; Right 09/03/2021: THORACIC LAMINECTOMY FOR SPINAL CORD STIMULATOR; N/A     Comment:  Procedure: THORACIC LAMINECTOMY FOR SPINAL CORD               STIMULATOR PLACEMENT ([employer]);  Surgeon:               Venetia Night, MD;  Location: [facility] ORS;  Service:               Neurosurgery;  Laterality: N/A; 1964: TONSILLECTOMY 11/11/2018: TOTAL KNEE ARTHROPLASTY; Right No date: total knee arthroplasty,     Reproductive/Obstetrics negative OB ROS                             Anesthesia Physical Anesthesia Plan  ASA: 2  Anesthesia Plan: General ETT   Post-op Pain Management: Tylenol PO (pre-op)*, Gabapentin PO (pre-op)* and Regional block*   Induction: Intravenous  PONV Risk Score and Plan: 2 and Ondansetron, Dexamethasone and Midazolam  Airway Management Planned: Oral ETT  Additional Equipment:   Intra-op Plan:   Post-operative Plan: Extubation in OR  Informed Consent: I have reviewed the patients History and Physical, chart, labs and discussed the procedure including the risks, benefits and alternatives for the proposed anesthesia with the patient or authorized representative who has indicated his/her understanding and acceptance.     Dental Advisory Given  Plan Discussed with: CRNA and Surgeon  Anesthesia Plan Comments:         Anesthesia Quick Evaluation

## 2022-12-02 ENCOUNTER — Encounter: Payer: Self-pay | Admitting: Neurosurgery

## 2022-12-02 ENCOUNTER — Observation Stay
Admission: RE | Admit: 2022-12-02 | Discharge: 2022-12-03 | Disposition: A | Discharge status: Home / Self Care | Payer: BC Managed Care – PPO | Attending: Neurosurgery | Admitting: Neurosurgery

## 2022-12-02 ENCOUNTER — Ambulatory Visit: Payer: BC Managed Care – PPO | Admitting: Anesthesiology

## 2022-12-02 ENCOUNTER — Ambulatory Visit: Payer: BC Managed Care – PPO

## 2022-12-02 ENCOUNTER — Other Ambulatory Visit: Payer: Self-pay

## 2022-12-02 ENCOUNTER — Ambulatory Visit: Payer: BC Managed Care – PPO | Admitting: Urgent Care

## 2022-12-02 ENCOUNTER — Encounter
Admission: RE | Disposition: A | Discharge status: Home / Self Care | Payer: Self-pay | Source: Home / Self Care | Attending: Neurosurgery

## 2022-12-02 DIAGNOSIS — E039 Hypothyroidism, unspecified: Secondary | ICD-10-CM | POA: Diagnosis not present

## 2022-12-02 DIAGNOSIS — T85192D Other mechanical complication of implanted electronic neurostimulator (electrode) of spinal cord, subsequent encounter: Secondary | ICD-10-CM | POA: Diagnosis not present

## 2022-12-02 DIAGNOSIS — Z86718 Personal history of other venous thrombosis and embolism: Secondary | ICD-10-CM | POA: Diagnosis not present

## 2022-12-02 DIAGNOSIS — Z01818 Encounter for other preprocedural examination: Secondary | ICD-10-CM

## 2022-12-02 DIAGNOSIS — I10 Essential (primary) hypertension: Secondary | ICD-10-CM | POA: Insufficient documentation

## 2022-12-02 DIAGNOSIS — Y828 Other medical devices associated with adverse incidents: Secondary | ICD-10-CM | POA: Diagnosis not present

## 2022-12-02 DIAGNOSIS — Z79899 Other long term (current) drug therapy: Secondary | ICD-10-CM | POA: Insufficient documentation

## 2022-12-02 DIAGNOSIS — T85192S Other mechanical complication of implanted electronic neurostimulator (electrode) of spinal cord, sequela: Principal | ICD-10-CM | POA: Insufficient documentation

## 2022-12-02 DIAGNOSIS — J45909 Unspecified asthma, uncomplicated: Secondary | ICD-10-CM | POA: Diagnosis not present

## 2022-12-02 DIAGNOSIS — Z96651 Presence of right artificial knee joint: Secondary | ICD-10-CM | POA: Diagnosis not present

## 2022-12-02 DIAGNOSIS — T85192A Other mechanical complication of implanted electronic neurostimulator (electrode) of spinal cord, initial encounter: Principal | ICD-10-CM | POA: Insufficient documentation

## 2022-12-02 DIAGNOSIS — Z9689 Presence of other specified functional implants: Principal | ICD-10-CM

## 2022-12-02 DIAGNOSIS — Z91040 Latex allergy status: Secondary | ICD-10-CM | POA: Insufficient documentation

## 2022-12-02 HISTORY — PX: LUMBAR SPINAL CORD SIMULATOR REVISION: SHX6811

## 2022-12-02 HISTORY — DX: Other mechanical complication of implanted electronic neurostimulator of spinal cord electrode (lead), sequela: T85.192S

## 2022-12-02 SURGERY — LUMBAR SPINAL CORD STIMULATOR REVISION
Anesthesia: General | Site: Back

## 2022-12-02 MED ORDER — SENNA 8.6 MG PO TABS
1.0000 | ORAL_TABLET | Freq: Two times a day (BID) | ORAL | Status: DC
  Administered 2022-12-02 – 2022-12-03 (×2): 8.6 mg via ORAL

## 2022-12-02 MED ORDER — FENTANYL CITRATE (PF) 100 MCG/2ML IJ SOLN
INTRAMUSCULAR | Status: AC
  Filled 2022-12-02: qty 2

## 2022-12-02 MED ORDER — ONDANSETRON HCL 4 MG/2ML IJ SOLN
INTRAMUSCULAR | Status: AC
  Filled 2022-12-02: qty 2

## 2022-12-02 MED ORDER — PHENOL 1.4 % MT LIQD: 1.0000 | OROMUCOSAL | Status: DC | PRN

## 2022-12-02 MED ORDER — VITAMIN D3 25 MCG (1000 UNIT) PO TABS
2000.0000 [IU] | ORAL_TABLET | Freq: Every day | ORAL | Status: DC
  Administered 2022-12-03: 2000 [IU] via ORAL
  Filled 2022-12-02: qty 2

## 2022-12-02 MED ORDER — DOCUSATE SODIUM 100 MG PO CAPS
100.0000 mg | ORAL_CAPSULE | Freq: Two times a day (BID) | ORAL | Status: DC
  Administered 2022-12-02 – 2022-12-03 (×2): 100 mg via ORAL

## 2022-12-02 MED ORDER — TIZANIDINE HCL 2 MG PO TABS
2.0000 mg | ORAL_TABLET | Freq: Three times a day (TID) | ORAL | Status: DC | PRN
  Administered 2022-12-02 – 2022-12-03 (×4): 4 mg via ORAL
  Filled 2022-12-02 (×7): qty 2

## 2022-12-02 MED ORDER — REMIFENTANIL HCL 1 MG IV SOLR
INTRAVENOUS | Status: AC
  Filled 2022-12-02: qty 1000

## 2022-12-02 MED ORDER — ENOXAPARIN SODIUM 40 MG/0.4ML IJ SOSY
40.0000 mg | PREFILLED_SYRINGE | INTRAMUSCULAR | Status: DC
  Administered 2022-12-03: 40 mg via SUBCUTANEOUS

## 2022-12-02 MED ORDER — SODIUM CHLORIDE 0.9% FLUSH: 3.0000 mL | INTRAVENOUS | Status: DC | PRN

## 2022-12-02 MED ORDER — MIDAZOLAM HCL 2 MG/2ML IJ SOLN
INTRAMUSCULAR | Status: AC
  Filled 2022-12-02: qty 2

## 2022-12-02 MED ORDER — GABAPENTIN 300 MG PO CAPS
ORAL_CAPSULE | ORAL | Status: AC
  Filled 2022-12-02: qty 1

## 2022-12-02 MED ORDER — BUPIVACAINE HCL (PF) 0.5 % IJ SOLN
INTRAMUSCULAR | Status: AC
  Filled 2022-12-02: qty 30

## 2022-12-02 MED ORDER — PROPOFOL 1000 MG/100ML IV EMUL
INTRAVENOUS | Status: AC
  Filled 2022-12-02: qty 100

## 2022-12-02 MED ORDER — ONDANSETRON HCL 4 MG PO TABS: 4.0000 mg | ORAL_TABLET | Freq: Four times a day (QID) | ORAL | Status: DC | PRN

## 2022-12-02 MED ORDER — LEVALBUTEROL HCL 0.63 MG/3ML IN NEBU: 0.6300 mg | INHALATION_SOLUTION | RESPIRATORY_TRACT | Status: DC | PRN

## 2022-12-02 MED ORDER — OXYCODONE HCL 5 MG/5ML PO SOLN: 5.0000 mg | Freq: Once | ORAL | Status: AC | PRN

## 2022-12-02 MED ORDER — UMECLIDINIUM BROMIDE 62.5 MCG/ACT IN AEPB
1.0000 | INHALATION_SPRAY | Freq: Every day | RESPIRATORY_TRACT | Status: DC
  Filled 2022-12-02: qty 7

## 2022-12-02 MED ORDER — SODIUM CHLORIDE FLUSH 0.9 % IV SOLN
INTRAVENOUS | Status: AC
  Filled 2022-12-02: qty 20

## 2022-12-02 MED ORDER — DEXAMETHASONE SODIUM PHOSPHATE 10 MG/ML IJ SOLN
INTRAMUSCULAR | Status: AC
  Filled 2022-12-02: qty 1

## 2022-12-02 MED ORDER — SUCCINYLCHOLINE CHLORIDE 200 MG/10ML IV SOSY
PREFILLED_SYRINGE | INTRAVENOUS | Status: AC
  Filled 2022-12-02: qty 10

## 2022-12-02 MED ORDER — MONTELUKAST SODIUM 10 MG PO TABS
10.0000 mg | ORAL_TABLET | Freq: Every day | ORAL | Status: DC
  Administered 2022-12-02: 10 mg via ORAL
  Filled 2022-12-02: qty 1

## 2022-12-02 MED ORDER — PANTOPRAZOLE SODIUM 40 MG PO TBEC
DELAYED_RELEASE_TABLET | ORAL | Status: AC
  Filled 2022-12-02: qty 1

## 2022-12-02 MED ORDER — ACETAMINOPHEN 325 MG PO TABS
650.0000 mg | ORAL_TABLET | ORAL | Status: DC | PRN
  Administered 2022-12-03: 650 mg via ORAL

## 2022-12-02 MED ORDER — LOSARTAN POTASSIUM 50 MG PO TABS: 100.0000 mg | ORAL_TABLET | Freq: Every day | ORAL | Status: DC

## 2022-12-02 MED ORDER — MAGNESIUM CITRATE PO SOLN: 1.0000 | Freq: Once | ORAL | Status: DC | PRN

## 2022-12-02 MED ORDER — OXYCODONE HCL 5 MG PO TABS: 5.0000 mg | ORAL_TABLET | ORAL | Status: DC | PRN

## 2022-12-02 MED ORDER — LABETALOL HCL 5 MG/ML IV SOLN
INTRAVENOUS | Status: AC
  Filled 2022-12-02: qty 4

## 2022-12-02 MED ORDER — BUDESONIDE 0.5 MG/2ML IN SUSP: 0.5000 mg | Freq: Every day | RESPIRATORY_TRACT | Status: DC | PRN

## 2022-12-02 MED ORDER — CEFAZOLIN SODIUM-DEXTROSE 2-4 GM/100ML-% IV SOLN
INTRAVENOUS | Status: AC
  Filled 2022-12-02: qty 100

## 2022-12-02 MED ORDER — FLUTICASONE PROPIONATE 50 MCG/ACT NA SUSP
2.0000 | Freq: Every day | NASAL | Status: DC
  Administered 2022-12-03: 2 via NASAL
  Filled 2022-12-02: qty 16

## 2022-12-02 MED ORDER — PANTOPRAZOLE SODIUM 40 MG PO TBEC
40.0000 mg | DELAYED_RELEASE_TABLET | Freq: Three times a day (TID) | ORAL | Status: DC
  Administered 2022-12-02 – 2022-12-03 (×2): 40 mg via ORAL

## 2022-12-02 MED ORDER — LIDOCAINE HCL (PF) 2 % IJ SOLN
INTRAMUSCULAR | Status: AC
  Filled 2022-12-02: qty 5

## 2022-12-02 MED ORDER — LIDOCAINE HCL (CARDIAC) PF 100 MG/5ML IV SOSY
PREFILLED_SYRINGE | INTRAVENOUS | Status: DC | PRN
  Administered 2022-12-02: 100 mg via INTRAVENOUS

## 2022-12-02 MED ORDER — POLYETHYLENE GLYCOL 3350 17 G PO PACK: 17.0000 g | PACK | Freq: Every day | ORAL | Status: DC | PRN

## 2022-12-02 MED ORDER — SODIUM CHLORIDE 0.9 % IV SOLN: INTRAVENOUS | Status: AC

## 2022-12-02 MED ORDER — DEXAMETHASONE SODIUM PHOSPHATE 10 MG/ML IJ SOLN
INTRAMUSCULAR | Status: DC | PRN
  Administered 2022-12-02: 10 mg via INTRAVENOUS

## 2022-12-02 MED ORDER — LEVOTHYROXINE SODIUM 50 MCG PO TABS
50.0000 ug | ORAL_TABLET | Freq: Every day | ORAL | Status: DC
  Administered 2022-12-03: 50 ug via ORAL
  Filled 2022-12-02: qty 1

## 2022-12-02 MED ORDER — SURGIFLO WITH THROMBIN (HEMOSTATIC MATRIX KIT) OPTIME
TOPICAL | Status: DC | PRN
  Administered 2022-12-02: 1 via TOPICAL

## 2022-12-02 MED ORDER — SODIUM CHLORIDE 0.9 % IV SOLN: 250.0000 mL | INTRAVENOUS | Status: DC

## 2022-12-02 MED ORDER — PROPOFOL 10 MG/ML IV BOLUS
INTRAVENOUS | Status: DC | PRN
Start: 2022-12-02 — End: 2022-12-02
  Administered 2022-12-02: 150 mg via INTRAVENOUS
  Administered 2022-12-02: 150 ug/kg/min via INTRAVENOUS

## 2022-12-02 MED ORDER — SODIUM CHLORIDE (PF) 0.9 % IJ SOLN
INTRAMUSCULAR | Status: DC | PRN
  Administered 2022-12-02: 60 mL via INTRAMUSCULAR

## 2022-12-02 MED ORDER — CARBAMAZEPINE 200 MG PO TABS
200.0000 mg | ORAL_TABLET | Freq: Every day | ORAL | Status: DC
  Administered 2022-12-02: 200 mg via ORAL
  Filled 2022-12-02: qty 1

## 2022-12-02 MED ORDER — BUPIVACAINE-EPINEPHRINE (PF) 0.5% -1:200000 IJ SOLN
INTRAMUSCULAR | Status: DC | PRN
  Administered 2022-12-02: 7 mL

## 2022-12-02 MED ORDER — BUPIVACAINE LIPOSOME 1.3 % IJ SUSP
INTRAMUSCULAR | Status: AC
  Filled 2022-12-02: qty 20

## 2022-12-02 MED ORDER — FENTANYL CITRATE (PF) 100 MCG/2ML IJ SOLN
INTRAMUSCULAR | Status: DC | PRN
  Administered 2022-12-02 (×2): 50 ug via INTRAVENOUS

## 2022-12-02 MED ORDER — MIDAZOLAM HCL 2 MG/2ML IJ SOLN
INTRAMUSCULAR | Status: DC | PRN
  Administered 2022-12-02: 2 mg via INTRAVENOUS

## 2022-12-02 MED ORDER — ONDANSETRON HCL 4 MG/2ML IJ SOLN
INTRAMUSCULAR | Status: DC | PRN
  Administered 2022-12-02: 4 mg via INTRAVENOUS

## 2022-12-02 MED ORDER — FLUTICASONE FUROATE-VILANTEROL 200-25 MCG/ACT IN AEPB
1.0000 | INHALATION_SPRAY | Freq: Every day | RESPIRATORY_TRACT | Status: DC
  Filled 2022-12-02: qty 28

## 2022-12-02 MED ORDER — CHLORHEXIDINE GLUCONATE 0.12 % MT SOLN
OROMUCOSAL | Status: AC
  Filled 2022-12-02: qty 15

## 2022-12-02 MED ORDER — ACETAMINOPHEN 500 MG PO TABS
ORAL_TABLET | ORAL | Status: AC
  Filled 2022-12-02: qty 2

## 2022-12-02 MED ORDER — SODIUM CHLORIDE 0.9% FLUSH
3.0000 mL | Freq: Two times a day (BID) | INTRAVENOUS | Status: DC
  Administered 2022-12-02 – 2022-12-03 (×2): 3 mL via INTRAVENOUS

## 2022-12-02 MED ORDER — ONDANSETRON HCL 4 MG/2ML IJ SOLN: 4.0000 mg | Freq: Four times a day (QID) | INTRAMUSCULAR | Status: DC | PRN

## 2022-12-02 MED ORDER — OXYCODONE HCL 5 MG PO TABS
10.0000 mg | ORAL_TABLET | ORAL | Status: DC | PRN
  Administered 2022-12-02 – 2022-12-03 (×5): 10 mg via ORAL

## 2022-12-02 MED ORDER — VANCOMYCIN HCL IN DEXTROSE 1-5 GM/200ML-% IV SOLN
INTRAVENOUS | Status: AC
  Filled 2022-12-02: qty 200

## 2022-12-02 MED ORDER — FENTANYL CITRATE (PF) 100 MCG/2ML IJ SOLN
25.0000 ug | INTRAMUSCULAR | Status: DC | PRN
  Administered 2022-12-02: 25 ug via INTRAVENOUS
  Administered 2022-12-02: 50 ug via INTRAVENOUS
  Administered 2022-12-02 (×3): 25 ug via INTRAVENOUS

## 2022-12-02 MED ORDER — SUCCINYLCHOLINE CHLORIDE 200 MG/10ML IV SOSY
PREFILLED_SYRINGE | INTRAVENOUS | Status: DC | PRN
  Administered 2022-12-02: 100 mg via INTRAVENOUS

## 2022-12-02 MED ORDER — MENTHOL 3 MG MT LOZG: 1.0000 | LOZENGE | OROMUCOSAL | Status: DC | PRN

## 2022-12-02 MED ORDER — BUPIVACAINE-EPINEPHRINE (PF) 0.5% -1:200000 IJ SOLN
INTRAMUSCULAR | Status: AC
  Filled 2022-12-02: qty 20

## 2022-12-02 MED ORDER — OXYCODONE HCL 5 MG PO TABS
ORAL_TABLET | ORAL | Status: AC
  Filled 2022-12-02: qty 1

## 2022-12-02 MED ORDER — OXYCODONE HCL 5 MG PO TABS
ORAL_TABLET | ORAL | Status: AC
  Filled 2022-12-02: qty 2

## 2022-12-02 MED ORDER — SODIUM CHLORIDE 0.9 % IV BOLUS
500.0000 mL | Freq: Once | INTRAVENOUS | Status: AC
  Administered 2022-12-02 (×2): 500 mL via INTRAVENOUS

## 2022-12-02 MED ORDER — BISACODYL 10 MG RE SUPP: 10.0000 mg | Freq: Every day | RECTAL | Status: DC | PRN

## 2022-12-02 MED ORDER — SENNA 8.6 MG PO TABS
ORAL_TABLET | ORAL | Status: AC
  Filled 2022-12-02: qty 1

## 2022-12-02 MED ORDER — REMIFENTANIL HCL 1 MG IV SOLR
INTRAVENOUS | Status: DC | PRN
  Administered 2022-12-02: .1 ug/kg/min via INTRAVENOUS

## 2022-12-02 MED ORDER — LABETALOL HCL 5 MG/ML IV SOLN
10.0000 mg | INTRAVENOUS | Status: DC | PRN
  Administered 2022-12-02: 5 mg via INTRAVENOUS
  Administered 2022-12-02: 10 mg via INTRAVENOUS

## 2022-12-02 MED ORDER — GABAPENTIN 300 MG PO CAPS
300.0000 mg | ORAL_CAPSULE | Freq: Three times a day (TID) | ORAL | Status: DC
  Administered 2022-12-02 – 2022-12-03 (×2): 300 mg via ORAL

## 2022-12-02 MED ORDER — DOCUSATE SODIUM 100 MG PO CAPS
ORAL_CAPSULE | ORAL | Status: AC
  Filled 2022-12-02: qty 1

## 2022-12-02 MED ORDER — DROPERIDOL 2.5 MG/ML IJ SOLN: 0.6250 mg | Freq: Once | INTRAMUSCULAR | Status: DC | PRN

## 2022-12-02 MED ORDER — SURGIPHOR WOUND IRRIGATION SYSTEM - OPTIME
TOPICAL | Status: DC | PRN
  Administered 2022-12-02: 450 mL via TOPICAL

## 2022-12-02 MED ORDER — 0.9 % SODIUM CHLORIDE (POUR BTL) OPTIME
TOPICAL | Status: DC | PRN
  Administered 2022-12-02: 500 mL

## 2022-12-02 MED ORDER — PHENYLEPHRINE HCL (PRESSORS) 10 MG/ML IV SOLN
INTRAVENOUS | Status: DC | PRN
  Administered 2022-12-02 (×5): 80 ug via INTRAVENOUS

## 2022-12-02 MED ORDER — PROMETHAZINE HCL 25 MG/ML IJ SOLN: 6.2500 mg | INTRAMUSCULAR | Status: DC | PRN

## 2022-12-02 MED ORDER — OXYCODONE HCL 5 MG PO TABS
5.0000 mg | ORAL_TABLET | Freq: Once | ORAL | Status: AC | PRN
  Administered 2022-12-02: 5 mg via ORAL

## 2022-12-02 SURGICAL SUPPLY — 45 items
ADH SKN CLS APL DERMABOND .7 (GAUZE/BANDAGES/DRESSINGS) ×2
AGENT HMST KT MTR STRL THRMB (HEMOSTASIS) ×1
BUR NEURO DRILL SOFT 3.0X3.8M (BURR) ×1 IMPLANT
DERMABOND ADVANCED .7 DNX12 (GAUZE/BANDAGES/DRESSINGS) ×2 IMPLANT
DRAPE C ARM PK CFD 31 SPINE (DRAPES) ×1 IMPLANT
DRAPE LAPAROTOMY 100X77 ABD (DRAPES) ×1 IMPLANT
DRSG OPSITE POSTOP 4X6 (GAUZE/BANDAGES/DRESSINGS) IMPLANT
DRSG OPSITE POSTOP 4X8 (GAUZE/BANDAGES/DRESSINGS) IMPLANT
ELECT REM PT RETURN 9FT ADLT (ELECTROSURGICAL) ×1
ELECTRODE REM PT RTRN 9FT ADLT (ELECTROSURGICAL) ×1 IMPLANT
FEE INTRAOP CADWELL SUPPLY NCS (MISCELLANEOUS) IMPLANT
FEE INTRAOP MONITOR IMPULS NCS (MISCELLANEOUS) IMPLANT
GLOVE BIOGEL PI IND STRL 6.5 (GLOVE) ×1 IMPLANT
GLOVE SURG SYN 6.5 ES PF (GLOVE) ×1 IMPLANT
GLOVE SURG SYN 6.5 PF PI (GLOVE) ×1 IMPLANT
GLOVE SURG SYN 8.5 E (GLOVE) ×3 IMPLANT
GLOVE SURG SYN 8.5 PF PI (GLOVE) ×3 IMPLANT
GOWN SRG LRG LVL 4 IMPRV REINF (GOWNS) ×1 IMPLANT
GOWN SRG XL LVL 3 NONREINFORCE (GOWNS) ×1 IMPLANT
GOWN STRL NON-REIN TWL XL LVL3 (GOWNS) ×1
GOWN STRL REIN LRG LVL4 (GOWNS) ×1
INTRAOP CADWELL SUPPLY FEE NCS (MISCELLANEOUS) ×1
INTRAOP DISP SUPPLY FEE NCS (MISCELLANEOUS) ×1
INTRAOP MONITOR FEE IMPULS NCS (MISCELLANEOUS) ×1
KIT SPINAL PRONEVIEW (KITS) ×1 IMPLANT
MANIFOLD NEPTUNE II (INSTRUMENTS) ×1 IMPLANT
MARKER SKIN DUAL TIP RULER LAB (MISCELLANEOUS) ×1 IMPLANT
NDL SAFETY ECLIP 18X1.5 (MISCELLANEOUS) ×1 IMPLANT
NS IRRIG 1000ML POUR BTL (IV SOLUTION) ×1 IMPLANT
NS IRRIG 500ML POUR BTL (IV SOLUTION) IMPLANT
PACK LAMINECTOMY ARMC (PACKS) ×1 IMPLANT
PLATE CANOPY INLINE HINGED TI (Plate) IMPLANT
SCREW SELF DRILL RELIEVE 4 (Screw) IMPLANT
SCREW SELF DRILL RELIEVE 6 (Screw) IMPLANT
SOLUTION IRRIG SURGIPHOR (IV SOLUTION) ×1 IMPLANT
STAPLER SKIN PROX 35W (STAPLE) ×1 IMPLANT
SURGIFLO W/THROMBIN 8M KIT (HEMOSTASIS) ×1 IMPLANT
SUT DVC VLOC 3-0 CL 6 P-12 (SUTURE) ×1 IMPLANT
SUT SILK 2 0SH CR/8 30 (SUTURE) ×1 IMPLANT
SUT VIC AB 0 CT1 18XCR BRD 8 (SUTURE) ×1 IMPLANT
SUT VIC AB 0 CT1 8-18 (SUTURE) ×2
SUT VIC AB 2-0 CT1 18 (SUTURE) ×1 IMPLANT
SYR 10ML LL (SYRINGE) ×1 IMPLANT
SYR 30ML LL (SYRINGE) ×2 IMPLANT
TRAP FLUID SMOKE EVACUATOR (MISCELLANEOUS) ×1 IMPLANT

## 2022-12-02 NOTE — Transfer of Care (Signed)
Immediate Anesthesia Transfer of Care Note  Patient: Megan [name]  Procedure(s) Performed: REVISION OF SPINAL CORD STIMULATOR ([employer]) (Back)  Patient Location: PACU  Anesthesia Type:General  Level of Consciousness: awake and drowsy  Airway & Oxygen Therapy: Patient Spontanous Breathing and Patient connected to face mask oxygen  Post-op Assessment: Report given to RN and Post -op Vital signs reviewed and stable  Post vital signs: Reviewed and stable  Last Vitals:  Vitals Value Taken Time  BP 148/86 12/02/22 0935  Temp 36.1 C 12/02/22 0935  Pulse 66 12/02/22 0940  Resp 13 12/02/22 0940  SpO2 100 % 12/02/22 0940  Vitals shown include unfiled device data.  Last Pain:  Vitals:   12/02/22 1610  TempSrc: Oral  PainSc: 6          Complications: No notable events documented.

## 2022-12-02 NOTE — Op Note (Signed)
Indications: the patient is a 62 yo female who was diagnosed with T85.192S - Malfunction of spinal cord stimulator, sequela.  We planned for lead revision   Findings: successful revision of spinal cord stimulator with placement at the top of T8  Preoperative Diagnosis: T85.192S - Malfunction of spinal cord stimulator, sequela  Postoperative Diagnosis: same     EBL: 50 ml IVF: see anesthesia record Drains: none Disposition: Extubated and Stable to PACU Complications: none   No foley catheter was placed.     Preoperative Note:    Risks of surgery discussed in clinic.   Operative Note:      The patient was then brought from the preoperative center with intravenous access established.  The patient underwent general anesthesia and endotracheal tube intubation, then was rotated on the [facility] table where all pressure points were appropriately padded.  The prior incision was identified.  The skin was then thoroughly cleansed.  Perioperative antibiotic prophylaxis was administered.  Sterile prep and drapes were then applied and a timeout was then observed.     Once this was complete, the thoracic incision was opened and extended inferiorly approximately 4 cm.  The previously placed lead was carefully identified and dissected free.  Using blunt dissection and bipolar electrocautery, the T9 and T10 posterior elements were exposed.  The superiormost element of the leading edge T11 was also exposed.  The area where the prior lead was secured was exposed carefully so as not to damage the previously placed paddle lead.  This was disconnected from the T10 spinous process.  The T10 lamina and spinous process were disconnected using high-speed drill and Horsley rib cutter.  This was handed off to the scrub.  The epidural plane was carefully developed and all scar tissue removed.  The paddle lead was then repositioned with the superior portion of the lead at the top of the T8 vertebral body.  This confirmed  with fluoroscopy.  The lead was secured to the T11 leading edge using a 2-0 silk suture.  At this point, the T10 lamina was separated from the spinous process.  Using laminoplasty plates, the J19 lamina was positioned and secured to the T9 pars.  Monitoring was checked after placement of this device as well as fluoroscopy.  No changes were noted.  At this point, the area was copiously irrigated and hemostasis achieved.  The wound was then closed in layers.  After the fascia was closed, a confirmatory x-ray was taken.  We then finalized her closure with 2-0 Vicryl as well as 3-0 Monocryl and Dermabond.  A sterile dressing was applied.  Monitoring was stable throughout.   Patient was then rotated back to the preoperative bed awakened from anesthesia and taken to recovery. All counts are correct in this case.   I performed the entire procedure with the assistance of Manning Charity PA as an Designer, television/film set. An assistant was required for this procedure due to the complexity.  The assistant provided assistance in tissue manipulation and suction, and was required for the successful and safe performance of the procedure. I performed the critical portions of the procedure.      Venetia Night MD

## 2022-12-02 NOTE — Anesthesia Postprocedure Evaluation (Signed)
Anesthesia Post Note  Patient: Megan [name]  Procedure(s) Performed: REVISION OF SPINAL CORD STIMULATOR ([employer]) (Back)  Patient location during evaluation: PACU Anesthesia Type: General Level of consciousness: awake and alert Pain management: pain level controlled Vital Signs Assessment: post-procedure vital signs reviewed and stable Respiratory status: spontaneous breathing, nonlabored ventilation and respiratory function stable Cardiovascular status: blood pressure returned to baseline and stable Postop Assessment: no apparent nausea or vomiting Anesthetic complications: no Comments: Labetolol administered for hypertension in addition to pain medication. Her systolics dropped below 90. Pt was asymptomatic. Other vital signs were stable. 1 L IVF was administered. Discharge criteria meet.    No notable events documented.   Last Vitals:  Vitals:   12/02/22 1321 12/02/22 1330  BP:    Pulse: (!) 57 (!) 58  Resp: 10 10  Temp:  (!) 36 C  SpO2: 98% 99%    Last Pain:  Vitals:   12/02/22 1330  TempSrc:   PainSc: Asleep                 Foye Deer

## 2022-12-02 NOTE — Interval H&P Note (Signed)
History and Physical Interval Note:  12/02/2022 6:52 AM  Megan [name]  has presented today for surgery, with the diagnosis of T85.192S - Malfunction of spinal cord stimulator, sequela.  The various methods of treatment have been discussed with the patient and family. After consideration of risks, benefits and other options for treatment, the patient has consented to  Procedure(s): REVISION OF SPINAL CORD STIMULATOR ([employer]) (N/A) as a surgical intervention.  The patient's history has been reviewed, patient examined, no change in status, stable for surgery.  I have reviewed the patient's chart and labs.  Questions were answered to the patient's satisfaction.    Heart sounds normal no MRG. Chest Clear to Auscultation Bilaterally.    

## 2022-12-02 NOTE — Anesthesia Procedure Notes (Signed)
Procedure Name: Intubation Date/Time: 12/02/2022 7:23 AM  Performed by: Katherine Basset, CRNAPre-anesthesia Checklist: Patient identified, Emergency Drugs available, Suction available and Patient being monitored Patient Re-evaluated:Patient Re-evaluated prior to induction Oxygen Delivery Method: Circle system utilized Preoxygenation: Pre-oxygenation with 100% oxygen Induction Type: IV induction Ventilation: Mask ventilation without difficulty Laryngoscope Size: McGraph, Mac and 3 Grade View: Grade I Tube type: Oral Tube size: 7.0 mm Number of attempts: 1 Airway Equipment and Method: Stylet and Oral airway Placement Confirmation: ETT inserted through vocal cords under direct vision, positive ETCO2 and breath sounds checked- equal and bilateral Secured at: 21 cm Tube secured with: Tape Dental Injury: Teeth and Oropharynx as per pre-operative assessment

## 2022-12-02 NOTE — Plan of Care (Signed)
Care plan reviewed.

## 2022-12-03 ENCOUNTER — Encounter: Payer: Self-pay | Admitting: Neurosurgery

## 2022-12-03 DIAGNOSIS — T85192S Other mechanical complication of implanted electronic neurostimulator (electrode) of spinal cord, sequela: Secondary | ICD-10-CM | POA: Diagnosis not present

## 2022-12-03 DIAGNOSIS — T85192A Other mechanical complication of implanted electronic neurostimulator (electrode) of spinal cord, initial encounter: Secondary | ICD-10-CM | POA: Diagnosis not present

## 2022-12-03 LAB — CREATININE, SERUM
Creatinine, Ser: 0.68 mg/dL (ref 0.44–1.00)
GFR, Estimated: >60 mL/min (ref >60)

## 2022-12-03 LAB — CBC
HCT: 30 % — ABNORMAL LOW (ref 36.0–46.0)
Hemoglobin: 9.7 g/dL — ABNORMAL LOW (ref 12.0–15.0)
MCH: 29.8 pg (ref 26.0–34.0)
MCHC: 32.3 g/dL (ref 30.0–36.0)
MCV: 92 fL (ref 80.0–100.0)
Platelets: 207 10*3/uL (ref 150–400)
RBC: 3.26 MIL/uL — ABNORMAL LOW (ref 3.87–5.11)
RDW: 14.6 % (ref 11.5–15.5)
WBC: 8.3 10*3/uL (ref 4.0–10.5)
nRBC: 0 % (ref 0.0–0.2)

## 2022-12-03 MED ORDER — DOCUSATE SODIUM 100 MG PO CAPS
ORAL_CAPSULE | ORAL | Status: AC
  Filled 2022-12-03: qty 1

## 2022-12-03 MED ORDER — ENOXAPARIN SODIUM 40 MG/0.4ML IJ SOSY
PREFILLED_SYRINGE | INTRAMUSCULAR | Status: AC
  Filled 2022-12-03: qty 0.4

## 2022-12-03 MED ORDER — ACETAMINOPHEN 325 MG PO TABS
ORAL_TABLET | ORAL | Status: AC
  Filled 2022-12-03: qty 2

## 2022-12-03 MED ORDER — OXYCODONE HCL 5 MG PO TABS
ORAL_TABLET | ORAL | Status: AC
  Filled 2022-12-03: qty 2

## 2022-12-03 MED ORDER — DIAZEPAM 5 MG PO TABS
ORAL_TABLET | ORAL | Status: AC
  Filled 2022-12-03: qty 1

## 2022-12-03 MED ORDER — SENNA 8.6 MG PO TABS: 1.0000 | ORAL_TABLET | Freq: Every day | ORAL | 0 refills | Status: DC | PRN

## 2022-12-03 MED ORDER — PANTOPRAZOLE SODIUM 40 MG PO TBEC
DELAYED_RELEASE_TABLET | ORAL | Status: AC
  Filled 2022-12-03: qty 1

## 2022-12-03 MED ORDER — VITAMIN D 25 MCG (1000 UNIT) PO TABS
ORAL_TABLET | ORAL | Status: AC
  Filled 2022-12-03: qty 2

## 2022-12-03 MED ORDER — GABAPENTIN 300 MG PO CAPS
ORAL_CAPSULE | ORAL | Status: AC
  Filled 2022-12-03: qty 1

## 2022-12-03 MED ORDER — DIAZEPAM 5 MG PO TABS: 2.5000 mg | ORAL_TABLET | Freq: Three times a day (TID) | ORAL | 0 refills | Status: AC | PRN

## 2022-12-03 MED ORDER — OXYCODONE HCL 5 MG PO TABS: 5.0000 mg | ORAL_TABLET | ORAL | 0 refills | Status: AC | PRN

## 2022-12-03 MED ORDER — SENNA 8.6 MG PO TABS
ORAL_TABLET | ORAL | Status: AC
  Filled 2022-12-03: qty 1

## 2022-12-03 MED ORDER — DIAZEPAM 5 MG PO TABS
2.5000 mg | ORAL_TABLET | Freq: Three times a day (TID) | ORAL | Status: DC | PRN
  Administered 2022-12-03: 2.5 mg via ORAL

## 2022-12-03 NOTE — Plan of Care (Signed)
  Problem: Activity: Goal: Ability to avoid complications of mobility impairment will improve Outcome: Progressing   Problem: Pain Management: Goal: Pain level will decrease Outcome: Progressing   

## 2022-12-03 NOTE — Discharge Summary (Signed)
Physician Discharge Summary  Patient ID: Megan [name] MRN: [id number] DOB/AGE: 05/17/1960 62 y.o.  Admit date: 12/02/2022 Discharge date: 12/03/2022  Admission Diagnoses: T85.192S - Malfunction of spinal cord stimulator, sequela.  We planned for lead revision  Discharge Diagnoses:  Principal Problem:   Spinal cord stimulator status Active Problems:   Malfunction of spinal cord stimulator [facility])   Discharged Condition: good  Hospital Course:  Megan [name] is 62 year old with a history of chronic pain status post revision of spinal cord stimulator lead.  Her intraoperative course was uncomplicated and she was admitted overnight for pain management.  She endorsed incisional pain postoperatively which was managed reasonably well with oxycodone, home tizanidine, and Valium.  She was discharged home on postop day 1 with medications for pain.  Consults: None  Significant Diagnostic Studies: none  Treatments: surgery: as above.  Definitely dictated operative report for further details  Discharge Exam: Blood pressure 127/66, pulse 96, temperature 99.4 F (37.4 C), temperature source Oral, resp. rate 18, height 5\' 3"  (1.6 m), weight 62.1 kg, SpO2 98%. AA Ox3 CNI   Strength:5/5 throughout bilateral lower extremities  Disposition: Discharge disposition: 01-Home or Self Care       Discharge Instructions     Incentive spirometry RT   Complete by: As directed       Allergies as of 12/03/2022       Reactions   Hydrocodone-acetaminophen Shortness Of Breath   Headache, Vomiting, nightmares. Pt is able to take acetaminophen.   Latex Rash, Other (See Comments)   Blisters    Morphine Anaphylaxis, Shortness Of Breath, Nausea And Vomiting, Swelling, Other (See Comments)   Has and facial swelling   Nickel Swelling, Rash   Petrolatum Rash, Other (See Comments)   Blister All petroleum products per patient, facial swelling   Tape Rash   Pt can use paper tape Blisters, tegaderm  okay Prefers paper tape   Erythromycin Nausea And Vomiting   Hydromorphone Hcl Nausea And Vomiting   Bad headache   Nsaids    Avoid due to bariatric surgery   Other Other (See Comments)   Generic medications per patient- redness and burning in gums   Sulfa Antibiotics Rash           Medication List     STOP taking these medications    traMADol 50 MG tablet Commonly known as: ULTRAM       TAKE these medications    acetaminophen 325 MG tablet Commonly known as: TYLENOL Take 2 tablets (650 mg total) by mouth every 4 (four) hours as needed for mild pain ((score 1 to 3) or temp > 100.5).   Biotin 5000 MCG Tabs Take 1 tablet by mouth daily.   budesonide 0.5 MG/2ML nebulizer solution Commonly known as: PULMICORT Take 0.5 mg by nebulization daily as needed (shortness of breath).   carbamazepine 200 MG tablet Commonly known as: TEGRETOL Take 200 mg by mouth at bedtime. And PRN   Cholecalciferol 25 MCG (1000 UT) tablet Take 2,000 Units by mouth daily.   diazepam 5 MG tablet Commonly known as: VALIUM Take 0.5 tablets (2.5 mg total) by mouth every 8 (eight) hours as needed for up to 3 days for muscle spasms.   fluticasone 50 MCG/ACT nasal spray Commonly known as: FLONASE Place 2 sprays into both nostrils daily.   gabapentin 300 MG capsule Commonly known as: NEURONTIN Take 900 mg by mouth at bedtime. And PRN   levalbuterol 45 MCG/ACT inhaler Commonly known as: XOPENEX HFA Inhale 2  puffs into the lungs every 4 (four) hours as needed for wheezing.   levothyroxine 50 MCG tablet Commonly known as: SYNTHROID Take 50 mcg by mouth daily before breakfast.   losartan 50 MG tablet Commonly known as: COZAAR Take 100 mg by mouth at bedtime.   montelukast 10 MG tablet Commonly known as: SINGULAIR Take 10 mg by mouth at bedtime.   MULTIVITAMIN ADULT PO Take 1 tablet by mouth daily.   oxyCODONE 5 MG immediate release tablet Commonly known as: Oxy IR/ROXICODONE Take  1-2 tablets (5-10 mg total) by mouth every 4 (four) hours as needed for up to 5 days for moderate pain or severe pain.   pantoprazole 40 MG tablet Commonly known as: PROTONIX Take 40 mg by mouth 3 (three) times daily.   senna 8.6 MG Tabs tablet Commonly known as: SENOKOT Take 1 tablet (8.6 mg total) by mouth daily as needed for mild constipation.   tiZANidine 2 MG tablet Commonly known as: ZANAFLEX TAKE 1-2 TABLETS (2-4 MG TOTAL) BY MOUTH EVERY 8 (EIGHT) HOURS AS NEEDED FOR MUSCLE SPASMS.   Trelegy Ellipta 200-62.5-25 MCG/ACT Aepb Generic drug: Fluticasone-Umeclidin-Vilant Inhale 1 puff into the lungs daily.         Signed: Susanne Borders 12/03/2022, 3:45 PM

## 2022-12-03 NOTE — Discharge Instructions (Signed)
NEUROSURGERY DISCHARGE INSTRUCTIONS  Admission diagnosis: Spinal cord stimulator status [Z96.89]  Operative procedure: SCS revision  What to do after you leave the hospital:  Recommended diet: regular diet. Increase protein intake to promote wound healing.  Recommended activity: no lifting, driving, or strenuous exercise for 4 weeks .You should walk multiple times per day  Special Instructions  No straining, no heavy lifting > 10lbs x 4 weeks.  Keep incision area clean and dry. May shower in 2 days. No baths or pools for 6 weeks.  Please remove dressing tomorrow, no need to apply a bandage afterwards  You have no sutures to remove, the skin is closed with adhesive  Please take pain medications as directed. Take a stool softener if on pain medications   Please Report any of the following: Nausea or Vomiting, Temperature is greater than 101.66F (38.1C) degrees, Dizziness, Abdominal Pain, Difficulty Breathing or Shortness of Breath, Inability to Eat, drink Fluids, or Take medications, Bleeding, swelling, or drainage from surgical incision sites, New numbness or weakness, and Bowel or bladder dysfunction to the neurosurgeon on call. How to contact us:  If you have any questions/concerns before or after surgery, you can reach [place] at [phone], or you can send a mychart message. We can be reached by phone or mychart 8am-4pm, Monday-Friday.  *Please note: Calls after 4pm are forwarded to a third party answering service. Mychart messages are not routinely monitored during evenings, weekends, and holidays. Please call our office to contact the answering service for urgent concerns during non-business hours.   Additional Follow up appointments Please follow up with Drake Leach as scheduled in 2-3 weeks   Please see below for scheduled appointments:  Future Appointments  Date Time Provider Department Center  12/14/2022 10:30 AM Drake Leach, PA-C [facility] None  01/12/2023  4:00 PM Vanna Scotland, MD [facility] None  01/14/2023 10:45 AM Venetia Night, MD [facility] None

## 2022-12-03 NOTE — Plan of Care (Signed)
[place]

## 2022-12-03 NOTE — Progress Notes (Signed)
   Neurosurgery Progress Note  History: Megan [name] is s/p revision of spinal cord stimulator  POD1: Expected incisional back pain overnight  Physical Exam: Vitals:   12/03/22 0411 12/03/22 0759  BP: 111/64 111/65  Pulse: 96 89  Resp:  18  Temp: 98.3 F (36.8 C) 98.1 F (36.7 C)  SpO2: 98% 99%    AA Ox3 CNI  Strength:5/5 throughout bilateral lower extremities  Data:  Other tests/results: none   Assessment/Plan:  Megan [name] is a 62 year old presenting with failure to improve from her spinal cord stimulator placement in 2023.  She underwent revision of her paddle with placement at the top of T8 on 12/02/2022.  - mobilize - pain control; I have added Valium to take as needed for muscle spasms refractory to home tizanidine - DVT prophylaxis -Will reevaluate her progress after medication changes and likely discharge home this afternoon.  Manning Charity PA-C Department of Neurosurgery

## 2022-12-03 NOTE — Progress Notes (Signed)
DISCHARGE NOTE:  Pt given discharge instructions and verbalized understanding. Pt wheeled to car staff, husband providing transportation.

## 2022-12-06 ENCOUNTER — Encounter: Payer: Self-pay | Admitting: Neurosurgery

## 2022-12-06 NOTE — Progress Notes (Signed)
Received a phone call from patient today. She has had surgery on Wednesday for a revision spinal cord stimulator.  She states that ever since she returned home, she was having intractable, nausea, vomiting, diarrhea. She also states that she has been developing a fever.  She was recommended by the nurse line to present to the emergency department. She requested direct conversation with the surgical team.   Given her presentation and postoperative fever, along with inability to maintain any PO intake we instructed that our recommendation would be to present to the emergency department for evaluation. She was hesitant given her previous long wait in the emergency room on a prior occasion, and asked if we could call anything in. I let her know that it would be difficult to do so without evaluating her to see what the root cause of her problem was.

## 2022-12-07 ENCOUNTER — Telehealth: Payer: Self-pay | Admitting: Neurosurgery

## 2022-12-07 NOTE — Telephone Encounter (Addendum)
I spoke with Megan [name].  She reports uncontrollable diarrhea since Friday, where she cannot even make it to the bathroom in time. She also has a headache and nausea, but no vomiting. Her temperature has ranged from 99.4-101.7. She has been drinking water and Gatorade as much as possible, but does not have an appetite. She has been taking imodium. She confirms a past history of c-diff. Given the information above, I recommended she contact her PCP, or proceed to a local urgent care or ED for evaluation. She stated that she will call her PCP. Dr Katrinka Blazing also spoke with her about this on 12/06/22.

## 2022-12-07 NOTE — Telephone Encounter (Signed)
  Media Information   Document Information  Megan [name] is calling that she did not go to the ER like instructed.  She did not feel well enough to go sit in the ER for hours. She is asking to speak to the nurse. She is still having low grade fever and very loose BM. She is a a liquid diet. Can you please call her  at [phone].

## 2022-12-08 ENCOUNTER — Other Ambulatory Visit: Payer: Self-pay

## 2022-12-11 NOTE — Progress Notes (Unsigned)
   REFERRING PHYSICIAN:  Mick Sell, Md [address] [place],  [place] [address]  DOS: 12/02/22 Revision of SCS lead with placement at the top of T8  HISTORY OF PRESENT ILLNESS: Glorianne Guisinger is approximately 2 weeks status post revision of SCS lead. Was given oxycodone and zanaflex on discharge from the hospital.   She called with nausea, vomiting, and diarrhea after her surgery. She was advised to f/u with PCP- C.diff was suspected and she was started on vancomycin.         PHYSICAL EXAMINATION:  General: Patient is well developed, well nourished, calm, collected, and in no apparent distress.   NEUROLOGICAL:  General: In no acute distress.   Awake, alert, oriented to person, place, and time.  Pupils equal round and reactive to light.  Facial tone is symmetric.     Strength:            Side Iliopsoas Quads Hamstring PF DF EHL  R 5 5 5 5 5 5   L 5 5 5 5 5 5    Incision c/d/i   ROS (Neurologic):  Negative except as noted above  IMAGING: Nothing new to review.   ASSESSMENT/PLAN:  Anita Balbo is doing well s/p above surgery. Treatment options reviewed with patient and following plan made:   - I have advised the patient to lift up to 10 pounds until 6 weeks after surgery (follow up with Dr. Myer Haff).  - Reviewed wound care.  - No bending, twisting, or lifting.  - Continue on current medications including ***.  - Follow up as scheduled in 4 weeks and prn.   Advised to contact the office if any questions or concerns arise.  Drake Leach PA-C Department of neurosurgery

## 2022-12-14 ENCOUNTER — Ambulatory Visit (INDEPENDENT_AMBULATORY_CARE_PROVIDER_SITE_OTHER): Payer: BC Managed Care – PPO | Admitting: Orthopedic Surgery

## 2022-12-14 ENCOUNTER — Encounter: Payer: Self-pay | Admitting: Orthopedic Surgery

## 2022-12-14 VITALS — BP 136/78 | Temp 98.4°F | Ht 63.0 in | Wt 139.0 lb

## 2022-12-14 DIAGNOSIS — T85192D Other mechanical complication of implanted electronic neurostimulator (electrode) of spinal cord, subsequent encounter: Secondary | ICD-10-CM

## 2022-12-14 DIAGNOSIS — Z09 Encounter for follow-up examination after completed treatment for conditions other than malignant neoplasm: Secondary | ICD-10-CM

## 2022-12-14 DIAGNOSIS — T85192S Other mechanical complication of implanted electronic neurostimulator (electrode) of spinal cord, sequela: Secondary | ICD-10-CM

## 2022-12-14 MED ORDER — OXYCODONE HCL 5 MG PO TABS
5.0000 mg | ORAL_TABLET | Freq: Four times a day (QID) | ORAL | 0 refills | Status: DC | PRN
Start: 2022-12-14 — End: 2023-11-25

## 2022-12-17 ENCOUNTER — Encounter: Payer: BC Managed Care – PPO | Admitting: Orthopedic Surgery

## 2022-12-28 ENCOUNTER — Encounter: Payer: Self-pay | Admitting: Neurosurgery

## 2022-12-28 DIAGNOSIS — Z09 Encounter for follow-up examination after completed treatment for conditions other than malignant neoplasm: Secondary | ICD-10-CM

## 2022-12-28 DIAGNOSIS — T85192S Other mechanical complication of implanted electronic neurostimulator (electrode) of spinal cord, sequela: Secondary | ICD-10-CM

## 2022-12-28 NOTE — Addendum Note (Signed)
Addended byDrake Leach on: 12/28/2022 02:49 PM   Modules accepted: Orders

## 2023-01-07 ENCOUNTER — Other Ambulatory Visit: Payer: Self-pay

## 2023-01-07 DIAGNOSIS — N200 Calculus of kidney: Secondary | ICD-10-CM

## 2023-01-12 ENCOUNTER — Ambulatory Visit
Admission: RE | Admit: 2023-01-12 | Discharge: 2023-01-12 | Disposition: A | Discharge status: Home / Self Care | Payer: BC Managed Care – PPO | Source: Ambulatory Visit | Attending: Urology | Admitting: Urology

## 2023-01-12 ENCOUNTER — Ambulatory Visit (INDEPENDENT_AMBULATORY_CARE_PROVIDER_SITE_OTHER): Payer: BC Managed Care – PPO | Admitting: Urology

## 2023-01-12 ENCOUNTER — Encounter: Payer: Self-pay | Admitting: Urology

## 2023-01-12 VITALS — BP 140/80 | HR 70 | Ht 63.0 in | Wt 137.0 lb

## 2023-01-12 DIAGNOSIS — Z09 Encounter for follow-up examination after completed treatment for conditions other than malignant neoplasm: Secondary | ICD-10-CM | POA: Diagnosis not present

## 2023-01-12 DIAGNOSIS — N200 Calculus of kidney: Secondary | ICD-10-CM

## 2023-01-12 DIAGNOSIS — Z87442 Personal history of urinary calculi: Secondary | ICD-10-CM | POA: Diagnosis not present

## 2023-01-12 NOTE — Progress Notes (Signed)
I,Amy L Pierron,acting as a scribe for Vanna Scotland, MD.,have documented all relevant documentation on the behalf of Vanna Scotland, MD,as directed by  Vanna Scotland, MD while in the presence of Vanna Scotland, MD.  01/12/2023 6:32 PM   Megan [name] October 23, 1960 [id number]  Referring provider: Mick Sell, MD [address] [place],  [place] [address]  Chief Complaint  Patient presents with   Nephrolithiasis    HPI: 62 year-old female with a personal history of kidney stones presents today for a routine annual follow-up with KUB.  On 12/01/2021 she underwent right-sided ureteroscopy with laser lithotripsy. She previously declined doing a 24 hour metabolic evaluation.  About 2 months ago she thinks she may have passed a stone because she had intense lower abdominal pain, but it went away soon after.   PMH: Past Medical History:  Diagnosis Date   Anemia    Arthritis    Asthma    CSR (central serous retinopathy), left    DDD (degenerative disc disease), lumbosacral    DVT (deep venous thrombosis) (HCC) 2008   left femeral s/p femeral osteotomy   Dyspnea    Facet syndrome, lumbar    Family history of adverse reaction to anesthesia    mother had reaction but doesn't know what it is   GERD (gastroesophageal reflux disease)    Heart murmur    History of kidney stones    Hypertension    Hypothyroidism    Lumbar foraminal stenosis    Malfunction of spinal cord stimulator, sequela    Medullary sponge kidney    Pneumonia    Polycystic ovary    PONV (postoperative nausea and vomiting)    Primary osteoarthritis of left knee    Pseudophakia of both eyes    Renal disorder    Thyroid disease    Trigeminal neuralgia     Surgical History: Past Surgical History:  Procedure Laterality Date   ABDOMINAL HYSTERECTOMY     APPENDECTOMY  1980   CYSTOSCOPY/URETEROSCOPY/HOLMIUM LASER/STENT PLACEMENT Right 06/29/2019   Procedure: CYSTOSCOPY/URETEROSCOPY/HOLMIUM  LASER/STENT PLACEMENT;  Surgeon: Vanna Scotland, MD;  Location: [facility];  Service: Urology;  Laterality: Right;   CYSTOSCOPY/URETEROSCOPY/HOLMIUM LASER/STENT PLACEMENT  07/15/2020   CYSTOSCOPY/URETEROSCOPY/HOLMIUM LASER/STENT PLACEMENT Bilateral 12/01/2021   Procedure: CYSTOSCOPY/URETEROSCOPY/HOLMIUM LASER/STENT PLACEMENT;  Surgeon: Vanna Scotland, MD;  Location: [facility];  Service: Urology;  Laterality: Bilateral;   ESOPHAGOGASTRODUODENOSCOPY     2021, 08/16/2020, 11/13/2020   FEMORAL OSTEOTOMY W/ APPLICATION EXTERNAL FIXATOR Right    KNEE ARTHROSCOPY W/ ACL RECONSTRUCTION Left    KNEE ARTHROSCOPY W/ HARDWARE REMOVAL Right 01/13/2017   LAPAROSCOPIC GASTRIC BYPASS  02/12/2020   LITHOTRIPSY     LUMBAR LAMINECTOMY/DECOMPRESSION MICRODISCECTOMY Right 01/22/2021   Procedure: L4-5 DECOMPRESSION;  Surgeon: Venetia Night, MD;  Location: [facility];  Service: Neurosurgery;  Laterality: Right;   LUMBAR SPINAL CORD SIMULATOR REVISION N/A 12/02/2022   Procedure: REVISION OF SPINAL CORD STIMULATOR ([employer]);  Surgeon: Venetia Night, MD;  Location: [facility];  Service: Neurosurgery;  Laterality: N/A;   OSTEOTOMY Bilateral 2017   femoral shaft/supracondylar   PULSE GENERATOR IMPLANT N/A 09/03/2021   Procedure: PULSE GENERATOR IMPLANT;  Surgeon: Venetia Night, MD;  Location: [facility];  Service: Neurosurgery;  Laterality: N/A;   ROTATOR CUFF REPAIR Right    THORACIC LAMINECTOMY FOR SPINAL CORD STIMULATOR N/A 09/03/2021   Procedure: THORACIC LAMINECTOMY FOR SPINAL CORD STIMULATOR PLACEMENT ([employer]);  Surgeon: Venetia Night, MD;  Location: [facility];  Service: Neurosurgery;  Laterality: N/A;   TONSILLECTOMY  1964   TOTAL KNEE ARTHROPLASTY Right 11/11/2018   total knee arthroplasty,      Home Medications:  Allergies as of 01/12/2023       Reactions   Hydrocodone-acetaminophen Shortness Of Breath   Headache, Vomiting, nightmares. Pt is able to take acetaminophen.    Latex Rash, Other (See Comments)   Blisters    Morphine Anaphylaxis, Shortness Of Breath, Nausea And Vomiting, Swelling, Other (See Comments)   Has and facial swelling   Nickel Swelling, Rash   Petrolatum Rash, Other (See Comments)   Blister All petroleum products per patient, facial swelling   Tape Rash   Pt can use paper tape Blisters, tegaderm okay Prefers paper tape   Erythromycin Nausea And Vomiting   Hydromorphone Hcl Nausea And Vomiting   Bad headache   Nsaids    Avoid due to bariatric surgery   Other Other (See Comments)   Generic medications per patient- redness and burning in gums   Sulfa Antibiotics Rash           Medication List        Accurate as of January 12, 2023  6:32 PM. If you have any questions, ask your nurse or doctor.          acetaminophen 325 MG tablet Commonly known as: TYLENOL Take 2 tablets (650 mg total) by mouth every 4 (four) hours as needed for mild pain ((score 1 to 3) or temp > 100.5).   Biotin 5000 MCG Tabs Take 1 tablet by mouth daily.   budesonide 0.5 MG/2ML nebulizer solution Commonly known as: PULMICORT Take 0.5 mg by nebulization daily as needed (shortness of breath).   carbamazepine 200 MG tablet Commonly known as: TEGRETOL Take 200 mg by mouth at bedtime. And PRN   Cholecalciferol 25 MCG (1000 UT) tablet Take 2,000 Units by mouth daily.   fluticasone 50 MCG/ACT nasal spray Commonly known as: FLONASE Place 2 sprays into both nostrils daily.   gabapentin 300 MG capsule Commonly known as: NEURONTIN Take 900 mg by mouth at bedtime. And PRN   levalbuterol 45 MCG/ACT inhaler Commonly known as: XOPENEX HFA Inhale 2 puffs into the lungs every 4 (four) hours as needed for wheezing.   levothyroxine 50 MCG tablet Commonly known as: SYNTHROID Take 50 mcg by mouth daily before breakfast.   losartan 50 MG tablet Commonly known as: COZAAR Take 100 mg by mouth at bedtime.   montelukast 10 MG tablet Commonly known  as: SINGULAIR Take 10 mg by mouth at bedtime.   MULTIVITAMIN ADULT PO Take 1 tablet by mouth daily.   oxyCODONE 5 MG immediate release tablet Commonly known as: Roxicodone Take 1 tablet (5 mg total) by mouth every 6 (six) hours as needed for severe pain.   pantoprazole 40 MG tablet Commonly known as: PROTONIX Take 40 mg by mouth 3 (three) times daily.   senna 8.6 MG Tabs tablet Commonly known as: SENOKOT Take 1 tablet (8.6 mg total) by mouth daily as needed for mild constipation.   tiZANidine 2 MG tablet Commonly known as: ZANAFLEX TAKE 1-2 TABLETS (2-4 MG TOTAL) BY MOUTH EVERY 8 (EIGHT) HOURS AS NEEDED FOR MUSCLE SPASMS.   Trelegy Ellipta 200-62.5-25 MCG/ACT Aepb Generic drug: Fluticasone-Umeclidin-Vilant Inhale 1 puff into the lungs daily.        Allergies:  Allergies  Allergen Reactions   Hydrocodone-Acetaminophen Shortness Of Breath    Headache, Vomiting, nightmares. Pt is able to take acetaminophen.     Latex Rash and Other (See  Comments)    Blisters      Morphine Anaphylaxis, Shortness Of Breath, Nausea And Vomiting, Swelling and Other (See Comments)    Has and facial swelling    Nickel Swelling and Rash   Petrolatum Rash and Other (See Comments)    Blister All petroleum products per patient, facial swelling    Tape Rash    Pt can use paper tape Blisters, tegaderm okay Prefers paper tape   Erythromycin Nausea And Vomiting   Hydromorphone Hcl Nausea And Vomiting    Bad headache   Nsaids     Avoid due to bariatric surgery   Other Other (See Comments)    Generic medications per patient- redness and burning in gums    Sulfa Antibiotics Rash         Social History:  reports that she has never smoked. She has never used smokeless tobacco. She reports that she does not currently use alcohol. She reports that she does not use drugs.   Physical Exam: BP (!) 140/80   Pulse 70   Ht 5\' 3"  (1.6 m)   Wt 137 lb (62.1 kg)   BMI 24.27 kg/m    Constitutional:  Alert and oriented, No acute distress. HEENT: [place] AT, moist mucus membranes.  Trachea midline, no masses. Neurologic: Grossly intact, no focal deficits, moving all 4 extremities. Psychiatric: Normal mood and affect.   Pertinent Imaging: KUB today. Images viewed and awaiting final radiologic interpretation.   Assessment & Plan:    1. History of kidney stones  - KUB today fairly unremarkable. No significant stone burden bilaterally.   - Still declining a metabolic evaluation. Will continue to return annually with a KUB.  Return in about 1 year (around 01/12/2024) for KUB.   [facility] [address] [place], [place] [address] [phone]

## 2023-01-14 ENCOUNTER — Encounter: Payer: Self-pay | Admitting: Neurosurgery

## 2023-01-14 ENCOUNTER — Ambulatory Visit: Payer: BC Managed Care – PPO | Admitting: Neurosurgery

## 2023-01-14 VITALS — BP 140/78 | Ht 63.0 in | Wt 137.8 lb

## 2023-01-14 DIAGNOSIS — Z09 Encounter for follow-up examination after completed treatment for conditions other than malignant neoplasm: Secondary | ICD-10-CM

## 2023-01-14 DIAGNOSIS — T85192D Other mechanical complication of implanted electronic neurostimulator (electrode) of spinal cord, subsequent encounter: Secondary | ICD-10-CM

## 2023-01-14 NOTE — Progress Notes (Signed)
   REFERRING PHYSICIAN:  Mick Sell, Md [address] [place],  [place] [address]  DOS: 12/02/22 Revision of SCS lead with placement at the top of T8  HISTORY OF PRESENT ILLNESS: Megan [name] is status post revision of SCS lead.   She continues to have pain around her incision and in her right hip.  She has pain with flexion and movement of her lower back when she gets to a certain point.  The new placement of the paddle has not helped her to this point.  PHYSICAL EXAMINATION:  General: Patient is well developed, well nourished, calm, collected, and in no apparent distress.   NEUROLOGICAL:  General: In no acute distress.   Awake, alert, oriented to person, place, and time.  Pupils equal round and reactive to light.  Facial tone is symmetric.     Strength:          Side Iliopsoas Quads Hamstring PF DF EHL  R 5 5 5 5 5 5   L 5 5 5 5 5 5    Incision c/d/I.     ROS (Neurologic):  Negative except as noted above  IMAGING: Nothing new to review.   ASSESSMENT/PLAN:  Megan [name] is doing well s/p above surgery.   She will continue to work with the rep regarding programming.    She is seeing Dr. Ernest Pine soon to discuss revision of her right knee replacement.  She had a significant rash after her revision of her spinal cord stimulator.  I will discuss with Dr. Ernest Pine regarding the issues that she suffered.    Venetia Night Department of neurosurgery

## 2023-02-08 ENCOUNTER — Other Ambulatory Visit: Payer: Self-pay | Admitting: Orthopedic Surgery

## 2023-02-08 DIAGNOSIS — G8929 Other chronic pain: Secondary | ICD-10-CM

## 2023-02-11 ENCOUNTER — Other Ambulatory Visit (HOSPITAL_COMMUNITY): Payer: Self-pay | Admitting: Orthopedic Surgery

## 2023-02-11 DIAGNOSIS — G8929 Other chronic pain: Secondary | ICD-10-CM

## 2023-02-25 ENCOUNTER — Ambulatory Visit: Payer: BC Managed Care – PPO | Admitting: Neurosurgery

## 2023-02-25 ENCOUNTER — Ambulatory Visit (HOSPITAL_COMMUNITY)
Admission: RE | Admit: 2023-02-25 | Discharge: 2023-02-25 | Disposition: A | Discharge status: Home / Self Care | Payer: BC Managed Care – PPO | Source: Ambulatory Visit | Attending: Orthopedic Surgery | Admitting: Orthopedic Surgery

## 2023-02-25 DIAGNOSIS — T85192S Other mechanical complication of implanted electronic neurostimulator (electrode) of spinal cord, sequela: Secondary | ICD-10-CM

## 2023-02-25 DIAGNOSIS — M25551 Pain in right hip: Secondary | ICD-10-CM | POA: Insufficient documentation

## 2023-02-25 DIAGNOSIS — T85192D Other mechanical complication of implanted electronic neurostimulator (electrode) of spinal cord, subsequent encounter: Secondary | ICD-10-CM

## 2023-02-25 DIAGNOSIS — G8929 Other chronic pain: Secondary | ICD-10-CM | POA: Insufficient documentation

## 2023-02-25 NOTE — Progress Notes (Signed)
   REFERRING PHYSICIAN:  Mick Sell, Md [address] [place],  [place] [address]  DOS: 12/02/22 Revision of SCS lead with placement at the top of T8  HISTORY OF PRESENT ILLNESS: Roxine Whittinghill is status post revision of SCS lead.  She is doing better,  1 of the settings is helping her.  She is getting relief in her back from one of the settings.  The relief is approximately 50% better.    PHYSICAL EXAMINATION:  Telephone call  ROS (Neurologic):  Negative except as noted above  IMAGING: MRI hip pending read 02/25/2023  ASSESSMENT/PLAN:  Tereasa Coop is doing better s/p above surgery.   She is seeing an orthopedist to consider knee revision.  Her hip MRI is pending.  This visit was performed via telephone.  Patient location: home Provider location: office  I spent a total of 4 minutes non-face-to-face activities for this visit on the date of this encounter including review of current clinical condition and response to treatment.  The patient is aware of and accepts the limits of this telehealth visit.  Venetia Night Department of neurosurgery

## 2023-03-18 LAB — EXTERNAL GENERIC LAB PROCEDURE: COLOGUARD: NEGATIVE

## 2023-03-18 LAB — COLOGUARD: COLOGUARD: NEGATIVE

## 2023-04-20 ENCOUNTER — Telehealth: Payer: Self-pay | Admitting: Neurosurgery

## 2023-04-20 NOTE — Telephone Encounter (Signed)
 Marland Kitchen

## 2023-04-20 NOTE — Telephone Encounter (Signed)
Fax sent back to [facility] stating that pt will contact Dr Mattie Marlin for a refill.

## 2023-06-17 ENCOUNTER — Encounter: Payer: Self-pay | Admitting: Neurosurgery

## 2023-08-02 ENCOUNTER — Encounter: Payer: Self-pay | Admitting: Neurosurgery

## 2023-08-31 ENCOUNTER — Other Ambulatory Visit: Payer: Self-pay | Admitting: Infectious Diseases

## 2023-08-31 DIAGNOSIS — Z9884 Bariatric surgery status: Secondary | ICD-10-CM

## 2023-08-31 DIAGNOSIS — I10 Essential (primary) hypertension: Secondary | ICD-10-CM

## 2023-08-31 DIAGNOSIS — G500 Trigeminal neuralgia: Secondary | ICD-10-CM

## 2023-08-31 DIAGNOSIS — K5909 Other constipation: Secondary | ICD-10-CM

## 2023-08-31 DIAGNOSIS — E039 Hypothyroidism, unspecified: Secondary | ICD-10-CM

## 2023-08-31 DIAGNOSIS — R1032 Left lower quadrant pain: Secondary | ICD-10-CM

## 2023-08-31 DIAGNOSIS — K219 Gastro-esophageal reflux disease without esophagitis: Secondary | ICD-10-CM

## 2023-08-31 DIAGNOSIS — D649 Anemia, unspecified: Secondary | ICD-10-CM

## 2023-08-31 DIAGNOSIS — Z1231 Encounter for screening mammogram for malignant neoplasm of breast: Secondary | ICD-10-CM

## 2023-08-31 DIAGNOSIS — R7303 Prediabetes: Secondary | ICD-10-CM

## 2023-08-31 NOTE — Progress Notes (Addendum)
 Pt has Games developer and leads implanted as noted below:

## 2023-09-01 ENCOUNTER — Other Ambulatory Visit

## 2023-09-02 ENCOUNTER — Ambulatory Visit
Admission: RE | Admit: 2023-09-02 | Discharge: 2023-09-02 | Disposition: A | Discharge status: Home / Self Care | Source: Ambulatory Visit | Attending: Infectious Diseases | Admitting: Infectious Diseases

## 2023-09-02 DIAGNOSIS — K5909 Other constipation: Secondary | ICD-10-CM

## 2023-09-02 DIAGNOSIS — K219 Gastro-esophageal reflux disease without esophagitis: Secondary | ICD-10-CM

## 2023-09-02 DIAGNOSIS — R1032 Left lower quadrant pain: Secondary | ICD-10-CM

## 2023-09-02 DIAGNOSIS — D649 Anemia, unspecified: Secondary | ICD-10-CM

## 2023-09-02 DIAGNOSIS — E039 Hypothyroidism, unspecified: Secondary | ICD-10-CM

## 2023-09-02 DIAGNOSIS — G500 Trigeminal neuralgia: Secondary | ICD-10-CM

## 2023-09-02 DIAGNOSIS — I10 Essential (primary) hypertension: Secondary | ICD-10-CM

## 2023-09-02 DIAGNOSIS — Z9884 Bariatric surgery status: Secondary | ICD-10-CM

## 2023-09-02 DIAGNOSIS — R7303 Prediabetes: Secondary | ICD-10-CM

## 2023-09-02 MED ORDER — IOPAMIDOL (ISOVUE-300) INJECTION 61%
100.0000 mL | Freq: Once | INTRAVENOUS | Status: AC | PRN
  Administered 2023-09-02: 100 mL via INTRAVENOUS

## 2023-09-07 ENCOUNTER — Ambulatory Visit: Admitting: Physician Assistant

## 2023-09-07 VITALS — BP 160/97

## 2023-09-07 DIAGNOSIS — N200 Calculus of kidney: Secondary | ICD-10-CM

## 2023-09-07 LAB — URINALYSIS, COMPLETE
Bilirubin, UA: NEGATIVE
Glucose, UA: NEGATIVE
Ketones, UA: NEGATIVE
Leukocytes,UA: NEGATIVE
Nitrite, UA: NEGATIVE
Protein,UA: NEGATIVE
RBC, UA: NEGATIVE
Specific Gravity, UA: 1.02 (ref 1.005–1.030)
Urobilinogen, Ur: 0.2 mg/dL (ref 0.2–1.0)
pH, UA: 6 (ref 5.0–7.5)

## 2023-09-07 LAB — MICROSCOPIC EXAMINATION: Epithelial Cells (non renal): >10 /HPF — AB (ref 0–10)

## 2023-09-07 NOTE — Progress Notes (Signed)
 09/07/2023 2:28 PM   Megan [name] 28-Jun-1960 [id number]  CC: Chief Complaint  Patient presents with   Follow-up    CT Scan   HPI: Megan [name] is a 63 y.o. female with PMH nephrolithiasis who presents today to discuss CT results.   She had a CT AP with contrast on 09/02/2023 with Dr. Harwood Lingo for evaluation of left sided abdominal pain and diarrhea.  UA at the time was bland.  It showed bilateral nonobstructing renal stones measuring up to 11 mm in the left kidney.  This stone is stable in appearance, dating back to at least February 2022.  Interestingly, she underwent bilateral ureteroscopy with laser lithotripsy and stent placement with Dr. Ace Holder in August 2023.  Today she reports she has a spinal stimulator in place for management of her chronic back pain, and it works well on the right side but not so much on the left.  She has also noticed some gross hematuria in the last 3 weeks and left flank pain for 2 to 3 months.  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  In-office UA today pan negative; urine microscopy with >10 epithelial cells/hpf and many bacteria.  PMH: Past Medical History:  Diagnosis Date   Anemia    Arthritis    Asthma    CSR (central serous retinopathy), left    DDD (degenerative disc disease), lumbosacral    DVT (deep venous thrombosis) (HCC) 2008   left femeral s/p femeral osteotomy   Dyspnea    Facet syndrome, lumbar    Family history of adverse reaction to anesthesia    mother had reaction but doesn't know what it is   GERD (gastroesophageal reflux disease)    Heart murmur    History of kidney stones    Hypertension    Hypothyroidism    Lumbar foraminal stenosis    Malfunction of spinal cord stimulator, sequela    Medullary sponge kidney     Pneumonia    Polycystic ovary    PONV (postoperative nausea and vomiting)    Primary osteoarthritis of left knee    Pseudophakia of both eyes    Renal disorder    Thyroid disease    Trigeminal neuralgia     Surgical History: Past Surgical History:  Procedure Laterality Date   ABDOMINAL HYSTERECTOMY     APPENDECTOMY  1980   CYSTOSCOPY/URETEROSCOPY/HOLMIUM LASER/STENT PLACEMENT Right 06/29/2019   Procedure: CYSTOSCOPY/URETEROSCOPY/HOLMIUM LASER/STENT PLACEMENT;  Surgeon: Dustin Gimenez, MD;  Location: [facility];  Service: Urology;  Laterality: Right;   CYSTOSCOPY/URETEROSCOPY/HOLMIUM LASER/STENT PLACEMENT  07/15/2020   CYSTOSCOPY/URETEROSCOPY/HOLMIUM LASER/STENT PLACEMENT Bilateral 12/01/2021   Procedure: CYSTOSCOPY/URETEROSCOPY/HOLMIUM LASER/STENT PLACEMENT;  Surgeon: Dustin Gimenez, MD;  Location: [facility];  Service: Urology;  Laterality: Bilateral;   ESOPHAGOGASTRODUODENOSCOPY     2021, 08/16/2020, 11/13/2020   FEMORAL OSTEOTOMY W/ APPLICATION EXTERNAL FIXATOR Right    KNEE ARTHROSCOPY W/ ACL RECONSTRUCTION Left    KNEE ARTHROSCOPY W/ HARDWARE REMOVAL Right 01/13/2017   LAPAROSCOPIC GASTRIC BYPASS  02/12/2020   LITHOTRIPSY     LUMBAR LAMINECTOMY/DECOMPRESSION MICRODISCECTOMY Right 01/22/2021   Procedure: L4-5 DECOMPRESSION;  Surgeon: Jodeen Munch, MD;  Location: [facility];  Service: Neurosurgery;  Laterality: Right;   LUMBAR SPINAL CORD SIMULATOR REVISION N/A 12/02/2022   Procedure: REVISION OF SPINAL CORD STIMULATOR ([employer]);  Surgeon: Jodeen Munch, MD;  Location: [facility];  Service: Neurosurgery;  Laterality: N/A;   OSTEOTOMY Bilateral 2017   femoral shaft/supracondylar   PULSE GENERATOR IMPLANT N/A 09/03/2021   Procedure: PULSE GENERATOR IMPLANT;  Surgeon: Jodeen Munch, MD;  Location: [facility];  Service: Neurosurgery;  Laterality: N/A;   ROTATOR CUFF REPAIR Right    THORACIC LAMINECTOMY FOR SPINAL CORD STIMULATOR N/A 09/03/2021   Procedure: THORACIC  LAMINECTOMY FOR SPINAL CORD STIMULATOR PLACEMENT ([employer]);  Surgeon: Jodeen Munch, MD;  Location: [facility];  Service: Neurosurgery;  Laterality: N/A;   TONSILLECTOMY  1964   TOTAL KNEE ARTHROPLASTY Right 11/11/2018   total knee arthroplasty,      Home Medications:  Allergies as of 09/07/2023       Reactions   Hydrocodone-acetaminophen  Shortness Of Breath   Headache, Vomiting, nightmares. Pt is able to take acetaminophen .   Latex Rash, Other (See Comments)   Blisters    Morphine  Anaphylaxis, Shortness Of Breath, Nausea And Vomiting, Swelling, Other (See Comments)   Has and facial swelling   Nickel Swelling, Rash   Petrolatum Rash, Other (See Comments)   Blister All petroleum products per patient, facial swelling   Tape Rash   Pt can use paper tape Blisters, tegaderm okay Prefers paper tape   Erythromycin Nausea And Vomiting   Hydromorphone  Hcl Nausea And Vomiting   Bad headache   Nsaids    Avoid due to bariatric surgery   Other Other (See Comments)   Generic medications per patient- redness and burning in gums   Sulfa Antibiotics Rash           Medication List        Accurate as of Sep 07, 2023  2:28 PM. If you have any questions, ask your nurse or doctor.          acetaminophen  325 MG tablet Commonly known as: TYLENOL  Take 2 tablets (650 mg total) by mouth every 4 (four) hours as needed for mild pain ((score 1 to 3) or temp > 100.5).   Biotin 5000 MCG Tabs Take 1 tablet by mouth daily.   budesonide  0.5 MG/2ML nebulizer solution Commonly known as: PULMICORT  Take 0.5 mg by nebulization daily as needed (shortness of breath).   carbamazepine  200 MG tablet Commonly known as: TEGRETOL  Take 200 mg by mouth at bedtime. And PRN   Cholecalciferol  25 MCG (1000 UT) tablet Take 2,000 Units by mouth daily.   fluticasone  50 MCG/ACT nasal spray Commonly known as: FLONASE  Place 2 sprays into both nostrils daily.   gabapentin  300 MG capsule Commonly  known as: NEURONTIN  Take 900 mg by mouth at bedtime. And PRN   levalbuterol  45 MCG/ACT inhaler Commonly known as: XOPENEX  HFA Inhale 2 puffs into the lungs every 4 (four) hours as needed for wheezing.   levothyroxine  50 MCG tablet Commonly known as: SYNTHROID  Take 50 mcg by mouth daily before breakfast.   losartan  50 MG tablet Commonly known as: COZAAR  Take 100 mg by mouth at bedtime.   montelukast  10 MG tablet Commonly known as: SINGULAIR  Take 10 mg by mouth at bedtime.   MULTIVITAMIN ADULT PO Take 1 tablet by mouth daily.   oxyCODONE  5 MG immediate release tablet Commonly known as: Roxicodone  Take 1 tablet (5 mg total) by mouth every 6 (six) hours as needed for severe pain.   pantoprazole  40 MG tablet Commonly known as: PROTONIX  Take 40 mg by mouth 3 (three) times daily.   senna 8.6 MG Tabs tablet Commonly known as:  SENOKOT Take 1 tablet (8.6 mg total) by mouth daily as needed for mild constipation.   tiZANidine  2 MG tablet Commonly known as: ZANAFLEX  TAKE 1-2 TABLETS (2-4 MG TOTAL) BY MOUTH EVERY 8 (EIGHT) HOURS AS NEEDED FOR MUSCLE SPASMS.   Trelegy Ellipta 200-62.5-25 MCG/ACT Aepb Generic drug: Fluticasone -Umeclidin-Vilant Inhale 1 puff into the lungs daily.        Allergies:  Allergies  Allergen Reactions   Hydrocodone-Acetaminophen  Shortness Of Breath    Headache, Vomiting, nightmares. Pt is able to take acetaminophen .     Latex Rash and Other (See Comments)    Blisters      Morphine  Anaphylaxis, Shortness Of Breath, Nausea And Vomiting, Swelling and Other (See Comments)    Has and facial swelling    Nickel Swelling and Rash   Petrolatum Rash and Other (See Comments)    Blister All petroleum products per patient, facial swelling    Tape Rash    Pt can use paper tape Blisters, tegaderm okay Prefers paper tape   Erythromycin Nausea And Vomiting   Hydromorphone  Hcl Nausea And Vomiting    Bad headache   Nsaids     Avoid due to bariatric  surgery   Other Other (See Comments)    Generic medications per patient- redness and burning in gums    Sulfa Antibiotics Rash         Family History: No family history on file.  Social History:   reports that she has never smoked. She has never used smokeless tobacco. She reports that she does not currently use alcohol. She reports that she does not use drugs.  Physical Exam: There were no vitals taken for this visit.  Constitutional:  Alert and oriented, no acute distress, nontoxic appearing HEENT: [place], AT Cardiovascular: No clubbing, cyanosis, or edema Respiratory: Normal respiratory effort, no increased work of breathing Skin: No rashes, bruises or suspicious lesions Neurologic: Grossly intact, no focal deficits, moving all 4 extremities Psychiatric: Normal mood and affect  Laboratory Data: Results for orders placed or performed in visit on 09/07/23  CULTURE, URINE COMPREHENSIVE   Collection Time: 09/07/23  3:00 PM   Specimen: Urine   UR  Result Value Ref Range   Urine Culture, Comprehensive Preliminary report    Organism ID, Bacteria Comment   Microscopic Examination   Collection Time: 09/07/23  3:00 PM   Urine  Result Value Ref Range   WBC, UA 0-5 0 - 5 /hpf   RBC, Urine 0-2 0 - 2 /hpf   Epithelial Cells (non renal) >10 (A) 0 - 10 /hpf   Casts Present (A) None seen /lpf   Cast Type Hyaline casts N/A   Mucus, UA Present (A) Not Estab.   Bacteria, UA Many (A) None seen/Few  Urinalysis, Complete   Collection Time: 09/07/23  3:00 PM  Result Value Ref Range   Specific Gravity, UA 1.020 1.005 - 1.030   pH, UA 6.0 5.0 - 7.5   Color, UA Yellow Yellow   Appearance Ur Slightly cloudy Clear   Leukocytes,UA Negative Negative   Protein,UA Negative Negative/Trace   Glucose, UA Negative Negative   Ketones, UA Negative Negative   RBC, UA Negative Negative   Bilirubin, UA Negative Negative   Urobilinogen, Ur 0.2 0.2 - 1.0 mg/dL   Nitrite, UA Negative Negative    Microscopic Examination Comment    Microscopic Examination See below:     Pertinent Imaging: CT AP with contrast, 09/02/2023: CLINICAL DATA:  Left-sided abdominal pain, diarrhea.  EXAM: CT ABDOMEN AND PELVIS WITH CONTRAST   TECHNIQUE: Multidetector CT imaging of the abdomen and pelvis was performed using the standard protocol following bolus administration of intravenous contrast.   RADIATION DOSE REDUCTION: This exam was performed according to the departmental dose-optimization program which includes automated exposure control, adjustment of the mA and/or kV according to patient size and/or use of iterative reconstruction technique.   CONTRAST:  100mL ISOVUE -300 IOPAMIDOL  (ISOVUE -300) INJECTION 61%   COMPARISON:  November 10, 2021.   FINDINGS: Lower chest: No acute abnormality.   Hepatobiliary: No focal liver abnormality is seen. No gallstones, gallbladder wall thickening, or biliary dilatation.   Pancreas: Unremarkable. No pancreatic ductal dilatation or surrounding inflammatory changes.   Spleen: Normal in size without focal abnormality.   Adrenals/Urinary Tract: Adrenal glands appear normal. 11 mm nonobstructive calculus seen in left kidney. Small nonobstructive calculus seen in right kidney. No hydronephrosis or renal obstruction. Urinary bladder is unremarkable.   Stomach/Bowel: Status post gastric bypass. Status post appendectomy. There is no evidence of bowel obstruction or inflammation.   Vascular/Lymphatic: Aortic atherosclerosis. No enlarged abdominal or pelvic lymph nodes.   Reproductive: Status post hysterectomy. No adnexal masses.   Other: No ascites or hernia.   Musculoskeletal: No acute or significant osseous findings.   IMPRESSION: Bilateral nonobstructive nephrolithiasis.   Aortic Atherosclerosis (ICD10-I70.0).     Electronically Signed   By: Rosalene Colon M.D.   On: 09/02/2023 12:25  I personally reviewed the images referenced above and  note an 11mm left renal stone, incomplete evaluation of stone burden in the setting of IV contrast.  Assessment & Plan:   1. Bilateral nephrolithiasis (Primary) Bilateral renal stones on recent CT scan, though complete evaluation of her stone burden was somewhat obscured by the use of IV contrast.  Most significantly, she has an 11 mm left renal stone that appears stable dating back at least 3 years, however she underwent ureteroscopy during this time.  Unclear if this indicates stone regrowth versus a parenchymal calcification.  Normally I would recommend surveillance for this, however she is at risk of losing her employment due to an acute stone episode and she is very motivated to clear her stone burden.  I am going to discuss this further with Dr. Cherylene Corrente and reach back out to her in light of Dr. Jeni Mitten departure.  Will send UA for possible preop culture today. - Urinalysis, Complete - CULTURE, URINE COMPREHENSIVE   Return for Will call to discuss plan per Dr. Cherylene Corrente.  Kathreen Pare, PA-C  [facility] [place] [address] [place], [place] [address] [phone]

## 2023-09-09 ENCOUNTER — Inpatient Hospital Stay: Attending: Oncology | Admitting: Oncology

## 2023-09-09 ENCOUNTER — Encounter: Payer: Self-pay | Admitting: Oncology

## 2023-09-09 ENCOUNTER — Inpatient Hospital Stay

## 2023-09-09 VITALS — BP 168/95 | HR 89 | Temp 97.4°F | Resp 16 | Ht 63.0 in | Wt 131.9 lb

## 2023-09-09 DIAGNOSIS — D509 Iron deficiency anemia, unspecified: Secondary | ICD-10-CM | POA: Insufficient documentation

## 2023-09-09 DIAGNOSIS — I10 Essential (primary) hypertension: Secondary | ICD-10-CM | POA: Diagnosis not present

## 2023-09-09 DIAGNOSIS — Z9884 Bariatric surgery status: Secondary | ICD-10-CM | POA: Diagnosis not present

## 2023-09-09 LAB — CBC (CANCER CENTER ONLY)
HCT: 35.7 % — ABNORMAL LOW (ref 36.0–46.0)
Hemoglobin: 11.6 g/dL — ABNORMAL LOW (ref 12.0–15.0)
MCH: 28.4 pg (ref 26.0–34.0)
MCHC: 32.5 g/dL (ref 30.0–36.0)
MCV: 87.5 fL (ref 80.0–100.0)
Platelet Count: 288 K/uL (ref 150–400)
RBC: 4.08 MIL/uL (ref 3.87–5.11)
RDW: 15.6 % — ABNORMAL HIGH (ref 11.5–15.5)
WBC Count: 4.6 K/uL (ref 4.0–10.5)
nRBC: 0 % (ref 0.0–0.2)

## 2023-09-09 LAB — FOLATE: Folate: >40 ng/mL (ref >5.9)

## 2023-09-09 LAB — FERRITIN: Ferritin: 4 ng/mL — ABNORMAL LOW (ref 11–307)

## 2023-09-09 LAB — IRON AND TIBC
Iron: 49 ug/dL (ref 28–170)
Saturation Ratios: 9 % — ABNORMAL LOW (ref 10.4–31.8)
TIBC: 539 ug/dL — ABNORMAL HIGH (ref 250–450)
UIBC: 490 ug/dL

## 2023-09-09 LAB — LACTATE DEHYDROGENASE: LDH: 158 U/L (ref 98–192)

## 2023-09-09 LAB — VITAMIN B12: Vitamin B-12: 1480 pg/mL — ABNORMAL HIGH (ref 180–914)

## 2023-09-09 NOTE — Progress Notes (Signed)
 [facility]  [phone] [phone]  ID: Megan [name] OB: March 11, 1961  MR#: [id number]  AOZ#:[id number]  Patient Care Team: Eartha Gold, MD as PCP - General (Infectious Diseases) Shellie Dials, MD as Consulting Physician (Oncology)  CHIEF COMPLAINT: Iron deficiency anemia.  INTERVAL HISTORY: Patient is a 63 year old female with a history of Roux-en-Y gastric bypass who was noted to have a declining hemoglobin and iron stores.  She currently feels well and is asymptomatic.  She has no neurologic complaints.  She denies any recent fevers or illnesses.  She has a good appetite and denies weight loss.  She has no chest pain, shortness of breath, cough, or hemoptysis.  She denies any nausea, vomiting, constipation, or diarrhea.  She has no melena or hematochezia.  She has no urinary complaints.  Patient offers no further specific complaints today.  REVIEW OF SYSTEMS:   Review of Systems  Constitutional: Negative.  Negative for fever, malaise/fatigue and weight loss.  Respiratory: Negative.  Negative for cough, hemoptysis and shortness of breath.   Cardiovascular: Negative.  Negative for chest pain and leg swelling.  Gastrointestinal:  Negative for abdominal pain, blood in stool and melena.  Genitourinary: Negative.  Negative for hematuria.  Musculoskeletal:  Negative for back pain.  Skin: Negative.  Negative for rash.  Neurological: Negative.  Negative for dizziness, focal weakness, weakness and headaches.  Psychiatric/Behavioral:  The patient is not nervous/anxious.     As per HPI. Otherwise, a complete review of systems is negative.  PAST MEDICAL HISTORY: Past Medical History:  Diagnosis Date   Anemia    Arthritis    Asthma    CSR (central serous retinopathy), left    DDD (degenerative disc disease), lumbosacral    DVT (deep venous thrombosis) (HCC) 2008   left femeral s/p femeral osteotomy   Dyspnea    Facet syndrome,  lumbar    Family history of adverse reaction to anesthesia    mother had reaction but doesn't know what it is   GERD (gastroesophageal reflux disease)    Heart murmur    History of kidney stones    Hypertension    Hypothyroidism    Lumbar foraminal stenosis    Malfunction of spinal cord stimulator, sequela    Medullary sponge kidney    Pneumonia    Polycystic ovary    PONV (postoperative nausea and vomiting)    Primary osteoarthritis of left knee    Pseudophakia of both eyes    Renal disorder    Thyroid disease    Trigeminal neuralgia     PAST SURGICAL HISTORY: Past Surgical History:  Procedure Laterality Date   ABDOMINAL HYSTERECTOMY     APPENDECTOMY  1980   CYSTOSCOPY/URETEROSCOPY/HOLMIUM LASER/STENT PLACEMENT Right 06/29/2019   Procedure: CYSTOSCOPY/URETEROSCOPY/HOLMIUM LASER/STENT PLACEMENT;  Surgeon: Dustin Gimenez, MD;  Location: [facility];  Service: Urology;  Laterality: Right;   CYSTOSCOPY/URETEROSCOPY/HOLMIUM LASER/STENT PLACEMENT  07/15/2020   CYSTOSCOPY/URETEROSCOPY/HOLMIUM LASER/STENT PLACEMENT Bilateral 12/01/2021   Procedure: CYSTOSCOPY/URETEROSCOPY/HOLMIUM LASER/STENT PLACEMENT;  Surgeon: Dustin Gimenez, MD;  Location: [facility];  Service: Urology;  Laterality: Bilateral;   ESOPHAGOGASTRODUODENOSCOPY     2021, 08/16/2020, 11/13/2020   FEMORAL OSTEOTOMY W/ APPLICATION EXTERNAL FIXATOR Right    KNEE ARTHROSCOPY W/ ACL RECONSTRUCTION Left    KNEE ARTHROSCOPY W/ HARDWARE REMOVAL Right 01/13/2017   LAPAROSCOPIC GASTRIC BYPASS  02/12/2020   LITHOTRIPSY     LUMBAR LAMINECTOMY/DECOMPRESSION MICRODISCECTOMY Right 01/22/2021   Procedure: L4-5 DECOMPRESSION;  Surgeon: Jodeen Munch, MD;  Location: [facility];  Service: Neurosurgery;  Laterality: Right;   LUMBAR SPINAL CORD SIMULATOR REVISION N/A 12/02/2022   Procedure: REVISION OF SPINAL CORD STIMULATOR ([employer]);  Surgeon: Jodeen Munch, MD;  Location: [facility];  Service: Neurosurgery;  Laterality: N/A;    OSTEOTOMY Bilateral 2017   femoral shaft/supracondylar   PULSE GENERATOR IMPLANT N/A 09/03/2021   Procedure: PULSE GENERATOR IMPLANT;  Surgeon: Jodeen Munch, MD;  Location: [facility];  Service: Neurosurgery;  Laterality: N/A;   ROTATOR CUFF REPAIR Right    THORACIC LAMINECTOMY FOR SPINAL CORD STIMULATOR N/A 09/03/2021   Procedure: THORACIC LAMINECTOMY FOR SPINAL CORD STIMULATOR PLACEMENT ([employer]);  Surgeon: Jodeen Munch, MD;  Location: [facility];  Service: Neurosurgery;  Laterality: N/A;   TONSILLECTOMY  1964   TOTAL KNEE ARTHROPLASTY Right 11/11/2018   total knee arthroplasty,      FAMILY HISTORY: History reviewed. No pertinent family history.  ADVANCED DIRECTIVES (Y/N):  N  HEALTH MAINTENANCE: Social History   Tobacco Use   Smoking status: Never   Smokeless tobacco: Never  Vaping Use   Vaping status: Never Used  Substance Use Topics   Alcohol use: Not Currently   Drug use: Never     Colonoscopy:  PAP:  Bone density:  Lipid panel:  Allergies  Allergen Reactions   Hydrocodone-Acetaminophen  Shortness Of Breath    Headache, Vomiting, nightmares. Pt is able to take acetaminophen .     Latex Rash and Other (See Comments)    Blisters      Morphine  Anaphylaxis, Shortness Of Breath, Nausea And Vomiting, Swelling and Other (See Comments)    Has and facial swelling    Nickel Swelling and Rash   Petrolatum Rash and Other (See Comments)    Blister All petroleum products per patient, facial swelling    Tape Rash    Pt can use paper tape Blisters, tegaderm okay Prefers paper tape   Erythromycin Nausea And Vomiting   Hydromorphone  Hcl Nausea And Vomiting    Bad headache   Nsaids     Avoid due to bariatric surgery   Other Other (See Comments)    Generic medications per patient- redness and burning in gums    Silicone Dermatitis    Including Tegraderm- Rash & Blisters,   Prefers paper tape   Sulfa Antibiotics Rash        Tegaderm Ag Mesh  [Silver] Rash    Current Outpatient Medications  Medication Sig Dispense Refill   acetaminophen  (TYLENOL ) 325 MG tablet Take 2 tablets (650 mg total) by mouth every 4 (four) hours as needed for mild pain ((score 1 to 3) or temp > 100.5). 30 tablet 0   Biotin 5000 MCG TABS Take 1 tablet by mouth daily.     budesonide  (PULMICORT ) 0.5 MG/2ML nebulizer solution Take 0.5 mg by nebulization daily as needed (shortness of breath).     carbamazepine  (TEGRETOL ) 200 MG tablet Take 200 mg by mouth at bedtime. And PRN     Cholecalciferol  25 MCG (1000 UT) tablet Take 2,000 Units by mouth daily.     fluticasone  (FLONASE ) 50 MCG/ACT nasal spray Place 2 sprays into both nostrils daily.     gabapentin  (NEURONTIN ) 300 MG capsule Take 900 mg by mouth at bedtime. And PRN     levalbuterol  (XOPENEX  HFA) 45 MCG/ACT inhaler Inhale 2 puffs into the lungs every 4 (four) hours as needed for wheezing.     levothyroxine  (SYNTHROID ) 50 MCG tablet Take 50 mcg by mouth daily before breakfast.     losartan  (COZAAR ) 50  MG tablet Take 100 mg by mouth at bedtime.     montelukast  (SINGULAIR ) 10 MG tablet Take 10 mg by mouth at bedtime.     Multiple Vitamin (MULTIVITAMIN ADULT PO) Take 1 tablet by mouth daily.     oxyCODONE  (ROXICODONE ) 5 MG immediate release tablet Take 1 tablet (5 mg total) by mouth every 6 (six) hours as needed for severe pain. 20 tablet 0   pantoprazole  (PROTONIX ) 40 MG tablet Take 40 mg by mouth 3 (three) times daily.     senna (SENOKOT) 8.6 MG TABS tablet Take 1 tablet (8.6 mg total) by mouth daily as needed for mild constipation. 30 tablet 0   tiZANidine  (ZANAFLEX ) 2 MG tablet TAKE 1-2 TABLETS (2-4 MG TOTAL) BY MOUTH EVERY 8 (EIGHT) HOURS AS NEEDED FOR MUSCLE SPASMS. 360 tablet 0   TRELEGY ELLIPTA 200-62.5-25 MCG/ACT AEPB Inhale 1 puff into the lungs daily.     No current facility-administered medications for this visit.    OBJECTIVE: Vitals:   09/09/23 1113  BP: (!) 168/95  Pulse: 89  Resp: 16   Temp: (!) 97.4 F (36.3 C)  SpO2: 100%     Body mass index is 23.37 kg/m.    ECOG FS:0 - Asymptomatic  General: Well-developed, well-nourished, no acute distress. Eyes: Pink conjunctiva, anicteric sclera. HEENT: Normocephalic, moist mucous membranes. Lungs: No audible wheezing or coughing. Heart: Regular rate and rhythm. Abdomen: Soft, nontender, no obvious distention. Musculoskeletal: No edema, cyanosis, or clubbing. Neuro: Alert, answering all questions appropriately. Cranial nerves grossly intact. Skin: No rashes or petechiae noted. Psych: Normal affect. Lymphatics: No cervical, calvicular, axillary or inguinal LAD.   LAB RESULTS:  Lab Results  Component Value Date   NA 141 08/22/2021   K 4.2 08/22/2021   CL 105 08/22/2021   CO2 27 08/22/2021   GLUCOSE 96 08/22/2021   BUN 16 08/22/2021   CREATININE 0.68 12/03/2022   CALCIUM 9.5 08/22/2021   PROT 7.2 12/09/2020   ALBUMIN 4.1 12/09/2020   AST 21 12/09/2020   ALT 16 12/09/2020   ALKPHOS 82 12/09/2020   BILITOT 0.4 12/09/2020   GFRNONAA >60 12/03/2022   GFRAA >60 01/02/2020    Lab Results  Component Value Date   WBC 4.6 09/09/2023   NEUTROABS 4.6 01/02/2020   HGB 11.6 (L) 09/09/2023   HCT 35.7 (L) 09/09/2023   MCV 87.5 09/09/2023   PLT 288 09/09/2023   Lab Results  Component Value Date   IRON 49 09/09/2023   TIBC 539 (H) 09/09/2023   IRONPCTSAT 9 (L) 09/09/2023   Lab Results  Component Value Date   FERRITIN 4 (L) 09/09/2023     STUDIES: CT ABDOMEN PELVIS W CONTRAST Result Date: 09/02/2023 CLINICAL DATA:  Left-sided abdominal pain, diarrhea. EXAM: CT ABDOMEN AND PELVIS WITH CONTRAST TECHNIQUE: Multidetector CT imaging of the abdomen and pelvis was performed using the standard protocol following bolus administration of intravenous contrast. RADIATION DOSE REDUCTION: This exam was performed according to the departmental dose-optimization program which includes automated exposure control, adjustment of the  mA and/or kV according to patient size and/or use of iterative reconstruction technique. CONTRAST:  100mL ISOVUE -300 IOPAMIDOL  (ISOVUE -300) INJECTION 61% COMPARISON:  November 10, 2021. FINDINGS: Lower chest: No acute abnormality. Hepatobiliary: No focal liver abnormality is seen. No gallstones, gallbladder wall thickening, or biliary dilatation. Pancreas: Unremarkable. No pancreatic ductal dilatation or surrounding inflammatory changes. Spleen: Normal in size without focal abnormality. Adrenals/Urinary Tract: Adrenal glands appear normal. 11 mm nonobstructive calculus seen in left kidney. Small  nonobstructive calculus seen in right kidney. No hydronephrosis or renal obstruction. Urinary bladder is unremarkable. Stomach/Bowel: Status post gastric bypass. Status post appendectomy. There is no evidence of bowel obstruction or inflammation. Vascular/Lymphatic: Aortic atherosclerosis. No enlarged abdominal or pelvic lymph nodes. Reproductive: Status post hysterectomy. No adnexal masses. Other: No ascites or hernia. Musculoskeletal: No acute or significant osseous findings. IMPRESSION: Bilateral nonobstructive nephrolithiasis. Aortic Atherosclerosis (ICD10-I70.0). Electronically Signed   By: Rosalene Colon M.D.   On: 09/02/2023 12:25    ASSESSMENT: Iron deficiency anemia.  PLAN:    Iron deficiency anemia: Possibly secondary to poor absorption.  Patient's hemoglobin and iron stores are reduced and she will benefit from IV iron.  She has no evidence of hemolysis and folate levels within normal limits.  Return to clinic 5 times over the next 1 to 2 weeks to receive 200 mg IV Venofer.  Patient will then return to clinic in 4 months with repeat laboratory work, further evaluation, and continuation of treatment if needed. Hypertension: Patient's blood pressure is moderately elevated today.  Continue monitoring and treatment per primary care.  I spent a total of 45 minutes reviewing chart data, face-to-face evaluation  with the patient, counseling and coordination of care as detailed above.   Patient expressed understanding and was in agreement with this plan. She also understands that She can call clinic at any time with any questions, concerns, or complaints.     Shellie Dials, MD   09/09/2023 2:56 PM

## 2023-09-10 ENCOUNTER — Inpatient Hospital Stay

## 2023-09-10 VITALS — BP 168/99 | HR 74 | Temp 97.0°F | Resp 16

## 2023-09-10 DIAGNOSIS — D509 Iron deficiency anemia, unspecified: Secondary | ICD-10-CM

## 2023-09-10 MED ORDER — IRON SUCROSE 20 MG/ML IV SOLN
200.0000 mg | Freq: Once | INTRAVENOUS | Status: AC
  Administered 2023-09-10: 200 mg via INTRAVENOUS
  Filled 2023-09-10: qty 10

## 2023-09-10 NOTE — Patient Instructions (Signed)

## 2023-09-11 LAB — HAPTOGLOBIN: Haptoglobin: 135 mg/dL (ref 37–355)

## 2023-09-11 LAB — CULTURE, URINE COMPREHENSIVE

## 2023-09-15 DIAGNOSIS — N200 Calculus of kidney: Secondary | ICD-10-CM

## 2023-09-16 ENCOUNTER — Inpatient Hospital Stay

## 2023-09-16 ENCOUNTER — Encounter

## 2023-09-16 VITALS — BP 147/84 | HR 88 | Temp 96.7°F | Resp 17

## 2023-09-16 DIAGNOSIS — D509 Iron deficiency anemia, unspecified: Secondary | ICD-10-CM

## 2023-09-16 MED ORDER — IRON SUCROSE 20 MG/ML IV SOLN
200.0000 mg | Freq: Once | INTRAVENOUS | Status: AC
  Administered 2023-09-16: 200 mg via INTRAVENOUS

## 2023-09-20 ENCOUNTER — Inpatient Hospital Stay: Attending: Oncology

## 2023-09-20 VITALS — BP 149/93 | HR 78 | Temp 95.3°F | Resp 19

## 2023-09-20 DIAGNOSIS — D509 Iron deficiency anemia, unspecified: Secondary | ICD-10-CM | POA: Diagnosis present

## 2023-09-20 DIAGNOSIS — K909 Intestinal malabsorption, unspecified: Secondary | ICD-10-CM | POA: Diagnosis not present

## 2023-09-20 MED ORDER — SODIUM CHLORIDE 0.9% FLUSH
10.0000 mL | Freq: Once | INTRAVENOUS | Status: AC | PRN
  Administered 2023-09-20: 10 mL
  Filled 2023-09-20: qty 10

## 2023-09-20 MED ORDER — IRON SUCROSE 20 MG/ML IV SOLN
200.0000 mg | Freq: Once | INTRAVENOUS | Status: AC
  Administered 2023-09-20: 200 mg via INTRAVENOUS

## 2023-09-22 ENCOUNTER — Inpatient Hospital Stay

## 2023-09-22 VITALS — BP 159/99 | HR 75 | Temp 96.5°F | Resp 17

## 2023-09-22 DIAGNOSIS — D509 Iron deficiency anemia, unspecified: Secondary | ICD-10-CM

## 2023-09-22 MED ORDER — IRON SUCROSE 20 MG/ML IV SOLN
200.0000 mg | Freq: Once | INTRAVENOUS | Status: AC
  Administered 2023-09-22: 200 mg via INTRAVENOUS
  Filled 2023-09-22: qty 10

## 2023-09-22 MED ORDER — SODIUM CHLORIDE 0.9% FLUSH
10.0000 mL | Freq: Once | INTRAVENOUS | Status: AC | PRN
  Administered 2023-09-22: 10 mL
  Filled 2023-09-22: qty 10

## 2023-09-22 NOTE — Progress Notes (Signed)
Patient tolerated Venofer infusion well. Explained recommendation of 30 min post monitoring. Patient refused to wait post monitoring. Educated on what signs to watch for & to call with any concerns. No questions, discharged. Stable  

## 2023-09-22 NOTE — Patient Instructions (Signed)

## 2023-09-23 ENCOUNTER — Encounter

## 2023-09-23 ENCOUNTER — Other Ambulatory Visit: Payer: Self-pay | Admitting: *Deleted

## 2023-09-23 ENCOUNTER — Inpatient Hospital Stay
Admission: RE | Admit: 2023-09-23 | Discharge: 2023-09-23 | Disposition: A | Discharge status: Home / Self Care | Payer: Self-pay | Source: Ambulatory Visit | Attending: Infectious Diseases | Admitting: Infectious Diseases

## 2023-09-23 DIAGNOSIS — Z1231 Encounter for screening mammogram for malignant neoplasm of breast: Secondary | ICD-10-CM

## 2023-09-24 NOTE — Addendum Note (Signed)
 Addended byKatina Parlor on: 09/24/2023 10:57 AM   Modules accepted: Orders

## 2023-09-27 ENCOUNTER — Inpatient Hospital Stay

## 2023-09-27 VITALS — BP 152/78 | HR 82 | Temp 97.8°F | Resp 18

## 2023-09-27 DIAGNOSIS — D509 Iron deficiency anemia, unspecified: Secondary | ICD-10-CM

## 2023-09-27 MED ORDER — IRON SUCROSE 20 MG/ML IV SOLN
200.0000 mg | Freq: Once | INTRAVENOUS | Status: AC
  Administered 2023-09-27: 200 mg via INTRAVENOUS
  Filled 2023-09-27: qty 10

## 2023-09-27 MED ORDER — SODIUM CHLORIDE 0.9% FLUSH
10.0000 mL | Freq: Once | INTRAVENOUS | Status: DC | PRN
Start: 2023-09-27 — End: 2023-09-27
  Filled 2023-09-27: qty 10

## 2023-09-28 ENCOUNTER — Encounter: Payer: Self-pay | Admitting: Gastroenterology

## 2023-09-29 ENCOUNTER — Ambulatory Visit
Admission: RE | Admit: 2023-09-29 | Discharge: 2023-09-29 | Disposition: A | Discharge status: Home / Self Care | Source: Ambulatory Visit | Attending: Physician Assistant | Admitting: Physician Assistant

## 2023-09-29 DIAGNOSIS — N200 Calculus of kidney: Secondary | ICD-10-CM

## 2023-10-08 NOTE — Telephone Encounter (Signed)
 Patient called office today and requested to schedule a consult with Dr. Cherylene Corrente prior to scheduling surgery. He doesn't have any appointments available until 11/08/23. She is requesting a phone call from Kathreen Pare to discuss in further detail before making a decision about surgery.

## 2023-10-13 ENCOUNTER — Telehealth: Payer: Self-pay | Admitting: Physician Assistant

## 2023-10-13 DIAGNOSIS — N200 Calculus of kidney: Secondary | ICD-10-CM

## 2023-10-13 NOTE — Telephone Encounter (Signed)
 I just spoke with the patient via telephone.  We discussed her recent CT stone study results, namely bilateral nonobstructing renal stones, L>R.  Her right-sided stones are rather small and I do not recommend elective treatment for these at this time.  I did recommend a left ureteroscopy with laser lithotripsy and stent placement with Dr. Twylla on an elective basis and she agreed. OR orders in.  Contributors to her stone disease include Roux-en-Y gastric bypass and medullary sponge kidney.  She had a remote Litholink in 2022 at [facility], but I am not able to review the original report.  I recommend repeat Litholink about a month after her ureteroscopy and she would like to do this.

## 2023-10-14 ENCOUNTER — Other Ambulatory Visit: Payer: Self-pay

## 2023-10-14 DIAGNOSIS — N200 Calculus of kidney: Secondary | ICD-10-CM

## 2023-10-14 NOTE — Progress Notes (Signed)
 Surgical Physician Order Form [facility] [place]  Dr. Twylla * Scheduling expectation : Next Available  *Length of Case:   *Clearance needed: no  *Anticoagulation Instructions: N/A  *Aspirin Instructions: N/A  *Post-op visit Date/Instructions:  TBD  *Diagnosis: Left Nephrolithiasis  *Procedure: left Ureteroscopy w/laser lithotripsy & stent placement ([id number])   Additional orders: N/A  -Admit type: OUTpatient  -Anesthesia: General  -VTE Prophylaxis Standing Order SCD's       Other:   -Standing Lab Orders Per Anesthesia    Lab other: UA&Urine Culture  -Standing Test orders EKG/Chest x-ray per Anesthesia       Test other:   - Medications:  Ancef  2gm IV  -Other orders:  N/A

## 2023-10-14 NOTE — H&P (Signed)
 Pre-Procedure H&P   Patient ID: Megan [name] is a 63 y.o. female.  Gastroenterology Provider: Elspeth Ozell Jungling, DO  Referring Provider: Jonette Primmer, PA PCP: Epifanio Alm SQUIBB, MD  Date: 10/15/2023  HPI Megan [name] is a 63 y.o. female who presents today for Esophagogastroduodenoscopy and Colonoscopy for Left upper quadrant pain, marginal ulcer, dumping syndrome, iron  deficiency anemia, alternating diarrhea and constipation .  Patient has been experiencing left upper quadrant pain.  She is status post Roux-en-Y bypass in 2021 and a vagotomy in 2022.  History of marginal ulcer back in April 2022.  This was healed on repeat EGD in July 2022 and January 2024.  Biopsies negative for H. pylori gastritis.  Thyroid testing was positive x 2 with good correlation with symptoms.  Currently on Protonix .  She is status post spinal cord stimulator  CT in May was negative for any acute GI findings  Ferritin 4 iron  saturation 9% TIBC 539 hemoglobin 11.6 MCV 87.5 platelets 288,000 creatinine 0.6  Has never previously undergone colonoscopy  No family history of colon cancer or colon polyp   Past Medical History:  Diagnosis Date   Anemia    Arthritis    Asthma    CSR (central serous retinopathy), left    DDD (degenerative disc disease), lumbosacral    DVT (deep venous thrombosis) (HCC) 2008   left femeral s/p femeral osteotomy   Dyspnea    Facet syndrome, lumbar    Family history of adverse reaction to anesthesia    mother had reaction but doesn't know what it is   GERD (gastroesophageal reflux disease)    Heart murmur    History of kidney stones    Hypertension    Hypothyroidism    IDA (iron  deficiency anemia)    Lumbar foraminal stenosis    Malfunction of spinal cord stimulator, sequela    Medullary sponge kidney    Pneumonia    Polycystic ovary    PONV (postoperative nausea and vomiting)    Primary osteoarthritis of left knee    Pseudophakia of both  eyes    Renal disorder    Thyroid disease    Trigeminal neuralgia     Past Surgical History:  Procedure Laterality Date   ABDOMINAL HYSTERECTOMY     APPENDECTOMY  1980   CATARACT EXTRACTION Bilateral    CYSTOSCOPY/URETEROSCOPY/HOLMIUM LASER/STENT PLACEMENT Right 06/29/2019   Procedure: CYSTOSCOPY/URETEROSCOPY/HOLMIUM LASER/STENT PLACEMENT;  Surgeon: Penne Knee, MD;  Location: [facility];  Service: Urology;  Laterality: Right;   CYSTOSCOPY/URETEROSCOPY/HOLMIUM LASER/STENT PLACEMENT  07/15/2020   CYSTOSCOPY/URETEROSCOPY/HOLMIUM LASER/STENT PLACEMENT Bilateral 12/01/2021   Procedure: CYSTOSCOPY/URETEROSCOPY/HOLMIUM LASER/STENT PLACEMENT;  Surgeon: Penne Knee, MD;  Location: [facility];  Service: Urology;  Laterality: Bilateral;   ESOPHAGOGASTRODUODENOSCOPY     2021, 08/16/2020, 11/13/2020   FEMORAL OSTEOTOMY W/ APPLICATION EXTERNAL FIXATOR Right    KNEE ARTHROSCOPY W/ ACL RECONSTRUCTION Left    KNEE ARTHROSCOPY W/ HARDWARE REMOVAL Right 01/13/2017   LAPAROSCOPIC GASTRIC BYPASS  02/12/2020   LITHOTRIPSY     LUMBAR LAMINECTOMY/DECOMPRESSION MICRODISCECTOMY Right 01/22/2021   Procedure: L4-5 DECOMPRESSION;  Surgeon: Clois Fret, MD;  Location: [facility];  Service: Neurosurgery;  Laterality: Right;   LUMBAR SPINAL CORD SIMULATOR REVISION N/A 12/02/2022   Procedure: REVISION OF SPINAL CORD STIMULATOR ([employer]);  Surgeon: Clois Fret, MD;  Location: [facility];  Service: Neurosurgery;  Laterality: N/A;   OSTEOTOMY Bilateral 2017   femoral shaft/supracondylar   PULSE GENERATOR IMPLANT N/A 09/03/2021   Procedure: PULSE GENERATOR IMPLANT;  Surgeon:  Clois Fret, MD;  Location: [facility];  Service: Neurosurgery;  Laterality: N/A;   ROTATOR CUFF REPAIR Right    THORACIC LAMINECTOMY FOR SPINAL CORD STIMULATOR N/A 09/03/2021   Procedure: THORACIC LAMINECTOMY FOR SPINAL CORD STIMULATOR PLACEMENT ([employer]);  Surgeon: Clois Fret, MD;  Location: [facility];   Service: Neurosurgery;  Laterality: N/A;   TONSILLECTOMY  1964   TOTAL KNEE ARTHROPLASTY Right 11/11/2018   total knee arthroplasty,      Family History No h/o GI disease or malignancy  Review of Systems  Constitutional:  Negative for activity change, appetite change, chills, diaphoresis, fatigue, fever and unexpected weight change.  HENT:  Negative for trouble swallowing and voice change.   Respiratory:  Negative for shortness of breath and wheezing.   Cardiovascular:  Negative for chest pain, palpitations and leg swelling.  Gastrointestinal:  Positive for abdominal pain, constipation and diarrhea. Negative for abdominal distention, anal bleeding, blood in stool, nausea, rectal pain and vomiting.  Musculoskeletal:  Negative for arthralgias and myalgias.  Skin:  Negative for color change and pallor.  Neurological:  Negative for dizziness, syncope and weakness.  Psychiatric/Behavioral:  Negative for confusion.   All other systems reviewed and are negative.    Medications No current facility-administered medications on file prior to encounter.   Current Outpatient Medications on File Prior to Encounter  Medication Sig Dispense Refill   Biotin 5000 MCG TABS Take 1 tablet by mouth daily.     carbamazepine  (TEGRETOL ) 200 MG tablet Take 200 mg by mouth at bedtime. And PRN     Cholecalciferol  25 MCG (1000 UT) tablet Take 2,000 Units by mouth daily.     fluticasone  (FLONASE ) 50 MCG/ACT nasal spray Place 2 sprays into both nostrils daily.     gabapentin  (NEURONTIN ) 300 MG capsule Take 900 mg by mouth at bedtime. And PRN     levalbuterol  (XOPENEX  HFA) 45 MCG/ACT inhaler Inhale 2 puffs into the lungs every 4 (four) hours as needed for wheezing.     levothyroxine  (SYNTHROID ) 50 MCG tablet Take 50 mcg by mouth daily before breakfast.     losartan  (COZAAR ) 50 MG tablet Take 100 mg by mouth at bedtime.     montelukast  (SINGULAIR ) 10 MG tablet Take 10 mg by mouth at bedtime.     Multiple Vitamin  (MULTIVITAMIN ADULT PO) Take 1 tablet by mouth daily.     pantoprazole  (PROTONIX ) 40 MG tablet Take 40 mg by mouth 3 (three) times daily.     tiZANidine  (ZANAFLEX ) 2 MG tablet TAKE 1-2 TABLETS (2-4 MG TOTAL) BY MOUTH EVERY 8 (EIGHT) HOURS AS NEEDED FOR MUSCLE SPASMS. 360 tablet 0   TRELEGY ELLIPTA 200-62.5-25 MCG/ACT AEPB Inhale 1 puff into the lungs daily.     acetaminophen  (TYLENOL ) 325 MG tablet Take 2 tablets (650 mg total) by mouth every 4 (four) hours as needed for mild pain ((score 1 to 3) or temp > 100.5). 30 tablet 0   budesonide  (PULMICORT ) 0.5 MG/2ML nebulizer solution Take 0.5 mg by nebulization daily as needed (shortness of breath).     oxyCODONE  (ROXICODONE ) 5 MG immediate release tablet Take 1 tablet (5 mg total) by mouth every 6 (six) hours as needed for severe pain. 20 tablet 0   senna (SENOKOT) 8.6 MG TABS tablet Take 1 tablet (8.6 mg total) by mouth daily as needed for mild constipation. 30 tablet 0    Pertinent medications related to GI and procedure were reviewed by me with the patient prior to the procedure  Current Facility-Administered Medications:    0.9 %  sodium chloride  infusion, , Intravenous, Continuous, Onita Elspeth Sharper, DO, Last Rate: 20 mL/hr at 10/15/23 0731, New Bag at 10/15/23 0731  sodium chloride  20 mL/hr at 10/15/23 9268       Allergies  Allergen Reactions   Hydrocodone-Acetaminophen  Shortness Of Breath    Headache, Vomiting, nightmares. Pt is able to take acetaminophen .     Latex Rash and Other (See Comments)    Blisters      Morphine  Anaphylaxis, Shortness Of Breath, Nausea And Vomiting, Swelling and Other (See Comments)    Has and facial swelling    Nickel Swelling and Rash   Petrolatum Rash and Other (See Comments)    Blister All petroleum products per patient, facial swelling    Tape Rash    Pt can use paper tape Blisters, tegaderm okay Prefers paper tape   Erythromycin Nausea And Vomiting   Hydromorphone  Hcl Nausea And  Vomiting    Bad headache   Nsaids     Avoid due to bariatric surgery   Other Other (See Comments)    Generic medications per patient- redness and burning in gums    Silicone Dermatitis    Including Tegraderm- Rash & Blisters,   Prefers paper tape   Sulfa Antibiotics Rash        Tegaderm Ag Mesh [Silver] Rash   Allergies were reviewed by me prior to the procedure  Objective   Body mass index is 23.17 kg/m. Vitals:   10/15/23 0712  BP: (!) 188/88  Pulse: 63  Resp: 16  Temp: (!) 96.6 F (35.9 C)  TempSrc: Temporal  SpO2: 100%  Weight: 59.3 kg  Height: 5' 3 (1.6 m)     Physical Exam Vitals and nursing note reviewed.  Constitutional:      General: She is not in acute distress.    Appearance: Normal appearance. She is not ill-appearing, toxic-appearing or diaphoretic.  HENT:     Head: Normocephalic and atraumatic.     Nose: Nose normal.     Mouth/Throat:     Mouth: Mucous membranes are moist.     Pharynx: Oropharynx is clear.   Eyes:     General: No scleral icterus.    Extraocular Movements: Extraocular movements intact.    Cardiovascular:     Rate and Rhythm: Normal rate and regular rhythm.     Heart sounds: Normal heart sounds. No murmur heard.    No friction rub. No gallop.  Pulmonary:     Effort: Pulmonary effort is normal. No respiratory distress.     Breath sounds: Normal breath sounds. No wheezing, rhonchi or rales.  Abdominal:     General: Bowel sounds are normal. There is no distension.     Palpations: Abdomen is soft.     Tenderness: There is no abdominal tenderness. There is no guarding or rebound.   Musculoskeletal:     Cervical back: Neck supple.     Right lower leg: No edema.     Left lower leg: No edema.   Skin:    General: Skin is warm and dry.     Coloration: Skin is not jaundiced or pale.   Neurological:     General: No focal deficit present.     Mental Status: She is alert and oriented to person, place, and time. Mental status  is at baseline.   Psychiatric:        Mood and Affect: Mood normal.  Behavior: Behavior normal.        Thought Content: Thought content normal.        Judgment: Judgment normal.      Assessment:  Ms. Josslynn Mentzer is a 63 y.o. female  who presents today for Esophagogastroduodenoscopy and Colonoscopy for Left upper quadrant pain, marginal ulcer, dumping syndrome, iron  deficiency anemia, alternating diarrhea and constipation .  Plan:  Esophagogastroduodenoscopy and Colonoscopy with possible intervention today  Esophagogastroduodenoscopy and Colonoscopy with possible biopsy, control of bleeding, polypectomy, and interventions as necessary has been discussed with the patient/patient representative. Informed consent was obtained from the patient/patient representative after explaining the indication, nature, and risks of the procedure including but not limited to death, bleeding, perforation, missed neoplasm/lesions, cardiorespiratory compromise, and reaction to medications. Opportunity for questions was given and appropriate answers were provided. Patient/patient representative has verbalized understanding is amenable to undergoing the procedure.   Elspeth Ozell Jungling, DO  [facility]  Portions of the record may have been created with voice recognition software. Occasional wrong-word or 'sound-a-like' substitutions may have occurred due to the inherent limitations of voice recognition software.  Read the chart carefully and recognize, using context, where substitutions may have occurred.

## 2023-10-15 ENCOUNTER — Other Ambulatory Visit: Payer: Self-pay

## 2023-10-15 ENCOUNTER — Ambulatory Visit: Admitting: Certified Registered"

## 2023-10-15 ENCOUNTER — Encounter
Admission: RE | Disposition: A | Discharge status: Home / Self Care | Payer: Self-pay | Source: Home / Self Care | Attending: Gastroenterology

## 2023-10-15 ENCOUNTER — Encounter: Payer: Self-pay | Admitting: Gastroenterology

## 2023-10-15 ENCOUNTER — Ambulatory Visit
Admission: RE | Admit: 2023-10-15 | Discharge: 2023-10-15 | Disposition: A | Discharge status: Home / Self Care | Attending: Gastroenterology | Admitting: Gastroenterology

## 2023-10-15 DIAGNOSIS — R1012 Left upper quadrant pain: Secondary | ICD-10-CM | POA: Insufficient documentation

## 2023-10-15 DIAGNOSIS — D509 Iron deficiency anemia, unspecified: Secondary | ICD-10-CM | POA: Insufficient documentation

## 2023-10-15 DIAGNOSIS — K317 Polyp of stomach and duodenum: Secondary | ICD-10-CM | POA: Diagnosis not present

## 2023-10-15 DIAGNOSIS — Z9682 Presence of neurostimulator: Secondary | ICD-10-CM | POA: Insufficient documentation

## 2023-10-15 DIAGNOSIS — K2950 Unspecified chronic gastritis without bleeding: Secondary | ICD-10-CM | POA: Insufficient documentation

## 2023-10-15 DIAGNOSIS — D122 Benign neoplasm of ascending colon: Secondary | ICD-10-CM | POA: Insufficient documentation

## 2023-10-15 DIAGNOSIS — D120 Benign neoplasm of cecum: Secondary | ICD-10-CM | POA: Insufficient documentation

## 2023-10-15 DIAGNOSIS — Y832 Surgical operation with anastomosis, bypass or graft as the cause of abnormal reaction of the patient, or of later complication, without mention of misadventure at the time of the procedure: Secondary | ICD-10-CM | POA: Insufficient documentation

## 2023-10-15 DIAGNOSIS — Z9884 Bariatric surgery status: Secondary | ICD-10-CM | POA: Insufficient documentation

## 2023-10-15 DIAGNOSIS — Z79899 Other long term (current) drug therapy: Secondary | ICD-10-CM | POA: Insufficient documentation

## 2023-10-15 DIAGNOSIS — R197 Diarrhea, unspecified: Secondary | ICD-10-CM | POA: Insufficient documentation

## 2023-10-15 DIAGNOSIS — K911 Postgastric surgery syndromes: Secondary | ICD-10-CM | POA: Insufficient documentation

## 2023-10-15 DIAGNOSIS — Z8711 Personal history of peptic ulcer disease: Secondary | ICD-10-CM | POA: Insufficient documentation

## 2023-10-15 DIAGNOSIS — K5900 Constipation, unspecified: Secondary | ICD-10-CM | POA: Insufficient documentation

## 2023-10-15 HISTORY — PX: POLYPECTOMY: SHX149

## 2023-10-15 HISTORY — PX: ESOPHAGOGASTRODUODENOSCOPY: SHX5428

## 2023-10-15 HISTORY — PX: COLONOSCOPY: SHX5424

## 2023-10-15 HISTORY — PX: HEMOSTASIS CLIP PLACEMENT: SHX6857

## 2023-10-15 HISTORY — DX: Iron deficiency anemia, unspecified: D50.9

## 2023-10-15 SURGERY — COLONOSCOPY
Anesthesia: General

## 2023-10-15 MED ORDER — SODIUM CHLORIDE 0.9 % IV SOLN: INTRAVENOUS | Status: DC

## 2023-10-15 MED ORDER — LIDOCAINE HCL (PF) 2 % IJ SOLN
INTRAMUSCULAR | Status: DC | PRN
  Administered 2023-10-15: 100 mg via INTRADERMAL

## 2023-10-15 MED ORDER — PROPOFOL 500 MG/50ML IV EMUL
INTRAVENOUS | Status: DC | PRN
  Administered 2023-10-15: 200150 ug/kg/min via INTRAVENOUS
  Administered 2023-10-15: 100 mg via INTRAVENOUS

## 2023-10-15 MED ORDER — LIDOCAINE HCL (PF) 2 % IJ SOLN
INTRAMUSCULAR | Status: AC
  Filled 2023-10-15: qty 5

## 2023-10-15 MED ORDER — LIDOCAINE HCL (PF) 2 % IJ SOLN
INTRAMUSCULAR | Status: AC
Start: 2023-10-15 — End: 2023-10-15
  Filled 2023-10-15: qty 10

## 2023-10-15 MED ORDER — PROPOFOL 1000 MG/100ML IV EMUL
INTRAVENOUS | Status: AC
  Filled 2023-10-15: qty 200

## 2023-10-15 NOTE — Op Note (Signed)
 [facility] Patient Name: Megan [name] Procedure Date: 10/15/2023 7:45 AM MRN: [id number] Account #: [id number] Date of Birth: 03/06/1961 Admit Type: Outpatient Age: 63 Room: [facility] ENDO ROOM 1 Gender: Female Note Status: Finalized Instrument Name: Upper Endoscope [id number] Procedure:             Upper GI endoscopy Indications:           Abdominal pain in the left upper quadrant, Iron                          deficiency anemia, marginal ulcer, alternating                         diarrhea and constipation Providers:             Elspeth Ozell Onita ROSALEA, DO Referring MD:          Elspeth Ozell Onita DO, DO (Referring MD) Medicines:             Monitored Anesthesia Care Complications:         No immediate complications. Estimated blood loss:                         Minimal. Procedure:             Pre-Anesthesia Assessment:                        - Prior to the procedure, a History and Physical was                         performed, and patient medications and allergies were                         reviewed. The patient is competent. The risks and                         benefits of the procedure and the sedation options and                         risks were discussed with the patient. All questions                         were answered and informed consent was obtained.                         Patient identification and proposed procedure were                         verified by the physician, the nurse, the anesthetist                         and the technician in the endoscopy suite. Mental                         Status Examination: alert and oriented. Airway                         Examination: normal oropharyngeal airway and neck  mobility. Respiratory Examination: clear to                         auscultation. CV Examination: RRR, no murmurs, no S3                         or S4. Prophylactic Antibiotics: The patient does not                          require prophylactic antibiotics. Prior                         Anticoagulants: The patient has taken no anticoagulant                         or antiplatelet agents. ASA Grade Assessment: II - A                         patient with mild systemic disease. After reviewing                         the risks and benefits, the patient was deemed in                         satisfactory condition to undergo the procedure. The                         anesthesia plan was to use monitored anesthesia care                         (MAC). Immediately prior to administration of                         medications, the patient was re-assessed for adequacy                         to receive sedatives. The heart rate, respiratory                         rate, oxygen saturations, blood pressure, adequacy of                         pulmonary ventilation, and response to care were                         monitored throughout the procedure. The physical                         status of the patient was re-assessed after the                         procedure.                        After obtaining informed consent, the endoscope was                         passed under direct vision. Throughout the procedure,  the patient's blood pressure, pulse, and oxygen                         saturations were monitored continuously. The                         Endosonoscope was introduced through the mouth, and                         advanced to the jejunum. The upper GI endoscopy was                         accomplished without difficulty. The patient tolerated                         the procedure well. Findings:      The examined esophagus was normal. Estimated blood loss: none.      Esophagogastric landmarks were identified: the gastroesophageal junction       was found at 40 cm from the incisors.      The Z-line was regular. Estimated blood loss: none.      Evidence of  a Roux-en-Y anastomosis was found in the entire examined       stomach. This was characterized by healthy appearing mucosa. Estimated       blood loss: none.      Two 1 to 3 mm sessile polyps with no bleeding and no stigmata of recent       bleeding were found in the gastric body. The polyp was removed with a       jumbo cold forceps. Resection and retrieval were complete. Estimated       blood loss was minimal.      Stomach retroflexion not performed given small gastric pouch, Estimated       blood loss: none.      Normal mucosa was found in the jejunum. Biopsies were taken with a cold       forceps for histology. Estimated blood loss was minimal. Impression:            - Normal esophagus.                        - Esophagogastric landmarks identified.                        - Z-line regular.                        - A Roux-en-Y anastomosis was found, characterized by                         healthy appearing mucosa.                        - Two gastric polyps. Resected and retrieved.                        - Normal mucosa was found in the jejunum. Biopsied. Recommendation:        - Patient has a contact number available for                         emergencies.  The signs and symptoms of potential                         delayed complications were discussed with the patient.                         Return to normal activities tomorrow. Written                         discharge instructions were provided to the patient.                        - Discharge patient to home.                        - Resume previous diet, small frequent meals. separate                         liquids and solids by 17m.                        - Continue present medications.                        - Await pathology results.                        - Return to GI clinic at appointment to be scheduled.                        - Call the office at [phone] to schedule follow                         up with Jonette Primmer, PA                        - The findings and recommendations were discussed with                         the patient. Procedure Code(s):     --- Professional ---                        [phone], Esophagogastroduodenoscopy, flexible,                         transoral; with biopsy, single or multiple Diagnosis Code(s):     --- Professional ---                        Z98.84, Bariatric surgery status                        K31.7, Polyp of stomach and duodenum                        R10.12, Left upper quadrant pain                        D50.9, Iron  deficiency anemia, unspecified CPT copyright 2022 American Medical Association. All rights reserved. The codes documented in this report are preliminary and upon coder review may  be revised to meet current compliance requirements. Attending Participation:      I personally performed the entire procedure. Elspeth Jungling, DO Elspeth Ozell Jungling DO, DO 10/15/2023 8:29:24 AM This report has been signed electronically. Number of Addenda: 0 Note Initiated On: 10/15/2023 7:45 AM Estimated Blood Loss:  Estimated blood loss was minimal.      [facility]

## 2023-10-15 NOTE — Anesthesia Preprocedure Evaluation (Signed)
 Anesthesia Evaluation  Patient identified by MRN, date of birth, ID band Patient awake    Reviewed: Allergy & Precautions, H&P , NPO status , Patient's Chart, lab work & pertinent test results  History of Anesthesia Complications (+) PONV and history of anesthetic complications  Airway Mallampati: II  TM Distance: >3 FB Neck ROM: full    Dental no notable dental hx.    Pulmonary asthma  Asthma -moderate-persistent-controlled    Pulmonary exam normal        Cardiovascular Exercise Tolerance: Good hypertension, Normal cardiovascular exam  >4 METS (Limited by chronic back pain.  ECHO 01/20/22 EF>55% INTERPRETATION NORMAL LEFT VENTRICULAR SYSTOLIC FUNCTION NORMAL RIGHT VENTRICULAR SYSTOLIC FUNCTION MILD VALVULAR REGURGITATION (See above) NO VALVULAR STENOSIS   EKG 11/22/19: Normal sinus rhythm  DVT (LLE DVT in 2016 treated with OAC at the time)   Neuro/Psych         Pain Chronic pain: : musculoskeletal, neuropathic Opioid tolerance  Neuropathy (trigeminal neuralgia managed with tegratol)  Spinal cord stimulator placed on 09/03/21-  Malfunction of spinal cord stimulator, sequela DDD s/p L4-L5 decompression on 01/2021  Neuromuscular disease    GI/Hepatic Neg liver ROS,GERD  ,,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   Endo/Other  Hypothyroidism    Renal/GU Renal InsufficiencyRenal disease     Musculoskeletal  (+) Arthritis  ((s/p R TKA)),    Abdominal Normal abdominal exam  (+)   Peds  Hematology negative hematology ROS (+)   Anesthesia Other Findings Past Medical History: No date: Anemia No date: Arthritis No date: Asthma No date: CSR (central serous retinopathy), left No date: DDD (degenerative disc disease), lumbosacral 2008: DVT (deep venous thrombosis) (HCC)     Comment:  left femeral s/p femeral osteotomy No date: Dyspnea No date: Facet syndrome, lumbar No date: Family history of  adverse reaction to anesthesia     Comment:  mother had reaction but doesn't know what it is No date: GERD (gastroesophageal reflux disease) No date: Heart murmur No date: History of kidney stones No date: Hypertension No date: Hypothyroidism No date: Lumbar foraminal stenosis No date: Malfunction of spinal cord stimulator, 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 No date: Pneumonia No date: Polycystic ovary No date: PONV (postoperative nausea and vomiting) No date: Primary osteoarthritis of left knee No date: Pseudophakia of both eyes No date: Renal disorder No date: Thyroid disease No date: Trigeminal neuralgia  Past Surgical History: No date: ABDOMINAL HYSTERECTOMY 1980: APPENDECTOMY 06/29/2019: CYSTOSCOPY/URETEROSCOPY/HOLMIUM LASER/STENT PLACEMENT;  Right     Comment:  Procedure: CYSTOSCOPY/URETEROSCOPY/HOLMIUM LASER/STENT               PLACEMENT;  Surgeon: Penne Knee, MD;  Location: [facility]              [facility];  Service: Urology;  Laterality: Right; 07/15/2020: CYSTOSCOPY/URETEROSCOPY/HOLMIUM LASER/STENT PLACEMENT 12/01/2021: CYSTOSCOPY/URETEROSCOPY/HOLMIUM LASER/STENT PLACEMENT;  Bilateral     Comment:  Procedure: CYSTOSCOPY/URETEROSCOPY/HOLMIUM LASER/STENT               PLACEMENT;  Surgeon: Penne Knee, MD;  Location: [facility]              ORS;  Service: Urology;  Laterality: Bilateral; No date: ESOPHAGOGASTRODUODENOSCOPY     Comment:  2021, 08/16/2020, 11/13/2020 No date: FEMORAL OSTEOTOMY W/ APPLICATION EXTERNAL FIXATOR; Right No date: KNEE ARTHROSCOPY W/ ACL RECONSTRUCTION; Left 01/13/2017: KNEE ARTHROSCOPY W/ HARDWARE REMOVAL; Right 02/12/2020: LAPAROSCOPIC GASTRIC BYPASS No date: LITHOTRIPSY 01/22/2021: LUMBAR LAMINECTOMY/DECOMPRESSION MICRODISCECTOMY; Right     Comment:  Procedure: L4-5 DECOMPRESSION;  Surgeon:  Clois Fret, MD;  Location: [facility];  Service: Neurosurgery;              Laterality: Right; 2017: OSTEOTOMY; Bilateral      Comment:  femoral shaft/supracondylar 09/03/2021: PULSE GENERATOR IMPLANT; N/A     Comment:  Procedure: PULSE GENERATOR IMPLANT;  Surgeon: Clois Fret, MD;  Location: [facility] OR[facility];  Service: Neurosurgery;              Laterality: N/A; No date: ROTATOR CUFF REPAIR; Right 09/03/2021: THORACIC LAMINECTOMY FOR SPINAL CORD STIMULATOR; N/A     Comment:  Procedure: THORACIC LAMINECTOMY FOR SPINAL CORD               STIMULATOR PLACEMENT ([employer]);  Surgeon:               Clois Fret, MD;  Location: [facility];  Service:               Neurosurgery;  Laterality: N/A; 1964: TONSILLECTOMY 11/11/2018: TOTAL KNEE ARTHROPLASTY; Right No date: total knee arthroplasty,     Reproductive/Obstetrics negative OB ROS                             Anesthesia Physical Anesthesia Plan  ASA: 2  Anesthesia Plan: General   Post-op Pain Management: Minimal or no pain anticipated   Induction: Intravenous  PONV Risk Score and Plan: 3 and Propofol  infusion, TIVA and Ondansetron   Airway Management Planned: Nasal Cannula  Additional Equipment: None  Intra-op Plan:   Post-operative Plan:   Informed Consent: I have reviewed the patients History and Physical, chart, labs and discussed the procedure including the risks, benefits and alternatives for the proposed anesthesia with the patient or authorized representative who has indicated his/her understanding and acceptance.     Dental advisory given  Plan Discussed with: CRNA and Surgeon  Anesthesia Plan Comments: (Discussed risks of anesthesia with patient, including possibility of difficulty with spontaneous ventilation under anesthesia necessitating airway intervention, PONV, and rare risks such as cardiac or respiratory or neurological events, and allergic reactions. Discussed the role of CRNA in patient's perioperative care. Patient understands.)        Anesthesia Quick Evaluation

## 2023-10-15 NOTE — Transfer of Care (Signed)
 Immediate Anesthesia Transfer of Care Note  Patient: Megan [name]  Procedure(s) Performed: COLONOSCOPY EGD (ESOPHAGOGASTRODUODENOSCOPY) POLYPECTOMY, INTESTINE CONTROL OF HEMORRHAGE, GI TRACT, ENDOSCOPIC, BY CLIPPING OR OVERSEWING  Patient Location: PACU  Anesthesia Type:General  Level of Consciousness: sedated  Airway & Oxygen Therapy: Patient Spontanous Breathing  Post-op Assessment: Report given to RN and Post -op Vital signs reviewed and stable  Post vital signs: Reviewed and stable  Last Vitals:  Vitals Value Taken Time  BP 127/78 10/15/23 08:28  Temp    Pulse 71 10/15/23 08:29  Resp 21 10/15/23 08:29  SpO2 100 % 10/15/23 08:29  Vitals shown include unfiled device data.  Last Pain:  Vitals:   10/15/23 0828  TempSrc:   PainSc: Asleep         Complications: There were no known notable events for this encounter.

## 2023-10-15 NOTE — Anesthesia Postprocedure Evaluation (Signed)
 Anesthesia Post Note  Patient: Megan [name]  Procedure(s) Performed: COLONOSCOPY EGD (ESOPHAGOGASTRODUODENOSCOPY) POLYPECTOMY, INTESTINE CONTROL OF HEMORRHAGE, GI TRACT, ENDOSCOPIC, BY CLIPPING OR OVERSEWING  Patient location during evaluation: Endoscopy Anesthesia Type: General Level of consciousness: awake and alert Pain management: pain level controlled Vital Signs Assessment: post-procedure vital signs reviewed and stable Respiratory status: spontaneous breathing, nonlabored ventilation, respiratory function stable and patient connected to nasal cannula oxygen Cardiovascular status: blood pressure returned to baseline and stable Postop Assessment: no apparent nausea or vomiting Anesthetic complications: no   There were no known notable events for this encounter.   Last Vitals:  Vitals:   10/15/23 0838 10/15/23 0848  BP: (!) 150/82 (!) 173/91  Pulse: 68 62  Resp: 19 12  Temp:    SpO2: 100% 100%    Last Pain:  Vitals:   10/15/23 0848  TempSrc:   PainSc: 0-No pain                 Rome Ade

## 2023-10-15 NOTE — Interval H&P Note (Signed)
 History and Physical Interval Note: Preprocedure H&P from 10/15/23  was reviewed and there was no interval change after seeing and examining the patient.  Written consent was obtained from the patient after discussion of risks, benefits, and alternatives. 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  The various methods of treatment have been discussed with the patient and family. After consideration of risks, benefits and other options for treatment, the patient has consented to  Procedure(s) with comments: COLONOSCOPY (N/A) - Pain syndrome, chronic  Spinal cord stimulator status EGD (ESOPHAGOGASTRODUODENOSCOPY) (N/A) as a surgical intervention.  The patient's history has been reviewed, patient examined, no change in status, stable for surgery.  I have reviewed the patient's chart and labs.  Questions were answered to the patient's satisfaction.     Elspeth Ozell Jungling

## 2023-10-15 NOTE — Op Note (Signed)
 [facility] Gastroenterology Patient Name: Megan [name] Procedure Date: 10/15/2023 7:56 AM MRN: [id number] Account #: [id number] Date of Birth: [birth year] Admit Type: Outpatient Age: 63 Room: [facility] ENDO ROOM 1 Gender: Female Note Status: Supervisor Override Instrument Name: Arvis [id number] Procedure:             Colonoscopy Indications:           Iron  deficiency anemia, Constipation, Diarrhea Providers:             Elspeth Ozell Onita ROSALEA, DO Referring MD:          Elspeth Ozell Onita DO, DO (Referring MD) Medicines:             Monitored Anesthesia Care Complications:         No immediate complications. Estimated blood loss:                         Minimal. Procedure:             Pre-Anesthesia Assessment:                        - Prior to the procedure, a History and Physical was                         performed, and patient medications and allergies were                         reviewed. The patient is competent. The risks and                         benefits of the procedure and the sedation options and                         risks were discussed with the patient. All questions                         were answered and informed consent was obtained.                         Patient identification and proposed procedure were                         verified by the physician, the nurse, the anesthetist                         and the technician in the endoscopy suite. Mental                         Status Examination: alert and oriented. Airway                         Examination: normal oropharyngeal airway and neck                         mobility. Respiratory Examination: clear to                         auscultation. CV Examination: RRR, no murmurs, no S3  or S4. Prophylactic Antibiotics: The patient does not                         require prophylactic antibiotics. Prior                         Anticoagulants: The patient has  taken no anticoagulant                         or antiplatelet agents. ASA Grade Assessment: II - A                         patient with mild systemic disease. After reviewing                         the risks and benefits, the patient was deemed in                         satisfactory condition to undergo the procedure. The                         anesthesia plan was to use monitored anesthesia care                         (MAC). Immediately prior to administration of                         medications, the patient was re-assessed for adequacy                         to receive sedatives. The heart rate, respiratory                         rate, oxygen saturations, blood pressure, adequacy of                         pulmonary ventilation, and response to care were                         monitored throughout the procedure. The physical                         status of the patient was re-assessed after the                         procedure.                        After obtaining informed consent, the colonoscope was                         passed under direct vision. Throughout the procedure,                         the patient's blood pressure, pulse, and oxygen                         saturations were monitored continuously. The  Colonoscope was introduced through the anus and                         advanced to the the cecum, identified by appendiceal                         orifice and ileocecal valve. The colonoscopy was                         performed without difficulty. The patient tolerated                         the procedure well. The quality of the bowel                         preparation was evaluated using the BBPS [facility] Bowel                         Preparation Scale) with scores of: Right Colon = 3,                         Transverse Colon = 3 and Left Colon = 3 (entire mucosa                         seen well with no residual staining, small  fragments                         of stool or opaque liquid). The total BBPS score                         equals 9. The ileocecal valve, appendiceal orifice,                         and rectum were photographed. Findings:      The perianal and digital rectal examinations were normal. Pertinent       negatives include normal sphincter tone.      Two sessile polyps were found in the ascending colon. The polyps were 1       to 2 mm in size. These polyps were removed with a jumbo cold forceps.       Resection and retrieval were complete. Estimated blood loss was minimal.      A 21 to 23 mm polyp was found in the appendiceal orifice. The polyp was       sessile. Area was successfully injected with 5 mL Eleview for lesion       assessment, and this injection appeared to lift the lesion adequately.       Imaging was performed using white light and narrow band imaging to       visualize the mucosa. NBI used to assess margins for EMR. The polyp was       removed with a hot snare. Resection and retrieval were complete.       Estimated blood loss was minimal. To prevent bleeding after the       polypectomy, two hemostatic clips were successfully placed (MR       conditional). There was no bleeding at the end of the procedure.       Estimated blood loss: none.  The exam was otherwise without abnormality on direct and retroflexion       views. Impression:            - Two 1 to 2 mm polyps in the ascending colon, removed                         with a jumbo cold forceps. Resected and retrieved.                        - One 21 to 23 mm polyp at the appendiceal orifice,                         removed with a hot snare. Resected and retrieved.                         Injected. Clips (MR conditional) were placed.                        - The examination was otherwise normal on direct and                         retroflexion views. Recommendation:        - Patient has a contact number available for                          emergencies. The signs and symptoms of potential                         delayed complications were discussed with the patient.                         Return to normal activities tomorrow. Written                         discharge instructions were provided to the patient.                        - Discharge patient to home.                        - Resume previous diet.                        - Continue present medications.                        - Await pathology results.                        - Repeat colonoscopy for surveillance based on                         pathology results.                        - Return to GI office at appointment to be scheduled.                        - The findings and recommendations were discussed with  the patient. Procedure Code(s):     --- Professional ---                        [phone], Colonoscopy, flexible; with removal of                         tumor(s), polyp(s), or other lesion(s) by snare                         technique                        45380, 59, Colonoscopy, flexible; with biopsy, single                         or multiple                        45381, Colonoscopy, flexible; with directed submucosal                         injection(s), any substance Diagnosis Code(s):     --- Professional ---                        D12.2, Benign neoplasm of ascending colon                        D12.1, Benign neoplasm of appendix                        D50.9, Iron  deficiency anemia, unspecified CPT copyright 2022 American Medical Association. All rights reserved. The codes documented in this report are preliminary and upon coder review may  be revised to meet current compliance requirements. Attending Participation:      I personally performed the entire procedure. Elspeth Jungling, DO Elspeth Ozell Jungling DO, DO 10/15/2023 8:33:42 AM This report has been signed electronically. Number of Addenda: 0 Note  Initiated On: 10/15/2023 7:56 AM Scope Withdrawal Time: 0 hours 18 minutes 36 seconds  Total Procedure Duration: 0 hours 23 minutes 24 seconds  Estimated Blood Loss:  Estimated blood loss was minimal.      [facility]

## 2023-10-18 LAB — SURGICAL PATHOLOGY

## 2023-10-20 ENCOUNTER — Telehealth: Payer: Self-pay

## 2023-10-20 NOTE — Telephone Encounter (Signed)
 Per Dr. Twylla, Patient is to be scheduled for  Left Ureteroscopy with Laser Lithotripsy and Stent Placement   Mrs. Calder was contacted and possible surgical dates were discussed, Thursday August 7th, 2025 was agreed upon for surgery.   Patient was instructed that Dr. Twylla will require them to provide a pre-op UA & CX prior to surgery. This was ordered and scheduled drop off appointment was made for 11/11/2023.    Patient was directed to call [phone] between 1-3pm the day before surgery to find out surgical arrival time.  Instructions were given not to eat or drink from midnight on the night before surgery and have a driver for the day of surgery. On the surgery day patient was instructed to enter through the Medical Mall entrance of [facility] report the Same Day Surgery desk.   Pre-Admit Testing will be in contact via phone to set up an interview with the anesthesia team to review your history and medications prior to surgery.   Reminder of this information was sent via MyChart to the patient.

## 2023-10-20 NOTE — Progress Notes (Signed)
   [facility] Surgical Posting Form  Surgery Date: Date: 11/25/2023  Surgeon: Dr. Glendia Barba, MD  Inpt ( No  )   Outpt (Yes)   Obs ( No  )   Diagnosis: N20.0 Left Nephrolithiasis  -CPT: [phone]  Surgery: Left Ureteroscopy with Laser Lithotripsy and Stent Placement   Stop Anticoagulations: No  Cardiac/Medical/Pulmonary Clearance needed: no  *Orders entered into EPIC  Date: 10/20/23   *Case booked in [place]  Date: 10/18/2023  *Notified pt of Surgery: Date: 10/18/2023  PRE-OP UA & CX: yes, will obtain in clinic on 11/11/2023  *Placed into Prior Authorization Work [place] Date: 10/20/23  Assistant/laser/rep:No

## 2023-11-11 ENCOUNTER — Other Ambulatory Visit

## 2023-11-11 DIAGNOSIS — N200 Calculus of kidney: Secondary | ICD-10-CM

## 2023-11-11 LAB — MICROSCOPIC EXAMINATION

## 2023-11-11 LAB — URINALYSIS, COMPLETE
Bilirubin, UA: NEGATIVE
Glucose, UA: NEGATIVE
Ketones, UA: NEGATIVE
Leukocytes,UA: NEGATIVE
Nitrite, UA: NEGATIVE
Protein,UA: NEGATIVE
RBC, UA: NEGATIVE
Specific Gravity, UA: 1.025 (ref 1.005–1.030)
Urobilinogen, Ur: 0.2 mg/dL (ref 0.2–1.0)
pH, UA: 6 (ref 5.0–7.5)

## 2023-11-17 ENCOUNTER — Other Ambulatory Visit: Payer: Self-pay

## 2023-11-17 ENCOUNTER — Encounter: Payer: Self-pay | Admitting: Urology

## 2023-11-17 ENCOUNTER — Encounter
Admission: RE | Admit: 2023-11-17 | Discharge: 2023-11-17 | Disposition: A | Discharge status: Home / Self Care | Source: Ambulatory Visit | Attending: Urology | Admitting: Urology

## 2023-11-17 VITALS — Wt 132.0 lb

## 2023-11-17 DIAGNOSIS — Z01812 Encounter for preprocedural laboratory examination: Secondary | ICD-10-CM

## 2023-11-17 DIAGNOSIS — N200 Calculus of kidney: Secondary | ICD-10-CM

## 2023-11-17 LAB — CULTURE, URINE COMPREHENSIVE

## 2023-11-17 NOTE — Patient Instructions (Addendum)
 Your procedure is scheduled on: 11/25/2023 Thursday Report to the Registration Desk on the 1st floor of the Medical Mall. To find out your arrival time, please call [phone] between 1PM - 3PM on: 11/24/2023 Wednesday If your arrival time is 6:00 am, do not arrive before that time as the Medical Mall entrance doors do not open until 6:00 am.  REMEMBER: Instructions that are not followed completely may result in serious medical risk, up to and including death; or upon the discretion of your surgeon and anesthesiologist your surgery may need to be rescheduled.  Do not eat food after midnight the night before surgery.  No gum chewing or hard candies.    One week prior to surgery: Stop Anti-inflammatories (NSAIDS) such as Advil, Aleve, Ibuprofen, Motrin, Naproxen, Naprosyn and Aspirin based products such as Excedrin, Goody's Powder, BC Powder. Stop ANY OVER THE COUNTER supplements until after surgery like VITAMIN D -VITAMIN,  Multiple Vitamins-Minerals , cyanocobalamin  (VITAMIN B12), Biotin,   You may however, continue to take Tylenol  if needed for pain up until the day of surgery.   Continue taking all of your other prescription medications up until the day of surgery.  ON THE DAY OF SURGERY ONLY TAKE THESE MEDICATIONS WITH SIPS OF WATER:  gabapentin  (NEURONTIN ) levothyroxine  (SYNTHROID ) pantoprazole  (PROTONIX )   Use inhalers on the day of surgery as prescribed and  and bring to the hospital.   No Alcohol for 24 hours before or after surgery.  No Smoking including e-cigarettes for 24 hours before surgery.  No chewable tobacco products for at least 6 hours before surgery.  No nicotine patches on the day of surgery.  Do not use any recreational drugs for at least a week (preferably 2 weeks) before your surgery.  Please be advised that the combination of cocaine and anesthesia may have negative outcomes, up to and including death. If you test positive for cocaine, your surgery  will be cancelled.  On the morning of surgery brush your teeth with toothpaste and water, you may rinse your mouth with mouthwash if you wish. Do not swallow any toothpaste or mouthwash.  You may need to shower on day of surgery.   Do not wear jewelry, make-up, hairpins, clips or nail polish.  For welded (permanent) jewelry: bracelets, anklets, waist bands, etc.  Please have this removed prior to surgery.  If it is not removed, there is a chance that hospital personnel will need to cut it off on the day of surgery.  Do not wear lotions, powders, or perfumes.   Do not shave body hair from the neck down 48 hours before surgery.  Contact lenses, hearing aids and dentures may not be worn into surgery.  Do not bring valuables to the hospital. [facility] is not responsible for any missing/lost belongings or valuables.    Notify your doctor if there is any change in your medical condition (cold, fever, infection).  Wear comfortable clothing (specific to your surgery type) to the hospital.  After surgery, you can help prevent lung complications by doing breathing exercises.  Take deep breaths and cough every 1-2 hours. Your doctor may order a device called an Incentive Spirometer to help you take deep breaths. When coughing or sneezing, hold a pillow firmly against your incision with both hands. This is called "splinting." Doing this helps protect your incision. It also decreases belly discomfort.  If you are being admitted to the hospital overnight, leave your suitcase in the car. After surgery it may be brought to  your room.  In case of increased patient census, it may be necessary for you, the patient, to continue your postoperative care in the Same Day Surgery department.  If you are being discharged the day of surgery, you will not be allowed to drive home. You will need a responsible individual to drive you home and stay with you for 24 hours after surgery.     Please call the  Pre-admissions Testing Dept. at [phone] if you have any questions about these instructions.  Surgery Visitation Policy:  Patients having surgery or a procedure may have two visitors.  Children under the age of 73 must have an adult with them who is not the patient.   Merchandiser, retail to address health-related social needs:  [email or web address]

## 2023-11-17 NOTE — Pre-Procedure Instructions (Signed)
 Patient was instructed to bring the spinal cord stimulator on day of surgery. Patient verbalized understanding.

## 2023-11-18 ENCOUNTER — Encounter
Admission: RE | Admit: 2023-11-18 | Discharge: 2023-11-18 | Disposition: A | Discharge status: Home / Self Care | Source: Ambulatory Visit | Attending: Urology | Admitting: Urology

## 2023-11-18 DIAGNOSIS — N200 Calculus of kidney: Secondary | ICD-10-CM | POA: Insufficient documentation

## 2023-11-18 DIAGNOSIS — Z01812 Encounter for preprocedural laboratory examination: Secondary | ICD-10-CM

## 2023-11-18 DIAGNOSIS — Z01810 Encounter for preprocedural cardiovascular examination: Secondary | ICD-10-CM | POA: Diagnosis present

## 2023-11-25 ENCOUNTER — Other Ambulatory Visit: Payer: Self-pay | Admitting: Urology

## 2023-11-25 ENCOUNTER — Ambulatory Visit

## 2023-11-25 ENCOUNTER — Other Ambulatory Visit: Payer: Self-pay

## 2023-11-25 ENCOUNTER — Ambulatory Visit: Payer: Self-pay | Admitting: Urgent Care

## 2023-11-25 ENCOUNTER — Ambulatory Visit
Admission: RE | Admit: 2023-11-25 | Discharge: 2023-11-25 | Disposition: A | Discharge status: Home / Self Care | Attending: Urology | Admitting: Urology

## 2023-11-25 ENCOUNTER — Encounter: Payer: Self-pay | Admitting: Urology

## 2023-11-25 ENCOUNTER — Encounter
Admission: RE | Disposition: A | Discharge status: Home / Self Care | Payer: Self-pay | Source: Home / Self Care | Attending: Urology

## 2023-11-25 DIAGNOSIS — T85192S Other mechanical complication of implanted electronic neurostimulator (electrode) of spinal cord, sequela: Secondary | ICD-10-CM

## 2023-11-25 DIAGNOSIS — Z9884 Bariatric surgery status: Secondary | ICD-10-CM | POA: Insufficient documentation

## 2023-11-25 DIAGNOSIS — N200 Calculus of kidney: Secondary | ICD-10-CM | POA: Diagnosis present

## 2023-11-25 DIAGNOSIS — Z09 Encounter for follow-up examination after completed treatment for conditions other than malignant neoplasm: Secondary | ICD-10-CM

## 2023-11-25 HISTORY — PX: CYSTOSCOPY/URETEROSCOPY/HOLMIUM LASER/STENT PLACEMENT: SHX6546

## 2023-11-25 HISTORY — DX: Calculus of kidney: N20.0

## 2023-11-25 SURGERY — CYSTOSCOPY/URETEROSCOPY/HOLMIUM LASER/STENT PLACEMENT
Anesthesia: General | Site: Ureter | Laterality: Left

## 2023-11-25 MED ORDER — STERILE WATER FOR IRRIGATION IR SOLN
Status: DC | PRN
  Administered 2023-11-25: 500 mL

## 2023-11-25 MED ORDER — CHLORHEXIDINE GLUCONATE 0.12 % MT SOLN
OROMUCOSAL | Status: AC
  Filled 2023-11-25: qty 15

## 2023-11-25 MED ORDER — OXYBUTYNIN CHLORIDE 5 MG PO TABS
ORAL_TABLET | ORAL | Status: AC
  Filled 2023-11-25: qty 1

## 2023-11-25 MED ORDER — OXYCODONE HCL 5 MG PO TABS
5.0000 mg | ORAL_TABLET | Freq: Once | ORAL | Status: AC | PRN
  Administered 2023-11-25: 5 mg via ORAL

## 2023-11-25 MED ORDER — OXYCODONE HCL 5 MG PO TABS: 5.0000 mg | ORAL_TABLET | Freq: Four times a day (QID) | ORAL | 0 refills | Status: DC | PRN

## 2023-11-25 MED ORDER — MIDAZOLAM HCL 2 MG/2ML IJ SOLN
INTRAMUSCULAR | Status: DC | PRN
  Administered 2023-11-25: 2 mg via INTRAVENOUS

## 2023-11-25 MED ORDER — SUGAMMADEX SODIUM 200 MG/2ML IV SOLN
INTRAVENOUS | Status: DC | PRN
  Administered 2023-11-25: 200 mg via INTRAVENOUS

## 2023-11-25 MED ORDER — ORAL CARE MOUTH RINSE: 15.0000 mL | Freq: Once | OROMUCOSAL | Status: AC

## 2023-11-25 MED ORDER — FENTANYL CITRATE (PF) 100 MCG/2ML IJ SOLN
INTRAMUSCULAR | Status: AC
  Filled 2023-11-25: qty 2

## 2023-11-25 MED ORDER — LIDOCAINE HCL (CARDIAC) PF 100 MG/5ML IV SOSY
PREFILLED_SYRINGE | INTRAVENOUS | Status: DC | PRN
  Administered 2023-11-25: 80 mg via INTRAVENOUS

## 2023-11-25 MED ORDER — OXYCODONE HCL 5 MG/5ML PO SOLN: 5.0000 mg | Freq: Once | ORAL | Status: AC | PRN

## 2023-11-25 MED ORDER — LACTATED RINGERS IV SOLN: INTRAVENOUS | Status: DC

## 2023-11-25 MED ORDER — TAMSULOSIN HCL 0.4 MG PO CAPS: 0.4000 mg | ORAL_CAPSULE | Freq: Every day | ORAL | 0 refills | Status: DC

## 2023-11-25 MED ORDER — OXYBUTYNIN CHLORIDE 5 MG PO TABS
5.0000 mg | ORAL_TABLET | Freq: Once | ORAL | Status: AC
  Administered 2023-11-25: 5 mg via ORAL

## 2023-11-25 MED ORDER — OXYBUTYNIN CHLORIDE 5 MG PO TABS: ORAL_TABLET | ORAL | 0 refills | Status: DC

## 2023-11-25 MED ORDER — ONDANSETRON HCL 4 MG/2ML IJ SOLN
INTRAMUSCULAR | Status: DC | PRN
  Administered 2023-11-25: 4 mg via INTRAVENOUS

## 2023-11-25 MED ORDER — FENTANYL CITRATE (PF) 100 MCG/2ML IJ SOLN
INTRAMUSCULAR | Status: DC | PRN
  Administered 2023-11-25 (×2): 100 ug via INTRAVENOUS

## 2023-11-25 MED ORDER — FENTANYL CITRATE (PF) 100 MCG/2ML IJ SOLN
25.0000 ug | INTRAMUSCULAR | Status: DC | PRN
  Administered 2023-11-25: 25 ug via INTRAVENOUS
  Administered 2023-11-25: 50 ug via INTRAVENOUS
  Administered 2023-11-25: 25 ug via INTRAVENOUS

## 2023-11-25 MED ORDER — PROPOFOL 1000 MG/100ML IV EMUL
INTRAVENOUS | Status: AC
  Filled 2023-11-25: qty 100

## 2023-11-25 MED ORDER — IOHEXOL 180 MG/ML  SOLN
INTRAMUSCULAR | Status: DC | PRN
  Administered 2023-11-25: 10 mL

## 2023-11-25 MED ORDER — CHLORHEXIDINE GLUCONATE 0.12 % MT SOLN
15.0000 mL | Freq: Once | OROMUCOSAL | Status: AC
  Administered 2023-11-25: 15 mL via OROMUCOSAL

## 2023-11-25 MED ORDER — CEFAZOLIN SODIUM-DEXTROSE 2-4 GM/100ML-% IV SOLN
2.0000 g | INTRAVENOUS | Status: AC
  Administered 2023-11-25: 2 g via INTRAVENOUS

## 2023-11-25 MED ORDER — PHENAZOPYRIDINE HCL 200 MG PO TABS: 200.0000 mg | ORAL_TABLET | Freq: Three times a day (TID) | ORAL | 0 refills | Status: AC | PRN

## 2023-11-25 MED ORDER — MIDAZOLAM HCL 2 MG/2ML IJ SOLN
INTRAMUSCULAR | Status: AC
  Filled 2023-11-25: qty 2

## 2023-11-25 MED ORDER — SODIUM CHLORIDE 0.9 % IR SOLN
Status: DC | PRN
  Administered 2023-11-25: 3000 mL

## 2023-11-25 MED ORDER — PROPOFOL 10 MG/ML IV BOLUS
INTRAVENOUS | Status: DC | PRN
  Administered 2023-11-25: 150 mg via INTRAVENOUS
  Administered 2023-11-25: 50 mg via INTRAVENOUS

## 2023-11-25 MED ORDER — OXYCODONE HCL 5 MG PO TABS
ORAL_TABLET | ORAL | Status: AC
Start: 2023-11-25 — End: 2023-11-25
  Filled 2023-11-25: qty 1

## 2023-11-25 MED ORDER — LIDOCAINE HCL (PF) 2 % IJ SOLN
INTRAMUSCULAR | Status: AC
Start: 2023-11-25 — End: 2023-11-25
  Filled 2023-11-25: qty 5

## 2023-11-25 MED ORDER — PROPOFOL 1000 MG/100ML IV EMUL
INTRAVENOUS | Status: AC
  Filled 2023-11-25: qty 200

## 2023-11-25 MED ORDER — DEXAMETHASONE SODIUM PHOSPHATE 10 MG/ML IJ SOLN
INTRAMUSCULAR | Status: DC | PRN
  Administered 2023-11-25: 5 mg via INTRAVENOUS

## 2023-11-25 MED ORDER — ROCURONIUM BROMIDE 10 MG/ML (PF) SYRINGE
PREFILLED_SYRINGE | INTRAVENOUS | Status: AC
  Filled 2023-11-25: qty 10

## 2023-11-25 MED ORDER — PROPOFOL 500 MG/50ML IV EMUL
INTRAVENOUS | Status: DC | PRN
  Administered 2023-11-25: 125 ug/kg/min via INTRAVENOUS

## 2023-11-25 MED ORDER — LABETALOL HCL 5 MG/ML IV SOLN
INTRAVENOUS | Status: DC | PRN
  Administered 2023-11-25 (×2): 5 mg via INTRAVENOUS

## 2023-11-25 MED ORDER — PROPOFOL 10 MG/ML IV BOLUS
INTRAVENOUS | Status: AC
  Filled 2023-11-25: qty 20

## 2023-11-25 MED ORDER — ROCURONIUM BROMIDE 100 MG/10ML IV SOLN
INTRAVENOUS | Status: DC | PRN
  Administered 2023-11-25: 50 mg via INTRAVENOUS
  Administered 2023-11-25: 20 mg via INTRAVENOUS

## 2023-11-25 MED ORDER — CEFAZOLIN SODIUM-DEXTROSE 2-4 GM/100ML-% IV SOLN
INTRAVENOUS | Status: AC
  Filled 2023-11-25: qty 100

## 2023-11-25 SURGICAL SUPPLY — 23 items
BAG DRAIN SIEMENS DORNER NS (MISCELLANEOUS) ×1 IMPLANT
BASKET ZERO TIP 1.9FR (BASKET) IMPLANT
BRUSH SCRUB EZ 4% CHG (MISCELLANEOUS) ×1 IMPLANT
CATH URET FLEX-TIP 2 LUMEN 10F (CATHETERS) IMPLANT
CATH URETL OPEN END 6X70 (CATHETERS) IMPLANT
CNTNR URN SCR LID CUP LEK RST (MISCELLANEOUS) IMPLANT
DRAPE UTILITY 15X26 TOWEL STRL (DRAPES) ×1 IMPLANT
FIBER LASER MOSES 200 DFL (Laser) IMPLANT
GLOVE BIOGEL PI IND STRL 7.5 (GLOVE) ×1 IMPLANT
GOWN STRL REUS W/ TWL LRG LVL3 (GOWN DISPOSABLE) ×1 IMPLANT
GOWN STRL REUS W/ TWL XL LVL3 (GOWN DISPOSABLE) ×1 IMPLANT
GUIDEWIRE STR DUAL SENSOR (WIRE) ×1 IMPLANT
KIT TURNOVER CYSTO (KITS) ×1 IMPLANT
PACK CYSTO AR (MISCELLANEOUS) ×1 IMPLANT
SET CYSTO W/LG BORE CLAMP LF (SET/KITS/TRAYS/PACK) ×1 IMPLANT
SHEATH NAVIGATOR HD 12/14X36 (SHEATH) IMPLANT
SOL .9 NS 3000ML IRR UROMATIC (IV SOLUTION) ×1 IMPLANT
STENT URET 6FRX22 CONTOUR (STENTS) IMPLANT
STENT URET 6FRX24 CONTOUR (STENTS) IMPLANT
STENT URET 6FRX26 CONTOUR (STENTS) IMPLANT
SURGILUBE 2OZ TUBE FLIPTOP (MISCELLANEOUS) ×1 IMPLANT
VALVE UROSEAL ADJ ENDO (VALVE) IMPLANT
WATER STERILE IRR 500ML POUR (IV SOLUTION) ×1 IMPLANT

## 2023-11-25 NOTE — Anesthesia Procedure Notes (Signed)
 Procedure Name: Intubation Date/Time: 11/25/2023 10:46 AM  Performed by: Brien Sotero PARAS, CRNAPre-anesthesia Checklist: Patient identified, Patient being monitored, Timeout performed, Emergency Drugs available and Suction available Patient Re-evaluated:Patient Re-evaluated prior to induction Oxygen Delivery Method: Circle system utilized Preoxygenation: Pre-oxygenation with 100% oxygen Induction Type: IV induction Ventilation: Mask ventilation without difficulty Laryngoscope Size: Mac and 3 Grade View: Grade I Tube type: Oral Tube size: 7.0 mm Number of attempts: 1 Airway Equipment and Method: Stylet Placement Confirmation: ETT inserted through vocal cords under direct vision, positive ETCO2 and breath sounds checked- equal and bilateral Secured at: 21 cm Tube secured with: Tape Dental Injury: Teeth and Oropharynx as per pre-operative assessment

## 2023-11-25 NOTE — Anesthesia Postprocedure Evaluation (Signed)
 Anesthesia Post Note  Patient: Luke Velia Buttner  Procedure(s) Performed: CYSTOSCOPY/URETEROSCOPY/HOLMIUM LASER/STENT PLACEMENT (Left: Ureter)  Patient location during evaluation: PACU Anesthesia Type: General Level of consciousness: awake and alert Pain management: pain level controlled Vital Signs Assessment: post-procedure vital signs reviewed and stable Respiratory status: spontaneous breathing, nonlabored ventilation, respiratory function stable and patient connected to nasal cannula oxygen Cardiovascular status: blood pressure returned to baseline and stable Postop Assessment: no apparent nausea or vomiting Anesthetic complications: no   No notable events documented.   Last Vitals:  Vitals:   11/25/23 1300 11/25/23 1327  BP: 132/72 (!) 155/77  Pulse: 79 62  Resp: 14 17  Temp: 36.5 C (!) 36.1 C  SpO2: 98% 98%    Last Pain:  Vitals:   11/25/23 1327  TempSrc: Temporal  PainSc:                  Debby Mines

## 2023-11-25 NOTE — Transfer of Care (Signed)
 Immediate Anesthesia Transfer of Care Note  Patient: Megan [name]  Procedure(s) Performed: CYSTOSCOPY/URETEROSCOPY/HOLMIUM LASER/STENT PLACEMENT (Left: Ureter)  Patient Location: PACU  Anesthesia Type:General  Level of Consciousness: awake and patient cooperative  Airway & Oxygen Therapy: Patient Spontanous Breathing and Patient connected to face mask oxygen  Post-op Assessment: Report given to RN and Post -op Vital signs reviewed and stable  Post vital signs: Reviewed and stable  Last Vitals:  Vitals Value Taken Time  BP 127/72 11/25/23 11:45  Temp    Pulse 73 11/25/23 11:49  Resp 13 11/25/23 11:49  SpO2 100 % 11/25/23 11:49  Vitals shown include unfiled device data.  Last Pain:  Vitals:   11/25/23 0807  TempSrc: Temporal  PainSc: 7          Complications: No notable events documented.

## 2023-11-25 NOTE — Brief Op Note (Signed)
 error

## 2023-11-25 NOTE — Discharge Instructions (Signed)
 DISCHARGE INSTRUCTIONS FOR KIDNEY STONE/URETERAL STENT   MEDICATIONS:  1. Resume all your other meds from home.  2.  AZO (over-the-counter) can help with the burning/stinging when you urinate. 3.  Prescriptions for tamsulosin  and oxybutynin  were sent to your pharmacy which can help with stent/bladder irritation  ACTIVITY:  1. May resume regular activities in 24 hours. 2. No driving while on narcotic pain medications  3. Drink plenty of water   4. Continue to walk at home - you can still get blood clots when you are at home, so keep active, but don't over do it.  5. May return to work/school tomorrow or when you feel ready   BATHING:  1. You can shower. 2. You have a string coming from your urethra: The stent string is attached to your ureteral stent. Do not pull on this.   SIGNS/SYMPTOMS TO CALL:  Common postoperative symptoms include urinary frequency, urgency, bladder spasm and blood in the urine  Please call us  if you have a fever greater than 101.5, uncontrolled nausea/vomiting, uncontrolled pain, dizziness, unable to urinate, excessively bloody urine, chest pain, shortness of breath, leg swelling, leg pain, or any other concerns or questions.   You can reach us  at [phone].   FOLLOW-UP:  1. You will be contacted for a follow-up appointment in ~1 month 2. You have a string attached to your stent, you may remove it on Monday, 11/29/2023. To do this, pull the string until the stent is completely removed. You may feel an odd sensation in your back.

## 2023-11-25 NOTE — Anesthesia Preprocedure Evaluation (Signed)
 Anesthesia Evaluation  Patient identified by MRN, date of birth, ID band Patient awake    Reviewed: Allergy & Precautions, H&P , NPO status , Patient's Chart, lab work & pertinent test results  History of Anesthesia Complications (+) PONV and history of anesthetic complications  Airway Mallampati: II  TM Distance: >3 FB Neck ROM: full    Dental no notable dental hx.    Pulmonary asthma  Asthma -moderate-persistent-controlled    Pulmonary exam normal        Cardiovascular Exercise Tolerance: Good hypertension, Normal cardiovascular exam  >4 METS (Limited by chronic back pain.  ECHO 01/20/22 EF>55% INTERPRETATION NORMAL LEFT VENTRICULAR SYSTOLIC FUNCTION NORMAL RIGHT VENTRICULAR SYSTOLIC FUNCTION MILD VALVULAR REGURGITATION (See above) NO VALVULAR STENOSIS   EKG 11/22/19: Normal sinus rhythm  DVT (LLE DVT in 2016 treated with OAC at the time)   Neuro/Psych         Pain Chronic pain: : musculoskeletal, neuropathic Opioid tolerance  Neuropathy (trigeminal neuralgia managed with tegratol)  Spinal cord stimulator placed on 09/03/21-  Malfunction of spinal cord stimulator, sequela DDD s/p L4-L5 decompression on 01/2021  Neuromuscular disease    GI/Hepatic Neg liver ROS,GERD  ,,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   Endo/Other  Hypothyroidism    Renal/GU      Musculoskeletal   Abdominal   Peds  Hematology negative hematology ROS (+)   Anesthesia Other Findings Past Medical History: No date: Anemia No date: Arthritis No date: Asthma No date: CSR (central serous retinopathy), left No date: DDD (degenerative disc disease), lumbosacral 2008: DVT (deep venous thrombosis) (HCC)     Comment:  left femeral s/p femeral osteotomy No date: Dyspnea No date: Facet syndrome, lumbar No date: Family history of adverse reaction to anesthesia     Comment:  mother had reaction but doesn't know what it  is No date: GERD (gastroesophageal reflux disease) No date: Heart murmur No date: History of kidney stones No date: Hypertension No date: Hypothyroidism No date: Lumbar foraminal stenosis No date: Malfunction of spinal cord stimulator, 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 No date: Pneumonia No date: Polycystic ovary No date: PONV (postoperative nausea and vomiting) No date: Primary osteoarthritis of left knee No date: Pseudophakia of both eyes No date: Renal disorder No date: Thyroid disease No date: Trigeminal neuralgia  Past Surgical History: No date: ABDOMINAL HYSTERECTOMY 1980: APPENDECTOMY 06/29/2019: CYSTOSCOPY/URETEROSCOPY/HOLMIUM LASER/STENT PLACEMENT;  Right     Comment:  Procedure: CYSTOSCOPY/URETEROSCOPY/HOLMIUM LASER/STENT               PLACEMENT;  Surgeon: Penne Knee, MD;  Location: [facility]              ORS;  Service: Urology;  Laterality: Right; 07/15/2020: CYSTOSCOPY/URETEROSCOPY/HOLMIUM LASER/STENT PLACEMENT 12/01/2021: CYSTOSCOPY/URETEROSCOPY/HOLMIUM LASER/STENT PLACEMENT;  Bilateral     Comment:  Procedure: CYSTOSCOPY/URETEROSCOPY/HOLMIUM LASER/STENT               PLACEMENT;  Surgeon: Penne Knee, MD;  Location: [facility]              ORS;  Service: Urology;  Laterality: Bilateral; No date: ESOPHAGOGASTRODUODENOSCOPY     Comment:  2021, 08/16/2020, 11/13/2020 No date: FEMORAL OSTEOTOMY W/ APPLICATION EXTERNAL FIXATOR; Right No date: KNEE ARTHROSCOPY W/ ACL RECONSTRUCTION; Left 01/13/2017: KNEE ARTHROSCOPY W/ HARDWARE REMOVAL; Right 02/12/2020: LAPAROSCOPIC GASTRIC BYPASS No date: LITHOTRIPSY 01/22/2021: LUMBAR LAMINECTOMY/DECOMPRESSION MICRODISCECTOMY; Right     Comment:  Procedure: L4-5 DECOMPRESSION;  Surgeon: Clois,  Reeves, MD;  Location: [facility] ORS;  Service: Neurosurgery;              Laterality: Right; 2017: OSTEOTOMY; Bilateral     Comment:  femoral shaft/supracondylar 09/03/2021: PULSE GENERATOR IMPLANT; N/A     Comment:   Procedure: PULSE GENERATOR IMPLANT;  Surgeon: Clois Reeves, MD;  Location: [facility] ORS;  Service: Neurosurgery;              Laterality: N/A; No date: ROTATOR CUFF REPAIR; Right 09/03/2021: THORACIC LAMINECTOMY FOR SPINAL CORD STIMULATOR; N/A     Comment:  Procedure: THORACIC LAMINECTOMY FOR SPINAL CORD               STIMULATOR PLACEMENT ([employer]);  Surgeon:               Clois Reeves, MD;  Location: [facility] ORS;  Service:               Neurosurgery;  Laterality: N/A; 1964: TONSILLECTOMY 11/11/2018: TOTAL KNEE ARTHROPLASTY; Right No date: total knee arthroplasty,     Reproductive/Obstetrics negative OB ROS                              Anesthesia Physical Anesthesia Plan  ASA: 2  Anesthesia Plan: General ETT   Post-op Pain Management:    Induction: Intravenous  PONV Risk Score and Plan: 4 or greater and Propofol  infusion, TIVA, Dexamethasone , Ondansetron  and Midazolam   Airway Management Planned: Oral ETT  Additional Equipment:   Intra-op Plan:   Post-operative Plan: Extubation in OR  Informed Consent: I have reviewed the patients History and Physical, chart, labs and discussed the procedure including the risks, benefits and alternatives for the proposed anesthesia with the patient or authorized representative who has indicated his/her understanding and acceptance.     Dental Advisory Given  Plan Discussed with: Anesthesiologist, CRNA and Surgeon  Anesthesia Plan Comments: (Patient consented for risks of anesthesia including but not limited to:  - adverse reactions to medications - damage to eyes, teeth, lips or other oral mucosa - nerve damage due to positioning  - sore throat or hoarseness - Damage to heart, brain, nerves, lungs, other parts of body or loss of life  Patient voiced understanding and assent.)        Anesthesia Quick Evaluation

## 2023-11-25 NOTE — Interval H&P Note (Signed)
 History and Physical Interval Note:  11/25/2023 10:35 AM  Megan [name]  has presented today for surgery, with the diagnosis of Left Nephrolithasis.  The various methods of treatment have been discussed with the patient and family. After consideration of risks, benefits and other options for treatment, the patient has consented to  Procedure(s): CYSTOSCOPY/URETEROSCOPY/HOLMIUM LASER/STENT PLACEMENT (Left) as a surgical intervention.  The patient's history has been reviewed, patient examined, no change in status, stable for surgery.  I have reviewed the patient's chart and labs.  Questions were answered to the patient's satisfaction.    CV: RRR Lungs: Clear  [place] Ganesh C Anamaria Dusenbury

## 2023-11-25 NOTE — H&P (Signed)
 Urology H&P   Assessment/Recommendations:  1.  Recurrent nephrolithiasis Left renal calculi up to 11 mm.  She is asymptomatic and stone is nonobstructing however she has elected left ureteroscopy with laser lithotripsy/stone removal The procedure has been discussed in detail including potential risks of bleeding, infection, ureteral injury.  We also discussed the slight chance that ureteroscopy cannot be performed due to inability to advance the ureteroscope into the kidney.  If this were to occur a stent will be placed and she would not require follow-up ureteroscopy after passive stent dilation.   History of Present Illness: Megan [name] is a 63 y.o. with history of recurrent stone disease.  Refer to Sam Vaillancourt's previous note 09/07/2023.  She presents for left ureteroscopy  Past Medical History:  Diagnosis Date   Anemia    Arthritis    Asthma    CSR (central serous retinopathy), left    DDD (degenerative disc disease), lumbosacral    DVT (deep venous thrombosis) (HCC) 2008   left femeral s/p femeral osteotomy   Dyspnea    Facet syndrome, lumbar    Family history of adverse reaction to anesthesia    mother had reaction but doesn't know what it is   GERD (gastroesophageal reflux disease)    Heart murmur    History of kidney stones    Hypertension    Hypothyroidism    IDA (iron  deficiency anemia)    Left nephrolithiasis    Lumbar foraminal stenosis    Malfunction of spinal cord stimulator, sequela    Medullary sponge kidney    Pneumonia    Polycystic ovary    PONV (postoperative nausea and vomiting)    Primary osteoarthritis of left knee    Pseudophakia of both eyes    Renal disorder    Thyroid disease    Trigeminal neuralgia     Past Surgical History:  Procedure Laterality Date   ABDOMINAL HYSTERECTOMY     APPENDECTOMY  1980   CATARACT EXTRACTION Bilateral    COLONOSCOPY N/A 10/15/2023   Procedure: COLONOSCOPY;  Surgeon: Onita Elspeth Sharper, DO;   Location: [facility] ENDOSCOPY;  Service: Gastroenterology;  Laterality: N/A;  Pain syndrome, chronic  Spinal cord stimulator status   CYSTOSCOPY/URETEROSCOPY/HOLMIUM LASER/STENT PLACEMENT Right 06/29/2019   Procedure: CYSTOSCOPY/URETEROSCOPY/HOLMIUM LASER/STENT PLACEMENT;  Surgeon: Penne Knee, MD;  Location: [facility] ORS;  Service: Urology;  Laterality: Right;   CYSTOSCOPY/URETEROSCOPY/HOLMIUM LASER/STENT PLACEMENT  07/15/2020   CYSTOSCOPY/URETEROSCOPY/HOLMIUM LASER/STENT PLACEMENT Bilateral 12/01/2021   Procedure: CYSTOSCOPY/URETEROSCOPY/HOLMIUM LASER/STENT PLACEMENT;  Surgeon: Penne Knee, MD;  Location: [facility] ORS;  Service: Urology;  Laterality: Bilateral;   ESOPHAGOGASTRODUODENOSCOPY     2021, 08/16/2020, 11/13/2020   ESOPHAGOGASTRODUODENOSCOPY N/A 10/15/2023   Procedure: EGD (ESOPHAGOGASTRODUODENOSCOPY);  Surgeon: Onita Elspeth Sharper, DO;  Location: [facility] ENDOSCOPY;  Service: Gastroenterology;  Laterality: N/A;   FEMORAL OSTEOTOMY W/ APPLICATION EXTERNAL FIXATOR Right    HEMOSTASIS CLIP PLACEMENT  10/15/2023   Procedure: CONTROL OF HEMORRHAGE, GI TRACT, ENDOSCOPIC, BY CLIPPING OR OVERSEWING;  Surgeon: Onita Elspeth Sharper, DO;  Location: [facility] ENDOSCOPY;  Service: Gastroenterology;;   KNEE ARTHROSCOPY W/ ACL RECONSTRUCTION Left    KNEE ARTHROSCOPY W/ HARDWARE REMOVAL Right 01/13/2017   LAPAROSCOPIC GASTRIC BYPASS  02/12/2020   LITHOTRIPSY     LUMBAR LAMINECTOMY/DECOMPRESSION MICRODISCECTOMY Right 01/22/2021   Procedure: L4-5 DECOMPRESSION;  Surgeon: Clois Fret, MD;  Location: [facility] ORS;  Service: Neurosurgery;  Laterality: Right;   LUMBAR SPINAL CORD SIMULATOR REVISION N/A 12/02/2022   Procedure: REVISION OF SPINAL CORD STIMULATOR ([employer]);  Surgeon: Clois Fret,  MD;  Location: [facility] ORS;  Service: Neurosurgery;  Laterality: N/A;   OSTEOTOMY Bilateral 2017   femoral shaft/supracondylar   POLYPECTOMY  10/15/2023   Procedure: POLYPECTOMY, INTESTINE;  Surgeon: Onita Elspeth Sharper, DO;  Location: [facility] ENDOSCOPY;  Service: Gastroenterology;;   PULSE GENERATOR IMPLANT N/A 09/03/2021   Procedure: PULSE GENERATOR IMPLANT;  Surgeon: Clois Fret, MD;  Location: [facility] ORS;  Service: Neurosurgery;  Laterality: N/A;   ROTATOR CUFF REPAIR Right    THORACIC LAMINECTOMY FOR SPINAL CORD STIMULATOR N/A 09/03/2021   Procedure: THORACIC LAMINECTOMY FOR SPINAL CORD STIMULATOR PLACEMENT ([employer]);  Surgeon: Clois Fret, MD;  Location: [facility] ORS;  Service: Neurosurgery;  Laterality: N/A;   TONSILLECTOMY  1964   TOTAL KNEE ARTHROPLASTY Right 11/11/2018   total knee arthroplasty,      Home Medications:  Current Meds  Medication Sig   acetaminophen  (TYLENOL ) 500 MG tablet Take 500 mg by mouth every 6 (six) hours as needed for mild pain (pain score 1-3).   albuterol  (VENTOLIN  HFA) 108 (90 Base) MCG/ACT inhaler Inhale 1-2 puffs into the lungs every 6 (six) hours as needed for wheezing or shortness of breath.   Biotin 89999 MCG TABS Take 10,000 tablets by mouth daily.   budesonide  (PULMICORT ) 0.5 MG/2ML nebulizer solution Take 0.5 mg by nebulization daily as needed (shortness of breath).   carbamazepine  (TEGRETOL ) 200 MG tablet Take 200 mg by mouth in the morning and at bedtime.   cyanocobalamin  (VITAMIN B12) 1000 MCG tablet Take 1,000 mcg by mouth daily.   fluticasone  (FLONASE ) 50 MCG/ACT nasal spray Place 2 sprays into both nostrils daily.   gabapentin  (NEURONTIN ) 300 MG capsule Take 300 mg by mouth 3 (three) times daily.   levothyroxine  (SYNTHROID ) 50 MCG tablet Take 50 mcg by mouth daily before breakfast.   losartan  (COZAAR ) 100 MG tablet Take 75 mg by mouth at bedtime.   montelukast  (SINGULAIR ) 10 MG tablet Take 10 mg by mouth at bedtime.   Multiple Vitamins-Minerals (BARIATRIC MULTIVITAMINS PO) Take 1 tablet by mouth daily.   oxyCODONE  (ROXICODONE ) 5 MG immediate release tablet Take 1 tablet (5 mg total) by mouth every 6 (six) hours as needed for  severe pain.   pantoprazole  (PROTONIX ) 40 MG tablet Take 40 mg by mouth 3 (three) times daily with meals.   tiZANidine  (ZANAFLEX ) 2 MG tablet TAKE 1-2 TABLETS (2-4 MG TOTAL) BY MOUTH EVERY 8 (EIGHT) HOURS AS NEEDED FOR MUSCLE SPASMS.   traMADol  (ULTRAM ) 50 MG tablet Take 50 mg by mouth every 6 (six) hours as needed for moderate pain (pain score 4-6).   TRELEGY ELLIPTA 200-62.5-25 MCG/ACT AEPB Inhale 1 puff into the lungs daily as needed (shortness of breath during high pollen season).   VITAMIN D -VITAMIN K PO Take 1 tablet by mouth daily.   [DISCONTINUED] levalbuterol  (XOPENEX  HFA) 45 MCG/ACT inhaler Inhale 2 puffs into the lungs every 4 (four) hours as needed for wheezing.    Allergies:  Allergies  Allergen Reactions   Hydrocodone-Acetaminophen  Shortness Of Breath    Headache, Vomiting, nightmares. Pt is able to take acetaminophen .     Latex Rash and Other (See Comments)    Blisters      Morphine  Anaphylaxis, Shortness Of Breath, Nausea And Vomiting, Swelling and Other (See Comments)    Has and facial swelling    Nickel Swelling and Rash   Petrolatum Rash and Other (See Comments)    Blister All petroleum products per patient, facial swelling    Tape Rash    Tolerates  paper tape   Tegaderm Ag Mesh [Silver] Rash   Erythromycin Nausea And Vomiting   Hydromorphone  Hcl Nausea And Vomiting    Bad headache   Nsaids     Avoid due to bariatric surgery   Other Other (See Comments)    Generic medications per patient- redness and burning in gums    Silicone Dermatitis    Including Tegraderm- Rash & Blisters,   Prefers paper tape   Sulfa Antibiotics Rash         History reviewed. No pertinent family history.  Social History:  reports that she has never smoked. She has never used smokeless tobacco. She reports that she does not currently use alcohol. She reports that she does not use drugs.  ROS: A complete review of systems was performed.  All systems are negative except for  pertinent findings as noted.  Physical Exam:  Vital signs in last 24 hours: Temp:  [97.9 F (36.6 C)] 97.9 F (36.6 C) (08/07 0807) Pulse Rate:  [79] 79 (08/07 0807) Resp:  [17] 17 (08/07 0807) BP: (141)/(86) 141/86 (08/07 0807) SpO2:  [100 %] 100 % (08/07 0807) Weight:  [59.9 kg] 59.9 kg (08/07 0807) Constitutional:  Alert and oriented, No acute distress HEENT: [place] AT Respiratory: Normal respiratory effort Psychiatric: Normal mood and affect   Laboratory Data:  No results for input(s): WBC, HGB, HCT in the last 72 hours. No results for input(s): NA, K, CL, CO2, GLUCOSE, BUN, CREATININE, CALCIUM in the last 72 hours. No results for input(s): LABPT, INR in the last 72 hours. No results for input(s): LABURIN in the last 72 hours. Results for orders placed or performed in visit on 11/11/23  CULTURE, URINE COMPREHENSIVE     Status: None   Collection Time: 11/11/23 11:38 AM   Specimen: Urine   UR  Result Value Ref Range Status   Urine Culture, Comprehensive Final report  Final   Organism ID, Bacteria Comment  Final    Comment: Mixed urogenital flora 6,000  Colonies/mL   Microscopic Examination     Status: Abnormal   Collection Time: 11/11/23 11:38 AM   Urine  Result Value Ref Range Status   WBC, UA 0-5 0 - 5 /hpf Final   RBC, Urine 0-2 0 - 2 /hpf Final   Epithelial Cells (non renal) 0-10 0 - 10 /hpf Final   Crystals Present (A) N/A Final   Crystal Type Calcium Oxalate N/A Final   Bacteria, UA Few None seen/Few Final      11/25/2023, 10:32 AM  Glendia Barba,  MD

## 2023-11-25 NOTE — Op Note (Signed)
 Preoperative diagnosis:  Left nephrolithiasis   Postoperative diagnosis:  Left nephrolithiasis  Procedure:  Cystoscopy Left ureteroscopy and stone removal Ureteroscopic laser lithotripsy Left ureteral stent placement (48F/22 cm)  Left retrograde pyelography with interpretation  Surgeon: Glendia C. Leeasia Secrist, M.D.  Anesthesia: General  Complications: None  Intraoperative findings:  Cystoscopy: Bladder mucosa without solid or papillary lesions.  Ureteropyeloscopy: Entire ureter patent without mucosal abnormalities, stricture or calculi.  Randall's plaques noted upper, mid and lower calyces.  Stenotic upper pole infundibulum with 11 mm calculus identified Left retrograde pyelography demonstrated a filling defect within an upper pole calyx consistent with the patient's known calculus.  Narrow infundibulum leading to this calyx Left retrograde pyelography post procedure showed no filling defects, stone fragments or contrast extravasation  EBL: Minimal  Specimens: None   Indication: Megan [name] is a 63 y.o. patient with history of Roux-en-Y gastric bypass and recurrent nephrolithiasis.  Recent CT with 2 small nonobstructing right renal calculi and punctate left renal calculi with an 11 mm upper calyceal stone.  She requested elective treatment of this nonobstructing stone.  Refer to the admission H&P for details.  After reviewing the management options for treatment, the patient elected to proceed with the above surgical procedure(s). We have discussed the potential benefits and risks of the procedure, side effects of the proposed treatment, the likelihood of the patient achieving the goals of the procedure, and any potential problems that might occur during the procedure or recuperation. Informed consent has been obtained.  Description of procedure:  The patient was taken to the operating room and general anesthesia was induced.  The patient was placed in the dorsal lithotomy  position, prepped and draped in the usual sterile fashion, and preoperative antibiotics were administered. A preoperative time-out was performed.   A 21 Fr cystoscope was lubricated, placed per urethra into the bladder.  Panendoscopy was performed with findings described above.    Attention was directed to the left ureteral orifice and a 0.038 Sensor wire was then advanced up the ureter into the renal pelvis under fluoroscopic guidance.  The cystoscope was removed and a dual-lumen catheter was placed over the sensor wire and retrograde pyelogram was performed with findings as described above.  A second sensor wire was placed through the dual-lumen catheter.  A single channel digital flexible ureteroscope was placed over the working wire and advanced into the ureter.  Once in the proximal ureter the guidewire was removed and the ureteroscope was easily advanced into the renal pelvis.  Pyeloscopy was performed with findings described above  A 365 m Moses holmium laser fiber was placed through the ureteroscope.  The stenotic infundibulum was incised with the holmium laser to allow the ureteroscope to be placed within the calyx.  No bleeding was noted.  The 11 mm calculus was then dusted at a setting of 0.3J/80 Hz.  Once completely dusted retrograde pyelogram was performed and each calyx was examined under fluoroscopic guidance and no significantly sized stone fragments were identified.  The ureteroscope was removed under direct vision and no significantly sized fragments were identified in the ureter.  A 6 F/24 cm Contour ureteral stent with tether was placed under fluoroscopic guidance.  The wire was then removed with an adequate stent curl noted in the renal pelvis as well as in the bladder.  The tether was cut short and tucked in the vagina.  The bladder was then emptied and the procedure ended.  The patient appeared to tolerate the procedure well and  without complications.  After anesthetic  reversal the patient was transported to the PACU in stable condition.    Plan: She was instructed to remove her stent via the tether on Monday, 11/29/2023 Postop follow-up appointment will be scheduled and ~1 month   Glendia Barba, MD

## 2023-11-26 ENCOUNTER — Encounter: Payer: Self-pay | Admitting: Urology

## 2023-12-07 ENCOUNTER — Telehealth: Payer: Self-pay

## 2023-12-07 DIAGNOSIS — N200 Calculus of kidney: Secondary | ICD-10-CM

## 2023-12-07 MED ORDER — TAMSULOSIN HCL 0.4 MG PO CAPS: 0.4000 mg | ORAL_CAPSULE | Freq: Every day | ORAL | 0 refills | Status: DC

## 2023-12-07 NOTE — Telephone Encounter (Signed)
 Patient called in with c/o right sided flank pain. Pt thinks she is passing a stone on that side. She is currently out of her flomax . I will refill that for her to continue. Sent message to Dr. About a possible CT being done. Will wait to be advised.

## 2023-12-08 ENCOUNTER — Other Ambulatory Visit: Payer: Self-pay

## 2023-12-08 DIAGNOSIS — R1031 Right lower quadrant pain: Secondary | ICD-10-CM

## 2023-12-08 DIAGNOSIS — N200 Calculus of kidney: Secondary | ICD-10-CM

## 2023-12-29 ENCOUNTER — Encounter: Payer: Self-pay | Admitting: Urology

## 2023-12-29 ENCOUNTER — Ambulatory Visit (INDEPENDENT_AMBULATORY_CARE_PROVIDER_SITE_OTHER): Admitting: Urology

## 2023-12-29 ENCOUNTER — Other Ambulatory Visit: Payer: Self-pay | Admitting: Urology

## 2023-12-29 VITALS — BP 146/87 | HR 84 | Ht 63.0 in | Wt 130.0 lb

## 2023-12-29 DIAGNOSIS — N200 Calculus of kidney: Secondary | ICD-10-CM | POA: Diagnosis not present

## 2023-12-29 NOTE — Progress Notes (Signed)
 12/29/2023 2:33 PM   Megan [name] [birth year] [id number]  Referring provider: Epifanio Alm SQUIBB, MD [address] [place],  [place] [address]  Chief Complaint  Patient presents with   Nephrolithiasis    HPI: Megan [name] is a 63 y.o. female presents for postop follow-up.  Status post left ureteroscopy with ureteroscopic removal of a 11 mm left upper pole renal calculus.  The stone was within the stenotic upper pole infundibulum and removed in its entirety.  Stent was left on a tether and removed 3 days postop Shortly after her procedure she developed right renal colic and is fairly certain she passed a small stone.  She did have 2 right renal calculi on her CT She is interested in pursuing a follow-up 24-hour urine study as her last stone formed within 1 year Presently without complaints Scheduled for knee replacement surgery in 1 month   PMH: Past Medical History:  Diagnosis Date   Anemia    Arthritis    Asthma    CSR (central serous retinopathy), left    DDD (degenerative disc disease), lumbosacral    DVT (deep venous thrombosis) (HCC) 2008   left femeral s/p femeral osteotomy   Dyspnea    Facet syndrome, lumbar    Family history of adverse reaction to anesthesia    mother had reaction but doesn't know what it is   GERD (gastroesophageal reflux disease)    Heart murmur    History of kidney stones    Hypertension    Hypothyroidism    IDA (iron  deficiency anemia)    Left nephrolithiasis    Lumbar foraminal stenosis    Malfunction of spinal cord stimulator, sequela    Medullary sponge kidney    Pneumonia    Polycystic ovary    PONV (postoperative nausea and vomiting)    Primary osteoarthritis of left knee    Pseudophakia of both eyes    Renal disorder    Thyroid disease    Trigeminal neuralgia     Surgical History: Past Surgical History:  Procedure Laterality Date   ABDOMINAL HYSTERECTOMY     APPENDECTOMY  1980   CATARACT EXTRACTION  Bilateral    COLONOSCOPY N/A 10/15/2023   Procedure: COLONOSCOPY;  Surgeon: Onita Elspeth Sharper, DO;  Location: [facility] ENDOSCOPY;  Service: Gastroenterology;  Laterality: N/A;  Pain syndrome, chronic  Spinal cord stimulator status   CYSTOSCOPY/URETEROSCOPY/HOLMIUM LASER/STENT PLACEMENT Right 06/29/2019   Procedure: CYSTOSCOPY/URETEROSCOPY/HOLMIUM LASER/STENT PLACEMENT;  Surgeon: Penne Knee, MD;  Location: [facility] ORS;  Service: Urology;  Laterality: Right;   CYSTOSCOPY/URETEROSCOPY/HOLMIUM LASER/STENT PLACEMENT  07/15/2020   CYSTOSCOPY/URETEROSCOPY/HOLMIUM LASER/STENT PLACEMENT Bilateral 12/01/2021   Procedure: CYSTOSCOPY/URETEROSCOPY/HOLMIUM LASER/STENT PLACEMENT;  Surgeon: Penne Knee, MD;  Location: [facility] ORS;  Service: Urology;  Laterality: Bilateral;   CYSTOSCOPY/URETEROSCOPY/HOLMIUM LASER/STENT PLACEMENT Left 11/25/2023   Procedure: CYSTOSCOPY/URETEROSCOPY/HOLMIUM LASER/STENT PLACEMENT;  Surgeon: Twylla Glendia BROCKS, MD;  Location: [facility] ORS;  Service: Urology;  Laterality: Left;   ESOPHAGOGASTRODUODENOSCOPY     2021, 08/16/2020, 11/13/2020   ESOPHAGOGASTRODUODENOSCOPY N/A 10/15/2023   Procedure: EGD (ESOPHAGOGASTRODUODENOSCOPY);  Surgeon: Onita Elspeth Sharper, DO;  Location: [facility] ENDOSCOPY;  Service: Gastroenterology;  Laterality: N/A;   FEMORAL OSTEOTOMY W/ APPLICATION EXTERNAL FIXATOR Right    HEMOSTASIS CLIP PLACEMENT  10/15/2023   Procedure: CONTROL OF HEMORRHAGE, GI TRACT, ENDOSCOPIC, BY CLIPPING OR OVERSEWING;  Surgeon: Onita Elspeth Sharper, DO;  Location: [facility] ENDOSCOPY;  Service: Gastroenterology;;   KNEE ARTHROSCOPY W/ ACL RECONSTRUCTION Left    KNEE ARTHROSCOPY W/ HARDWARE REMOVAL Right 01/13/2017   LAPAROSCOPIC  GASTRIC BYPASS  02/12/2020   LITHOTRIPSY     LUMBAR LAMINECTOMY/DECOMPRESSION MICRODISCECTOMY Right 01/22/2021   Procedure: L4-5 DECOMPRESSION;  Surgeon: Clois Fret, MD;  Location: [facility] ORS;  Service: Neurosurgery;  Laterality: Right;   LUMBAR SPINAL CORD  SIMULATOR REVISION N/A 12/02/2022   Procedure: REVISION OF SPINAL CORD STIMULATOR ([employer]);  Surgeon: Clois Fret, MD;  Location: [facility] ORS;  Service: Neurosurgery;  Laterality: N/A;   OSTEOTOMY Bilateral 2017   femoral shaft/supracondylar   POLYPECTOMY  10/15/2023   Procedure: POLYPECTOMY, INTESTINE;  Surgeon: Onita Elspeth Sharper, DO;  Location: [facility] ENDOSCOPY;  Service: Gastroenterology;;   PULSE GENERATOR IMPLANT N/A 09/03/2021   Procedure: PULSE GENERATOR IMPLANT;  Surgeon: Clois Fret, MD;  Location: [facility] ORS;  Service: Neurosurgery;  Laterality: N/A;   ROTATOR CUFF REPAIR Right    THORACIC LAMINECTOMY FOR SPINAL CORD STIMULATOR N/A 09/03/2021   Procedure: THORACIC LAMINECTOMY FOR SPINAL CORD STIMULATOR PLACEMENT ([employer]);  Surgeon: Clois Fret, MD;  Location: [facility] ORS;  Service: Neurosurgery;  Laterality: N/A;   TONSILLECTOMY  1964   TOTAL KNEE ARTHROPLASTY Right 11/11/2018   total knee arthroplasty,      Home Medications:  Allergies as of 12/29/2023       Reactions   Hydrocodone-acetaminophen  Shortness Of Breath   Headache, Vomiting, nightmares. Pt is able to take acetaminophen .   Latex Rash, Other (See Comments)   Blisters    Morphine  Anaphylaxis, Shortness Of Breath, Nausea And Vomiting, Swelling, Other (See Comments)   Has and facial swelling   Nickel Swelling, Rash   Petrolatum Rash, Other (See Comments)   Blister All petroleum products per patient, facial swelling   Tape Rash   Tolerates paper tape   Tegaderm Ag Mesh [silver] Rash   Erythromycin Nausea And Vomiting   Hydromorphone  Hcl Nausea And Vomiting   Bad headache   Nsaids    Avoid due to bariatric surgery   Other Other (See Comments)   Generic medications per patient- redness and burning in gums   Silicone Dermatitis   Including Tegraderm- Rash & Blisters,   Prefers paper tape   Sulfa Antibiotics Rash           Medication List        Accurate as of  December 29, 2023  2:33 PM. If you have any questions, ask your nurse or doctor.          STOP taking these medications    oxybutynin  5 MG tablet Commonly known as: DITROPAN  Stopped by: Glendia JAYSON Barba   oxyCODONE  5 MG immediate release tablet Commonly known as: Roxicodone  Stopped by: Glendia JAYSON Barba   tamsulosin  0.4 MG Caps capsule Commonly known as: FLOMAX  Stopped by: Glendia JAYSON Barba       TAKE these medications    acetaminophen  500 MG tablet Commonly known as: TYLENOL  Take 500 mg by mouth every 6 (six) hours as needed for mild pain (pain score 1-3).   albuterol  108 (90 Base) MCG/ACT inhaler Commonly known as: VENTOLIN  HFA Inhale 1-2 puffs into the lungs every 6 (six) hours as needed for wheezing or shortness of breath.   BARIATRIC MULTIVITAMINS PO Take 1 tablet by mouth daily.   budesonide  0.5 MG/2ML nebulizer solution Commonly known as: PULMICORT  Take 0.5 mg by nebulization daily as needed (shortness of breath).   carbamazepine  200 MG tablet Commonly known as: TEGRETOL  Take 200 mg by mouth in the morning and at bedtime.   cyanocobalamin  1000 MCG tablet Commonly known as: VITAMIN B12 Take 1,000 mcg  by mouth daily.   fluticasone  50 MCG/ACT nasal spray Commonly known as: FLONASE  Place 2 sprays into both nostrils daily.   gabapentin  300 MG capsule Commonly known as: NEURONTIN  Take 300 mg by mouth 3 (three) times daily.   levothyroxine  50 MCG tablet Commonly known as: SYNTHROID  Take 50 mcg by mouth daily before breakfast.   losartan  100 MG tablet Commonly known as: COZAAR  Take 75 mg by mouth at bedtime.   montelukast  10 MG tablet Commonly known as: SINGULAIR  Take 10 mg by mouth at bedtime.   pantoprazole  40 MG tablet Commonly known as: PROTONIX  Take 40 mg by mouth 3 (three) times daily with meals.   phenazopyridine  200 MG tablet Commonly known as: Pyridium  Take 1 tablet (200 mg total) by mouth 3 (three) times daily as needed for pain.    Trelegy Ellipta 200-62.5-25 MCG/ACT Aepb Generic drug: Fluticasone -Umeclidin-Vilant Inhale 1 puff into the lungs daily as needed (shortness of breath during high pollen season).   VITAMIN D -VITAMIN K PO Take 1 tablet by mouth daily.        Allergies:  Allergies  Allergen Reactions   Hydrocodone-Acetaminophen  Shortness Of Breath    Headache, Vomiting, nightmares. Pt is able to take acetaminophen .     Latex Rash and Other (See Comments)    Blisters      Morphine  Anaphylaxis, Shortness Of Breath, Nausea And Vomiting, Swelling and Other (See Comments)    Has and facial swelling    Nickel Swelling and Rash   Petrolatum Rash and Other (See Comments)    Blister All petroleum products per patient, facial swelling    Tape Rash    Tolerates paper tape   Tegaderm Ag Mesh [Silver] Rash   Erythromycin Nausea And Vomiting   Hydromorphone  Hcl Nausea And Vomiting    Bad headache   Nsaids     Avoid due to bariatric surgery   Other Other (See Comments)    Generic medications per patient- redness and burning in gums    Silicone Dermatitis    Including Tegraderm- Rash & Blisters,   Prefers paper tape   Sulfa Antibiotics Rash         Family History: No family history on file.  Social History:  reports that she has never smoked. She has never used smokeless tobacco. She reports that she does not currently use alcohol. She reports that she does not use drugs.   Physical Exam: BP (!) 146/87   Pulse 84   Ht 5' 3 (1.6 m)   Wt 130 lb (59 kg)   BMI 23.03 kg/m   Constitutional:  Alert, No acute distress. HEENT: [place] AT Respiratory: Normal respiratory effort, no increased work of breathing. Psychiatric: Normal mood and affect.    Assessment & Plan:    1.  Recurrent nephrolithiasis Follow-up KUB after she recovers from her knee replacement surgery and will notify with results Repeat 24-hour urine study through [facility] as it has been several years since last evaluation 1  year follow-up with KUB instructed to call earlier for flank pain/renal colic   Glendia JAYSON Barba, MD  [facility] [address] [place], [place] [address] [phone]

## 2023-12-29 NOTE — Patient Instructions (Signed)

## 2024-01-06 ENCOUNTER — Other Ambulatory Visit: Payer: Self-pay | Admitting: *Deleted

## 2024-01-06 DIAGNOSIS — D509 Iron deficiency anemia, unspecified: Secondary | ICD-10-CM

## 2024-01-07 ENCOUNTER — Inpatient Hospital Stay

## 2024-01-07 ENCOUNTER — Inpatient Hospital Stay: Attending: Oncology

## 2024-01-07 DIAGNOSIS — D509 Iron deficiency anemia, unspecified: Secondary | ICD-10-CM | POA: Diagnosis present

## 2024-01-07 LAB — IRON AND TIBC
Iron: 181 ug/dL — ABNORMAL HIGH (ref 28–170)
Saturation Ratios: 42 % — ABNORMAL HIGH (ref 10.4–31.8)
TIBC: 427 ug/dL (ref 250–450)
UIBC: 246 ug/dL

## 2024-01-07 LAB — CBC WITH DIFFERENTIAL/PLATELET
Abs Immature Granulocytes: 0.08 K/uL — ABNORMAL HIGH (ref 0.00–0.07)
Basophils Absolute: 0.1 K/uL (ref 0.0–0.1)
Basophils Relative: 1 %
Eosinophils Absolute: 0.2 K/uL (ref 0.0–0.5)
Eosinophils Relative: 2 %
HCT: 42.9 % (ref 36.0–46.0)
Hemoglobin: 14.2 g/dL (ref 12.0–15.0)
Immature Granulocytes: 1 %
Lymphocytes Relative: 27 %
Lymphs Abs: 2.4 K/uL (ref 0.7–4.0)
MCH: 31.1 pg (ref 26.0–34.0)
MCHC: 33.1 g/dL (ref 30.0–36.0)
MCV: 94.1 fL (ref 80.0–100.0)
Monocytes Absolute: 0.7 K/uL (ref 0.1–1.0)
Monocytes Relative: 8 %
Neutro Abs: 5.3 K/uL (ref 1.7–7.7)
Neutrophils Relative %: 61 %
Platelets: 314 K/uL (ref 150–400)
RBC: 4.56 MIL/uL (ref 3.87–5.11)
RDW: 14 % (ref 11.5–15.5)
WBC: 8.7 K/uL (ref 4.0–10.5)
nRBC: 0 % (ref 0.0–0.2)

## 2024-01-07 LAB — FERRITIN: Ferritin: 90 ng/mL (ref 11–307)

## 2024-01-10 ENCOUNTER — Inpatient Hospital Stay (HOSPITAL_BASED_OUTPATIENT_CLINIC_OR_DEPARTMENT_OTHER): Admitting: Oncology

## 2024-01-10 ENCOUNTER — Encounter: Payer: Self-pay | Admitting: Oncology

## 2024-01-10 ENCOUNTER — Inpatient Hospital Stay

## 2024-01-10 VITALS — BP 140/90 | HR 97 | Temp 98.6°F | Ht 63.0 in | Wt 128.0 lb

## 2024-01-10 DIAGNOSIS — D509 Iron deficiency anemia, unspecified: Secondary | ICD-10-CM

## 2024-01-10 NOTE — Progress Notes (Signed)
 Patient is fixing to have her total knee replacement surgery, so her surgeon would like for ferritin numbers to be better by her surgery.

## 2024-01-10 NOTE — Progress Notes (Signed)
 [facility]  Telephone:([phone] [phone]  ID: Megan [name] OB: 02/03/1961  MR#: [id number]  RDW#:[id number]  Patient Care Team: Epifanio Alm SQUIBB, MD as PCP - General (Infectious Diseases) Jacobo Evalene PARAS, MD as Consulting Physician (Oncology)  CHIEF COMPLAINT: Iron  deficiency anemia.  INTERVAL HISTORY: Patient returns to clinic today for repeat laboratory, further evaluation, and consideration of additional IV Venofer .  She continues to feel well and remains asymptomatic.  She does not complain of any weakness or fatigue.  She has right knee pain and is having joint replacement surgery later this week.  She has no neurologic complaints.  She denies any recent fevers or illnesses.  She has a good appetite and denies weight loss.  She has no chest pain, shortness of breath, cough, or hemoptysis.  She denies any nausea, vomiting, constipation, or diarrhea.  She has no melena or hematochezia.  She has no urinary complaints.  Patient offers no further specific complaints today.  REVIEW OF SYSTEMS:   Review of Systems  Constitutional: Negative.  Negative for fever, malaise/fatigue and weight loss.  Respiratory: Negative.  Negative for cough, hemoptysis and shortness of breath.   Cardiovascular: Negative.  Negative for chest pain and leg swelling.  Gastrointestinal:  Negative for abdominal pain, blood in stool and melena.  Genitourinary: Negative.  Negative for hematuria.  Musculoskeletal:  Positive for joint pain. Negative for back pain.  Skin: Negative.  Negative for rash.  Neurological: Negative.  Negative for dizziness, focal weakness, weakness and headaches.  Psychiatric/Behavioral:  The patient is not nervous/anxious.     As per HPI. Otherwise, a complete review of systems is negative.  PAST MEDICAL HISTORY: Past Medical History:  Diagnosis Date   Anemia    Arthritis    Asthma    CSR (central serous retinopathy), left    DDD  (degenerative disc disease), lumbosacral    DVT (deep venous thrombosis) (HCC) 2008   left femeral s/p femeral osteotomy   Dyspnea    Facet syndrome, lumbar    Family history of adverse reaction to anesthesia    mother had reaction but doesn't know what it is   GERD (gastroesophageal reflux disease)    Heart murmur    History of kidney stones    Hypertension    Hypothyroidism    IDA (iron  deficiency anemia)    Left nephrolithiasis    Lumbar foraminal stenosis    Malfunction of spinal cord stimulator, sequela    Medullary sponge kidney    Pneumonia    Polycystic ovary    PONV (postoperative nausea and vomiting)    Primary osteoarthritis of left knee    Pseudophakia of both eyes    Renal disorder    Thyroid disease    Trigeminal neuralgia     PAST SURGICAL HISTORY: Past Surgical History:  Procedure Laterality Date   ABDOMINAL HYSTERECTOMY     APPENDECTOMY  1980   CATARACT EXTRACTION Bilateral    COLONOSCOPY N/A 10/15/2023   Procedure: COLONOSCOPY;  Surgeon: Onita Elspeth Sharper, DO;  Location: [facility] ENDOSCOPY;  Service: Gastroenterology;  Laterality: N/A;  Pain syndrome, chronic  Spinal cord stimulator status   CYSTOSCOPY/URETEROSCOPY/HOLMIUM LASER/STENT PLACEMENT Right 06/29/2019   Procedure: CYSTOSCOPY/URETEROSCOPY/HOLMIUM LASER/STENT PLACEMENT;  Surgeon: Penne Knee, MD;  Location: [facility] ORS;  Service: Urology;  Laterality: Right;   CYSTOSCOPY/URETEROSCOPY/HOLMIUM LASER/STENT PLACEMENT  07/15/2020   CYSTOSCOPY/URETEROSCOPY/HOLMIUM LASER/STENT PLACEMENT Bilateral 12/01/2021   Procedure: CYSTOSCOPY/URETEROSCOPY/HOLMIUM LASER/STENT PLACEMENT;  Surgeon: Penne Knee, MD;  Location: [facility] ORS;  Service: Urology;  Laterality: Bilateral;   CYSTOSCOPY/URETEROSCOPY/HOLMIUM LASER/STENT PLACEMENT Left 11/25/2023   Procedure: CYSTOSCOPY/URETEROSCOPY/HOLMIUM LASER/STENT PLACEMENT;  Surgeon: Twylla Glendia BROCKS, MD;  Location: [facility] ORS;  Service: Urology;  Laterality: Left;    ESOPHAGOGASTRODUODENOSCOPY     2021, 08/16/2020, 11/13/2020   ESOPHAGOGASTRODUODENOSCOPY N/A 10/15/2023   Procedure: EGD (ESOPHAGOGASTRODUODENOSCOPY);  Surgeon: Onita Elspeth Sharper, DO;  Location: [facility] ENDOSCOPY;  Service: Gastroenterology;  Laterality: N/A;   FEMORAL OSTEOTOMY W/ APPLICATION EXTERNAL FIXATOR Right    HEMOSTASIS CLIP PLACEMENT  10/15/2023   Procedure: CONTROL OF HEMORRHAGE, GI TRACT, ENDOSCOPIC, BY CLIPPING OR OVERSEWING;  Surgeon: Onita Elspeth Sharper, DO;  Location: [facility] ENDOSCOPY;  Service: Gastroenterology;;   KNEE ARTHROSCOPY W/ ACL RECONSTRUCTION Left    KNEE ARTHROSCOPY W/ HARDWARE REMOVAL Right 01/13/2017   LAPAROSCOPIC GASTRIC BYPASS  02/12/2020   LITHOTRIPSY     LUMBAR LAMINECTOMY/DECOMPRESSION MICRODISCECTOMY Right 01/22/2021   Procedure: L4-5 DECOMPRESSION;  Surgeon: Clois Fret, MD;  Location: [facility] ORS;  Service: Neurosurgery;  Laterality: Right;   LUMBAR SPINAL CORD SIMULATOR REVISION N/A 12/02/2022   Procedure: REVISION OF SPINAL CORD STIMULATOR ([employer]);  Surgeon: Clois Fret, MD;  Location: [facility] ORS;  Service: Neurosurgery;  Laterality: N/A;   OSTEOTOMY Bilateral 2017   femoral shaft/supracondylar   POLYPECTOMY  10/15/2023   Procedure: POLYPECTOMY, INTESTINE;  Surgeon: Onita Elspeth Sharper, DO;  Location: [facility] ENDOSCOPY;  Service: Gastroenterology;;   PULSE GENERATOR IMPLANT N/A 09/03/2021   Procedure: PULSE GENERATOR IMPLANT;  Surgeon: Clois Fret, MD;  Location: [facility] ORS;  Service: Neurosurgery;  Laterality: N/A;   ROTATOR CUFF REPAIR Right    THORACIC LAMINECTOMY FOR SPINAL CORD STIMULATOR N/A 09/03/2021   Procedure: THORACIC LAMINECTOMY FOR SPINAL CORD STIMULATOR PLACEMENT ([employer]);  Surgeon: Clois Fret, MD;  Location: [facility] ORS;  Service: Neurosurgery;  Laterality: N/A;   TONSILLECTOMY  1964   TOTAL KNEE ARTHROPLASTY Right 11/11/2018   total knee arthroplasty,      FAMILY HISTORY: History reviewed.  No pertinent family history.  ADVANCED DIRECTIVES (Y/N):  N  HEALTH MAINTENANCE: Social History   Tobacco Use   Smoking status: Never   Smokeless tobacco: Never  Vaping Use   Vaping status: Never Used  Substance Use Topics   Alcohol use: Not Currently   Drug use: Never     Colonoscopy:  PAP:  Bone density:  Lipid panel:  Allergies  Allergen Reactions   Hydrocodone-Acetaminophen  Shortness Of Breath    Headache, Vomiting, nightmares. Pt is able to take acetaminophen .     Latex Rash and Other (See Comments)    Blisters      Morphine  Anaphylaxis, Shortness Of Breath, Nausea And Vomiting, Swelling and Other (See Comments)    Has and facial swelling    Nickel Swelling and Rash   Petrolatum Rash and Other (See Comments)    Blister All petroleum products per patient, facial swelling    Tape Rash    Tolerates paper tape   Tegaderm Ag Mesh [Silver] Rash   Erythromycin Nausea And Vomiting   Hydromorphone  Hcl Nausea And Vomiting    Bad headache   Nsaids     Avoid due to bariatric surgery   Other Other (See Comments)    Generic medications per patient- redness and burning in gums    Silicone Dermatitis    Including Tegraderm- Rash & Blisters,   Prefers paper tape   Sulfa Antibiotics Rash         Current Outpatient Medications  Medication Sig Dispense Refill   acetaminophen  (TYLENOL ) 500 MG tablet Take  500 mg by mouth every 6 (six) hours as needed for mild pain (pain score 1-3).     albuterol  (VENTOLIN  HFA) 108 (90 Base) MCG/ACT inhaler Inhale 1-2 puffs into the lungs every 6 (six) hours as needed for wheezing or shortness of breath.     budesonide  (PULMICORT ) 0.5 MG/2ML nebulizer solution Take 0.5 mg by nebulization daily as needed (shortness of breath).     carbamazepine  (TEGRETOL ) 200 MG tablet Take 200 mg by mouth in the morning and at bedtime.     cyanocobalamin  (VITAMIN B12) 1000 MCG tablet Take 1,000 mcg by mouth daily.     fluticasone  (FLONASE ) 50 MCG/ACT  nasal spray Place 2 sprays into both nostrils daily.     gabapentin  (NEURONTIN ) 300 MG capsule Take 300 mg by mouth 3 (three) times daily.     levothyroxine  (SYNTHROID ) 50 MCG tablet Take 50 mcg by mouth daily before breakfast.     losartan  (COZAAR ) 100 MG tablet Take 75 mg by mouth at bedtime.     montelukast  (SINGULAIR ) 10 MG tablet Take 10 mg by mouth at bedtime.     Multiple Vitamins-Minerals (BARIATRIC MULTIVITAMINS PO) Take 1 tablet by mouth daily.     pantoprazole  (PROTONIX ) 40 MG tablet Take 40 mg by mouth 3 (three) times daily with meals.     phenazopyridine  (PYRIDIUM ) 200 MG tablet Take 1 tablet (200 mg total) by mouth 3 (three) times daily as needed for pain. 10 tablet 0   TRELEGY ELLIPTA 200-62.5-25 MCG/ACT AEPB Inhale 1 puff into the lungs daily as needed (shortness of breath during high pollen season).     VITAMIN D -VITAMIN K PO Take 1 tablet by mouth daily.     No current facility-administered medications for this visit.    OBJECTIVE: Vitals:   01/10/24 0949  BP: (!) 140/90  Pulse: 97  Temp: 98.6 F (37 C)  SpO2: 100%     Body mass index is 22.67 kg/m.    ECOG FS:0 - Asymptomatic  General: Well-developed, well-nourished, no acute distress. Eyes: Pink conjunctiva, anicteric sclera. HEENT: Normocephalic, moist mucous membranes. Lungs: No audible wheezing or coughing. Heart: Regular rate and rhythm. Abdomen: Soft, nontender, no obvious distention. Musculoskeletal: No edema, cyanosis, or clubbing. Neuro: Alert, answering all questions appropriately. Cranial nerves grossly intact. Skin: No rashes or petechiae noted. Psych: Normal affect.   LAB RESULTS:  Lab Results  Component Value Date   NA 141 08/22/2021   K 4.2 08/22/2021   CL 105 08/22/2021   CO2 27 08/22/2021   GLUCOSE 96 08/22/2021   BUN 16 08/22/2021   CREATININE 0.68 12/03/2022   CALCIUM 9.5 08/22/2021   PROT 7.2 12/09/2020   ALBUMIN 4.1 12/09/2020   AST 21 12/09/2020   ALT 16 12/09/2020    ALKPHOS 82 12/09/2020   BILITOT 0.4 12/09/2020   GFRNONAA >60 12/03/2022   GFRAA >60 01/02/2020    Lab Results  Component Value Date   WBC 8.7 01/07/2024   NEUTROABS 5.3 01/07/2024   HGB 14.2 01/07/2024   HCT 42.9 01/07/2024   MCV 94.1 01/07/2024   PLT 314 01/07/2024   Lab Results  Component Value Date   IRON  181 (H) 01/07/2024   TIBC 427 01/07/2024   IRONPCTSAT 42 (H) 01/07/2024   Lab Results  Component Value Date   FERRITIN 90 01/07/2024     STUDIES: No results found.   ASSESSMENT: Iron  deficiency anemia.  PLAN:    Iron  deficiency anemia: Possibly secondary to poor absorption from patient's history of  Roux-en-Y gastric bypass.  Patient's hemoglobin and iron  stores are now within normal limits.  Previously, all of her laboratory work was also either negative or within normal limits.  She does not require additional IV Venofer  today.  Patient last received treatment on September 27, 2023.  Return to clinic in 4 months with repeat laboratory work, further evaluation, and continuation of treatment if needed.  Joint pain: Patient is undergoing repeat knee replacement surgery later this week.   Hypertension: Chronic and unchanged.  Patient's blood pressure is mildly elevated today.  Continue monitoring and treatment per primary care.   Patient expressed understanding and was in agreement with this plan. She also understands that She can call clinic at any time with any questions, concerns, or complaints.     Evalene JINNY Reusing, MD   01/10/2024 9:56 AM

## 2024-01-11 ENCOUNTER — Ambulatory Visit: Payer: Self-pay | Admitting: Urology

## 2024-03-23 ENCOUNTER — Other Ambulatory Visit: Payer: Self-pay | Admitting: Anesthesiology

## 2024-03-23 DIAGNOSIS — G894 Chronic pain syndrome: Secondary | ICD-10-CM

## 2024-03-23 DIAGNOSIS — M961 Postlaminectomy syndrome, not elsewhere classified: Secondary | ICD-10-CM

## 2024-04-06 ENCOUNTER — Ambulatory Visit (HOSPITAL_COMMUNITY)

## 2024-04-17 ENCOUNTER — Ambulatory Visit (HOSPITAL_COMMUNITY)
Admission: RE | Admit: 2024-04-17 | Discharge: 2024-04-17 | Disposition: A | Discharge status: Home / Self Care | Source: Ambulatory Visit | Attending: Anesthesiology | Admitting: Anesthesiology

## 2024-04-17 DIAGNOSIS — G894 Chronic pain syndrome: Secondary | ICD-10-CM | POA: Diagnosis not present

## 2024-04-17 DIAGNOSIS — Z9682 Presence of neurostimulator: Secondary | ICD-10-CM

## 2024-04-17 DIAGNOSIS — M961 Postlaminectomy syndrome, not elsewhere classified: Secondary | ICD-10-CM | POA: Diagnosis present

## 2024-05-11 ENCOUNTER — Inpatient Hospital Stay: Attending: Oncology

## 2024-05-15 ENCOUNTER — Other Ambulatory Visit

## 2024-05-16 ENCOUNTER — Inpatient Hospital Stay: Admitting: Oncology

## 2024-05-16 ENCOUNTER — Inpatient Hospital Stay

## 2024-12-29 ENCOUNTER — Ambulatory Visit: Admitting: Urology
